# Patient Record
Sex: Male | Born: 1940
Health system: Southern US, Community
[De-identification: ages and names within clinical notes are randomized; demographics above are authoritative.]

## PROBLEM LIST (undated history)

## (undated) DIAGNOSIS — J302 Other seasonal allergic rhinitis: Secondary | ICD-10-CM

## (undated) DIAGNOSIS — K649 Unspecified hemorrhoids: Secondary | ICD-10-CM

## (undated) DIAGNOSIS — J301 Allergic rhinitis due to pollen: Secondary | ICD-10-CM

## (undated) DIAGNOSIS — Z87442 Personal history of urinary calculi: Secondary | ICD-10-CM

## (undated) DIAGNOSIS — M79643 Pain in unspecified hand: Secondary | ICD-10-CM

## (undated) DIAGNOSIS — E559 Vitamin D deficiency, unspecified: Secondary | ICD-10-CM

## (undated) DIAGNOSIS — T63441A Toxic effect of venom of bees, accidental (unintentional), initial encounter: Secondary | ICD-10-CM

## (undated) DIAGNOSIS — K219 Gastro-esophageal reflux disease without esophagitis: Secondary | ICD-10-CM

## (undated) DIAGNOSIS — L723 Sebaceous cyst: Secondary | ICD-10-CM

## (undated) DIAGNOSIS — H26229 Cataract secondary to ocular disorders (degenerative) (inflammatory), unspecified eye: Secondary | ICD-10-CM

## (undated) DIAGNOSIS — N138 Other obstructive and reflux uropathy: Secondary | ICD-10-CM

## (undated) DIAGNOSIS — N529 Male erectile dysfunction, unspecified: Secondary | ICD-10-CM

## (undated) DIAGNOSIS — I700 Atherosclerosis of aorta: Secondary | ICD-10-CM

## (undated) DIAGNOSIS — Z8709 Personal history of other diseases of the respiratory system: Secondary | ICD-10-CM

## (undated) DIAGNOSIS — Z8349 Family history of other endocrine, nutritional and metabolic diseases: Secondary | ICD-10-CM

## (undated) DIAGNOSIS — N400 Enlarged prostate without lower urinary tract symptoms: Secondary | ICD-10-CM

## (undated) DIAGNOSIS — I10 Essential (primary) hypertension: Secondary | ICD-10-CM

## (undated) DIAGNOSIS — Z79899 Other long term (current) drug therapy: Secondary | ICD-10-CM

## (undated) DIAGNOSIS — M5136 Other intervertebral disc degeneration, lumbar region: Secondary | ICD-10-CM

## (undated) DIAGNOSIS — R413 Other amnesia: Secondary | ICD-10-CM

## (undated) DIAGNOSIS — E291 Testicular hypofunction: Secondary | ICD-10-CM

## (undated) DIAGNOSIS — H3530 Unspecified macular degeneration: Secondary | ICD-10-CM

## (undated) DIAGNOSIS — R7301 Impaired fasting glucose: Secondary | ICD-10-CM

## (undated) DIAGNOSIS — E538 Deficiency of other specified B group vitamins: Secondary | ICD-10-CM

## (undated) DIAGNOSIS — Z973 Presence of spectacles and contact lenses: Secondary | ICD-10-CM

## (undated) DIAGNOSIS — E782 Mixed hyperlipidemia: Secondary | ICD-10-CM

## (undated) DIAGNOSIS — K5900 Constipation, unspecified: Secondary | ICD-10-CM

## (undated) DIAGNOSIS — B351 Tinea unguium: Secondary | ICD-10-CM

## (undated) DIAGNOSIS — R5383 Other fatigue: Secondary | ICD-10-CM

## (undated) DIAGNOSIS — M179 Osteoarthritis of knee, unspecified: Secondary | ICD-10-CM

## (undated) DIAGNOSIS — J309 Allergic rhinitis, unspecified: Secondary | ICD-10-CM

## (undated) DIAGNOSIS — Z87898 Personal history of other specified conditions: Secondary | ICD-10-CM

## (undated) DIAGNOSIS — R531 Weakness: Secondary | ICD-10-CM

## (undated) DIAGNOSIS — M1990 Unspecified osteoarthritis, unspecified site: Secondary | ICD-10-CM

## (undated) DIAGNOSIS — N200 Calculus of kidney: Secondary | ICD-10-CM

## (undated) DIAGNOSIS — R258 Other abnormal involuntary movements: Secondary | ICD-10-CM

## (undated) DIAGNOSIS — R109 Unspecified abdominal pain: Secondary | ICD-10-CM

## (undated) DIAGNOSIS — R55 Syncope and collapse: Secondary | ICD-10-CM

## (undated) DIAGNOSIS — M4722 Other spondylosis with radiculopathy, cervical region: Secondary | ICD-10-CM

## (undated) DIAGNOSIS — J329 Chronic sinusitis, unspecified: Secondary | ICD-10-CM

## (undated) DIAGNOSIS — R739 Hyperglycemia, unspecified: Secondary | ICD-10-CM

## (undated) DIAGNOSIS — T63461A Toxic effect of venom of wasps, accidental (unintentional), initial encounter: Secondary | ICD-10-CM

## (undated) DIAGNOSIS — E7800 Pure hypercholesterolemia, unspecified: Secondary | ICD-10-CM

## (undated) DIAGNOSIS — H04129 Dry eye syndrome of unspecified lacrimal gland: Secondary | ICD-10-CM

## (undated) DIAGNOSIS — R29898 Other symptoms and signs involving the musculoskeletal system: Secondary | ICD-10-CM

## (undated) DIAGNOSIS — M25519 Pain in unspecified shoulder: Secondary | ICD-10-CM

## (undated) DIAGNOSIS — H409 Unspecified glaucoma: Secondary | ICD-10-CM

## (undated) DIAGNOSIS — J029 Acute pharyngitis, unspecified: Secondary | ICD-10-CM

## (undated) DIAGNOSIS — G8929 Other chronic pain: Secondary | ICD-10-CM

## (undated) DIAGNOSIS — E785 Hyperlipidemia, unspecified: Secondary | ICD-10-CM

## (undated) DIAGNOSIS — Z0000 Encounter for general adult medical examination without abnormal findings: Secondary | ICD-10-CM

## (undated) HISTORY — DX: Osteoarthritis of knee, unspecified: M17.9

## (undated) HISTORY — DX: Other long term (current) drug therapy: Z79.899

## (undated) HISTORY — DX: Acute pharyngitis, unspecified: J02.9

## (undated) HISTORY — PX: CYSTOSCOPY WITH INSERTION OF UROLIFT: SHX6678

## (undated) HISTORY — DX: Essential (primary) hypertension: I10

## (undated) HISTORY — DX: Chronic sinusitis, unspecified: J32.9

## (undated) HISTORY — DX: Mixed hyperlipidemia: E78.2

## (undated) HISTORY — DX: Other spondylosis with radiculopathy, cervical region: M47.22

## (undated) HISTORY — DX: Other symptoms and signs involving the musculoskeletal system: R29.898

## (undated) HISTORY — DX: Unspecified glaucoma: H40.9

## (undated) HISTORY — DX: Other obstructive and reflux uropathy: N13.8

## (undated) HISTORY — DX: Hyperglycemia, unspecified: R73.9

## (undated) HISTORY — PX: CATARACT EXTRACTION W/ INTRAOCULAR LENS  IMPLANT, BILATERAL: SHX1307

## (undated) HISTORY — DX: Impaired fasting glucose: R73.01

## (undated) HISTORY — DX: Pain in unspecified hand: M79.643

## (undated) HISTORY — DX: Other fatigue: R53.83

## (undated) HISTORY — DX: Family history of other endocrine, nutritional and metabolic diseases: Z83.49

## (undated) HISTORY — PX: APPENDECTOMY: SHX54

## (undated) HISTORY — DX: Constipation, unspecified: K59.00

## (undated) HISTORY — PX: ANKLE SURGERY: SHX546

## (undated) HISTORY — DX: Allergic rhinitis, unspecified: J30.9

## (undated) HISTORY — DX: Testicular hypofunction: E29.1

## (undated) HISTORY — PX: TONSILLECTOMY: SUR1361

## (undated) HISTORY — DX: Toxic effect of venom of bees, accidental (unintentional), initial encounter: T63.441A

## (undated) HISTORY — DX: Toxic effect of venom of wasps, accidental (unintentional), initial encounter: T63.461A

## (undated) HISTORY — DX: Gastro-esophageal reflux disease without esophagitis: K21.9

## (undated) HISTORY — PX: SHOULDER SURGERY: SHX246

## (undated) HISTORY — DX: Atherosclerosis of aorta: I70.0

## (undated) HISTORY — DX: Encounter for general adult medical examination without abnormal findings: Z00.00

## (undated) HISTORY — DX: Pure hypercholesterolemia, unspecified: E78.00

## (undated) HISTORY — DX: Deficiency of other specified B group vitamins: E53.8

## (undated) HISTORY — DX: Male erectile dysfunction, unspecified: N52.9

## (undated) HISTORY — DX: Cataract secondary to ocular disorders (degenerative) (inflammatory), unspecified eye: H26.229

## (undated) HISTORY — DX: Allergic rhinitis due to pollen: J30.1

## (undated) HISTORY — DX: Unspecified abdominal pain: R10.9

## (undated) HISTORY — DX: Syncope and collapse: R55

## (undated) HISTORY — DX: Pain in unspecified shoulder: M25.519

## (undated) HISTORY — DX: Other abnormal involuntary movements: R25.8

## (undated) HISTORY — PX: CATARACT EXTRACTION, BILATERAL: SHX1313

## (undated) HISTORY — PX: PROSTATE SURGERY: SHX751

## (undated) HISTORY — DX: Unspecified osteoarthritis, unspecified site: M19.90

## (undated) HISTORY — DX: Other amnesia: R41.3

## (undated) HISTORY — DX: Vitamin D deficiency, unspecified: E55.9

## (undated) HISTORY — DX: Unspecified hemorrhoids: K64.9

## (undated) HISTORY — DX: Tinea unguium: B35.1

## (undated) HISTORY — DX: Benign prostatic hyperplasia without lower urinary tract symptoms: N40.0

## (undated) HISTORY — DX: Sebaceous cyst: L72.3

## (undated) HISTORY — DX: Calculus of kidney: N20.0

---
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
## 1998-01-27 ENCOUNTER — Ambulatory Visit (HOSPITAL_COMMUNITY): Admission: RE | Admit: 1998-01-27 | Discharge: 1998-01-27 | Payer: Self-pay | Admitting: Internal Medicine

## 2001-07-18 ENCOUNTER — Encounter: Payer: Self-pay | Admitting: Internal Medicine

## 2001-07-18 ENCOUNTER — Encounter: Admission: RE | Admit: 2001-07-18 | Discharge: 2001-07-18 | Payer: Self-pay | Admitting: Internal Medicine

## 2001-10-22 ENCOUNTER — Encounter: Admission: RE | Admit: 2001-10-22 | Discharge: 2001-10-22 | Payer: Self-pay | Admitting: Otolaryngology

## 2001-10-22 ENCOUNTER — Encounter: Payer: Self-pay | Admitting: Otolaryngology

## 2002-06-13 ENCOUNTER — Ambulatory Visit (HOSPITAL_COMMUNITY): Admission: RE | Admit: 2002-06-13 | Discharge: 2002-06-13 | Payer: Self-pay | Admitting: *Deleted

## 2002-06-13 ENCOUNTER — Encounter (INDEPENDENT_AMBULATORY_CARE_PROVIDER_SITE_OTHER): Payer: Self-pay | Admitting: Specialist

## 2003-07-23 ENCOUNTER — Ambulatory Visit (HOSPITAL_COMMUNITY): Admission: RE | Admit: 2003-07-23 | Discharge: 2003-07-23 | Payer: Self-pay | Admitting: Internal Medicine

## 2005-10-03 ENCOUNTER — Ambulatory Visit (HOSPITAL_COMMUNITY): Admission: RE | Admit: 2005-10-03 | Discharge: 2005-10-04 | Payer: Self-pay | Admitting: Orthopedic Surgery

## 2009-11-18 ENCOUNTER — Ambulatory Visit (HOSPITAL_COMMUNITY): Admission: RE | Admit: 2009-11-18 | Discharge: 2009-11-18 | Payer: Self-pay | Admitting: Gastroenterology

## 2010-12-09 NOTE — Op Note (Signed)
   NAME:  Seth Carter, Seth Carter                            ACCOUNT NO.:  [id number]   MEDICAL RECORD NO.:  [id number]                   PATIENT TYPE:  AMB   LOCATION:  ENDO                                 FACILITY:  [facility]   PHYSICIAN:  Georgiana Spinner, M.D.                 DATE OF BIRTH:  1941-01-16   DATE OF PROCEDURE:  06/13/2002  DATE OF DISCHARGE:                                 OPERATIVE REPORT   PROCEDURE:  Upper endoscopy.   INDICATIONS:  GERD.   ANESTHESIA:  Demerol 70 mg, Versed 6 mg.   DESCRIPTION OF PROCEDURE:  With the patient mildly sedated in the left  lateral decubitus position, the Olympus videoscopic endoscope was inserted  in the mouth, passed under direct vision through the esophagus, which  appeared normal, into the stomach.  Fundus, body, antrum, duodenal bulb,  second portion of the duodenum all appeared normal.  From this point the  endoscope was slowly withdrawn, taking circumferential views of the entire  duodenal mucosa until the endoscope had been pulled back into the stomach,  placed in retroflexion to view the stomach from below.  The endoscope was  then straightened and withdrawn, taking circumferential views of the entire  gastric and esophageal mucosa.  The patient's vital signs and pulse oximetry  remained stable.  The patient tolerated the procedure well without apparent  complications.   FINDINGS:  Essentially negative endoscopic examination.  The patient still  complains of reflux symptomatology on Protonix, started by Dr. Kevan Ny.   PLAN:  I will see him as an outpatient for follow-up of this problem and  proceed to colonoscopy as planned.                                               Georgiana Spinner, M.D.    GMO/MEDQ  D:  06/13/2002  T:  06/13/2002  Job:  [id number]   cc:   Candyce Churn, M.D.  [address] [place]  [place] [address]  Fax: [phone]

## 2010-12-09 NOTE — Op Note (Signed)
NAME:  Seth Carter, Seth Carter                  ACCOUNT NO.:  [id number]   MEDICAL RECORD NO.:  [id number]          PATIENT TYPE:  AMB   LOCATION:  DAY                          FACILITY:  [facility]   PHYSICIAN:  Marlowe Kays, M.D.  DATE OF BIRTH:  08-19-1940   DATE OF PROCEDURE:  10/03/2005  DATE OF DISCHARGE:                                 OPERATIVE REPORT   PREOPERATIVE DIAGNOSIS:  Chronic Achilles tendonitis secondary to posterior  os calcis osteophyte, right heel.   POSTOP DIAGNOSIS:  Chronic Achilles tendonitis secondary to posterior os  calcis osteophyte, right heel.   OPERATION:  Exploration of right posterior heel with excision of osteophyte,  debridement of Achilles tendon, and reattachment to the heel using a rotator  cuff anchor, utilizing C-arm.   SURGEON:  Marlowe Kays, M.D.   ASSISTANT:  Nurse.   ANESTHESIA:  General.   PATHOLOGIST:  __________   INDICATIONS FOR PROCEDURE:  Chronic pain and swelling of the posterior heel  with an MRI demonstrating chronic tendinopathy with some small  intrasubstance tears near the attachment site. Also edema was noted in the  surrounding calcaneus. All this was confirmed at surgery.   PROCEDURE:  Satisfactory general anesthesia, pneumatic tourniquet, right  lower extremity was Esmarched out nonsterilely; and prepped with DuraPrep  from midcalf-to-toes, and draped in a sterile field. I made a posterolateral  incision down through the Achilles tendon at that site; and with  localization using the C-arm was able to localize the major osteophyte which  was angled upward and digging into the Achilles tendon. There was a good bit  of irritative fluid present. I excised the osteophyte mainly with some small  rongeur, but slight with osteotome; and utilizing C-arm, kept working until  it all had been removed. The attachment to the Achilles tendon was somewhat  tenuous with probably partial detachment and the Achilles tendon, at this  site had  bone imbedded in it, and was very degenerated. All this diseased  tendon was debrided out. When I was satisfied, using the C-arm, that we had  removed all the abnormal bone; I reattached the Achilles tendon with a  rotator cuff anchor with the two suture arms woven through the tendon and  cinched down tightly.  I took a final lateral x-ray confirming the  reattachment and the excision of the osteophyte.  I supplemented the anchor  suture with some #1 Vicryl. The wound was irrigated well with sterile  saline, infiltrated with 1/2% plain Marcaine; and the skin and subcutaneous  tissue closed with interrupted 4-0 nylon  mattress sutures. Betadine, Adaptic, dry sterile dressing, and a well-padded  short-leg splint cast was applied. The tourniquet was released. He tolerated  the procedure well and was taken to the recovery room in satisfactory  condition with no known complications.           ______________________________  Marlowe Kays, M.D.     JA/MEDQ  D:  10/03/2005  T:  10/04/2005  Job:  [id number]

## 2010-12-09 NOTE — Op Note (Signed)
   NAME:  Seth Carter, Seth Carter                            ACCOUNT NO.:  [id number]   MEDICAL RECORD NO.:  [id number]                   PATIENT TYPE:  AMB   LOCATION:  ENDO                                 FACILITY:  [facility]   PHYSICIAN:  Georgiana Spinner, M.D.                 DATE OF BIRTH:  09/03/1940   DATE OF PROCEDURE:  DATE OF DISCHARGE:                                 OPERATIVE REPORT   PROCEDURE:  Colonoscopy.   INDICATIONS FOR PROCEDURE:  History of rectal bleeding, colon cancer  screening.   ANESTHESIA:  Demerol 30, Versed 4 mg additionally.   DESCRIPTION OF PROCEDURE:  With the patient mildly sedated in the left  lateral decubitus position, a rectal exam was performed which was  unremarkable. Subsequently, the Olympus videoscopic colonoscope was inserted  in the rectum and passed under direct vision to the cecum identified by the  ileocecal valve and appendiceal orifice the former of which was  photographed. From this point, the colonoscope was slowly withdrawn taking  circumferential views of the entire colonic mucosa stopping in the ascending  colon on the way to the rectum where there was a small polypoid area seen,  photographed and removed using hot biopsy forceps technique on a setting of  20:20 blended current. We further withdrew the colonoscope all the way to  the rectum taking circumferential views of the remaining colonic mucosa  stopping only then in the rectum which appeared normal in direct and  retroflexed view and showed hemorrhoids.  The endoscope was straightened and  withdrawn. The patient's vital signs and pulse oximeter remained stable. The  patient tolerated the procedure well without apparent complications.   FINDINGS:  Small polypoid area in the ascending colon removed, internal  hemorrhoids, otherwise, unremarkable examination.   PLAN:  Await biopsy report. The patient will call me for results and  followup with me as described above and the endoscopy  note.                                                Georgiana Spinner, M.D.    GMO/MEDQ  D:  06/13/2002  T:  06/13/2002  Job:  [id number]   cc:   Candyce Churn, M.D.  [address] [place]  [place] [address]  Fax: [phone]

## 2011-10-07 DIAGNOSIS — J019 Acute sinusitis, unspecified: Secondary | ICD-10-CM | POA: Diagnosis not present

## 2011-10-07 DIAGNOSIS — R030 Elevated blood-pressure reading, without diagnosis of hypertension: Secondary | ICD-10-CM | POA: Diagnosis not present

## 2011-10-16 DIAGNOSIS — J029 Acute pharyngitis, unspecified: Secondary | ICD-10-CM | POA: Diagnosis not present

## 2011-10-16 DIAGNOSIS — K219 Gastro-esophageal reflux disease without esophagitis: Secondary | ICD-10-CM | POA: Diagnosis not present

## 2011-11-06 DIAGNOSIS — E559 Vitamin D deficiency, unspecified: Secondary | ICD-10-CM | POA: Diagnosis not present

## 2011-11-06 DIAGNOSIS — I10 Essential (primary) hypertension: Secondary | ICD-10-CM | POA: Diagnosis not present

## 2011-11-06 DIAGNOSIS — N529 Male erectile dysfunction, unspecified: Secondary | ICD-10-CM | POA: Diagnosis not present

## 2011-11-06 DIAGNOSIS — Z79899 Other long term (current) drug therapy: Secondary | ICD-10-CM | POA: Diagnosis not present

## 2011-11-06 DIAGNOSIS — Z0000 Encounter for general adult medical examination without abnormal findings: Secondary | ICD-10-CM | POA: Diagnosis not present

## 2011-11-06 DIAGNOSIS — Z1331 Encounter for screening for depression: Secondary | ICD-10-CM | POA: Diagnosis not present

## 2011-11-06 DIAGNOSIS — IMO0002 Reserved for concepts with insufficient information to code with codable children: Secondary | ICD-10-CM | POA: Diagnosis not present

## 2011-11-06 DIAGNOSIS — N400 Enlarged prostate without lower urinary tract symptoms: Secondary | ICD-10-CM | POA: Diagnosis not present

## 2011-11-06 DIAGNOSIS — M1710 Unilateral primary osteoarthritis, unspecified knee: Secondary | ICD-10-CM | POA: Diagnosis not present

## 2011-11-06 DIAGNOSIS — E291 Testicular hypofunction: Secondary | ICD-10-CM | POA: Diagnosis not present

## 2011-11-06 DIAGNOSIS — R7301 Impaired fasting glucose: Secondary | ICD-10-CM | POA: Diagnosis not present

## 2012-01-01 DIAGNOSIS — T700XXA Otitic barotrauma, initial encounter: Secondary | ICD-10-CM | POA: Diagnosis not present

## 2012-01-03 DIAGNOSIS — T6391XA Toxic effect of contact with unspecified venomous animal, accidental (unintentional), initial encounter: Secondary | ICD-10-CM | POA: Diagnosis not present

## 2012-01-03 DIAGNOSIS — D235 Other benign neoplasm of skin of trunk: Secondary | ICD-10-CM | POA: Diagnosis not present

## 2012-01-03 DIAGNOSIS — H6500 Acute serous otitis media, unspecified ear: Secondary | ICD-10-CM | POA: Diagnosis not present

## 2012-01-03 DIAGNOSIS — H902 Conductive hearing loss, unspecified: Secondary | ICD-10-CM | POA: Diagnosis not present

## 2012-01-03 DIAGNOSIS — L820 Inflamed seborrheic keratosis: Secondary | ICD-10-CM | POA: Diagnosis not present

## 2012-05-30 DIAGNOSIS — H2510 Age-related nuclear cataract, unspecified eye: Secondary | ICD-10-CM | POA: Diagnosis not present

## 2012-05-30 DIAGNOSIS — Z961 Presence of intraocular lens: Secondary | ICD-10-CM | POA: Diagnosis not present

## 2012-05-30 DIAGNOSIS — H31009 Unspecified chorioretinal scars, unspecified eye: Secondary | ICD-10-CM | POA: Diagnosis not present

## 2012-05-30 DIAGNOSIS — D3130 Benign neoplasm of unspecified choroid: Secondary | ICD-10-CM | POA: Diagnosis not present

## 2012-06-18 DIAGNOSIS — H2510 Age-related nuclear cataract, unspecified eye: Secondary | ICD-10-CM | POA: Diagnosis not present

## 2012-06-24 DIAGNOSIS — H2510 Age-related nuclear cataract, unspecified eye: Secondary | ICD-10-CM | POA: Diagnosis not present

## 2012-08-30 DIAGNOSIS — R059 Cough, unspecified: Secondary | ICD-10-CM | POA: Diagnosis not present

## 2012-08-30 DIAGNOSIS — R05 Cough: Secondary | ICD-10-CM | POA: Diagnosis not present

## 2012-08-30 DIAGNOSIS — J209 Acute bronchitis, unspecified: Secondary | ICD-10-CM | POA: Diagnosis not present

## 2012-11-11 DIAGNOSIS — E291 Testicular hypofunction: Secondary | ICD-10-CM | POA: Diagnosis not present

## 2012-11-11 DIAGNOSIS — I10 Essential (primary) hypertension: Secondary | ICD-10-CM | POA: Diagnosis not present

## 2012-11-11 DIAGNOSIS — Z1331 Encounter for screening for depression: Secondary | ICD-10-CM | POA: Diagnosis not present

## 2012-11-11 DIAGNOSIS — IMO0002 Reserved for concepts with insufficient information to code with codable children: Secondary | ICD-10-CM | POA: Diagnosis not present

## 2012-11-11 DIAGNOSIS — M1710 Unilateral primary osteoarthritis, unspecified knee: Secondary | ICD-10-CM | POA: Diagnosis not present

## 2012-11-11 DIAGNOSIS — E559 Vitamin D deficiency, unspecified: Secondary | ICD-10-CM | POA: Diagnosis not present

## 2012-11-11 DIAGNOSIS — R7301 Impaired fasting glucose: Secondary | ICD-10-CM | POA: Diagnosis not present

## 2012-11-11 DIAGNOSIS — Z79899 Other long term (current) drug therapy: Secondary | ICD-10-CM | POA: Diagnosis not present

## 2012-11-11 DIAGNOSIS — N400 Enlarged prostate without lower urinary tract symptoms: Secondary | ICD-10-CM | POA: Diagnosis not present

## 2012-11-11 DIAGNOSIS — Z0000 Encounter for general adult medical examination without abnormal findings: Secondary | ICD-10-CM | POA: Diagnosis not present

## 2012-11-11 DIAGNOSIS — N529 Male erectile dysfunction, unspecified: Secondary | ICD-10-CM | POA: Diagnosis not present

## 2012-11-11 DIAGNOSIS — E7800 Pure hypercholesterolemia, unspecified: Secondary | ICD-10-CM | POA: Diagnosis not present

## 2012-12-11 DIAGNOSIS — N401 Enlarged prostate with lower urinary tract symptoms: Secondary | ICD-10-CM | POA: Diagnosis not present

## 2012-12-11 DIAGNOSIS — N529 Male erectile dysfunction, unspecified: Secondary | ICD-10-CM | POA: Diagnosis not present

## 2012-12-11 DIAGNOSIS — N138 Other obstructive and reflux uropathy: Secondary | ICD-10-CM | POA: Diagnosis not present

## 2013-01-09 DIAGNOSIS — M5030 Other cervical disc degeneration, unspecified cervical region: Secondary | ICD-10-CM | POA: Diagnosis not present

## 2013-02-04 DIAGNOSIS — R7309 Other abnormal glucose: Secondary | ICD-10-CM | POA: Diagnosis not present

## 2013-05-06 DIAGNOSIS — M5030 Other cervical disc degeneration, unspecified cervical region: Secondary | ICD-10-CM | POA: Diagnosis not present

## 2013-05-13 DIAGNOSIS — M5030 Other cervical disc degeneration, unspecified cervical region: Secondary | ICD-10-CM | POA: Diagnosis not present

## 2013-05-14 DIAGNOSIS — N529 Male erectile dysfunction, unspecified: Secondary | ICD-10-CM | POA: Diagnosis not present

## 2013-05-14 DIAGNOSIS — N138 Other obstructive and reflux uropathy: Secondary | ICD-10-CM | POA: Diagnosis not present

## 2013-05-14 DIAGNOSIS — N401 Enlarged prostate with lower urinary tract symptoms: Secondary | ICD-10-CM | POA: Diagnosis not present

## 2013-05-20 DIAGNOSIS — M5030 Other cervical disc degeneration, unspecified cervical region: Secondary | ICD-10-CM | POA: Diagnosis not present

## 2013-05-26 DIAGNOSIS — N401 Enlarged prostate with lower urinary tract symptoms: Secondary | ICD-10-CM | POA: Diagnosis not present

## 2013-05-26 DIAGNOSIS — N139 Obstructive and reflux uropathy, unspecified: Secondary | ICD-10-CM | POA: Diagnosis not present

## 2013-05-26 DIAGNOSIS — N138 Other obstructive and reflux uropathy: Secondary | ICD-10-CM | POA: Diagnosis not present

## 2013-06-03 DIAGNOSIS — I781 Nevus, non-neoplastic: Secondary | ICD-10-CM | POA: Diagnosis not present

## 2013-06-03 DIAGNOSIS — D1801 Hemangioma of skin and subcutaneous tissue: Secondary | ICD-10-CM | POA: Diagnosis not present

## 2013-06-03 DIAGNOSIS — L723 Sebaceous cyst: Secondary | ICD-10-CM | POA: Diagnosis not present

## 2013-06-03 DIAGNOSIS — D485 Neoplasm of uncertain behavior of skin: Secondary | ICD-10-CM | POA: Diagnosis not present

## 2013-06-03 DIAGNOSIS — L820 Inflamed seborrheic keratosis: Secondary | ICD-10-CM | POA: Diagnosis not present

## 2013-06-03 DIAGNOSIS — D235 Other benign neoplasm of skin of trunk: Secondary | ICD-10-CM | POA: Diagnosis not present

## 2013-06-23 DIAGNOSIS — N138 Other obstructive and reflux uropathy: Secondary | ICD-10-CM | POA: Diagnosis not present

## 2013-06-23 DIAGNOSIS — N139 Obstructive and reflux uropathy, unspecified: Secondary | ICD-10-CM | POA: Diagnosis not present

## 2013-06-23 DIAGNOSIS — N401 Enlarged prostate with lower urinary tract symptoms: Secondary | ICD-10-CM | POA: Diagnosis not present

## 2013-07-09 DIAGNOSIS — J019 Acute sinusitis, unspecified: Secondary | ICD-10-CM | POA: Diagnosis not present

## 2013-08-02 DIAGNOSIS — E7800 Pure hypercholesterolemia, unspecified: Secondary | ICD-10-CM | POA: Diagnosis not present

## 2013-08-02 DIAGNOSIS — J8289 Other pulmonary eosinophilia, not elsewhere classified: Secondary | ICD-10-CM | POA: Diagnosis not present

## 2013-08-02 DIAGNOSIS — I10 Essential (primary) hypertension: Secondary | ICD-10-CM | POA: Diagnosis not present

## 2013-08-02 DIAGNOSIS — J159 Unspecified bacterial pneumonia: Secondary | ICD-10-CM | POA: Diagnosis not present

## 2013-09-05 DIAGNOSIS — H02839 Dermatochalasis of unspecified eye, unspecified eyelid: Secondary | ICD-10-CM | POA: Diagnosis not present

## 2013-09-05 DIAGNOSIS — D3130 Benign neoplasm of unspecified choroid: Secondary | ICD-10-CM | POA: Diagnosis not present

## 2013-09-05 DIAGNOSIS — H3589 Other specified retinal disorders: Secondary | ICD-10-CM | POA: Diagnosis not present

## 2013-09-05 DIAGNOSIS — H43819 Vitreous degeneration, unspecified eye: Secondary | ICD-10-CM | POA: Diagnosis not present

## 2013-09-05 DIAGNOSIS — Z961 Presence of intraocular lens: Secondary | ICD-10-CM | POA: Diagnosis not present

## 2013-09-26 DIAGNOSIS — N139 Obstructive and reflux uropathy, unspecified: Secondary | ICD-10-CM | POA: Diagnosis not present

## 2013-09-26 DIAGNOSIS — N138 Other obstructive and reflux uropathy: Secondary | ICD-10-CM | POA: Diagnosis not present

## 2013-09-26 DIAGNOSIS — N529 Male erectile dysfunction, unspecified: Secondary | ICD-10-CM | POA: Diagnosis not present

## 2013-09-26 DIAGNOSIS — N401 Enlarged prostate with lower urinary tract symptoms: Secondary | ICD-10-CM | POA: Diagnosis not present

## 2013-10-14 DIAGNOSIS — D485 Neoplasm of uncertain behavior of skin: Secondary | ICD-10-CM | POA: Diagnosis not present

## 2013-10-14 DIAGNOSIS — L723 Sebaceous cyst: Secondary | ICD-10-CM | POA: Diagnosis not present

## 2013-11-14 DIAGNOSIS — N529 Male erectile dysfunction, unspecified: Secondary | ICD-10-CM | POA: Diagnosis not present

## 2013-11-14 DIAGNOSIS — M1710 Unilateral primary osteoarthritis, unspecified knee: Secondary | ICD-10-CM | POA: Diagnosis not present

## 2013-11-14 DIAGNOSIS — E291 Testicular hypofunction: Secondary | ICD-10-CM | POA: Diagnosis not present

## 2013-11-14 DIAGNOSIS — Z0000 Encounter for general adult medical examination without abnormal findings: Secondary | ICD-10-CM | POA: Diagnosis not present

## 2013-11-14 DIAGNOSIS — IMO0002 Reserved for concepts with insufficient information to code with codable children: Secondary | ICD-10-CM | POA: Diagnosis not present

## 2013-11-14 DIAGNOSIS — N400 Enlarged prostate without lower urinary tract symptoms: Secondary | ICD-10-CM | POA: Diagnosis not present

## 2013-11-14 DIAGNOSIS — M19019 Primary osteoarthritis, unspecified shoulder: Secondary | ICD-10-CM | POA: Diagnosis not present

## 2013-11-14 DIAGNOSIS — I10 Essential (primary) hypertension: Secondary | ICD-10-CM | POA: Diagnosis not present

## 2013-11-14 DIAGNOSIS — M47812 Spondylosis without myelopathy or radiculopathy, cervical region: Secondary | ICD-10-CM | POA: Diagnosis not present

## 2013-11-14 DIAGNOSIS — E7800 Pure hypercholesterolemia, unspecified: Secondary | ICD-10-CM | POA: Diagnosis not present

## 2013-11-14 DIAGNOSIS — E559 Vitamin D deficiency, unspecified: Secondary | ICD-10-CM | POA: Diagnosis not present

## 2013-11-14 DIAGNOSIS — R7301 Impaired fasting glucose: Secondary | ICD-10-CM | POA: Diagnosis not present

## 2013-11-14 DIAGNOSIS — Z23 Encounter for immunization: Secondary | ICD-10-CM | POA: Diagnosis not present

## 2013-11-14 DIAGNOSIS — Z1331 Encounter for screening for depression: Secondary | ICD-10-CM | POA: Diagnosis not present

## 2013-12-24 DIAGNOSIS — S62319A Displaced fracture of base of unspecified metacarpal bone, initial encounter for closed fracture: Secondary | ICD-10-CM | POA: Diagnosis not present

## 2014-01-05 DIAGNOSIS — S5290XD Unspecified fracture of unspecified forearm, subsequent encounter for closed fracture with routine healing: Secondary | ICD-10-CM | POA: Diagnosis not present

## 2014-01-28 DIAGNOSIS — S5290XD Unspecified fracture of unspecified forearm, subsequent encounter for closed fracture with routine healing: Secondary | ICD-10-CM | POA: Diagnosis not present

## 2014-04-29 DIAGNOSIS — N5201 Erectile dysfunction due to arterial insufficiency: Secondary | ICD-10-CM | POA: Diagnosis not present

## 2014-04-29 DIAGNOSIS — N401 Enlarged prostate with lower urinary tract symptoms: Secondary | ICD-10-CM | POA: Diagnosis not present

## 2014-04-29 DIAGNOSIS — R350 Frequency of micturition: Secondary | ICD-10-CM | POA: Diagnosis not present

## 2014-05-26 DIAGNOSIS — Z23 Encounter for immunization: Secondary | ICD-10-CM | POA: Diagnosis not present

## 2014-06-11 DIAGNOSIS — M65842 Other synovitis and tenosynovitis, left hand: Secondary | ICD-10-CM | POA: Diagnosis not present

## 2014-06-11 DIAGNOSIS — S62317D Displaced fracture of base of fifth metacarpal bone. left hand, subsequent encounter for fracture with routine healing: Secondary | ICD-10-CM | POA: Diagnosis not present

## 2014-07-09 DIAGNOSIS — H43393 Other vitreous opacities, bilateral: Secondary | ICD-10-CM | POA: Diagnosis not present

## 2014-07-09 DIAGNOSIS — H02831 Dermatochalasis of right upper eyelid: Secondary | ICD-10-CM | POA: Diagnosis not present

## 2014-07-09 DIAGNOSIS — H43813 Vitreous degeneration, bilateral: Secondary | ICD-10-CM | POA: Diagnosis not present

## 2014-07-09 DIAGNOSIS — H33322 Round hole, left eye: Secondary | ICD-10-CM | POA: Diagnosis not present

## 2014-07-09 DIAGNOSIS — H3562 Retinal hemorrhage, left eye: Secondary | ICD-10-CM | POA: Diagnosis not present

## 2014-07-09 DIAGNOSIS — H02834 Dermatochalasis of left upper eyelid: Secondary | ICD-10-CM | POA: Diagnosis not present

## 2014-07-09 DIAGNOSIS — D3131 Benign neoplasm of right choroid: Secondary | ICD-10-CM | POA: Diagnosis not present

## 2014-07-09 DIAGNOSIS — Z961 Presence of intraocular lens: Secondary | ICD-10-CM | POA: Diagnosis not present

## 2014-07-10 ENCOUNTER — Encounter (INDEPENDENT_AMBULATORY_CARE_PROVIDER_SITE_OTHER): Payer: Medicare Other | Admitting: Ophthalmology

## 2014-07-10 DIAGNOSIS — D3131 Benign neoplasm of right choroid: Secondary | ICD-10-CM

## 2014-07-10 DIAGNOSIS — H33012 Retinal detachment with single break, left eye: Secondary | ICD-10-CM | POA: Diagnosis not present

## 2014-07-10 DIAGNOSIS — I10 Essential (primary) hypertension: Secondary | ICD-10-CM

## 2014-07-10 DIAGNOSIS — H3531 Nonexudative age-related macular degeneration: Secondary | ICD-10-CM

## 2014-07-10 DIAGNOSIS — H43813 Vitreous degeneration, bilateral: Secondary | ICD-10-CM | POA: Diagnosis not present

## 2014-07-10 DIAGNOSIS — H35033 Hypertensive retinopathy, bilateral: Secondary | ICD-10-CM

## 2014-07-15 ENCOUNTER — Ambulatory Visit (INDEPENDENT_AMBULATORY_CARE_PROVIDER_SITE_OTHER): Payer: Medicare Other | Admitting: Ophthalmology

## 2014-07-15 DIAGNOSIS — H33302 Unspecified retinal break, left eye: Secondary | ICD-10-CM

## 2014-11-04 DIAGNOSIS — H43811 Vitreous degeneration, right eye: Secondary | ICD-10-CM | POA: Diagnosis not present

## 2014-11-04 DIAGNOSIS — H31092 Other chorioretinal scars, left eye: Secondary | ICD-10-CM | POA: Diagnosis not present

## 2014-11-04 DIAGNOSIS — D3131 Benign neoplasm of right choroid: Secondary | ICD-10-CM | POA: Diagnosis not present

## 2014-11-04 DIAGNOSIS — H02834 Dermatochalasis of left upper eyelid: Secondary | ICD-10-CM | POA: Diagnosis not present

## 2014-11-04 DIAGNOSIS — H02831 Dermatochalasis of right upper eyelid: Secondary | ICD-10-CM | POA: Diagnosis not present

## 2014-11-04 DIAGNOSIS — Z961 Presence of intraocular lens: Secondary | ICD-10-CM | POA: Diagnosis not present

## 2014-11-16 ENCOUNTER — Ambulatory Visit (INDEPENDENT_AMBULATORY_CARE_PROVIDER_SITE_OTHER): Payer: Medicare Other | Admitting: Ophthalmology

## 2014-11-16 DIAGNOSIS — H35033 Hypertensive retinopathy, bilateral: Secondary | ICD-10-CM | POA: Diagnosis not present

## 2014-11-16 DIAGNOSIS — H3531 Nonexudative age-related macular degeneration: Secondary | ICD-10-CM

## 2014-11-16 DIAGNOSIS — I10 Essential (primary) hypertension: Secondary | ICD-10-CM

## 2014-11-16 DIAGNOSIS — H43813 Vitreous degeneration, bilateral: Secondary | ICD-10-CM

## 2014-11-16 DIAGNOSIS — H33302 Unspecified retinal break, left eye: Secondary | ICD-10-CM | POA: Diagnosis not present

## 2014-12-04 DIAGNOSIS — J309 Allergic rhinitis, unspecified: Secondary | ICD-10-CM | POA: Diagnosis not present

## 2014-12-04 DIAGNOSIS — E782 Mixed hyperlipidemia: Secondary | ICD-10-CM | POA: Diagnosis not present

## 2014-12-04 DIAGNOSIS — Z79899 Other long term (current) drug therapy: Secondary | ICD-10-CM | POA: Diagnosis not present

## 2014-12-04 DIAGNOSIS — N529 Male erectile dysfunction, unspecified: Secondary | ICD-10-CM | POA: Diagnosis not present

## 2014-12-04 DIAGNOSIS — Z0001 Encounter for general adult medical examination with abnormal findings: Secondary | ICD-10-CM | POA: Diagnosis not present

## 2014-12-04 DIAGNOSIS — Z1389 Encounter for screening for other disorder: Secondary | ICD-10-CM | POA: Diagnosis not present

## 2014-12-04 DIAGNOSIS — E559 Vitamin D deficiency, unspecified: Secondary | ICD-10-CM | POA: Diagnosis not present

## 2014-12-04 DIAGNOSIS — I10 Essential (primary) hypertension: Secondary | ICD-10-CM | POA: Diagnosis not present

## 2014-12-04 DIAGNOSIS — E291 Testicular hypofunction: Secondary | ICD-10-CM | POA: Diagnosis not present

## 2014-12-04 DIAGNOSIS — N400 Enlarged prostate without lower urinary tract symptoms: Secondary | ICD-10-CM | POA: Diagnosis not present

## 2014-12-04 DIAGNOSIS — L723 Sebaceous cyst: Secondary | ICD-10-CM | POA: Diagnosis not present

## 2015-02-16 DIAGNOSIS — L723 Sebaceous cyst: Secondary | ICD-10-CM | POA: Diagnosis not present

## 2015-02-16 DIAGNOSIS — R739 Hyperglycemia, unspecified: Secondary | ICD-10-CM | POA: Diagnosis not present

## 2015-02-24 DIAGNOSIS — Z4802 Encounter for removal of sutures: Secondary | ICD-10-CM | POA: Diagnosis not present

## 2015-05-20 DIAGNOSIS — H02831 Dermatochalasis of right upper eyelid: Secondary | ICD-10-CM | POA: Diagnosis not present

## 2015-05-20 DIAGNOSIS — H02834 Dermatochalasis of left upper eyelid: Secondary | ICD-10-CM | POA: Diagnosis not present

## 2015-06-28 DIAGNOSIS — Z1283 Encounter for screening for malignant neoplasm of skin: Secondary | ICD-10-CM | POA: Diagnosis not present

## 2015-08-02 DIAGNOSIS — H02834 Dermatochalasis of left upper eyelid: Secondary | ICD-10-CM | POA: Diagnosis not present

## 2015-08-02 DIAGNOSIS — H53453 Other localized visual field defect, bilateral: Secondary | ICD-10-CM | POA: Diagnosis not present

## 2015-08-02 DIAGNOSIS — H02831 Dermatochalasis of right upper eyelid: Secondary | ICD-10-CM | POA: Diagnosis not present

## 2015-08-27 DIAGNOSIS — H43811 Vitreous degeneration, right eye: Secondary | ICD-10-CM | POA: Diagnosis not present

## 2015-08-27 DIAGNOSIS — Z961 Presence of intraocular lens: Secondary | ICD-10-CM | POA: Diagnosis not present

## 2015-08-27 DIAGNOSIS — D3131 Benign neoplasm of right choroid: Secondary | ICD-10-CM | POA: Diagnosis not present

## 2015-08-27 DIAGNOSIS — H40051 Ocular hypertension, right eye: Secondary | ICD-10-CM | POA: Diagnosis not present

## 2015-08-27 DIAGNOSIS — H31092 Other chorioretinal scars, left eye: Secondary | ICD-10-CM | POA: Diagnosis not present

## 2015-09-07 DIAGNOSIS — M4690 Unspecified inflammatory spondylopathy, site unspecified: Secondary | ICD-10-CM | POA: Diagnosis not present

## 2015-11-16 ENCOUNTER — Ambulatory Visit (INDEPENDENT_AMBULATORY_CARE_PROVIDER_SITE_OTHER): Payer: Medicare Other | Admitting: Ophthalmology

## 2015-11-16 DIAGNOSIS — I10 Essential (primary) hypertension: Secondary | ICD-10-CM | POA: Diagnosis not present

## 2015-11-16 DIAGNOSIS — H43813 Vitreous degeneration, bilateral: Secondary | ICD-10-CM

## 2015-11-16 DIAGNOSIS — H33302 Unspecified retinal break, left eye: Secondary | ICD-10-CM | POA: Diagnosis not present

## 2015-11-16 DIAGNOSIS — H35033 Hypertensive retinopathy, bilateral: Secondary | ICD-10-CM

## 2015-11-16 DIAGNOSIS — D3131 Benign neoplasm of right choroid: Secondary | ICD-10-CM | POA: Diagnosis not present

## 2015-11-16 DIAGNOSIS — H353132 Nonexudative age-related macular degeneration, bilateral, intermediate dry stage: Secondary | ICD-10-CM | POA: Diagnosis not present

## 2015-12-28 DIAGNOSIS — Z0001 Encounter for general adult medical examination with abnormal findings: Secondary | ICD-10-CM | POA: Diagnosis not present

## 2015-12-28 DIAGNOSIS — R5383 Other fatigue: Secondary | ICD-10-CM | POA: Diagnosis not present

## 2015-12-28 DIAGNOSIS — Z79899 Other long term (current) drug therapy: Secondary | ICD-10-CM | POA: Diagnosis not present

## 2015-12-28 DIAGNOSIS — N400 Enlarged prostate without lower urinary tract symptoms: Secondary | ICD-10-CM | POA: Diagnosis not present

## 2015-12-28 DIAGNOSIS — E291 Testicular hypofunction: Secondary | ICD-10-CM | POA: Diagnosis not present

## 2015-12-28 DIAGNOSIS — I10 Essential (primary) hypertension: Secondary | ICD-10-CM | POA: Diagnosis not present

## 2015-12-28 DIAGNOSIS — K649 Unspecified hemorrhoids: Secondary | ICD-10-CM | POA: Diagnosis not present

## 2015-12-28 DIAGNOSIS — E782 Mixed hyperlipidemia: Secondary | ICD-10-CM | POA: Diagnosis not present

## 2015-12-28 DIAGNOSIS — R7301 Impaired fasting glucose: Secondary | ICD-10-CM | POA: Diagnosis not present

## 2015-12-28 DIAGNOSIS — H26229 Cataract secondary to ocular disorders (degenerative) (inflammatory), unspecified eye: Secondary | ICD-10-CM | POA: Diagnosis not present

## 2015-12-28 DIAGNOSIS — Z1389 Encounter for screening for other disorder: Secondary | ICD-10-CM | POA: Diagnosis not present

## 2016-02-18 DIAGNOSIS — B351 Tinea unguium: Secondary | ICD-10-CM | POA: Diagnosis not present

## 2016-02-18 DIAGNOSIS — Z79899 Other long term (current) drug therapy: Secondary | ICD-10-CM | POA: Diagnosis not present

## 2016-03-20 DIAGNOSIS — B351 Tinea unguium: Secondary | ICD-10-CM | POA: Diagnosis not present

## 2016-03-20 DIAGNOSIS — Z79899 Other long term (current) drug therapy: Secondary | ICD-10-CM | POA: Diagnosis not present

## 2016-06-23 DIAGNOSIS — Z23 Encounter for immunization: Secondary | ICD-10-CM | POA: Diagnosis not present

## 2016-06-23 DIAGNOSIS — Z79899 Other long term (current) drug therapy: Secondary | ICD-10-CM | POA: Diagnosis not present

## 2016-06-23 DIAGNOSIS — I10 Essential (primary) hypertension: Secondary | ICD-10-CM | POA: Diagnosis not present

## 2016-06-23 DIAGNOSIS — N400 Enlarged prostate without lower urinary tract symptoms: Secondary | ICD-10-CM | POA: Diagnosis not present

## 2016-06-23 DIAGNOSIS — N529 Male erectile dysfunction, unspecified: Secondary | ICD-10-CM | POA: Diagnosis not present

## 2016-08-02 DIAGNOSIS — J329 Chronic sinusitis, unspecified: Secondary | ICD-10-CM | POA: Diagnosis not present

## 2016-08-02 DIAGNOSIS — B9789 Other viral agents as the cause of diseases classified elsewhere: Secondary | ICD-10-CM | POA: Diagnosis not present

## 2016-08-21 DIAGNOSIS — N5201 Erectile dysfunction due to arterial insufficiency: Secondary | ICD-10-CM | POA: Diagnosis not present

## 2016-08-21 DIAGNOSIS — R350 Frequency of micturition: Secondary | ICD-10-CM | POA: Diagnosis not present

## 2016-08-21 DIAGNOSIS — N401 Enlarged prostate with lower urinary tract symptoms: Secondary | ICD-10-CM | POA: Diagnosis not present

## 2016-08-30 DIAGNOSIS — H43813 Vitreous degeneration, bilateral: Secondary | ICD-10-CM | POA: Diagnosis not present

## 2016-08-30 DIAGNOSIS — H40051 Ocular hypertension, right eye: Secondary | ICD-10-CM | POA: Diagnosis not present

## 2016-08-30 DIAGNOSIS — H353131 Nonexudative age-related macular degeneration, bilateral, early dry stage: Secondary | ICD-10-CM | POA: Diagnosis not present

## 2016-08-30 DIAGNOSIS — H04123 Dry eye syndrome of bilateral lacrimal glands: Secondary | ICD-10-CM | POA: Diagnosis not present

## 2016-08-30 DIAGNOSIS — H47091 Other disorders of optic nerve, not elsewhere classified, right eye: Secondary | ICD-10-CM | POA: Diagnosis not present

## 2016-08-30 DIAGNOSIS — D3131 Benign neoplasm of right choroid: Secondary | ICD-10-CM | POA: Diagnosis not present

## 2016-08-30 DIAGNOSIS — Z961 Presence of intraocular lens: Secondary | ICD-10-CM | POA: Diagnosis not present

## 2016-08-30 DIAGNOSIS — H31092 Other chorioretinal scars, left eye: Secondary | ICD-10-CM | POA: Diagnosis not present

## 2016-11-15 ENCOUNTER — Ambulatory Visit (INDEPENDENT_AMBULATORY_CARE_PROVIDER_SITE_OTHER): Payer: Medicare Other | Admitting: Ophthalmology

## 2016-11-17 DIAGNOSIS — Z8349 Family history of other endocrine, nutritional and metabolic diseases: Secondary | ICD-10-CM | POA: Diagnosis not present

## 2016-11-27 DIAGNOSIS — N5201 Erectile dysfunction due to arterial insufficiency: Secondary | ICD-10-CM | POA: Diagnosis not present

## 2016-12-25 ENCOUNTER — Ambulatory Visit (INDEPENDENT_AMBULATORY_CARE_PROVIDER_SITE_OTHER): Payer: Medicare Other | Admitting: Ophthalmology

## 2016-12-25 DIAGNOSIS — H33302 Unspecified retinal break, left eye: Secondary | ICD-10-CM | POA: Diagnosis not present

## 2016-12-25 DIAGNOSIS — H43813 Vitreous degeneration, bilateral: Secondary | ICD-10-CM | POA: Diagnosis not present

## 2016-12-25 DIAGNOSIS — H59032 Cystoid macular edema following cataract surgery, left eye: Secondary | ICD-10-CM

## 2016-12-25 DIAGNOSIS — H35033 Hypertensive retinopathy, bilateral: Secondary | ICD-10-CM

## 2016-12-25 DIAGNOSIS — H353121 Nonexudative age-related macular degeneration, left eye, early dry stage: Secondary | ICD-10-CM | POA: Diagnosis not present

## 2016-12-25 DIAGNOSIS — D3131 Benign neoplasm of right choroid: Secondary | ICD-10-CM | POA: Diagnosis not present

## 2016-12-25 DIAGNOSIS — I10 Essential (primary) hypertension: Secondary | ICD-10-CM

## 2016-12-25 DIAGNOSIS — H353112 Nonexudative age-related macular degeneration, right eye, intermediate dry stage: Secondary | ICD-10-CM | POA: Diagnosis not present

## 2016-12-28 DIAGNOSIS — N200 Calculus of kidney: Secondary | ICD-10-CM | POA: Diagnosis not present

## 2016-12-28 DIAGNOSIS — Z79899 Other long term (current) drug therapy: Secondary | ICD-10-CM | POA: Diagnosis not present

## 2016-12-28 DIAGNOSIS — Z8349 Family history of other endocrine, nutritional and metabolic diseases: Secondary | ICD-10-CM | POA: Diagnosis not present

## 2016-12-28 DIAGNOSIS — Z0000 Encounter for general adult medical examination without abnormal findings: Secondary | ICD-10-CM | POA: Diagnosis not present

## 2016-12-28 DIAGNOSIS — E559 Vitamin D deficiency, unspecified: Secondary | ICD-10-CM | POA: Diagnosis not present

## 2016-12-28 DIAGNOSIS — N529 Male erectile dysfunction, unspecified: Secondary | ICD-10-CM | POA: Diagnosis not present

## 2016-12-28 DIAGNOSIS — K219 Gastro-esophageal reflux disease without esophagitis: Secondary | ICD-10-CM | POA: Diagnosis not present

## 2016-12-28 DIAGNOSIS — I10 Essential (primary) hypertension: Secondary | ICD-10-CM | POA: Diagnosis not present

## 2016-12-28 DIAGNOSIS — E291 Testicular hypofunction: Secondary | ICD-10-CM | POA: Diagnosis not present

## 2016-12-28 DIAGNOSIS — E782 Mixed hyperlipidemia: Secondary | ICD-10-CM | POA: Diagnosis not present

## 2016-12-28 DIAGNOSIS — Z1389 Encounter for screening for other disorder: Secondary | ICD-10-CM | POA: Diagnosis not present

## 2016-12-28 DIAGNOSIS — N401 Enlarged prostate with lower urinary tract symptoms: Secondary | ICD-10-CM | POA: Diagnosis not present

## 2017-01-10 DIAGNOSIS — D3131 Benign neoplasm of right choroid: Secondary | ICD-10-CM | POA: Diagnosis not present

## 2017-01-10 DIAGNOSIS — H353121 Nonexudative age-related macular degeneration, left eye, early dry stage: Secondary | ICD-10-CM | POA: Diagnosis not present

## 2017-01-10 DIAGNOSIS — H401112 Primary open-angle glaucoma, right eye, moderate stage: Secondary | ICD-10-CM | POA: Diagnosis not present

## 2017-01-10 DIAGNOSIS — H35352 Cystoid macular degeneration, left eye: Secondary | ICD-10-CM | POA: Diagnosis not present

## 2017-01-10 DIAGNOSIS — Z961 Presence of intraocular lens: Secondary | ICD-10-CM | POA: Diagnosis not present

## 2017-01-10 DIAGNOSIS — H353112 Nonexudative age-related macular degeneration, right eye, intermediate dry stage: Secondary | ICD-10-CM | POA: Diagnosis not present

## 2017-01-10 DIAGNOSIS — H04123 Dry eye syndrome of bilateral lacrimal glands: Secondary | ICD-10-CM | POA: Diagnosis not present

## 2017-01-10 DIAGNOSIS — H401121 Primary open-angle glaucoma, left eye, mild stage: Secondary | ICD-10-CM | POA: Diagnosis not present

## 2017-01-25 DIAGNOSIS — Z1211 Encounter for screening for malignant neoplasm of colon: Secondary | ICD-10-CM | POA: Diagnosis not present

## 2017-01-25 DIAGNOSIS — Z1212 Encounter for screening for malignant neoplasm of rectum: Secondary | ICD-10-CM | POA: Diagnosis not present

## 2017-02-05 ENCOUNTER — Encounter (INDEPENDENT_AMBULATORY_CARE_PROVIDER_SITE_OTHER): Payer: Medicare Other | Admitting: Ophthalmology

## 2017-02-05 DIAGNOSIS — H35033 Hypertensive retinopathy, bilateral: Secondary | ICD-10-CM | POA: Diagnosis not present

## 2017-02-05 DIAGNOSIS — D3131 Benign neoplasm of right choroid: Secondary | ICD-10-CM

## 2017-02-05 DIAGNOSIS — H59032 Cystoid macular edema following cataract surgery, left eye: Secondary | ICD-10-CM

## 2017-02-05 DIAGNOSIS — I10 Essential (primary) hypertension: Secondary | ICD-10-CM | POA: Diagnosis not present

## 2017-02-05 DIAGNOSIS — H353112 Nonexudative age-related macular degeneration, right eye, intermediate dry stage: Secondary | ICD-10-CM

## 2017-02-05 DIAGNOSIS — H43813 Vitreous degeneration, bilateral: Secondary | ICD-10-CM | POA: Diagnosis not present

## 2017-02-05 DIAGNOSIS — H33302 Unspecified retinal break, left eye: Secondary | ICD-10-CM | POA: Diagnosis not present

## 2017-04-09 ENCOUNTER — Encounter (INDEPENDENT_AMBULATORY_CARE_PROVIDER_SITE_OTHER): Payer: Medicare Other | Admitting: Ophthalmology

## 2017-04-09 DIAGNOSIS — H353132 Nonexudative age-related macular degeneration, bilateral, intermediate dry stage: Secondary | ICD-10-CM | POA: Diagnosis not present

## 2017-04-09 DIAGNOSIS — H43813 Vitreous degeneration, bilateral: Secondary | ICD-10-CM

## 2017-04-09 DIAGNOSIS — H35033 Hypertensive retinopathy, bilateral: Secondary | ICD-10-CM

## 2017-04-09 DIAGNOSIS — I10 Essential (primary) hypertension: Secondary | ICD-10-CM | POA: Diagnosis not present

## 2017-04-09 DIAGNOSIS — D3131 Benign neoplasm of right choroid: Secondary | ICD-10-CM

## 2017-04-09 DIAGNOSIS — H35352 Cystoid macular degeneration, left eye: Secondary | ICD-10-CM

## 2017-04-18 DIAGNOSIS — H401121 Primary open-angle glaucoma, left eye, mild stage: Secondary | ICD-10-CM | POA: Diagnosis not present

## 2017-04-18 DIAGNOSIS — H401112 Primary open-angle glaucoma, right eye, moderate stage: Secondary | ICD-10-CM | POA: Diagnosis not present

## 2017-05-15 DIAGNOSIS — Z23 Encounter for immunization: Secondary | ICD-10-CM | POA: Diagnosis not present

## 2017-06-18 DIAGNOSIS — L820 Inflamed seborrheic keratosis: Secondary | ICD-10-CM | POA: Diagnosis not present

## 2017-06-18 DIAGNOSIS — Z1283 Encounter for screening for malignant neoplasm of skin: Secondary | ICD-10-CM | POA: Diagnosis not present

## 2017-07-09 ENCOUNTER — Encounter (INDEPENDENT_AMBULATORY_CARE_PROVIDER_SITE_OTHER): Payer: Medicare Other | Admitting: Ophthalmology

## 2017-07-09 DIAGNOSIS — H35033 Hypertensive retinopathy, bilateral: Secondary | ICD-10-CM | POA: Diagnosis not present

## 2017-07-09 DIAGNOSIS — H59032 Cystoid macular edema following cataract surgery, left eye: Secondary | ICD-10-CM

## 2017-07-09 DIAGNOSIS — H33302 Unspecified retinal break, left eye: Secondary | ICD-10-CM | POA: Diagnosis not present

## 2017-07-09 DIAGNOSIS — H353132 Nonexudative age-related macular degeneration, bilateral, intermediate dry stage: Secondary | ICD-10-CM

## 2017-07-09 DIAGNOSIS — I10 Essential (primary) hypertension: Secondary | ICD-10-CM | POA: Diagnosis not present

## 2017-07-09 DIAGNOSIS — H43813 Vitreous degeneration, bilateral: Secondary | ICD-10-CM

## 2017-07-09 DIAGNOSIS — D3131 Benign neoplasm of right choroid: Secondary | ICD-10-CM

## 2017-07-27 DIAGNOSIS — H401112 Primary open-angle glaucoma, right eye, moderate stage: Secondary | ICD-10-CM | POA: Diagnosis not present

## 2017-08-17 DIAGNOSIS — H401121 Primary open-angle glaucoma, left eye, mild stage: Secondary | ICD-10-CM | POA: Diagnosis not present

## 2017-10-17 DIAGNOSIS — H353131 Nonexudative age-related macular degeneration, bilateral, early dry stage: Secondary | ICD-10-CM | POA: Diagnosis not present

## 2017-10-17 DIAGNOSIS — Z961 Presence of intraocular lens: Secondary | ICD-10-CM | POA: Diagnosis not present

## 2017-10-17 DIAGNOSIS — H401131 Primary open-angle glaucoma, bilateral, mild stage: Secondary | ICD-10-CM | POA: Diagnosis not present

## 2017-10-17 DIAGNOSIS — H04123 Dry eye syndrome of bilateral lacrimal glands: Secondary | ICD-10-CM | POA: Diagnosis not present

## 2018-01-08 DIAGNOSIS — E291 Testicular hypofunction: Secondary | ICD-10-CM | POA: Diagnosis not present

## 2018-01-08 DIAGNOSIS — E559 Vitamin D deficiency, unspecified: Secondary | ICD-10-CM | POA: Diagnosis not present

## 2018-01-08 DIAGNOSIS — I10 Essential (primary) hypertension: Secondary | ICD-10-CM | POA: Diagnosis not present

## 2018-01-08 DIAGNOSIS — Z0000 Encounter for general adult medical examination without abnormal findings: Secondary | ICD-10-CM | POA: Diagnosis not present

## 2018-01-08 DIAGNOSIS — Z79899 Other long term (current) drug therapy: Secondary | ICD-10-CM | POA: Diagnosis not present

## 2018-01-08 DIAGNOSIS — B351 Tinea unguium: Secondary | ICD-10-CM | POA: Diagnosis not present

## 2018-01-08 DIAGNOSIS — K5900 Constipation, unspecified: Secondary | ICD-10-CM | POA: Diagnosis not present

## 2018-01-08 DIAGNOSIS — Z8349 Family history of other endocrine, nutritional and metabolic diseases: Secondary | ICD-10-CM | POA: Diagnosis not present

## 2018-01-08 DIAGNOSIS — N401 Enlarged prostate with lower urinary tract symptoms: Secondary | ICD-10-CM | POA: Diagnosis not present

## 2018-01-08 DIAGNOSIS — E782 Mixed hyperlipidemia: Secondary | ICD-10-CM | POA: Diagnosis not present

## 2018-01-08 DIAGNOSIS — Z1389 Encounter for screening for other disorder: Secondary | ICD-10-CM | POA: Diagnosis not present

## 2018-01-08 DIAGNOSIS — N529 Male erectile dysfunction, unspecified: Secondary | ICD-10-CM | POA: Diagnosis not present

## 2018-01-08 DIAGNOSIS — J309 Allergic rhinitis, unspecified: Secondary | ICD-10-CM | POA: Diagnosis not present

## 2018-02-21 DIAGNOSIS — R739 Hyperglycemia, unspecified: Secondary | ICD-10-CM | POA: Diagnosis not present

## 2018-04-26 DIAGNOSIS — Z23 Encounter for immunization: Secondary | ICD-10-CM | POA: Diagnosis not present

## 2018-05-01 DIAGNOSIS — N401 Enlarged prostate with lower urinary tract symptoms: Secondary | ICD-10-CM | POA: Diagnosis not present

## 2018-05-01 DIAGNOSIS — E291 Testicular hypofunction: Secondary | ICD-10-CM | POA: Diagnosis not present

## 2018-05-01 DIAGNOSIS — R350 Frequency of micturition: Secondary | ICD-10-CM | POA: Diagnosis not present

## 2018-05-01 DIAGNOSIS — N5201 Erectile dysfunction due to arterial insufficiency: Secondary | ICD-10-CM | POA: Diagnosis not present

## 2018-05-07 DIAGNOSIS — E291 Testicular hypofunction: Secondary | ICD-10-CM | POA: Diagnosis not present

## 2018-05-07 DIAGNOSIS — R948 Abnormal results of function studies of other organs and systems: Secondary | ICD-10-CM | POA: Diagnosis not present

## 2018-05-22 DIAGNOSIS — H353131 Nonexudative age-related macular degeneration, bilateral, early dry stage: Secondary | ICD-10-CM | POA: Diagnosis not present

## 2018-05-22 DIAGNOSIS — H04123 Dry eye syndrome of bilateral lacrimal glands: Secondary | ICD-10-CM | POA: Diagnosis not present

## 2018-05-22 DIAGNOSIS — Z961 Presence of intraocular lens: Secondary | ICD-10-CM | POA: Diagnosis not present

## 2018-05-22 DIAGNOSIS — H401131 Primary open-angle glaucoma, bilateral, mild stage: Secondary | ICD-10-CM | POA: Diagnosis not present

## 2018-07-01 DIAGNOSIS — N5201 Erectile dysfunction due to arterial insufficiency: Secondary | ICD-10-CM | POA: Diagnosis not present

## 2018-07-01 DIAGNOSIS — R351 Nocturia: Secondary | ICD-10-CM | POA: Diagnosis not present

## 2018-07-01 DIAGNOSIS — N401 Enlarged prostate with lower urinary tract symptoms: Secondary | ICD-10-CM | POA: Diagnosis not present

## 2018-07-01 DIAGNOSIS — E291 Testicular hypofunction: Secondary | ICD-10-CM | POA: Diagnosis not present

## 2018-07-11 ENCOUNTER — Encounter (INDEPENDENT_AMBULATORY_CARE_PROVIDER_SITE_OTHER): Payer: Medicare Other | Admitting: Ophthalmology

## 2018-10-14 DIAGNOSIS — N401 Enlarged prostate with lower urinary tract symptoms: Secondary | ICD-10-CM | POA: Diagnosis not present

## 2018-10-14 DIAGNOSIS — R351 Nocturia: Secondary | ICD-10-CM | POA: Diagnosis not present

## 2018-11-29 DIAGNOSIS — E291 Testicular hypofunction: Secondary | ICD-10-CM | POA: Diagnosis not present

## 2018-11-29 DIAGNOSIS — N401 Enlarged prostate with lower urinary tract symptoms: Secondary | ICD-10-CM | POA: Diagnosis not present

## 2018-11-29 DIAGNOSIS — N5201 Erectile dysfunction due to arterial insufficiency: Secondary | ICD-10-CM | POA: Diagnosis not present

## 2018-11-29 DIAGNOSIS — R351 Nocturia: Secondary | ICD-10-CM | POA: Diagnosis not present

## 2019-01-14 DIAGNOSIS — J309 Allergic rhinitis, unspecified: Secondary | ICD-10-CM | POA: Diagnosis not present

## 2019-01-14 DIAGNOSIS — N401 Enlarged prostate with lower urinary tract symptoms: Secondary | ICD-10-CM | POA: Diagnosis not present

## 2019-01-14 DIAGNOSIS — I10 Essential (primary) hypertension: Secondary | ICD-10-CM | POA: Diagnosis not present

## 2019-01-14 DIAGNOSIS — Z0000 Encounter for general adult medical examination without abnormal findings: Secondary | ICD-10-CM | POA: Diagnosis not present

## 2019-01-14 DIAGNOSIS — E559 Vitamin D deficiency, unspecified: Secondary | ICD-10-CM | POA: Diagnosis not present

## 2019-01-14 DIAGNOSIS — E782 Mixed hyperlipidemia: Secondary | ICD-10-CM | POA: Diagnosis not present

## 2019-01-14 DIAGNOSIS — K219 Gastro-esophageal reflux disease without esophagitis: Secondary | ICD-10-CM | POA: Diagnosis not present

## 2019-01-14 DIAGNOSIS — K5900 Constipation, unspecified: Secondary | ICD-10-CM | POA: Diagnosis not present

## 2019-01-14 DIAGNOSIS — E291 Testicular hypofunction: Secondary | ICD-10-CM | POA: Diagnosis not present

## 2019-01-14 DIAGNOSIS — Z79899 Other long term (current) drug therapy: Secondary | ICD-10-CM | POA: Diagnosis not present

## 2019-01-29 DIAGNOSIS — M25519 Pain in unspecified shoulder: Secondary | ICD-10-CM | POA: Insufficient documentation

## 2019-01-29 DIAGNOSIS — M25512 Pain in left shoulder: Secondary | ICD-10-CM | POA: Diagnosis not present

## 2019-01-29 DIAGNOSIS — M7542 Impingement syndrome of left shoulder: Secondary | ICD-10-CM | POA: Diagnosis not present

## 2019-03-10 DIAGNOSIS — H532 Diplopia: Secondary | ICD-10-CM | POA: Diagnosis not present

## 2019-03-10 DIAGNOSIS — H353111 Nonexudative age-related macular degeneration, right eye, early dry stage: Secondary | ICD-10-CM | POA: Diagnosis not present

## 2019-03-10 DIAGNOSIS — Z961 Presence of intraocular lens: Secondary | ICD-10-CM | POA: Diagnosis not present

## 2019-03-10 DIAGNOSIS — H401121 Primary open-angle glaucoma, left eye, mild stage: Secondary | ICD-10-CM | POA: Diagnosis not present

## 2019-03-10 DIAGNOSIS — H04123 Dry eye syndrome of bilateral lacrimal glands: Secondary | ICD-10-CM | POA: Diagnosis not present

## 2019-03-10 DIAGNOSIS — H353221 Exudative age-related macular degeneration, left eye, with active choroidal neovascularization: Secondary | ICD-10-CM | POA: Diagnosis not present

## 2019-03-10 DIAGNOSIS — H401112 Primary open-angle glaucoma, right eye, moderate stage: Secondary | ICD-10-CM | POA: Diagnosis not present

## 2019-03-12 ENCOUNTER — Encounter (INDEPENDENT_AMBULATORY_CARE_PROVIDER_SITE_OTHER): Payer: Medicare Other | Admitting: Ophthalmology

## 2019-03-12 ENCOUNTER — Other Ambulatory Visit: Payer: Self-pay

## 2019-03-12 DIAGNOSIS — D3131 Benign neoplasm of right choroid: Secondary | ICD-10-CM | POA: Diagnosis not present

## 2019-03-12 DIAGNOSIS — H353132 Nonexudative age-related macular degeneration, bilateral, intermediate dry stage: Secondary | ICD-10-CM

## 2019-03-12 DIAGNOSIS — I10 Essential (primary) hypertension: Secondary | ICD-10-CM

## 2019-03-12 DIAGNOSIS — H43813 Vitreous degeneration, bilateral: Secondary | ICD-10-CM

## 2019-03-12 DIAGNOSIS — H33302 Unspecified retinal break, left eye: Secondary | ICD-10-CM | POA: Diagnosis not present

## 2019-03-12 DIAGNOSIS — H35033 Hypertensive retinopathy, bilateral: Secondary | ICD-10-CM | POA: Diagnosis not present

## 2019-03-12 DIAGNOSIS — H59032 Cystoid macular edema following cataract surgery, left eye: Secondary | ICD-10-CM | POA: Diagnosis not present

## 2019-03-12 DIAGNOSIS — H35373 Puckering of macula, bilateral: Secondary | ICD-10-CM

## 2019-04-16 ENCOUNTER — Other Ambulatory Visit: Payer: Self-pay

## 2019-04-16 ENCOUNTER — Encounter (INDEPENDENT_AMBULATORY_CARE_PROVIDER_SITE_OTHER): Payer: Medicare Other | Admitting: Ophthalmology

## 2019-04-16 DIAGNOSIS — H59032 Cystoid macular edema following cataract surgery, left eye: Secondary | ICD-10-CM | POA: Diagnosis not present

## 2019-04-16 DIAGNOSIS — H35033 Hypertensive retinopathy, bilateral: Secondary | ICD-10-CM

## 2019-04-16 DIAGNOSIS — H33302 Unspecified retinal break, left eye: Secondary | ICD-10-CM

## 2019-04-16 DIAGNOSIS — H353112 Nonexudative age-related macular degeneration, right eye, intermediate dry stage: Secondary | ICD-10-CM

## 2019-04-16 DIAGNOSIS — H43813 Vitreous degeneration, bilateral: Secondary | ICD-10-CM

## 2019-04-16 DIAGNOSIS — D3131 Benign neoplasm of right choroid: Secondary | ICD-10-CM | POA: Diagnosis not present

## 2019-04-16 DIAGNOSIS — I10 Essential (primary) hypertension: Secondary | ICD-10-CM

## 2019-04-30 DIAGNOSIS — Z23 Encounter for immunization: Secondary | ICD-10-CM | POA: Diagnosis not present

## 2019-05-12 DIAGNOSIS — J301 Allergic rhinitis due to pollen: Secondary | ICD-10-CM | POA: Diagnosis not present

## 2019-05-28 ENCOUNTER — Encounter (INDEPENDENT_AMBULATORY_CARE_PROVIDER_SITE_OTHER): Payer: Medicare Other | Admitting: Ophthalmology

## 2019-05-28 DIAGNOSIS — D3131 Benign neoplasm of right choroid: Secondary | ICD-10-CM

## 2019-05-28 DIAGNOSIS — I10 Essential (primary) hypertension: Secondary | ICD-10-CM

## 2019-05-28 DIAGNOSIS — H353112 Nonexudative age-related macular degeneration, right eye, intermediate dry stage: Secondary | ICD-10-CM

## 2019-05-28 DIAGNOSIS — H35033 Hypertensive retinopathy, bilateral: Secondary | ICD-10-CM

## 2019-05-28 DIAGNOSIS — H33302 Unspecified retinal break, left eye: Secondary | ICD-10-CM

## 2019-05-28 DIAGNOSIS — H34832 Tributary (branch) retinal vein occlusion, left eye, with macular edema: Secondary | ICD-10-CM

## 2019-05-28 DIAGNOSIS — H43813 Vitreous degeneration, bilateral: Secondary | ICD-10-CM | POA: Diagnosis not present

## 2019-05-29 ENCOUNTER — Other Ambulatory Visit: Payer: Self-pay

## 2019-05-29 ENCOUNTER — Encounter (INDEPENDENT_AMBULATORY_CARE_PROVIDER_SITE_OTHER): Payer: Medicare Other | Admitting: Ophthalmology

## 2019-05-29 DIAGNOSIS — H59032 Cystoid macular edema following cataract surgery, left eye: Secondary | ICD-10-CM

## 2019-05-30 DIAGNOSIS — N401 Enlarged prostate with lower urinary tract symptoms: Secondary | ICD-10-CM | POA: Diagnosis not present

## 2019-05-30 DIAGNOSIS — R3912 Poor urinary stream: Secondary | ICD-10-CM | POA: Diagnosis not present

## 2019-05-30 DIAGNOSIS — N5201 Erectile dysfunction due to arterial insufficiency: Secondary | ICD-10-CM | POA: Diagnosis not present

## 2019-06-25 DIAGNOSIS — J0191 Acute recurrent sinusitis, unspecified: Secondary | ICD-10-CM | POA: Diagnosis not present

## 2019-06-25 DIAGNOSIS — R634 Abnormal weight loss: Secondary | ICD-10-CM | POA: Diagnosis not present

## 2019-06-25 DIAGNOSIS — R413 Other amnesia: Secondary | ICD-10-CM | POA: Diagnosis not present

## 2019-06-26 ENCOUNTER — Encounter (INDEPENDENT_AMBULATORY_CARE_PROVIDER_SITE_OTHER): Payer: Medicare Other | Admitting: Ophthalmology

## 2019-07-11 ENCOUNTER — Other Ambulatory Visit: Payer: Self-pay

## 2019-07-11 ENCOUNTER — Encounter (INDEPENDENT_AMBULATORY_CARE_PROVIDER_SITE_OTHER): Payer: Medicare Other | Admitting: Ophthalmology

## 2019-07-11 DIAGNOSIS — I10 Essential (primary) hypertension: Secondary | ICD-10-CM

## 2019-07-11 DIAGNOSIS — H35033 Hypertensive retinopathy, bilateral: Secondary | ICD-10-CM | POA: Diagnosis not present

## 2019-07-11 DIAGNOSIS — H59032 Cystoid macular edema following cataract surgery, left eye: Secondary | ICD-10-CM | POA: Diagnosis not present

## 2019-07-11 DIAGNOSIS — D3131 Benign neoplasm of right choroid: Secondary | ICD-10-CM

## 2019-07-11 DIAGNOSIS — H34832 Tributary (branch) retinal vein occlusion, left eye, with macular edema: Secondary | ICD-10-CM

## 2019-07-11 DIAGNOSIS — H43813 Vitreous degeneration, bilateral: Secondary | ICD-10-CM

## 2019-08-07 ENCOUNTER — Ambulatory Visit: Payer: Medicare Other | Attending: Internal Medicine

## 2019-08-07 DIAGNOSIS — Z23 Encounter for immunization: Secondary | ICD-10-CM | POA: Diagnosis not present

## 2019-08-07 NOTE — Progress Notes (Signed)
   Covid-19 Vaccination Clinic  Name:  Seth Carter    MRN: [id number] DOB: [birth year]  08/07/2019  Mr. Seth Carter was observed post Covid-19 immunization for 15 minutes without incidence. He was provided with Vaccine Information Sheet and instruction to access the V-Safe system.   Mr. Seth Carter was instructed to call 911 with any severe reactions post vaccine: Marland Kitchen Difficulty breathing  . Swelling of your face and throat  . A fast heartbeat  . A bad rash all over your body  . Dizziness and weakness    Immunizations Administered    Name Date Dose VIS Date Route   Pfizer COVID-19 Vaccine 08/07/2019 11:52 AM 0.3 mL 07/04/2019 Intramuscular   Manufacturer: [place]   Lot: [id number]   [place]: [id number]

## 2019-08-08 ENCOUNTER — Encounter (INDEPENDENT_AMBULATORY_CARE_PROVIDER_SITE_OTHER): Payer: Medicare Other | Admitting: Ophthalmology

## 2019-08-08 ENCOUNTER — Other Ambulatory Visit: Payer: Self-pay

## 2019-08-08 DIAGNOSIS — H353132 Nonexudative age-related macular degeneration, bilateral, intermediate dry stage: Secondary | ICD-10-CM

## 2019-08-08 DIAGNOSIS — H35033 Hypertensive retinopathy, bilateral: Secondary | ICD-10-CM

## 2019-08-08 DIAGNOSIS — H43813 Vitreous degeneration, bilateral: Secondary | ICD-10-CM | POA: Diagnosis not present

## 2019-08-08 DIAGNOSIS — D3131 Benign neoplasm of right choroid: Secondary | ICD-10-CM

## 2019-08-08 DIAGNOSIS — H33302 Unspecified retinal break, left eye: Secondary | ICD-10-CM | POA: Diagnosis not present

## 2019-08-08 DIAGNOSIS — I10 Essential (primary) hypertension: Secondary | ICD-10-CM

## 2019-08-08 DIAGNOSIS — H34832 Tributary (branch) retinal vein occlusion, left eye, with macular edema: Secondary | ICD-10-CM | POA: Diagnosis not present

## 2019-08-27 ENCOUNTER — Ambulatory Visit: Payer: Medicare Other | Attending: Internal Medicine

## 2019-08-27 DIAGNOSIS — Z23 Encounter for immunization: Secondary | ICD-10-CM | POA: Insufficient documentation

## 2019-08-27 NOTE — Progress Notes (Signed)
   Covid-19 Vaccination Clinic  Name:  Seth Carter    MRN: [id number] DOB: Sep 10, 1940  08/27/2019  [name] was observed post Covid-19 immunization for 15 minutes without incidence. He was provided with Vaccine Information Sheet and instruction to access the V-Safe system.   [name] was instructed to call 911 with any severe reactions post vaccine: Marland Kitchen Difficulty breathing  . Swelling of your face and throat  . A fast heartbeat  . A bad rash all over your body  . Dizziness and weakness    Immunizations Administered    Name Date Dose VIS Date Route   Pfizer COVID-19 Vaccine 08/27/2019 11:40 AM 0.3 mL 07/04/2019 Intramuscular   Manufacturer: [place]   Lot: [id number]   [place]: [id number]

## 2019-09-05 ENCOUNTER — Encounter (INDEPENDENT_AMBULATORY_CARE_PROVIDER_SITE_OTHER): Payer: Medicare Other | Admitting: Ophthalmology

## 2019-09-05 ENCOUNTER — Other Ambulatory Visit: Payer: Self-pay

## 2019-09-05 DIAGNOSIS — H35033 Hypertensive retinopathy, bilateral: Secondary | ICD-10-CM | POA: Diagnosis not present

## 2019-09-05 DIAGNOSIS — H33302 Unspecified retinal break, left eye: Secondary | ICD-10-CM | POA: Diagnosis not present

## 2019-09-05 DIAGNOSIS — I10 Essential (primary) hypertension: Secondary | ICD-10-CM | POA: Diagnosis not present

## 2019-09-05 DIAGNOSIS — H59032 Cystoid macular edema following cataract surgery, left eye: Secondary | ICD-10-CM

## 2019-09-05 DIAGNOSIS — H43813 Vitreous degeneration, bilateral: Secondary | ICD-10-CM

## 2019-09-05 DIAGNOSIS — H353132 Nonexudative age-related macular degeneration, bilateral, intermediate dry stage: Secondary | ICD-10-CM

## 2019-09-05 DIAGNOSIS — H34832 Tributary (branch) retinal vein occlusion, left eye, with macular edema: Secondary | ICD-10-CM

## 2019-09-05 DIAGNOSIS — D3131 Benign neoplasm of right choroid: Secondary | ICD-10-CM

## 2019-09-10 DIAGNOSIS — H532 Diplopia: Secondary | ICD-10-CM | POA: Diagnosis not present

## 2019-09-10 DIAGNOSIS — H04123 Dry eye syndrome of bilateral lacrimal glands: Secondary | ICD-10-CM | POA: Diagnosis not present

## 2019-09-10 DIAGNOSIS — H353221 Exudative age-related macular degeneration, left eye, with active choroidal neovascularization: Secondary | ICD-10-CM | POA: Diagnosis not present

## 2019-09-10 DIAGNOSIS — Z961 Presence of intraocular lens: Secondary | ICD-10-CM | POA: Diagnosis not present

## 2019-09-10 DIAGNOSIS — H401121 Primary open-angle glaucoma, left eye, mild stage: Secondary | ICD-10-CM | POA: Diagnosis not present

## 2019-09-10 DIAGNOSIS — H353111 Nonexudative age-related macular degeneration, right eye, early dry stage: Secondary | ICD-10-CM | POA: Diagnosis not present

## 2019-10-01 ENCOUNTER — Encounter (INDEPENDENT_AMBULATORY_CARE_PROVIDER_SITE_OTHER): Payer: Medicare Other | Admitting: Ophthalmology

## 2019-10-01 DIAGNOSIS — D3131 Benign neoplasm of right choroid: Secondary | ICD-10-CM

## 2019-10-01 DIAGNOSIS — H34832 Tributary (branch) retinal vein occlusion, left eye, with macular edema: Secondary | ICD-10-CM | POA: Diagnosis not present

## 2019-10-01 DIAGNOSIS — H43813 Vitreous degeneration, bilateral: Secondary | ICD-10-CM

## 2019-10-01 DIAGNOSIS — I10 Essential (primary) hypertension: Secondary | ICD-10-CM

## 2019-10-01 DIAGNOSIS — H35033 Hypertensive retinopathy, bilateral: Secondary | ICD-10-CM

## 2019-10-01 DIAGNOSIS — H33302 Unspecified retinal break, left eye: Secondary | ICD-10-CM

## 2019-10-01 DIAGNOSIS — H353132 Nonexudative age-related macular degeneration, bilateral, intermediate dry stage: Secondary | ICD-10-CM

## 2019-10-23 DIAGNOSIS — R351 Nocturia: Secondary | ICD-10-CM | POA: Diagnosis not present

## 2019-10-23 DIAGNOSIS — N401 Enlarged prostate with lower urinary tract symptoms: Secondary | ICD-10-CM | POA: Diagnosis not present

## 2019-10-23 DIAGNOSIS — R3912 Poor urinary stream: Secondary | ICD-10-CM | POA: Diagnosis not present

## 2019-10-29 ENCOUNTER — Encounter (INDEPENDENT_AMBULATORY_CARE_PROVIDER_SITE_OTHER): Payer: Medicare Other | Admitting: Ophthalmology

## 2019-10-29 DIAGNOSIS — R109 Unspecified abdominal pain: Secondary | ICD-10-CM | POA: Diagnosis not present

## 2019-10-29 DIAGNOSIS — I10 Essential (primary) hypertension: Secondary | ICD-10-CM | POA: Diagnosis not present

## 2019-11-03 ENCOUNTER — Encounter (INDEPENDENT_AMBULATORY_CARE_PROVIDER_SITE_OTHER): Payer: Medicare Other | Admitting: Ophthalmology

## 2019-11-03 DIAGNOSIS — H34832 Tributary (branch) retinal vein occlusion, left eye, with macular edema: Secondary | ICD-10-CM | POA: Diagnosis not present

## 2019-11-03 DIAGNOSIS — H35033 Hypertensive retinopathy, bilateral: Secondary | ICD-10-CM

## 2019-11-03 DIAGNOSIS — H43813 Vitreous degeneration, bilateral: Secondary | ICD-10-CM

## 2019-11-03 DIAGNOSIS — H353132 Nonexudative age-related macular degeneration, bilateral, intermediate dry stage: Secondary | ICD-10-CM

## 2019-11-03 DIAGNOSIS — H33302 Unspecified retinal break, left eye: Secondary | ICD-10-CM

## 2019-11-03 DIAGNOSIS — I10 Essential (primary) hypertension: Secondary | ICD-10-CM | POA: Diagnosis not present

## 2019-11-03 DIAGNOSIS — D3131 Benign neoplasm of right choroid: Secondary | ICD-10-CM | POA: Diagnosis not present

## 2019-11-07 DIAGNOSIS — N281 Cyst of kidney, acquired: Secondary | ICD-10-CM | POA: Diagnosis not present

## 2019-11-07 DIAGNOSIS — R109 Unspecified abdominal pain: Secondary | ICD-10-CM | POA: Diagnosis not present

## 2019-11-11 DIAGNOSIS — R29898 Other symptoms and signs involving the musculoskeletal system: Secondary | ICD-10-CM | POA: Diagnosis not present

## 2019-11-11 DIAGNOSIS — R258 Other abnormal involuntary movements: Secondary | ICD-10-CM | POA: Diagnosis not present

## 2019-11-11 DIAGNOSIS — I10 Essential (primary) hypertension: Secondary | ICD-10-CM | POA: Diagnosis not present

## 2019-11-11 DIAGNOSIS — R109 Unspecified abdominal pain: Secondary | ICD-10-CM | POA: Diagnosis not present

## 2019-11-25 ENCOUNTER — Encounter: Payer: Self-pay | Admitting: Neurology

## 2019-11-25 DIAGNOSIS — R29898 Other symptoms and signs involving the musculoskeletal system: Secondary | ICD-10-CM | POA: Diagnosis not present

## 2019-11-25 DIAGNOSIS — R109 Unspecified abdominal pain: Secondary | ICD-10-CM | POA: Diagnosis not present

## 2019-11-25 DIAGNOSIS — R258 Other abnormal involuntary movements: Secondary | ICD-10-CM | POA: Diagnosis not present

## 2019-11-25 DIAGNOSIS — I10 Essential (primary) hypertension: Secondary | ICD-10-CM | POA: Diagnosis not present

## 2019-11-25 DIAGNOSIS — R079 Chest pain, unspecified: Secondary | ICD-10-CM | POA: Diagnosis not present

## 2019-12-02 ENCOUNTER — Encounter (INDEPENDENT_AMBULATORY_CARE_PROVIDER_SITE_OTHER): Payer: Medicare Other | Admitting: Ophthalmology

## 2019-12-02 ENCOUNTER — Other Ambulatory Visit: Payer: Self-pay

## 2019-12-02 DIAGNOSIS — H35033 Hypertensive retinopathy, bilateral: Secondary | ICD-10-CM | POA: Diagnosis not present

## 2019-12-02 DIAGNOSIS — H43813 Vitreous degeneration, bilateral: Secondary | ICD-10-CM

## 2019-12-02 DIAGNOSIS — H34832 Tributary (branch) retinal vein occlusion, left eye, with macular edema: Secondary | ICD-10-CM | POA: Diagnosis not present

## 2019-12-02 DIAGNOSIS — I10 Essential (primary) hypertension: Secondary | ICD-10-CM | POA: Diagnosis not present

## 2019-12-02 DIAGNOSIS — D3131 Benign neoplasm of right choroid: Secondary | ICD-10-CM | POA: Diagnosis not present

## 2019-12-02 DIAGNOSIS — H353132 Nonexudative age-related macular degeneration, bilateral, intermediate dry stage: Secondary | ICD-10-CM | POA: Diagnosis not present

## 2019-12-02 DIAGNOSIS — H33302 Unspecified retinal break, left eye: Secondary | ICD-10-CM

## 2019-12-10 ENCOUNTER — Encounter: Payer: Self-pay | Admitting: Internal Medicine

## 2019-12-16 NOTE — Progress Notes (Signed)
Assessment/Plan:    1.  Bradykinesia  -Patient and I had a long discussion.  While he does have bradykinesia, he does not meet the other clinical criteria for the diagnosis of Parkinson's disease.  Discussed the 2015 MDS criteria for the diagnosis of Parkinson's disease.  While it requires bradykinesia, it then requires either rigidity or tremor.  He does not have either of these.  That being said, I really would like to follow him just to make sure he is not developing Parkinson's disease.  He is agreeable to that.  Therefore, I would like to see him back in the next 9 months to 1 year.  Certainly, if symptoms develop before that time, he is to call me.  -I did encourage the patient to exercise, particularly stationary biking.  2.  Lightheadedness/near syncope  -Patient does have an upcoming cardiology appointment.  Patient states that he was actually unaware, but was pleasantly surprised.  I gave him the appointment date and time for that.  Subjective:   Seth Carter was seen today in the movement disorders clinic for neurologic consultation at the request of [name], MD.  The consultation is for the evaluation of bradykinesia and masked face and to rule out early Parkinson's disease.  Medical records that were made available to me were reviewed.   Specific Symptoms:  Tremor: Yes.   Family hx of similar:  No. Voice: hoarse and attributes to pollen Sleep: sleeps well  Vivid Dreams:  No.  Acting out dreams:  No. - wife tells him occasionally will talk Wet Pillows: Yes.   x 6 months Postural symptoms:  Yes.    Falls?  No. Bradykinesia symptoms: slow movements Loss of smell:  Yes.  , some but attributes to working around chemicals Loss of taste:  No., but has lost appetite and weight - 20 lb over year (started off trying but not now) Urinary Incontinence:  No. but has frequency Difficulty Swallowing:  No. Handwriting, micrographia: Yes.   Trouble with ADL's:  No.  Trouble  buttoning clothing: No. Memory changes:  Yes.  , forgetting names and trouble with math  N/V:  No. Lightheaded:  Yes.  , "this is one of my major problems"  Syncope: Yes.   but that was 5-6 years ago and it was due to "pills for urinary problems and blood pressure pills" Diplopia:  Yes.  , if turns to the far right he will get 2 images   Neuroimaging of the brain has not previously been performed.    ALLERGIES:   Allergies  Allergen Reactions  . Cialis [Tadalafil] Other (See Comments)    Severe back pain  . Milk-Related Compounds Diarrhea    upset stomach  . Pneumovax [Pneumococcal Polysaccharide Vaccine]     Arm swelling and pain  . Simvastatin Other (See Comments)    nightmares  . Ace Inhibitors Cough    CURRENT MEDICATIONS:  Current Outpatient Medications  Medication Instructions  . amLODipine (NORVASC) 5 mg, Oral, Daily  . aspirin EC 81 mg, Oral, Daily  . cholecalciferol (VITAMIN D3) 1,000 Units, Oral, Daily  . Co Q-10 100 mg, Oral, Daily  . hydrochlorothiazide (MICROZIDE) 12.5 mg, Oral, Daily  . Injection Device MISC 1 mL, Other, As needed  . latanoprost (XALATAN) 0.005 % ophthalmic solution 1 drop, Daily at bedtime  . Magnesium 250 MG TABS 1 tablet, Oral, Daily  . montelukast (SINGULAIR) 10 mg, Oral, Daily at bedtime  . Multiple Vitamin (MULTIVITAMIN WITH MINERALS) TABS  tablet 1 tablet, Oral, Daily  . Multiple Vitamins-Minerals (PRESERVISION/LUTEIN) CAPS 1 capsule, Oral, Daily  . rosuvastatin (CRESTOR) 5 mg, Oral, Daily  . sildenafil (VIAGRA) 100 mg, Oral, Daily PRN  . valsartan (DIOVAN) 80 mg, Oral, 3 times daily    Objective:   VITALS:   Vitals:   12/18/19 0939  BP: (!) 147/76  Pulse: (!) 59  SpO2: 99%  Weight: 197 lb (89.4 kg)  Height: 5\' 11"  (1.803 m)    GEN:  The patient appears stated age and is in NAD. HEENT:  Normocephalic, atraumatic.  The mucous membranes are moist. The superficial temporal arteries are without ropiness or tenderness. CV:   Loletha Grayer.  regular Lungs:  CTAB Neck/HEME:  There are no carotid bruits bilaterally.  Neurological examination:  Orientation: The patient is alert and oriented x3.  Cranial nerves: There is good facial symmetry. There is facial hypomimia.  Extraocular muscles are intact. The visual fields are full to confrontational testing. The speech is fluent and clear. He is hypophonic.  Soft palate rises symmetrically and there is no tongue deviation. Hearing is intact to conversational tone. Sensation: Sensation is intact to light and pinprick throughout (facial, trunk, extremities). Vibration is intact at the bilateral big toe. There is no extinction with double simultaneous stimulation. There is no sensory dermatomal level identified. Motor: Strength is 5/5 in the bilateral upper and lower extremities.   Shoulder shrug is equal and symmetric.  There is no pronator drift. Deep tendon reflexes: Deep tendon reflexes are 2/4 at the bilateral biceps, triceps, brachioradialis, 2+ at the bilateral patella and trace at the bilateral achilles. Plantar responses are downgoing bilaterally.  Movement examination: Tone: There is normal tone in the bilateral upper extremities.  The tone in the lower extremities is normal.  Abnormal movements: none Coordination:  There is no decremation with RAM's, with any form of RAMS, including alternating supination and pronation of the forearm, hand opening and closing, finger taps, heel taps and toe taps. Gait and Station: The patient has no difficulty arising out of a deep-seated chair without the use of the hands. The patient's stride length is good with decreased arm swing on the right.  Labs: None sent for review  Total time spent on today's visit was 45 minutes, including both face-to-face time and nonface-to-face time.  Time included that spent on review of records (prior notes available to me/labs/imaging if pertinent), discussing treatment and goals, answering patient's  questions and coordinating care.  Cc:  [name], MD

## 2019-12-18 ENCOUNTER — Ambulatory Visit (INDEPENDENT_AMBULATORY_CARE_PROVIDER_SITE_OTHER): Payer: Medicare Other | Admitting: Neurology

## 2019-12-18 ENCOUNTER — Encounter: Payer: Self-pay | Admitting: Neurology

## 2019-12-18 ENCOUNTER — Other Ambulatory Visit: Payer: Self-pay

## 2019-12-18 VITALS — BP 147/76 | HR 59 | Ht 71.0 in | Wt 197.0 lb

## 2019-12-18 DIAGNOSIS — T63461A Toxic effect of venom of wasps, accidental (unintentional), initial encounter: Secondary | ICD-10-CM | POA: Insufficient documentation

## 2019-12-18 DIAGNOSIS — R739 Hyperglycemia, unspecified: Secondary | ICD-10-CM

## 2019-12-18 DIAGNOSIS — E7800 Pure hypercholesterolemia, unspecified: Secondary | ICD-10-CM | POA: Insufficient documentation

## 2019-12-18 DIAGNOSIS — M179 Osteoarthritis of knee, unspecified: Secondary | ICD-10-CM | POA: Insufficient documentation

## 2019-12-18 DIAGNOSIS — B351 Tinea unguium: Secondary | ICD-10-CM

## 2019-12-18 DIAGNOSIS — R55 Syncope and collapse: Secondary | ICD-10-CM | POA: Diagnosis not present

## 2019-12-18 DIAGNOSIS — E782 Mixed hyperlipidemia: Secondary | ICD-10-CM | POA: Insufficient documentation

## 2019-12-18 DIAGNOSIS — R258 Other abnormal involuntary movements: Secondary | ICD-10-CM

## 2019-12-18 DIAGNOSIS — K219 Gastro-esophageal reflux disease without esophagitis: Secondary | ICD-10-CM | POA: Insufficient documentation

## 2019-12-18 DIAGNOSIS — I10 Essential (primary) hypertension: Secondary | ICD-10-CM | POA: Insufficient documentation

## 2019-12-18 DIAGNOSIS — R109 Unspecified abdominal pain: Secondary | ICD-10-CM | POA: Insufficient documentation

## 2019-12-18 DIAGNOSIS — M47812 Spondylosis without myelopathy or radiculopathy, cervical region: Secondary | ICD-10-CM | POA: Insufficient documentation

## 2019-12-18 DIAGNOSIS — E559 Vitamin D deficiency, unspecified: Secondary | ICD-10-CM | POA: Insufficient documentation

## 2019-12-18 DIAGNOSIS — M1710 Unilateral primary osteoarthritis, unspecified knee: Secondary | ICD-10-CM | POA: Insufficient documentation

## 2019-12-18 DIAGNOSIS — Z79899 Other long term (current) drug therapy: Secondary | ICD-10-CM | POA: Insufficient documentation

## 2019-12-18 DIAGNOSIS — L723 Sebaceous cyst: Secondary | ICD-10-CM

## 2019-12-18 DIAGNOSIS — N200 Calculus of kidney: Secondary | ICD-10-CM

## 2019-12-18 DIAGNOSIS — E291 Testicular hypofunction: Secondary | ICD-10-CM | POA: Insufficient documentation

## 2019-12-18 DIAGNOSIS — Z515 Encounter for palliative care: Secondary | ICD-10-CM | POA: Insufficient documentation

## 2019-12-18 DIAGNOSIS — M79643 Pain in unspecified hand: Secondary | ICD-10-CM

## 2019-12-18 DIAGNOSIS — Z8349 Family history of other endocrine, nutritional and metabolic diseases: Secondary | ICD-10-CM | POA: Insufficient documentation

## 2019-12-18 DIAGNOSIS — N529 Male erectile dysfunction, unspecified: Secondary | ICD-10-CM | POA: Insufficient documentation

## 2019-12-18 DIAGNOSIS — R7301 Impaired fasting glucose: Secondary | ICD-10-CM | POA: Insufficient documentation

## 2019-12-18 DIAGNOSIS — R5383 Other fatigue: Secondary | ICD-10-CM | POA: Insufficient documentation

## 2019-12-18 DIAGNOSIS — K649 Unspecified hemorrhoids: Secondary | ICD-10-CM | POA: Insufficient documentation

## 2019-12-18 HISTORY — DX: Pain in unspecified hand: M79.643

## 2019-12-18 HISTORY — DX: Sebaceous cyst: L72.3

## 2019-12-18 HISTORY — DX: Hyperglycemia, unspecified: R73.9

## 2019-12-18 HISTORY — DX: Osteoarthritis of knee, unspecified: M17.9

## 2019-12-18 HISTORY — DX: Testicular hypofunction: E29.1

## 2019-12-18 HISTORY — DX: Toxic effect of venom of wasps, accidental (unintentional), initial encounter: T63.461A

## 2019-12-18 HISTORY — DX: Calculus of kidney: N20.0

## 2019-12-18 HISTORY — DX: Tinea unguium: B35.1

## 2019-12-18 HISTORY — DX: Gastro-esophageal reflux disease without esophagitis: K21.9

## 2019-12-18 HISTORY — DX: Spondylosis without myelopathy or radiculopathy, cervical region: M47.812

## 2019-12-18 HISTORY — DX: Unilateral primary osteoarthritis, unspecified knee: M17.10

## 2019-12-18 NOTE — Patient Instructions (Signed)
1.  Exercise!  I prefer stationary bike  The physicians and staff at [facility] are committed to providing excellent care. You may receive a survey requesting feedback about your experience at our office. We strive to receive "very good" responses to the survey questions. If you feel that your experience would prevent you from giving the office a "very good " response, please contact our office to try to remedy the situation. We may be reached at [phone]. Thank you for taking the time out of your busy day to complete the survey.

## 2019-12-26 ENCOUNTER — Other Ambulatory Visit: Payer: Self-pay

## 2019-12-26 ENCOUNTER — Ambulatory Visit (INDEPENDENT_AMBULATORY_CARE_PROVIDER_SITE_OTHER): Payer: Medicare Other | Admitting: Cardiology

## 2019-12-26 ENCOUNTER — Telehealth: Payer: Self-pay | Admitting: Radiology

## 2019-12-26 ENCOUNTER — Encounter: Payer: Self-pay | Admitting: *Deleted

## 2019-12-26 ENCOUNTER — Encounter: Payer: Self-pay | Admitting: Cardiology

## 2019-12-26 VITALS — BP 120/66 | HR 56 | Ht 71.0 in | Wt 198.0 lb

## 2019-12-26 DIAGNOSIS — R55 Syncope and collapse: Secondary | ICD-10-CM | POA: Diagnosis not present

## 2019-12-26 DIAGNOSIS — R079 Chest pain, unspecified: Secondary | ICD-10-CM

## 2019-12-26 DIAGNOSIS — R0789 Other chest pain: Secondary | ICD-10-CM | POA: Diagnosis not present

## 2019-12-26 DIAGNOSIS — R42 Dizziness and giddiness: Secondary | ICD-10-CM | POA: Diagnosis not present

## 2019-12-26 NOTE — Patient Instructions (Addendum)
Medication Instructions:  Please discontinue your Amlodipine.  Continue all other medications as listed.  *If you need a refill on your cardiac medications before your next appointment, please call your pharmacy*  Testing/Procedures: Your physician has requested that you have an echocardiogram. Echocardiography is a painless test that uses sound waves to create images of your heart. It provides your doctor with information about the size and shape of your heart and how well your heart's chambers and valves are working. This procedure takes approximately one hour. There are no restrictions for this procedure.  Your physician has requested that you have a lexiscan myoview. For further information please visit [email or web address]. Please follow instruction sheet, as given.  ZIO XT- Long Term Monitor Instructions   Your physician has requested you wear your ZIO patch monitor 14 days.   This is a single patch monitor.  [employer] supplies one patch monitor per enrollment.  Additional stickers are not available.   Please do not apply patch if you will be having a Nuclear Stress Test, Echocardiogram, Cardiac CT, MRI, or Chest Xray during the time frame you would be wearing the monitor. The patch cannot be worn during these tests.  You cannot remove and re-apply the ZIO XT patch monitor.   Your ZIO patch monitor will be sent USPS Priority mail from [facility] directly to your home address. The monitor may also be mailed to a PO BOX if home delivery is not available.   It may take 3-5 days to receive your monitor after you have been enrolled.   Once you have received you monitor, please review enclosed instructions.  Your monitor has already been registered assigning a specific monitor serial # to you.   Applying the monitor   Shave hair from upper left chest.   Hold abrader disc by orange tab.  Rub abrader in 40 strokes over left upper chest as indicated in your monitor instructions.     Clean area with 4 enclosed alcohol pads .  Use all pads to assure are is cleaned thoroughly.  Let dry.   Apply patch as indicated in monitor instructions.  Patch will be place under collarbone on left side of chest with arrow pointing upward.   Rub patch adhesive wings for 2 minutes.Remove white label marked "1".  Remove white label marked "2".  Rub patch adhesive wings for 2 additional minutes.   While looking in a mirror, press and release button in center of patch.  A small green light will flash 3-4 times .  This will be your only indicator the monitor has been turned on.     Do not shower for the first 24 hours.  You may shower after the first 24 hours.   Press button if you feel a symptom. You will hear a small click.  Record Date, Time and Symptom in the Patient Log Book.   When you are ready to remove patch, follow instructions on last 2 pages of Patient Log Book.  Stick patch monitor onto last page of Patient Log Book.   Place Patient Log Book in [place] box.  Use locking tab on box and tape box closed securely.  The [employer] and [employer] has [employer] on it.  Please place in mailbox as soon as possible.  Your physician should have your test results approximately 7 days after the monitor has been mailed back to [facility].   Call [place] at [phone] if you have questions regarding your ZIO XT patch  monitor.  Call them immediately if you see an orange light blinking on your monitor.   If your monitor falls off in less than 4 days contact our Monitor department at [phone].  If your monitor becomes loose or falls off after 4 days call [employer] at [phone] for suggestions on securing your monitor.   Follow-Up: At [facility], you and your health needs are our priority.  As part of our continuing mission to provide you with exceptional heart care, we have created designated Provider Care Teams.  These Care Teams include your primary Cardiologist  (physician) and Advanced Practice Providers (APPs -  Physician Assistants and Nurse Practitioners) who all work together to provide you with the care you need, when you need it.  We recommend signing up for the patient portal called "MyChart".  Sign up information is provided on this After Visit Summary.  MyChart is used to connect with patients for Virtual Visits (Telemedicine).  Patients are able to view lab/test results, encounter notes, upcoming appointments, etc.  Non-urgent messages can be sent to your provider as well.   To learn more about what you can do with MyChart, go to [email or web address].    Follow up will be based on the results of the above testing.   Thank you for choosing [facility]!!

## 2019-12-26 NOTE — Telephone Encounter (Signed)
Enrolled patient for a 14 day Zio monitor to be mailed to patients home.  

## 2019-12-26 NOTE — Progress Notes (Signed)
Cardiology Office Note:    Date:  12/26/2019   ID:  Seth Carter, DOB Mar 10, 1941, MRN [id number]  PCP:  [name], MD  [facility] Cardiologist:  [name], MD  [facility] Electrophysiologist:  None   Referring MD: [name], MD     History of Present Illness:    Seth Carter is a 79 y.o. male here for the evaluation of chest tightness and near syncope at the request of Dr. Inda Merlin.  He was seen previously by Dr. Inda Merlin on 11/25/2019 for follow-up of blood pressure.  It was still elevated the time.  At that point he had not heard from neurology about his referral for possible Parkinson's.  He notes that his balance is not as good as it usually was.  He was also getting some exertional chest tightness when walking.  Off an on. Mild. Across shoulders. 5 min. Stop moving and it resolves.  This is the impetus behind referral.  Would not be able to walk on the treadmill.  Has cardiac risk factors of hypertension hyperlipidemia age and father who died of coronary artery disease.  Non-smoker.  Has had prior ACE inhibitor cough.  If stand up to quick, feels dizzy. Like want to go to sleep. Passed out once, on the floor after dinner in [place], walked up steps at top and passed out. 20 years ago. 3 days in hospital. 3 dx all 3 wrong he states. Prostate meds were the reason.  Past Medical History:  Diagnosis Date  . Acute pharyngitis   . Allergic rhinitis, unspecified   . BPH (benign prostatic hyperplasia)   . BPH with obstruction/lower urinary tract symptoms   . Bradykinesia   . Calculus of kidney   . Cataract secondary to ocular disorders (degenerative) (inflammatory), unspecified eye   . Chronic sinus infection   . Constipation   . ED (erectile dysfunction)   . Encounter for general adult medical examination without abnormal findings   . Enlarged prostate without lower urinary tract symptoms (luts)   . Essential hypertension, malignant   . Family history of hemochromatosis   .  Fatigue   . Flank pain   . Gastro-esophageal reflux disease without esophagitis   . Glaucoma   . HTN (hypertension)   . Hyperglycemia   . Hypogonadism in male   . Impaired fasting glucose   . Leg weakness   . Male erectile dysfunction, unspecified   . Memory loss   . Mixed hyperlipidemia   . Near syncope   . Onychomycosis   . Osteoarthritis of knee, unspecified   . Other long term (current) drug therapy   . Other spondylosis with radiculopathy, cervical region   . Pain in unspecified hand   . Pain in unspecified shoulder   . Pure hypercholesterolemia   . Seasonal allergic rhinitis due to pollen   . Sebaceous cyst   . Sting of hornets, wasps, and bees causing poisoning and toxic reactions   . Testicular hypogonadism   . Toxic effect of venom of wasps, accidental or unintentional, initial encounter   . Unspecified hemorrhoids   . Unspecified osteoarthritis, unspecified site   . Vitamin D deficiency, unspecified     Past Surgical History:  Procedure Laterality Date  . ANKLE SURGERY Right   . APPENDECTOMY    . CATARACT EXTRACTION, BILATERAL    . PROSTATE SURGERY    . SHOULDER SURGERY Right     Current Medications: Current Meds  Medication Sig  . Alprostadil (PROSTAGLANDIN E1)  POWD by Does not apply route.  Marland Kitchen aspirin EC 81 MG tablet Take 81 mg by mouth daily.  . cholecalciferol (VITAMIN D3) 25 MCG (1000 UNIT) tablet Take 1,000 Units by mouth daily.  . Coenzyme Q10 (CO Q-10) 100 MG CAPS Take 100 mg by mouth daily.  . hydrochlorothiazide (MICROZIDE) 12.5 MG capsule Take 12.5 mg by mouth daily.  . Injection Device MISC 1 mL by Other route as needed.  . latanoprost (XALATAN) 0.005 % ophthalmic solution 1 drop at bedtime.  . Magnesium 250 MG TABS Take 1 tablet by mouth daily.  . montelukast (SINGULAIR) 10 MG tablet Take 10 mg by mouth at bedtime.  . Multiple Vitamin (MULTIVITAMIN WITH MINERALS) TABS tablet Take 1 tablet by mouth daily.  . Multiple Vitamins-Minerals  (PRESERVISION/LUTEIN) CAPS Take 1 capsule by mouth daily.  . rosuvastatin (CRESTOR) 5 MG tablet Take 5 mg by mouth daily.  . sildenafil (VIAGRA) 100 MG tablet Take 100 mg by mouth daily as needed.  . valsartan (DIOVAN) 80 MG tablet Take 80 mg by mouth 3 (three) times daily.   . [DISCONTINUED] amLODipine (NORVASC) 5 MG tablet Take 5 mg by mouth daily.     Allergies:   Cialis [tadalafil], Milk-related compounds, Pneumovax [pneumococcal polysaccharide vaccine], Simvastatin, and Ace inhibitors   Social History   Socioeconomic History  . Marital status: Married    Spouse name: Not on file  . Number of children: Not on file  . Years of education: Not on file  . Highest education level: Not on file  Occupational History  . Not on file  Tobacco Use  . Smoking status: Never Smoker  . Smokeless tobacco: Never Used  Substance and Sexual Activity  . Alcohol use: Yes    Comment: 2-3 times per week, bacardi  . Drug use: Not on file  . Sexual activity: Not on file  Other Topics Concern  . Not on file  Social History Narrative   Right handed   Social Determinants of Health   Financial Resource Strain:   . Difficulty of Paying Living Expenses:   Food Insecurity:   . Worried About Charity fundraiser in the Last Year:   . Arboriculturist in the Last Year:   Transportation Needs:   . Film/video editor (Medical):   Marland Kitchen Lack of Transportation (Non-Medical):   Physical Activity:   . Days of Exercise per Week:   . Minutes of Exercise per Session:   Stress:   . Feeling of Stress :   Social Connections:   . Frequency of Communication with Friends and Family:   . Frequency of Social Gatherings with Friends and Family:   . Attends Religious Services:   . Active Member of Clubs or Organizations:   . Attends Archivist Meetings:   Marland Kitchen Marital Status:      Family History: The patient's family history includes Cancer in his mother; Heart attack in his father; Heart disease in his  sister; Other in his sister.  ROS:   Please see the history of present illness.     All other systems reviewed and are negative.  EKGs/Labs/Other Studies Reviewed:    The following studies were reviewed today: Prior office notes reviewed, hemoglobin 14.1 creatinine 1.02  EKG:  EKG is  ordered today.  The ekg ordered today demonstrates sinus bradycardia rate 56 nonspecific ST-T wave changes  Recent Labs: No results found for requested labs within last 8760 hours.  Recent Lipid Panel No results  found for: CHOL, TRIG, HDL, CHOLHDL, VLDL, LDLCALC, LDLDIRECT  Physical Exam:    VS:  BP 120/66   Pulse (!) 56   Ht 5\' 11"  (1.803 m)   Wt 198 lb (89.8 kg)   SpO2 98%   BMI 27.62 kg/m     Wt Readings from Last 3 Encounters:  12/26/19 198 lb (89.8 kg)  12/18/19 197 lb (89.4 kg)     GEN:  Well nourished, well developed in no acute distress HEENT: Normal NECK: No JVD; No carotid bruits LYMPHATICS: No lymphadenopathy CARDIAC: RRR, no murmurs, rubs, gallops RESPIRATORY:  Clear to auscultation without rales, wheezing or rhonchi  ABDOMEN: Soft, non-tender, non-distended MUSCULOSKELETAL:  No edema; No deformity  SKIN: Warm and dry NEUROLOGIC:  Alert and oriented x 3, bradykinesia noted PSYCHIATRIC:  Normal affect   ASSESSMENT:    1. Chest tightness   2. Chest pain, unspecified type   3. Dizziness   4. Near syncope    PLAN:    In order of problems listed above:  Chest tightness -We will go ahead and check a pharmacologic stress test.  He is not able to walk the treadmill.  Dizziness/near syncope -Checking orthostatics--lying 133/74, 55- sitting120/69, 61--standing 107/65, 66 -- standing 3 min 111/67, 62.  Pressure from 133 down to 107 we should stop his amlodipine 5 mg.  Willing to tolerate a slightly higher baseline blood pressure.  I wonder if his orthostasis could also be a byproduct of potential movement disorder.  I reviewed Dr. Doristine Devoid note and while he does have  bradykinesia, he does not meet other clinical criteria for diagnosis of Parkinson's.  He does not have rigidity or tremor.  She will be following him in the next 9 months.  Encouraged stationary bike. -Checking echocardiogram to ensure proper structure and function of his heart -Checking Zio patch monitor for 14 days to make sure he is not having any dangerous or adverse arrhythmias.  He has minor bradycardia which should not be symptomatic. -Prior syncopal episode was related to tamsulosin for prostate.  Bradykinesia -Dr. Donaciano Eva reviewed , see above.     Medication Adjustments/Labs and Tests Ordered: Current medicines are reviewed at length with the patient today.  Concerns regarding medicines are outlined above.  Orders Placed This Encounter  Procedures  . MYOCARDIAL PERFUSION IMAGING  . LONG TERM MONITOR (3-14 DAYS)  . EKG 12-Lead  . ECHOCARDIOGRAM COMPLETE   No orders of the defined types were placed in this encounter.   Patient Instructions  Medication Instructions:  Please discontinue your Amlodipine.  Continue all other medications as listed.  *If you need a refill on your cardiac medications before your next appointment, please call your pharmacy*  Testing/Procedures: Your physician has requested that you have an echocardiogram. Echocardiography is a painless test that uses sound waves to create images of your heart. It provides your doctor with information about the size and shape of your heart and how well your heart's chambers and valves are working. This procedure takes approximately one hour. There are no restrictions for this procedure.  Your physician has requested that you have a lexiscan myoview. For further information please visit [email or web address]. Please follow instruction sheet, as given.  ZIO XT- Long Term Monitor Instructions   Your physician has requested you wear your ZIO patch monitor 14 days.   This is a single patch monitor.  [employer] supplies one  patch monitor per enrollment.  Additional stickers are not available.   Please do not apply patch if  you will be having a Nuclear Stress Test, Echocardiogram, Cardiac CT, MRI, or Chest Xray during the time frame you would be wearing the monitor. The patch cannot be worn during these tests.  You cannot remove and re-apply the ZIO XT patch monitor.   Your ZIO patch monitor will be sent USPS Priority mail from [facility] directly to your home address. The monitor may also be mailed to a PO BOX if home delivery is not available.   It may take 3-5 days to receive your monitor after you have been enrolled.   Once you have received you monitor, please review enclosed instructions.  Your monitor has already been registered assigning a specific monitor serial # to you.   Applying the monitor   Shave hair from upper left chest.   Hold abrader disc by orange tab.  Rub abrader in 40 strokes over left upper chest as indicated in your monitor instructions.   Clean area with 4 enclosed alcohol pads .  Use all pads to assure are is cleaned thoroughly.  Let dry.   Apply patch as indicated in monitor instructions.  Patch will be place under collarbone on left side of chest with arrow pointing upward.   Rub patch adhesive wings for 2 minutes.Remove white label marked "1".  Remove white label marked "2".  Rub patch adhesive wings for 2 additional minutes.   While looking in a mirror, press and release button in center of patch.  A small green light will flash 3-4 times .  This will be your only indicator the monitor has been turned on.     Do not shower for the first 24 hours.  You may shower after the first 24 hours.   Press button if you feel a symptom. You will hear a small click.  Record Date, Time and Symptom in the Patient Log Book.   When you are ready to remove patch, follow instructions on last 2 pages of Patient Log Book.  Stick patch monitor onto last page of Patient Log Book.   Place  Patient Log Book in [place] box.  Use locking tab on box and tape box closed securely.  The [employer] has [employer] on it.  Please place in mailbox as soon as possible.  Your physician should have your test results approximately 7 days after the monitor has been mailed back to [facility].   Call Lucien at [phone] if you have questions regarding your ZIO XT patch monitor.  Call them immediately if you see an orange light blinking on your monitor.   If your monitor falls off in less than 4 days contact our Monitor department at [phone].  If your monitor becomes loose or falls off after 4 days call [employer] at [phone] for suggestions on securing your monitor.   Follow-Up: At [facility], you and your health needs are our priority.  As part of our continuing mission to provide you with exceptional heart care, we have created designated Provider Care Teams.  These Care Teams include your primary Cardiologist (physician) and Advanced Practice Providers (APPs -  Physician Assistants and Nurse Practitioners) who all work together to provide you with the care you need, when you need it.  We recommend signing up for the patient portal called "MyChart".  Sign up information is provided on this After Visit Summary.  MyChart is used to connect with patients for Virtual Visits (Telemedicine).  Patients are able to view lab/test results, encounter  notes, upcoming appointments, etc.  Non-urgent messages can be sent to your provider as well.   To learn more about what you can do with MyChart, go to [email or web address].    Follow up will be based on the results of the above testing.   Thank you for choosing [facility]!!        Signed, [name], MD  12/26/2019 9:40 AM    [place]

## 2020-01-01 ENCOUNTER — Encounter (INDEPENDENT_AMBULATORY_CARE_PROVIDER_SITE_OTHER): Payer: Medicare Other | Admitting: Ophthalmology

## 2020-01-01 ENCOUNTER — Other Ambulatory Visit: Payer: Self-pay

## 2020-01-01 ENCOUNTER — Other Ambulatory Visit (INDEPENDENT_AMBULATORY_CARE_PROVIDER_SITE_OTHER): Payer: Medicare Other

## 2020-01-01 DIAGNOSIS — H33302 Unspecified retinal break, left eye: Secondary | ICD-10-CM

## 2020-01-01 DIAGNOSIS — D3131 Benign neoplasm of right choroid: Secondary | ICD-10-CM

## 2020-01-01 DIAGNOSIS — H34832 Tributary (branch) retinal vein occlusion, left eye, with macular edema: Secondary | ICD-10-CM

## 2020-01-01 DIAGNOSIS — H43813 Vitreous degeneration, bilateral: Secondary | ICD-10-CM

## 2020-01-01 DIAGNOSIS — I10 Essential (primary) hypertension: Secondary | ICD-10-CM | POA: Diagnosis not present

## 2020-01-01 DIAGNOSIS — H35033 Hypertensive retinopathy, bilateral: Secondary | ICD-10-CM | POA: Diagnosis not present

## 2020-01-01 DIAGNOSIS — R42 Dizziness and giddiness: Secondary | ICD-10-CM | POA: Diagnosis not present

## 2020-01-01 DIAGNOSIS — R55 Syncope and collapse: Secondary | ICD-10-CM | POA: Diagnosis not present

## 2020-01-01 DIAGNOSIS — H353132 Nonexudative age-related macular degeneration, bilateral, intermediate dry stage: Secondary | ICD-10-CM

## 2020-01-14 ENCOUNTER — Telehealth (HOSPITAL_COMMUNITY): Payer: Self-pay | Admitting: *Deleted

## 2020-01-14 NOTE — Telephone Encounter (Signed)
Patient given detailed instructions per Myocardial Perfusion Study Information Sheet for the test on 01/16/20 at 1015. Patient notified to arrive 15 minutes early and that it is imperative to arrive on time for appointment to keep from having the test rescheduled.  If you need to cancel or reschedule your appointment, please call the office within 24 hours of your appointment. . Patient verbalized understanding.Seth Carter, Seth Carter

## 2020-01-16 ENCOUNTER — Ambulatory Visit (HOSPITAL_COMMUNITY): Payer: Medicare Other | Attending: Cardiology

## 2020-01-16 ENCOUNTER — Ambulatory Visit (HOSPITAL_BASED_OUTPATIENT_CLINIC_OR_DEPARTMENT_OTHER): Payer: Medicare Other

## 2020-01-16 ENCOUNTER — Other Ambulatory Visit: Payer: Self-pay

## 2020-01-16 DIAGNOSIS — R42 Dizziness and giddiness: Secondary | ICD-10-CM

## 2020-01-16 DIAGNOSIS — R079 Chest pain, unspecified: Secondary | ICD-10-CM

## 2020-01-16 DIAGNOSIS — R55 Syncope and collapse: Secondary | ICD-10-CM | POA: Insufficient documentation

## 2020-01-16 DIAGNOSIS — R0789 Other chest pain: Secondary | ICD-10-CM | POA: Insufficient documentation

## 2020-01-16 LAB — MYOCARDIAL PERFUSION IMAGING
LV dias vol: 105 mL (ref 62–150)
LV sys vol: 37 mL
Peak HR: 78 {beats}/min
Rest HR: 60 {beats}/min
SDS: 0
SRS: 0
SSS: 0
TID: 0.97

## 2020-01-16 MED ORDER — TECHNETIUM TC 99M TETROFOSMIN IV KIT
10.1000 | PACK | Freq: Once | INTRAVENOUS | Status: AC | PRN
  Administered 2020-01-16: 10.1 via INTRAVENOUS
  Filled 2020-01-16: qty 11

## 2020-01-16 MED ORDER — REGADENOSON 0.4 MG/5ML IV SOLN
0.4000 mg | Freq: Once | INTRAVENOUS | Status: AC
  Administered 2020-01-16: 0.4 mg via INTRAVENOUS

## 2020-01-16 MED ORDER — TECHNETIUM TC 99M TETROFOSMIN IV KIT
32.4000 | PACK | Freq: Once | INTRAVENOUS | Status: AC | PRN
  Administered 2020-01-16: 32.4 via INTRAVENOUS
  Filled 2020-01-16: qty 33

## 2020-01-20 ENCOUNTER — Telehealth: Payer: Self-pay | Admitting: Cardiology

## 2020-01-20 LAB — ECHOCARDIOGRAM COMPLETE
Height: 71 in
Weight: 3168 oz

## 2020-01-20 NOTE — Telephone Encounter (Signed)
New Message  Patient is calling in to go over stress test and echocardiogram results. Please give patient a call back.

## 2020-01-20 NOTE — Telephone Encounter (Signed)
·  °·  °·   Preliminary report - results have not been reviewed by Dr Marlou Porch at this test:    Nuclear stress EF: 65%.  There was no ST segment deviation noted during stress.  The study is normal.  This is a low risk study.  The left ventricular ejection fraction is normal (55-65%).   Normal stress nuclear study with no ischemia or infarction.  Gated ejection fraction 65% with normal wall motion.  Echo has not been read at this time.

## 2020-01-20 NOTE — Telephone Encounter (Signed)
Reviewed preliminary results of lexiscan and echo with pt.  Zio monitor results are not available at this time.  Pt advised Dr Marlou Porch has not reviewed these results but once he does I will c/b with any further information.  Reassurance of results given to pt.  He states he is going to the beach for a week.

## 2020-01-20 NOTE — Telephone Encounter (Signed)
   1. Global longitudinal strain is -20%.  2. Left ventricular ejection fraction, by estimation, is 55 to 60%. The  left ventricle has normal function. The left ventricle has no regional  wall motion abnormalities. There is mild left ventricular hypertrophy.  Left ventricular diastolic parameters  were normal.  3. Right ventricular systolic function is normal. The right ventricular  size is normal.  4. The mitral valve is normal in structure. Mild mitral valve  regurgitation.  5. The aortic valve is abnormal. Aortic valve regurgitation is trivial.  Mild to moderate aortic valve sclerosis/calcification is present, without  any evidence of aortic stenosis.  6. Aortic dilatation noted. There is mild dilatation of the ascending  aorta measuring 39 mm.  7. The inferior vena cava is normal in size with greater than 50%  respiratory variability, suggesting right atrial pressure of 3 mmHg.

## 2020-01-29 ENCOUNTER — Encounter (INDEPENDENT_AMBULATORY_CARE_PROVIDER_SITE_OTHER): Payer: Medicare Other | Admitting: Ophthalmology

## 2020-02-02 ENCOUNTER — Other Ambulatory Visit: Payer: Self-pay

## 2020-02-02 ENCOUNTER — Encounter (INDEPENDENT_AMBULATORY_CARE_PROVIDER_SITE_OTHER): Payer: Medicare Other | Admitting: Ophthalmology

## 2020-02-02 DIAGNOSIS — H43813 Vitreous degeneration, bilateral: Secondary | ICD-10-CM

## 2020-02-02 DIAGNOSIS — I10 Essential (primary) hypertension: Secondary | ICD-10-CM | POA: Diagnosis not present

## 2020-02-02 DIAGNOSIS — H33302 Unspecified retinal break, left eye: Secondary | ICD-10-CM

## 2020-02-02 DIAGNOSIS — H353132 Nonexudative age-related macular degeneration, bilateral, intermediate dry stage: Secondary | ICD-10-CM | POA: Diagnosis not present

## 2020-02-02 DIAGNOSIS — H35033 Hypertensive retinopathy, bilateral: Secondary | ICD-10-CM

## 2020-02-02 DIAGNOSIS — H34832 Tributary (branch) retinal vein occlusion, left eye, with macular edema: Secondary | ICD-10-CM

## 2020-02-02 DIAGNOSIS — D3131 Benign neoplasm of right choroid: Secondary | ICD-10-CM

## 2020-02-03 DIAGNOSIS — R42 Dizziness and giddiness: Secondary | ICD-10-CM | POA: Diagnosis not present

## 2020-02-03 DIAGNOSIS — R55 Syncope and collapse: Secondary | ICD-10-CM | POA: Diagnosis not present

## 2020-02-10 ENCOUNTER — Telehealth: Payer: Self-pay | Admitting: Cardiology

## 2020-02-10 NOTE — Telephone Encounter (Signed)
Seth Pain, MD  02/05/2020 10:24 AM EDT      Sinus rhythm with average HR 59 bpm  No pauses  No atrial fibrillation  Occasional PAC's and PVC's  Rare paroxysmal atrial tachycardia (4 beat duration for instance)  Symptom of lightheadedness associated with normal sinus rhythm  [name], MD   Zio results above   [name] 01/16/2020: Reassuring stress test. Normal, low risk with normal pump function.   [name] MD  Echo 01/16/2020:  EF 55 to 60% Mild left ventricular hypertrophy Mild MVRegurg, mild to moderate aortic valve calcification (no stenosis) Mild dilatation of ascending aorta 45mm   Reviewed the above information with patient who states understanding.  The results had been called to the patient and left on his VM (OK per DPR).  Pt is wanting to know if and when he needs to f/u with Dr Marlou Porch.  Advised I will have him to review and c/b once I have an answer for him.  Pt was very grateful for the call back and review/explanation of tests results.

## 2020-02-10 NOTE — Telephone Encounter (Signed)
   Pt would like to know when is his recall date. Also he is wondering why he hasn't hearing anything about his results

## 2020-02-10 NOTE — Telephone Encounter (Signed)
I think a PRN follow up is reasonable. Would be ok seeing him as needed.  [name], MD

## 2020-02-11 DIAGNOSIS — R258 Other abnormal involuntary movements: Secondary | ICD-10-CM | POA: Diagnosis not present

## 2020-02-11 DIAGNOSIS — E782 Mixed hyperlipidemia: Secondary | ICD-10-CM | POA: Diagnosis not present

## 2020-02-11 DIAGNOSIS — J301 Allergic rhinitis due to pollen: Secondary | ICD-10-CM | POA: Diagnosis not present

## 2020-02-11 DIAGNOSIS — N401 Enlarged prostate with lower urinary tract symptoms: Secondary | ICD-10-CM | POA: Diagnosis not present

## 2020-02-11 DIAGNOSIS — Z8349 Family history of other endocrine, nutritional and metabolic diseases: Secondary | ICD-10-CM | POA: Diagnosis not present

## 2020-02-11 DIAGNOSIS — E291 Testicular hypofunction: Secondary | ICD-10-CM | POA: Diagnosis not present

## 2020-02-11 DIAGNOSIS — E559 Vitamin D deficiency, unspecified: Secondary | ICD-10-CM | POA: Diagnosis not present

## 2020-02-11 DIAGNOSIS — T63461A Toxic effect of venom of wasps, accidental (unintentional), initial encounter: Secondary | ICD-10-CM | POA: Diagnosis not present

## 2020-02-11 DIAGNOSIS — R413 Other amnesia: Secondary | ICD-10-CM | POA: Diagnosis not present

## 2020-02-11 DIAGNOSIS — Z0000 Encounter for general adult medical examination without abnormal findings: Secondary | ICD-10-CM | POA: Diagnosis not present

## 2020-02-11 DIAGNOSIS — I10 Essential (primary) hypertension: Secondary | ICD-10-CM | POA: Diagnosis not present

## 2020-02-11 DIAGNOSIS — Z1389 Encounter for screening for other disorder: Secondary | ICD-10-CM | POA: Diagnosis not present

## 2020-02-11 DIAGNOSIS — R7309 Other abnormal glucose: Secondary | ICD-10-CM | POA: Diagnosis not present

## 2020-02-11 NOTE — Telephone Encounter (Signed)
Pt aware OK to f/u as needed.  Pt is presently at Dr Inda Merlin office for this annual physical.  Advised I did fax all testing results to Dr Inda Merlin for his records.

## 2020-02-16 ENCOUNTER — Ambulatory Visit: Payer: Medicare Other | Admitting: Cardiology

## 2020-03-01 ENCOUNTER — Other Ambulatory Visit: Payer: Self-pay

## 2020-03-01 ENCOUNTER — Encounter (INDEPENDENT_AMBULATORY_CARE_PROVIDER_SITE_OTHER): Payer: Medicare Other | Admitting: Ophthalmology

## 2020-03-01 DIAGNOSIS — H43813 Vitreous degeneration, bilateral: Secondary | ICD-10-CM | POA: Diagnosis not present

## 2020-03-01 DIAGNOSIS — I10 Essential (primary) hypertension: Secondary | ICD-10-CM | POA: Diagnosis not present

## 2020-03-01 DIAGNOSIS — D3131 Benign neoplasm of right choroid: Secondary | ICD-10-CM

## 2020-03-01 DIAGNOSIS — H353132 Nonexudative age-related macular degeneration, bilateral, intermediate dry stage: Secondary | ICD-10-CM | POA: Diagnosis not present

## 2020-03-01 DIAGNOSIS — H34832 Tributary (branch) retinal vein occlusion, left eye, with macular edema: Secondary | ICD-10-CM | POA: Diagnosis not present

## 2020-03-01 DIAGNOSIS — H35033 Hypertensive retinopathy, bilateral: Secondary | ICD-10-CM

## 2020-03-01 DIAGNOSIS — H33302 Unspecified retinal break, left eye: Secondary | ICD-10-CM | POA: Diagnosis not present

## 2020-03-18 DIAGNOSIS — Z961 Presence of intraocular lens: Secondary | ICD-10-CM | POA: Diagnosis not present

## 2020-03-18 DIAGNOSIS — H353111 Nonexudative age-related macular degeneration, right eye, early dry stage: Secondary | ICD-10-CM | POA: Diagnosis not present

## 2020-03-18 DIAGNOSIS — H04123 Dry eye syndrome of bilateral lacrimal glands: Secondary | ICD-10-CM | POA: Diagnosis not present

## 2020-03-18 DIAGNOSIS — H353221 Exudative age-related macular degeneration, left eye, with active choroidal neovascularization: Secondary | ICD-10-CM | POA: Diagnosis not present

## 2020-03-18 DIAGNOSIS — H532 Diplopia: Secondary | ICD-10-CM | POA: Diagnosis not present

## 2020-03-18 DIAGNOSIS — D3131 Benign neoplasm of right choroid: Secondary | ICD-10-CM | POA: Diagnosis not present

## 2020-03-18 DIAGNOSIS — H401131 Primary open-angle glaucoma, bilateral, mild stage: Secondary | ICD-10-CM | POA: Diagnosis not present

## 2020-03-30 ENCOUNTER — Encounter (INDEPENDENT_AMBULATORY_CARE_PROVIDER_SITE_OTHER): Payer: Medicare Other | Admitting: Ophthalmology

## 2020-04-05 ENCOUNTER — Other Ambulatory Visit: Payer: Self-pay

## 2020-04-05 ENCOUNTER — Encounter (INDEPENDENT_AMBULATORY_CARE_PROVIDER_SITE_OTHER): Payer: Medicare Other | Admitting: Ophthalmology

## 2020-04-05 DIAGNOSIS — H43813 Vitreous degeneration, bilateral: Secondary | ICD-10-CM

## 2020-04-05 DIAGNOSIS — H35033 Hypertensive retinopathy, bilateral: Secondary | ICD-10-CM

## 2020-04-05 DIAGNOSIS — H34832 Tributary (branch) retinal vein occlusion, left eye, with macular edema: Secondary | ICD-10-CM | POA: Diagnosis not present

## 2020-04-05 DIAGNOSIS — H33302 Unspecified retinal break, left eye: Secondary | ICD-10-CM

## 2020-04-05 DIAGNOSIS — I10 Essential (primary) hypertension: Secondary | ICD-10-CM | POA: Diagnosis not present

## 2020-04-05 DIAGNOSIS — D3131 Benign neoplasm of right choroid: Secondary | ICD-10-CM

## 2020-04-05 DIAGNOSIS — H353132 Nonexudative age-related macular degeneration, bilateral, intermediate dry stage: Secondary | ICD-10-CM | POA: Diagnosis not present

## 2020-04-21 DIAGNOSIS — Z23 Encounter for immunization: Secondary | ICD-10-CM | POA: Diagnosis not present

## 2020-05-03 ENCOUNTER — Encounter (INDEPENDENT_AMBULATORY_CARE_PROVIDER_SITE_OTHER): Payer: Medicare Other | Admitting: Ophthalmology

## 2020-05-03 ENCOUNTER — Other Ambulatory Visit: Payer: Self-pay

## 2020-05-03 DIAGNOSIS — H34832 Tributary (branch) retinal vein occlusion, left eye, with macular edema: Secondary | ICD-10-CM

## 2020-05-03 DIAGNOSIS — H35033 Hypertensive retinopathy, bilateral: Secondary | ICD-10-CM

## 2020-05-03 DIAGNOSIS — H353132 Nonexudative age-related macular degeneration, bilateral, intermediate dry stage: Secondary | ICD-10-CM

## 2020-05-03 DIAGNOSIS — I10 Essential (primary) hypertension: Secondary | ICD-10-CM

## 2020-05-03 DIAGNOSIS — D3131 Benign neoplasm of right choroid: Secondary | ICD-10-CM | POA: Diagnosis not present

## 2020-05-03 DIAGNOSIS — H43813 Vitreous degeneration, bilateral: Secondary | ICD-10-CM | POA: Diagnosis not present

## 2020-05-19 DIAGNOSIS — I10 Essential (primary) hypertension: Secondary | ICD-10-CM | POA: Diagnosis not present

## 2020-05-19 DIAGNOSIS — E291 Testicular hypofunction: Secondary | ICD-10-CM | POA: Diagnosis not present

## 2020-05-19 DIAGNOSIS — R7301 Impaired fasting glucose: Secondary | ICD-10-CM | POA: Diagnosis not present

## 2020-05-19 DIAGNOSIS — R258 Other abnormal involuntary movements: Secondary | ICD-10-CM | POA: Diagnosis not present

## 2020-05-19 DIAGNOSIS — R413 Other amnesia: Secondary | ICD-10-CM | POA: Diagnosis not present

## 2020-05-19 DIAGNOSIS — E559 Vitamin D deficiency, unspecified: Secondary | ICD-10-CM | POA: Diagnosis not present

## 2020-05-19 DIAGNOSIS — E538 Deficiency of other specified B group vitamins: Secondary | ICD-10-CM | POA: Diagnosis not present

## 2020-05-19 DIAGNOSIS — E782 Mixed hyperlipidemia: Secondary | ICD-10-CM | POA: Diagnosis not present

## 2020-05-19 DIAGNOSIS — N401 Enlarged prostate with lower urinary tract symptoms: Secondary | ICD-10-CM | POA: Diagnosis not present

## 2020-05-19 DIAGNOSIS — Z8349 Family history of other endocrine, nutritional and metabolic diseases: Secondary | ICD-10-CM | POA: Diagnosis not present

## 2020-05-28 DIAGNOSIS — N5201 Erectile dysfunction due to arterial insufficiency: Secondary | ICD-10-CM | POA: Diagnosis not present

## 2020-05-28 DIAGNOSIS — R3912 Poor urinary stream: Secondary | ICD-10-CM | POA: Diagnosis not present

## 2020-05-28 DIAGNOSIS — E291 Testicular hypofunction: Secondary | ICD-10-CM | POA: Diagnosis not present

## 2020-05-28 DIAGNOSIS — N401 Enlarged prostate with lower urinary tract symptoms: Secondary | ICD-10-CM | POA: Diagnosis not present

## 2020-05-28 DIAGNOSIS — R8271 Bacteriuria: Secondary | ICD-10-CM | POA: Diagnosis not present

## 2020-05-31 ENCOUNTER — Encounter (INDEPENDENT_AMBULATORY_CARE_PROVIDER_SITE_OTHER): Payer: Medicare Other | Admitting: Ophthalmology

## 2020-05-31 ENCOUNTER — Other Ambulatory Visit: Payer: Self-pay

## 2020-05-31 DIAGNOSIS — H353132 Nonexudative age-related macular degeneration, bilateral, intermediate dry stage: Secondary | ICD-10-CM

## 2020-05-31 DIAGNOSIS — H34832 Tributary (branch) retinal vein occlusion, left eye, with macular edema: Secondary | ICD-10-CM | POA: Diagnosis not present

## 2020-05-31 DIAGNOSIS — H35033 Hypertensive retinopathy, bilateral: Secondary | ICD-10-CM

## 2020-05-31 DIAGNOSIS — I10 Essential (primary) hypertension: Secondary | ICD-10-CM | POA: Diagnosis not present

## 2020-05-31 DIAGNOSIS — H43813 Vitreous degeneration, bilateral: Secondary | ICD-10-CM | POA: Diagnosis not present

## 2020-05-31 DIAGNOSIS — D3131 Benign neoplasm of right choroid: Secondary | ICD-10-CM

## 2020-05-31 DIAGNOSIS — H33302 Unspecified retinal break, left eye: Secondary | ICD-10-CM

## 2020-06-04 DIAGNOSIS — Z23 Encounter for immunization: Secondary | ICD-10-CM | POA: Diagnosis not present

## 2020-06-23 ENCOUNTER — Other Ambulatory Visit: Payer: Self-pay | Admitting: Urology

## 2020-07-01 ENCOUNTER — Encounter (INDEPENDENT_AMBULATORY_CARE_PROVIDER_SITE_OTHER): Payer: Medicare Other | Admitting: Ophthalmology

## 2020-07-01 ENCOUNTER — Other Ambulatory Visit: Payer: Self-pay

## 2020-07-01 DIAGNOSIS — H33302 Unspecified retinal break, left eye: Secondary | ICD-10-CM

## 2020-07-01 DIAGNOSIS — H35033 Hypertensive retinopathy, bilateral: Secondary | ICD-10-CM

## 2020-07-01 DIAGNOSIS — H34832 Tributary (branch) retinal vein occlusion, left eye, with macular edema: Secondary | ICD-10-CM

## 2020-07-01 DIAGNOSIS — D3131 Benign neoplasm of right choroid: Secondary | ICD-10-CM | POA: Diagnosis not present

## 2020-07-01 DIAGNOSIS — H353132 Nonexudative age-related macular degeneration, bilateral, intermediate dry stage: Secondary | ICD-10-CM

## 2020-07-01 DIAGNOSIS — I10 Essential (primary) hypertension: Secondary | ICD-10-CM

## 2020-07-01 DIAGNOSIS — H43813 Vitreous degeneration, bilateral: Secondary | ICD-10-CM | POA: Diagnosis not present

## 2020-07-12 DIAGNOSIS — M545 Low back pain, unspecified: Secondary | ICD-10-CM | POA: Diagnosis not present

## 2020-07-12 DIAGNOSIS — M5459 Other low back pain: Secondary | ICD-10-CM | POA: Diagnosis not present

## 2020-07-14 DIAGNOSIS — M5459 Other low back pain: Secondary | ICD-10-CM | POA: Diagnosis not present

## 2020-07-19 ENCOUNTER — Ambulatory Visit (HOSPITAL_BASED_OUTPATIENT_CLINIC_OR_DEPARTMENT_OTHER): Admission: RE | Admit: 2020-07-19 | Payer: Medicare Other | Source: Home / Self Care | Admitting: Urology

## 2020-07-19 ENCOUNTER — Encounter (HOSPITAL_BASED_OUTPATIENT_CLINIC_OR_DEPARTMENT_OTHER): Admission: RE | Payer: Self-pay | Source: Home / Self Care

## 2020-07-19 SURGERY — TURP (TRANSURETHRAL RESECTION OF PROSTATE)
Anesthesia: General

## 2020-07-22 DIAGNOSIS — R7309 Other abnormal glucose: Secondary | ICD-10-CM | POA: Diagnosis not present

## 2020-07-22 DIAGNOSIS — Z8349 Family history of other endocrine, nutritional and metabolic diseases: Secondary | ICD-10-CM | POA: Diagnosis not present

## 2020-07-22 DIAGNOSIS — R0982 Postnasal drip: Secondary | ICD-10-CM | POA: Diagnosis not present

## 2020-07-22 DIAGNOSIS — E559 Vitamin D deficiency, unspecified: Secondary | ICD-10-CM | POA: Diagnosis not present

## 2020-07-22 DIAGNOSIS — R413 Other amnesia: Secondary | ICD-10-CM | POA: Diagnosis not present

## 2020-07-22 DIAGNOSIS — I10 Essential (primary) hypertension: Secondary | ICD-10-CM | POA: Diagnosis not present

## 2020-07-22 DIAGNOSIS — R258 Other abnormal involuntary movements: Secondary | ICD-10-CM | POA: Diagnosis not present

## 2020-07-22 DIAGNOSIS — E538 Deficiency of other specified B group vitamins: Secondary | ICD-10-CM | POA: Diagnosis not present

## 2020-07-29 ENCOUNTER — Encounter (INDEPENDENT_AMBULATORY_CARE_PROVIDER_SITE_OTHER): Payer: Medicare Other | Admitting: Ophthalmology

## 2020-07-29 ENCOUNTER — Other Ambulatory Visit: Payer: Self-pay

## 2020-07-29 DIAGNOSIS — H33302 Unspecified retinal break, left eye: Secondary | ICD-10-CM | POA: Diagnosis not present

## 2020-07-29 DIAGNOSIS — H35033 Hypertensive retinopathy, bilateral: Secondary | ICD-10-CM

## 2020-07-29 DIAGNOSIS — I10 Essential (primary) hypertension: Secondary | ICD-10-CM

## 2020-07-29 DIAGNOSIS — H43813 Vitreous degeneration, bilateral: Secondary | ICD-10-CM | POA: Diagnosis not present

## 2020-07-29 DIAGNOSIS — H353132 Nonexudative age-related macular degeneration, bilateral, intermediate dry stage: Secondary | ICD-10-CM

## 2020-07-29 DIAGNOSIS — D3131 Benign neoplasm of right choroid: Secondary | ICD-10-CM

## 2020-07-29 DIAGNOSIS — H34832 Tributary (branch) retinal vein occlusion, left eye, with macular edema: Secondary | ICD-10-CM | POA: Diagnosis not present

## 2020-08-13 DIAGNOSIS — M545 Low back pain, unspecified: Secondary | ICD-10-CM | POA: Diagnosis not present

## 2020-08-20 DIAGNOSIS — M5136 Other intervertebral disc degeneration, lumbar region: Secondary | ICD-10-CM | POA: Diagnosis not present

## 2020-08-20 DIAGNOSIS — M48061 Spinal stenosis, lumbar region without neurogenic claudication: Secondary | ICD-10-CM | POA: Diagnosis not present

## 2020-08-20 DIAGNOSIS — M51369 Other intervertebral disc degeneration, lumbar region without mention of lumbar back pain or lower extremity pain: Secondary | ICD-10-CM

## 2020-08-20 HISTORY — DX: Other intervertebral disc degeneration, lumbar region: M51.36

## 2020-08-20 HISTORY — DX: Spinal stenosis, lumbar region without neurogenic claudication: M48.061

## 2020-08-20 HISTORY — DX: Other intervertebral disc degeneration, lumbar region without mention of lumbar back pain or lower extremity pain: M51.369

## 2020-08-24 DIAGNOSIS — G20A1 Parkinson's disease without dyskinesia, without mention of fluctuations: Secondary | ICD-10-CM

## 2020-08-24 DIAGNOSIS — J019 Acute sinusitis, unspecified: Secondary | ICD-10-CM

## 2020-08-24 DIAGNOSIS — G20 Parkinson's disease: Secondary | ICD-10-CM

## 2020-08-24 HISTORY — DX: Parkinson's disease without dyskinesia, without mention of fluctuations: G20.A1

## 2020-08-24 HISTORY — DX: Parkinson's disease: G20

## 2020-08-24 HISTORY — DX: Acute sinusitis, unspecified: J01.90

## 2020-08-25 ENCOUNTER — Other Ambulatory Visit: Payer: Self-pay | Admitting: Urology

## 2020-08-26 ENCOUNTER — Encounter (INDEPENDENT_AMBULATORY_CARE_PROVIDER_SITE_OTHER): Payer: Medicare Other | Admitting: Ophthalmology

## 2020-08-30 ENCOUNTER — Encounter (INDEPENDENT_AMBULATORY_CARE_PROVIDER_SITE_OTHER): Payer: Medicare Other | Admitting: Ophthalmology

## 2020-08-30 ENCOUNTER — Other Ambulatory Visit: Payer: Self-pay

## 2020-08-30 DIAGNOSIS — H43813 Vitreous degeneration, bilateral: Secondary | ICD-10-CM | POA: Diagnosis not present

## 2020-08-30 DIAGNOSIS — I10 Essential (primary) hypertension: Secondary | ICD-10-CM | POA: Diagnosis not present

## 2020-08-30 DIAGNOSIS — H353132 Nonexudative age-related macular degeneration, bilateral, intermediate dry stage: Secondary | ICD-10-CM

## 2020-08-30 DIAGNOSIS — H33302 Unspecified retinal break, left eye: Secondary | ICD-10-CM

## 2020-08-30 DIAGNOSIS — H35033 Hypertensive retinopathy, bilateral: Secondary | ICD-10-CM

## 2020-08-30 DIAGNOSIS — D3131 Benign neoplasm of right choroid: Secondary | ICD-10-CM | POA: Diagnosis not present

## 2020-08-30 DIAGNOSIS — H34832 Tributary (branch) retinal vein occlusion, left eye, with macular edema: Secondary | ICD-10-CM | POA: Diagnosis not present

## 2020-09-03 DIAGNOSIS — R350 Frequency of micturition: Secondary | ICD-10-CM | POA: Diagnosis not present

## 2020-09-03 DIAGNOSIS — N401 Enlarged prostate with lower urinary tract symptoms: Secondary | ICD-10-CM | POA: Diagnosis not present

## 2020-09-14 DIAGNOSIS — H04123 Dry eye syndrome of bilateral lacrimal glands: Secondary | ICD-10-CM | POA: Diagnosis not present

## 2020-09-14 DIAGNOSIS — Q141 Congenital malformation of retina: Secondary | ICD-10-CM | POA: Diagnosis not present

## 2020-09-14 DIAGNOSIS — H353111 Nonexudative age-related macular degeneration, right eye, early dry stage: Secondary | ICD-10-CM | POA: Diagnosis not present

## 2020-09-14 DIAGNOSIS — Z961 Presence of intraocular lens: Secondary | ICD-10-CM | POA: Diagnosis not present

## 2020-09-14 DIAGNOSIS — D3131 Benign neoplasm of right choroid: Secondary | ICD-10-CM | POA: Diagnosis not present

## 2020-09-14 DIAGNOSIS — H401131 Primary open-angle glaucoma, bilateral, mild stage: Secondary | ICD-10-CM | POA: Diagnosis not present

## 2020-09-14 DIAGNOSIS — H353221 Exudative age-related macular degeneration, left eye, with active choroidal neovascularization: Secondary | ICD-10-CM | POA: Diagnosis not present

## 2020-09-16 NOTE — Progress Notes (Signed)
Assessment/Plan:   1.  Parkinsons Disease, new dx as patient now meeting criteria  -We discussed that it used to be thought that levodopa would increase risk of melanoma but now it is believed that Parkinsons itself likely increases risk of melanoma. he is to get regular skin checks.  -We decided to start carbidopa/levodopa 25/100.  1/2 tab tid x 1 wk, then 1/2 in am & noon & 1 at night for a week, then 1/2 in am &1 at noon &night for a week, then 1 po tid.  Risks, benefits, side effects and alternative therapies were discussed.  The opportunity to ask questions was given and they were answered to the best of my ability.  The patient expressed understanding and willingness to follow the outlined treatment protocols.  -I will refer the patient [facility], for PT.    -We discussed community resources in the area including patient support groups and community exercise programs for PD and pt education was provided to the patient.  2.  F/u 4-5 months   Subjective:   Seth Carter was seen today in follow up for bradykinesia.  When I saw the patient last, he did not meet other criteria for Parkinson's disease, so we decided to go ahead and just follow him.  Outside records that were made available to me were reviewed.  Pt denies falls.   No hallucinations.  Mood has been good.  He saw cardiology in June for lightheadedness and amlodipine was discontinued and a zio patch was placed.  While he had some lightheadedness during it, it was not associated with any arrhythmias.  Pt states that lightheadedness has resolved.  He states that exercise has been limited b/c of LBP and he is following with ortho for that.  occ double vision if looking to the far left.  If he closes an eye it will go away.   ALLERGIES:   Allergies  Allergen Reactions  . Cialis [Tadalafil] Other (See Comments)    Severe back pain  . Milk-Related Compounds Diarrhea    upset  stomach  . Pneumovax [Pneumococcal Polysaccharide Vaccine] Other (See Comments)    Arm swelling and pain  . Simvastatin Other (See Comments)    nightmares  . Ace Inhibitors Cough    CURRENT MEDICATIONS:  Outpatient Encounter Medications as of 09/20/2020  Medication Sig  . Alprostadil (PROSTAGLANDIN E1) POWD by Does not apply route.  Marland Kitchen aspirin EC 81 MG tablet Take 81 mg by mouth daily.  . cholecalciferol (VITAMIN D3) 25 MCG (1000 UNIT) tablet Take 1,000 Units by mouth daily.  . Coenzyme Q10 (CO Q-10) 100 MG CAPS Take 100 mg by mouth daily.  . hydrochlorothiazide (MICROZIDE) 12.5 MG capsule Take 12.5 mg by mouth daily.  . Injection Device MISC 1 mL by Other route as needed.  . latanoprost (XALATAN) 0.005 % ophthalmic solution 1 drop at bedtime.  . Multiple Vitamin (MULTIVITAMIN WITH MINERALS) TABS tablet Take 1 tablet by mouth daily.  . Multiple Vitamins-Minerals (PRESERVISION/LUTEIN) CAPS Take 1 capsule by mouth daily.  . sildenafil (VIAGRA) 100 MG tablet Take 100 mg by mouth daily as needed.  . Magnesium 250 MG TABS Take 1 tablet by mouth daily. (Patient not taking: Reported on 09/20/2020)  . montelukast (SINGULAIR) 10 MG tablet Take 10 mg by mouth at bedtime. (Patient not taking: Reported on 09/20/2020)  . rosuvastatin (CRESTOR) 5 MG tablet Take 5 mg by mouth daily. (Patient not taking: Reported on 09/20/2020)  .  valsartan (DIOVAN) 80 MG tablet Take 80 mg by mouth 3 (three) times daily.  (Patient not taking: Reported on 09/20/2020)   No facility-administered encounter medications on file as of 09/20/2020.    Objective:   PHYSICAL EXAMINATION:    VITALS:   Vitals:   09/20/20 1121  BP: 134/78  Pulse: (!) 59  SpO2: 98%  Weight: 192 lb (87.1 kg)  Height: 5\' 11"  (1.803 m)   Wt Readings from Last 3 Encounters:  09/20/20 192 lb (87.1 kg)  01/16/20 198 lb (89.8 kg)  12/26/19 198 lb (89.8 kg)     GEN:  The patient appears stated age and is in NAD. HEENT:  Normocephalic,  atraumatic.  The mucous membranes are moist.  Neurological examination:  Orientation: The patient is alert and oriented x3. Cranial nerves: There is good facial symmetry with facial hypomimia. The speech is fluent and clear. Soft palate rises symmetrically and there is no tongue deviation. Hearing is intact to conversational tone. Sensation: Sensation is intact to light touch throughout Motor: Strength is at least antigravity x4.  Movement examination: Tone: There is mild to mod increased tone in the LUE Abnormal movements: none Coordination:  There is mild decremation with RAM's, with any form of RAMS, including alternating supination and pronation of the forearm, hand opening and closing, finger taps, heel taps and toe taps on the L Gait and Station: The patient has no difficulty arising out of a deep-seated chair without the use of the hands. The patient's stride length is decreased with decreased arm swing bilaterally.      Total time spent on today's visit was 40 minutes, including both face-to-face time and nonface-to-face time.  Time included that spent on review of records (prior notes available to me/labs/imaging if pertinent), discussing treatment and goals, answering patient's questions and coordinating care.  Cc:  [name], MD

## 2020-09-17 DIAGNOSIS — J0100 Acute maxillary sinusitis, unspecified: Secondary | ICD-10-CM | POA: Diagnosis not present

## 2020-09-20 ENCOUNTER — Ambulatory Visit (INDEPENDENT_AMBULATORY_CARE_PROVIDER_SITE_OTHER): Payer: Medicare Other | Admitting: Neurology

## 2020-09-20 ENCOUNTER — Encounter: Payer: Self-pay | Admitting: Neurology

## 2020-09-20 ENCOUNTER — Other Ambulatory Visit: Payer: Self-pay

## 2020-09-20 ENCOUNTER — Other Ambulatory Visit (HOSPITAL_COMMUNITY)
Admission: RE | Admit: 2020-09-20 | Discharge: 2020-09-20 | Disposition: A | Discharge status: Home / Self Care | Payer: Medicare Other | Source: Ambulatory Visit | Attending: Urology | Admitting: Urology

## 2020-09-20 VITALS — BP 134/78 | HR 59 | Ht 71.0 in | Wt 192.0 lb

## 2020-09-20 DIAGNOSIS — Z01812 Encounter for preprocedural laboratory examination: Secondary | ICD-10-CM | POA: Diagnosis not present

## 2020-09-20 DIAGNOSIS — G20 Parkinson's disease: Secondary | ICD-10-CM

## 2020-09-20 DIAGNOSIS — Z20822 Contact with and (suspected) exposure to covid-19: Secondary | ICD-10-CM | POA: Insufficient documentation

## 2020-09-20 MED ORDER — CARBIDOPA-LEVODOPA 25-100 MG PO TABS: 1.0000 | ORAL_TABLET | Freq: Three times a day (TID) | ORAL | 1 refills | Status: DC

## 2020-09-20 NOTE — Patient Instructions (Addendum)
1.  Start Carbidopa Levodopa as follows:  Take 1/2 tablet three times daily, at least 30 minutes before meals (approximately 7am/11am/4pm), for one week  Then take 1/2 tablet in the morning, 1/2 tablet in the afternoon, 1 tablet in the evening, at least 30 minutes before meals, for one week  Then take 1/2 tablet in the morning, 1 tablet in the afternoon, 1 tablet in the evening, at least 30 minutes before meals, for one week  Then take 1 tablet three times daily at 7am/11am/4pm, at least 30 minutes before meals   As a reminder, carbidopa/levodopa can be taken at the same time as a carbohydrate, but we like to have you take your pill either 30 minutes before a protein source or 1 hour after as protein can interfere with carbidopa/levodopa absorption.  2.  As we discussed, it used to be thought that levodopa would increase risk of melanoma but now it is believed that Parkinsons itself likely increases risk of melanoma. I recommend yearly skin checks with a board certified dermatologist.  You can call [facility] or [facility] for an appointment.  [facility] Address: [address] [place], [place], [place] [address] Phone: [phone]  [facility] [address], [place], [place] [address] Phone: [phone]  3.  You have been referred to Neuro Rehab for therapy. They will call you directly to schedule an appointment.  Please call [phone] if you do not hear from them.    The physicians and staff at [facility] are committed to providing excellent care. You may receive a survey requesting feedback about your experience at our office. We strive to receive "very good" responses to the survey questions. If you feel that your experience would prevent you from giving the office a "very good " response, please contact our office to try to remedy the situation. We may be reached at [phone]. Thank you for  taking the time out of your busy day to complete the survey.

## 2020-09-21 LAB — SARS CORONAVIRUS 2 (TAT 6-24 HRS): SARS Coronavirus 2: NEGATIVE

## 2020-09-22 ENCOUNTER — Encounter (HOSPITAL_BASED_OUTPATIENT_CLINIC_OR_DEPARTMENT_OTHER): Payer: Self-pay | Admitting: Urology

## 2020-09-22 ENCOUNTER — Other Ambulatory Visit: Payer: Self-pay

## 2020-09-22 NOTE — H&P (Signed)
H&P  Chief Complaint: Difficulty urinating  History of Present Illness: 80 yo male w/ persistent LUTS despite h/o 2 minimally invasive prostate procedures (CTT, Urolift) presenting for TURP for mgmt.  Past Medical History:  Diagnosis Date  . Allergic rhinitis, seasonal   . B12 deficiency   . BPH with obstruction/lower urinary tract symptoms   . Bradykinesia    neruologist--- dr tat  . Chronic back pain   . Chronically dry eyes   . DDD (degenerative disc disease), lumbar   . ED (erectile dysfunction)   . Essential hypertension    nuclear test in epic 01-16-2020 normal perfusion no ischemia, normal lvf and wall motion, nuclear ef 65%  . Family history of hemochromatosis   . Fatigue   . Generalized weakness   . GERD (gastroesophageal reflux disease)   . Glaucoma, both eyes   . Hemorrhoids   . History of chronic sinusitis   . History of kidney stones   . History of syncope    cardiology evaluation by dr Marlou Porch, note in epic 12-26-2019, work-up includes event monitor 02-04-2020( showed SR with occ PAC & PVC rare PAT)/ echo  12-26-2019 (mild LVH, ef 50-55%, moderate MR, mild to moderate AV sclerosis no stenosis, ascending aorta 37mm) and nuclear test 01-16-2020 (normal perfusion w/ normal LV and wall motion, nuclear ef 65%)  . Hypogonadism in male   . Macular degeneration, right eye   . Mixed hyperlipidemia   . OA (osteoarthritis)   . Other spondylosis with radiculopathy, cervical region   . Parkinson's disease [facility]) 08/2020   neurologist--- dr tat--  newly dx 02/ 2022  . Sinusitis, acute 08/2020   per pt started antibiotic on 09-20-2020, has cough nasal congestion, cough, no fever  . Sting of hornets, wasps, and bees causing poisoning and toxic reactions   . Vitamin D deficiency   . Wears glasses     Past Surgical History:  Procedure Laterality Date  . ANKLE SURGERY Right 10-04-2015  @[facility]    debridement achilles tendon & reattachment and heel excision osteophyte  . CATARACT  EXTRACTION W/ INTRAOCULAR LENS  IMPLANT, BILATERAL  yrs ago  . CYSTOSCOPY WITH INSERTION OF UROLIFT  2019 approx.  Marland Kitchen SHOULDER SURGERY Right 1990's  . TONSILLECTOMY  age 40    Home Medications:  Allergies as of 09/22/2020      Reactions   Cialis [tadalafil] Other (See Comments)   Severe back pain   Milk-related Compounds Diarrhea   upset stomach   Pneumovax [pneumococcal Polysaccharide Vaccine] Other (See Comments)   Arm swelling and pain   Simvastatin Other (See Comments)   nightmares   Ace Inhibitors Cough      Medication List    Notice   Cannot display discharge medications because the patient has not yet been admitted.     Allergies:  Allergies  Allergen Reactions  . Cialis [Tadalafil] Other (See Comments)    Severe back pain  . Milk-Related Compounds Diarrhea    upset stomach  . Pneumovax [Pneumococcal Polysaccharide Vaccine] Other (See Comments)    Arm swelling and pain  . Simvastatin Other (See Comments)    nightmares  . Ace Inhibitors Cough    Family History  Problem Relation Age of Onset  . Cancer Mother   . Heart attack Father   . Heart disease Sister   . Other Sister        Polio    Social History:  reports that he has never smoked. He has never used smokeless tobacco.  He reports current alcohol use of about 5.0 - 6.0 standard drinks of alcohol per week. He reports that he does not use drugs.  ROS: A complete review of systems was performed.  All systems are negative except for pertinent findings as noted.  Physical Exam:  Vital signs in last 24 hours: Ht 5\' 11"  (1.803 m)   Wt 83.9 kg   BMI 25.80 kg/m  Constitutional:  Alert and oriented, No acute distress Cardiovascular: Regular rate  Respiratory: Normal respiratory effort GI: Abdomen is soft, nontender, nondistended, no abdominal masses. No CVAT.  Genitourinary: Normal male phallus, testes are descended bilaterally and non-tender and without masses, scrotum is normal in appearance without  lesions or masses, perineum is normal on inspection. Lymphatic: No lymphadenopathy Neurologic: Grossly intact, no focal deficits Psychiatric: Normal mood and affect  Laboratory Data:  No results for input(s): WBC, HGB, HCT, PLT in the last 72 hours.  No results for input(s): NA, K, CL, GLUCOSE, BUN, CALCIUM, CREATININE in the last 72 hours.  Invalid input(s): CO3   No results found for this or any previous visit (from the past 24 hour(s)). Recent Results (from the past 240 hour(s))  SARS CORONAVIRUS 2 (TAT 6-24 HRS) Nasopharyngeal Nasopharyngeal Swab     Status: None   Collection Time: 09/20/20 12:36 PM   Specimen: Nasopharyngeal Swab  Result Value Ref Range Status   SARS Coronavirus 2 NEGATIVE NEGATIVE Final    Comment: (NOTE) SARS-CoV-2 target nucleic acids are NOT DETECTED.  The SARS-CoV-2 RNA is generally detectable in upper and lower respiratory specimens during the acute phase of infection. Negative results do not preclude SARS-CoV-2 infection, do not rule out co-infections with other pathogens, and should not be used as the sole basis for treatment or other patient management decisions. Negative results must be combined with clinical observations, patient history, and epidemiological information. The expected result is Negative.  Fact Sheet for Patients: SugarRoll.be  Fact Sheet for Healthcare Providers: https://www.woods-mathews.com/  This test is not yet approved or cleared by the [place] FDA and  has been authorized for detection and/or diagnosis of SARS-CoV-2 by FDA under an Emergency Use Authorization (EUA). This EUA will remain  in effect (meaning this test can be used) for the duration of the COVID-19 declaration under Se ction 564(b)(1) of the Act, 21 U.S.C. section 360bbb-3(b)(1), unless the authorization is terminated or revoked sooner.  Performed at [facility], [place] [address] [place],  [place] [address]     Renal Function: No results for input(s): CREATININE in the last 168 hours. CrCl cannot be calculated (No successful lab value found.).  Radiologic Imaging: No results found.  Impression/Assessment:  BPH w/ LUTS  Plan:  TURP

## 2020-09-22 NOTE — Progress Notes (Addendum)
Spoke w/ via phone for pre-op interview--- PT Lab needs dos----  Istat             Lab results------ current ekg in epic/ chart COVID test ------ done 09-20-2020 negative result in epic Arrive at ------- 0730 on 09-23-2020 NPO after MN NO Solid Food.  Clear liquids from MN until--- 0630 Medications to take morning of surgery ----- NONE Diabetic medication ----- n/a Patient Special Instructions -----reviewed RCC and visitor guidelines  Pre-Op special Istructions ----- n/a Patient verbalized understanding of instructions that were given at this phone interview. Patient denies shortness of breath, chest pain, fever, at this phone interview.   Anesthesia :  HTN;  Newly dx Parkinson's disease 09-20-2020 (pt stated will note start sinemet until after surgery);  Pt had cardiology work-up for near syncope, chest tightness, dizziness (pt released on prn basis since work-up was okay);  Pt denied any cardiac s&s, sob,  no peripheral swelling.  Recently dx with acute sinusitis, started antibiotic 09-20-2020.  Pt stated has nasal congestion, cough, and no fever (during phone call pt never coughed and did not sound congested).  PCP:  Dr Inda Merlin Cardiologist : Dr Marlou Porch (lov 12-26-2019 epic) Neurologist:  [name] Cassell Clement 09-20-2020 epic) Chest x-ray :  no EKG : 12-26-2019 epic Event monitor:  02-04-2020 epic Echo : 01-16-2020 epic Stress test:  Nuclear 01-16-2020 epic Cardiac Cath :  no Activity level:  Denies sob w/ any activity Sleep Study/ CPAP :  NO Fasting Blood Sugar :      / Checks Blood Sugar -- times a day:   N/A Blood Thinner/ Instructions /Last Dose: NO ASA / Instructions/ Last Dose :  ASA 81mg /  Per pt was told to stop asa 5 days prior to surgery, stated last dose 09-19-2020

## 2020-09-23 ENCOUNTER — Encounter (HOSPITAL_BASED_OUTPATIENT_CLINIC_OR_DEPARTMENT_OTHER)
Admission: RE | Disposition: A | Discharge status: Home / Self Care | Payer: Self-pay | Source: Home / Self Care | Attending: Urology

## 2020-09-23 ENCOUNTER — Encounter (HOSPITAL_BASED_OUTPATIENT_CLINIC_OR_DEPARTMENT_OTHER): Payer: Self-pay | Admitting: Urology

## 2020-09-23 ENCOUNTER — Ambulatory Visit (HOSPITAL_BASED_OUTPATIENT_CLINIC_OR_DEPARTMENT_OTHER): Payer: Medicare Other | Admitting: Anesthesiology

## 2020-09-23 ENCOUNTER — Observation Stay (HOSPITAL_BASED_OUTPATIENT_CLINIC_OR_DEPARTMENT_OTHER)
Admission: RE | Admit: 2020-09-23 | Discharge: 2020-09-24 | Disposition: A | Discharge status: Home / Self Care | Payer: Medicare Other | Attending: Urology | Admitting: Urology

## 2020-09-23 DIAGNOSIS — Z79899 Other long term (current) drug therapy: Secondary | ICD-10-CM | POA: Insufficient documentation

## 2020-09-23 DIAGNOSIS — N400 Enlarged prostate without lower urinary tract symptoms: Secondary | ICD-10-CM | POA: Diagnosis not present

## 2020-09-23 DIAGNOSIS — N320 Bladder-neck obstruction: Secondary | ICD-10-CM | POA: Insufficient documentation

## 2020-09-23 DIAGNOSIS — N138 Other obstructive and reflux uropathy: Secondary | ICD-10-CM | POA: Insufficient documentation

## 2020-09-23 DIAGNOSIS — E782 Mixed hyperlipidemia: Secondary | ICD-10-CM | POA: Diagnosis not present

## 2020-09-23 DIAGNOSIS — E559 Vitamin D deficiency, unspecified: Secondary | ICD-10-CM | POA: Diagnosis not present

## 2020-09-23 DIAGNOSIS — N401 Enlarged prostate with lower urinary tract symptoms: Secondary | ICD-10-CM

## 2020-09-23 DIAGNOSIS — G20 Parkinson's disease: Secondary | ICD-10-CM | POA: Insufficient documentation

## 2020-09-23 DIAGNOSIS — I10 Essential (primary) hypertension: Secondary | ICD-10-CM | POA: Insufficient documentation

## 2020-09-23 HISTORY — DX: Other chronic pain: G89.29

## 2020-09-23 HISTORY — DX: Other intervertebral disc degeneration, lumbar region: M51.36

## 2020-09-23 HISTORY — DX: Essential (primary) hypertension: I10

## 2020-09-23 HISTORY — DX: Unspecified hemorrhoids: K64.9

## 2020-09-23 HISTORY — DX: Presence of spectacles and contact lenses: Z97.3

## 2020-09-23 HISTORY — DX: Gastro-esophageal reflux disease without esophagitis: K21.9

## 2020-09-23 HISTORY — DX: Other seasonal allergic rhinitis: J30.2

## 2020-09-23 HISTORY — PX: TRANSURETHRAL RESECTION OF PROSTATE: SHX73

## 2020-09-23 HISTORY — DX: Benign prostatic hyperplasia with lower urinary tract symptoms: N40.1

## 2020-09-23 HISTORY — DX: Personal history of urinary calculi: Z87.442

## 2020-09-23 HISTORY — DX: Weakness: R53.1

## 2020-09-23 HISTORY — DX: Unspecified glaucoma: H40.9

## 2020-09-23 HISTORY — DX: Unspecified macular degeneration: H35.30

## 2020-09-23 HISTORY — DX: Dry eye syndrome of unspecified lacrimal gland: H04.129

## 2020-09-23 HISTORY — DX: Personal history of other diseases of the respiratory system: Z87.09

## 2020-09-23 HISTORY — DX: Deficiency of other specified B group vitamins: E53.8

## 2020-09-23 HISTORY — DX: Vitamin D deficiency, unspecified: E55.9

## 2020-09-23 HISTORY — DX: Personal history of other specified conditions: Z87.898

## 2020-09-23 HISTORY — DX: Unspecified osteoarthritis, unspecified site: M19.90

## 2020-09-23 LAB — POCT I-STAT, CHEM 8
BUN: 25 mg/dL — ABNORMAL HIGH (ref 8–23)
Calcium, Ion: 1.34 mmol/L (ref 1.15–1.40)
Chloride: 105 mmol/L (ref 98–111)
Creatinine, Ser: 1 mg/dL (ref 0.61–1.24)
Glucose, Bld: 126 mg/dL — ABNORMAL HIGH (ref 70–99)
HCT: 42 % (ref 39.0–52.0)
Hemoglobin: 14.3 g/dL (ref 13.0–17.0)
Potassium: 3.7 mmol/L (ref 3.5–5.1)
Sodium: 144 mmol/L (ref 135–145)
TCO2: 26 mmol/L (ref 22–32)

## 2020-09-23 SURGERY — TURP (TRANSURETHRAL RESECTION OF PROSTATE)
Anesthesia: General | Site: Prostate

## 2020-09-23 MED ORDER — DEXAMETHASONE SODIUM PHOSPHATE 10 MG/ML IJ SOLN
INTRAMUSCULAR | Status: DC | PRN
  Administered 2020-09-23: 5 mg via INTRAVENOUS

## 2020-09-23 MED ORDER — LIDOCAINE HCL (PF) 2 % IJ SOLN
INTRAMUSCULAR | Status: AC
  Filled 2020-09-23: qty 5

## 2020-09-23 MED ORDER — FENTANYL CITRATE (PF) 100 MCG/2ML IJ SOLN
INTRAMUSCULAR | Status: DC | PRN
  Administered 2020-09-23: 75 ug via INTRAVENOUS

## 2020-09-23 MED ORDER — HYDROCHLOROTHIAZIDE 12.5 MG PO CAPS: 12.5000 mg | ORAL_CAPSULE | Freq: Every day | ORAL | Status: DC

## 2020-09-23 MED ORDER — OXYCODONE HCL 5 MG PO TABS: 5.0000 mg | ORAL_TABLET | Freq: Once | ORAL | Status: DC | PRN

## 2020-09-23 MED ORDER — HYDRALAZINE HCL 20 MG/ML IJ SOLN
5.0000 mg | INTRAMUSCULAR | Status: DC
  Administered 2020-09-23 (×2): 5 mg via INTRAVENOUS

## 2020-09-23 MED ORDER — CEFAZOLIN SODIUM-DEXTROSE 2-4 GM/100ML-% IV SOLN
INTRAVENOUS | Status: AC
  Filled 2020-09-23: qty 100

## 2020-09-23 MED ORDER — CEFAZOLIN SODIUM-DEXTROSE 2-4 GM/100ML-% IV SOLN
2.0000 g | INTRAVENOUS | Status: AC
  Administered 2020-09-23: 2 g via INTRAVENOUS

## 2020-09-23 MED ORDER — CARBIDOPA-LEVODOPA 25-100 MG PO TABS
1.0000 | ORAL_TABLET | Freq: Three times a day (TID) | ORAL | Status: DC
  Filled 2020-09-23: qty 1

## 2020-09-23 MED ORDER — SENNA 8.6 MG PO TABS
1.0000 | ORAL_TABLET | Freq: Two times a day (BID) | ORAL | Status: DC
  Administered 2020-09-23 (×2): 8.6 mg via ORAL

## 2020-09-23 MED ORDER — ONDANSETRON HCL 4 MG/2ML IJ SOLN: 4.0000 mg | INTRAMUSCULAR | Status: DC | PRN

## 2020-09-23 MED ORDER — SODIUM CHLORIDE 0.9 % IR SOLN
Status: DC | PRN
  Administered 2020-09-23: 3000 mL via INTRAVESICAL
  Administered 2020-09-23 (×3): 6000 mL via INTRAVESICAL
  Administered 2020-09-23: 3000 mL via INTRAVESICAL

## 2020-09-23 MED ORDER — HYDRALAZINE HCL 20 MG/ML IJ SOLN
INTRAMUSCULAR | Status: AC
  Filled 2020-09-23: qty 1

## 2020-09-23 MED ORDER — LATANOPROST 0.005 % OP SOLN
1.0000 [drp] | Freq: Every day | OPHTHALMIC | Status: DC
  Filled 2020-09-23: qty 2.5

## 2020-09-23 MED ORDER — LACTATED RINGERS IV SOLN: INTRAVENOUS | Status: DC

## 2020-09-23 MED ORDER — EPHEDRINE SULFATE-NACL 50-0.9 MG/10ML-% IV SOSY
PREFILLED_SYRINGE | INTRAVENOUS | Status: DC | PRN
  Administered 2020-09-23: 10 mg via INTRAVENOUS

## 2020-09-23 MED ORDER — SULFAMETHOXAZOLE-TRIMETHOPRIM 800-160 MG PO TABS
1.0000 | ORAL_TABLET | Freq: Two times a day (BID) | ORAL | Status: DC
  Administered 2020-09-23 (×2): 1 via ORAL
  Filled 2020-09-23 (×3): qty 1

## 2020-09-23 MED ORDER — OXYCODONE HCL 5 MG/5ML PO SOLN: 5.0000 mg | Freq: Once | ORAL | Status: DC | PRN

## 2020-09-23 MED ORDER — SODIUM CHLORIDE 0.9 % IR SOLN
3000.0000 mL | Status: DC
  Administered 2020-09-23 – 2020-09-24 (×4): 3000 mL

## 2020-09-23 MED ORDER — ONDANSETRON HCL 4 MG/2ML IJ SOLN
INTRAMUSCULAR | Status: DC | PRN
  Administered 2020-09-23: 4 mg via INTRAVENOUS

## 2020-09-23 MED ORDER — SODIUM CHLORIDE 0.45 % IV SOLN: INTRAVENOUS | Status: DC

## 2020-09-23 MED ORDER — EPHEDRINE 5 MG/ML INJ
INTRAVENOUS | Status: AC
  Filled 2020-09-23: qty 10

## 2020-09-23 MED ORDER — SENNA 8.6 MG PO TABS
ORAL_TABLET | ORAL | Status: AC
  Filled 2020-09-23: qty 1

## 2020-09-23 MED ORDER — BELLADONNA ALKALOIDS-OPIUM 16.2-60 MG RE SUPP: 1.0000 | Freq: Four times a day (QID) | RECTAL | Status: DC | PRN

## 2020-09-23 MED ORDER — PROPOFOL 10 MG/ML IV BOLUS
INTRAVENOUS | Status: DC | PRN
  Administered 2020-09-23: 110 mg via INTRAVENOUS
  Administered 2020-09-23: 50 mg via INTRAVENOUS

## 2020-09-23 MED ORDER — DORZOLAMIDE HCL 2 % OP SOLN
1.0000 [drp] | Freq: Two times a day (BID) | OPHTHALMIC | Status: DC
  Filled 2020-09-23: qty 10

## 2020-09-23 MED ORDER — ONDANSETRON HCL 4 MG/2ML IJ SOLN
INTRAMUSCULAR | Status: AC
  Filled 2020-09-23: qty 2

## 2020-09-23 MED ORDER — ROSUVASTATIN CALCIUM 5 MG PO TABS: 5.0000 mg | ORAL_TABLET | Freq: Every day | ORAL | Status: DC

## 2020-09-23 MED ORDER — LIDOCAINE 2% (20 MG/ML) 5 ML SYRINGE
INTRAMUSCULAR | Status: DC | PRN
  Administered 2020-09-23: 80 mg via INTRAVENOUS

## 2020-09-23 MED ORDER — DEXAMETHASONE SODIUM PHOSPHATE 10 MG/ML IJ SOLN
INTRAMUSCULAR | Status: AC
  Filled 2020-09-23: qty 1

## 2020-09-23 MED ORDER — HYDROCODONE-ACETAMINOPHEN 5-325 MG PO TABS: 1.0000 | ORAL_TABLET | ORAL | Status: DC | PRN

## 2020-09-23 MED ORDER — FENTANYL CITRATE (PF) 100 MCG/2ML IJ SOLN
INTRAMUSCULAR | Status: AC
  Filled 2020-09-23: qty 2

## 2020-09-23 MED ORDER — PROPOFOL 10 MG/ML IV BOLUS
INTRAVENOUS | Status: AC
  Filled 2020-09-23: qty 20

## 2020-09-23 MED ORDER — ONDANSETRON HCL 4 MG/2ML IJ SOLN: 4.0000 mg | Freq: Once | INTRAMUSCULAR | Status: DC | PRN

## 2020-09-23 MED ORDER — ACETAMINOPHEN 325 MG PO TABS
650.0000 mg | ORAL_TABLET | ORAL | Status: DC | PRN
  Administered 2020-09-24: 650 mg via ORAL

## 2020-09-23 MED ORDER — FENTANYL CITRATE (PF) 100 MCG/2ML IJ SOLN: 25.0000 ug | INTRAMUSCULAR | Status: DC | PRN

## 2020-09-23 MED ORDER — IRBESARTAN 75 MG PO TABS
37.5000 mg | ORAL_TABLET | Freq: Every day | ORAL | Status: DC
  Administered 2020-09-23: 37.5 mg via ORAL
  Filled 2020-09-23: qty 0.5

## 2020-09-23 SURGICAL SUPPLY — 32 items
BAG DRAIN URO-CYSTO SKYTR STRL (DRAIN) ×2 IMPLANT
BAG DRN RND TRDRP ANRFLXCHMBR (UROLOGICAL SUPPLIES) ×1
BAG DRN UROCATH (DRAIN) ×1
BAG URINE DRAIN 2000ML AR STRL (UROLOGICAL SUPPLIES) ×2 IMPLANT
BAG URINE LEG 500ML (DRAIN) IMPLANT
CATH FOLEY 2WAY SLVR  5CC 20FR (CATHETERS)
CATH FOLEY 2WAY SLVR  5CC 22FR (CATHETERS)
CATH FOLEY 2WAY SLVR 30CC 22FR (CATHETERS) IMPLANT
CATH FOLEY 2WAY SLVR 5CC 20FR (CATHETERS) IMPLANT
CATH FOLEY 2WAY SLVR 5CC 22FR (CATHETERS) IMPLANT
CATH FOLEY 3WAY 30CC 22FR (CATHETERS) IMPLANT
CATH HEMA 3WAY 30CC 22FR COUDE (CATHETERS) ×1 IMPLANT
CATH HEMA 3WAY 30CC 24FR COUDE (CATHETERS) IMPLANT
CATH HEMA 3WAY 30CC 24FR RND (CATHETERS) IMPLANT
CLOTH BEACON ORANGE TIMEOUT ST (SAFETY) ×2 IMPLANT
ELECT REM PT RETURN 9FT ADLT (ELECTROSURGICAL)
ELECTRODE REM PT RTRN 9FT ADLT (ELECTROSURGICAL) ×1 IMPLANT
EVACUATOR MICROVAS BLADDER (UROLOGICAL SUPPLIES) ×1 IMPLANT
GLOVE SURG ENC MOIS LTX SZ8 (GLOVE) ×2 IMPLANT
GOWN STRL REUS W/TWL XL LVL3 (GOWN DISPOSABLE) ×2 IMPLANT
HOLDER FOLEY CATH W/STRAP (MISCELLANEOUS) ×1 IMPLANT
IV NS IRRIG 3000ML ARTHROMATIC (IV SOLUTION) ×10 IMPLANT
KIT TURNOVER CYSTO (KITS) ×2 IMPLANT
LOOP CUT BIPOLAR 24F LRG (ELECTROSURGICAL) ×3 IMPLANT
MANIFOLD NEPTUNE II (INSTRUMENTS) ×2 IMPLANT
PACK CYSTO (CUSTOM PROCEDURE TRAY) ×2 IMPLANT
PLUG CATH AND CAP STER (CATHETERS) IMPLANT
SYR 30ML LL (SYRINGE) ×1 IMPLANT
SYR TOOMEY IRRIG 70ML (MISCELLANEOUS)
SYRINGE TOOMEY IRRIG 70ML (MISCELLANEOUS) IMPLANT
TUBE CONNECTING 12X1/4 (SUCTIONS) ×2 IMPLANT
TUBING UROLOGY SET (TUBING) ×1 IMPLANT

## 2020-09-23 NOTE — Op Note (Signed)
Preoperative diagnosis: 1. Bladder outlet obstruction secondary to BPH  Postoperative diagnosis:  1. Bladder outlet obstruction secondary to BPH  Procedure:  1. Cystoscopy 2. Transurethral resection of the prostate  Surgeon: Lillette Boxer. Zamiah Tollett, M.D.  Anesthesia: General  Complications: None  Drain: Foley catheter  EBL: Minimal  Specimens: 1. Prostate chips  Disposition of specimens: Pathology  Indication: Seth Carter is a patient with bladder outlet obstruction secondary to benign prostatic hyperplasia. After reviewing the management options for treatment, he elected to proceed with the above surgical procedure(s). We have discussed the potential benefits and risks of the procedure, side effects of the proposed treatment, the likelihood of the patient achieving the goals of the procedure, and any potential problems that might occur during the procedure or recuperation. Informed consent has been obtained.  Description of procedure:  The patient was identified in the holding area. He received preoperative antibiotics. He was then taken to the operating room. General anesthetic was administered.  The patient was then placed in the dorsal lithotomy position, prepped and draped in the usual sterile fashion. Timeout was then performed.  A resectoscope sheath was placed using the visual obturator.   The bladder was then systematically examined in its entirety. There was no evidence of  tumors, stones, or other mucosal pathology.The resectoscope, loop and telescope were then placed.  The ureteral orifices were identified so as to be avoided during the procedure.  The prostate adenoma was then resected utilizing loop cautery resection with the bipolar cutting loop.  The prostate adenoma from the bladder neck back to the verumontanum was resected beginning at the six o'clock position and then extended to include the right and left lobes of the prostate and anterior prostate,  respectively. Care was taken not to resect distal to the verumontanum.  Urolift implants encountered during resection--visible implants resected/removed.  Hemostasis was then achieved with the cautery and the bladder was emptied and reinspected with no significant bleeding noted at the end of the procedure.  Resected chips were irrigated from the bladder with the evacuator and sent to pathology.  A 3 way catheter was then placed into the bladder and placed on continuous bladder irrigation.  The patient appeared to tolerate the procedure well and without complications. The patient was able to be awakened and transferred to the recovery unit in satisfactory condition. He tolerated the procedure well.

## 2020-09-23 NOTE — Anesthesia Preprocedure Evaluation (Addendum)
Anesthesia Evaluation  Patient identified by MRN, date of birth, ID band Patient awake    Reviewed: Allergy & Precautions, NPO status , Patient's Chart, lab work & pertinent test results, reviewed documented beta blocker date and time   Airway Mallampati: II  TM Distance: >3 FB Neck ROM: Full    Dental no notable dental hx. (+) Dental Advisory Given, Caps, Teeth Intact   Pulmonary neg pulmonary ROS,    Pulmonary exam normal breath sounds clear to auscultation       Cardiovascular hypertension, Pt. on medications  Rhythm:Regular Rate:Bradycardia  EKG 12/2019 SB otherwise normal  Echo 01/16/20 1. Global longitudinal strain is -20%.  2. Left ventricular ejection fraction, by estimation, is 55 to 60%. The left ventricle has normal function. The left ventricle has no regional wall motion abnormalities. There is mild left ventricular hypertrophy. Left ventricular diastolic parameters were normal.  3. Right ventricular systolic function is normal. The right ventricular size is normal.  4. The mitral valve is normal in structure. Mild mitral valve  regurgitation.  5. The aortic valve is abnormal. Aortic valve regurgitation is trivial. Mild to moderate aortic valve sclerosis/calcification is present, without any evidence of aortic stenosis.  6. Aortic dilatation noted. There is mild dilatation of the ascending aorta measuring 39 mm.  7. The inferior vena cava is normal in size with greater than 50% respiratory variability, suggesting right atrial pressure of 3 mmHg.   Myocardial Perfusion Scan 01/16/20  Nuclear stress EF: 65%.  There was no ST segment deviation noted during stress.  The study is normal.  This is a low risk study.  The left ventricular ejection fraction is normal (55-65%).   Normal stress nuclear study with no ischemia or infarction.  Gated ejection fraction 65% with normal wall motion.     Neuro/Psych Parkinson's disease Glaucoma ou Macular degeneration od  Neuromuscular disease negative psych ROS   GI/Hepatic Neg liver ROS, GERD  Medicated and Controlled,  Endo/Other  Hyperlipidemia  Renal/GU Renal diseaseHx/o renal calculi   BPH with obstruction    Musculoskeletal  (+) Arthritis , Osteoarthritis,  DDD lumbar spine   Abdominal   Peds  Hematology negative hematology ROS (+)   Anesthesia Other Findings   Reproductive/Obstetrics                            Anesthesia Physical Anesthesia Plan  ASA: II  Anesthesia Plan: General   Post-op Pain Management:    Induction: Intravenous  PONV Risk Score and Plan: 4 or greater and Treatment may vary due to age or medical condition and Ondansetron  Airway Management Planned: LMA  Additional Equipment: None  Intra-op Plan:   Post-operative Plan: Extubation in OR  Informed Consent: I have reviewed the patients History and Physical, chart, labs and discussed the procedure including the risks, benefits and alternatives for the proposed anesthesia with the patient or authorized representative who has indicated his/her understanding and acceptance.     Dental advisory given  Plan Discussed with: CRNA and Anesthesiologist  Anesthesia Plan Comments:         Anesthesia Quick Evaluation

## 2020-09-23 NOTE — Anesthesia Procedure Notes (Signed)
Procedure Name: LMA Insertion Date/Time: 09/23/2020 9:29 AM Performed by: Suan Halter, CRNA Pre-anesthesia Checklist: Patient identified, Emergency Drugs available, Suction available and Patient being monitored Patient Re-evaluated:Patient Re-evaluated prior to induction Oxygen Delivery Method: Circle system utilized Preoxygenation: Pre-oxygenation with 100% oxygen Induction Type: IV induction Ventilation: Mask ventilation without difficulty LMA: LMA inserted LMA Size: 5.0 Number of attempts: 1 Airway Equipment and Method: Bite block Placement Confirmation: positive ETCO2 Tube secured with: Tape Dental Injury: Teeth and Oropharynx as per pre-operative assessment

## 2020-09-23 NOTE — Transfer of Care (Signed)
Immediate Anesthesia Transfer of Care Note  Patient: Seth Carter  Procedure(s) Performed: Procedure(s) (LRB): TRANSURETHRAL RESECTION OF THE PROSTATE (TURP) (N/A)  Patient Location: PACU  Anesthesia Type: General  Level of Consciousness: awake, oriented, sedated and patient cooperative  Airway & Oxygen Therapy: Patient Spontanous Breathing and Patient connected to face mask oxygen  Post-op Assessment: Report given to PACU RN and Post -op Vital signs reviewed and stable  Post vital signs: Reviewed and stable  Complications: No apparent anesthesia complications Last Vitals:  Vitals Value Taken Time  BP 199/88 09/23/20 1045  Temp 36.4 C 09/23/20 1045  Pulse 51 09/23/20 1046  Resp 13 09/23/20 1046  SpO2 96 % 09/23/20 1046  Vitals shown include unvalidated device data.  Last Pain:  Vitals:   09/23/20 0816  TempSrc: Oral  PainSc: 0-No pain      Patients Stated Pain Goal: 3 (47/34/03 7096)  Complications: No complications documented.

## 2020-09-23 NOTE — Anesthesia Postprocedure Evaluation (Signed)
Anesthesia Post Note  Patient: [name]  Procedure(s) Performed: TRANSURETHRAL RESECTION OF THE PROSTATE (TURP) (N/A Prostate)     Patient location during evaluation: PACU Anesthesia Type: General Level of consciousness: awake and alert and oriented Pain management: pain level controlled Vital Signs Assessment: post-procedure vital signs reviewed and stable Respiratory status: spontaneous breathing, nonlabored ventilation and respiratory function stable Cardiovascular status: blood pressure returned to baseline and stable Postop Assessment: no apparent nausea or vomiting Anesthetic complications: no   No complications documented.  Last Vitals:  Vitals:   09/23/20 1115 09/23/20 1130  BP: (!) 197/85 (!) 186/90  Pulse: (!) 54 (!) 57  Resp: 15 10  Temp:    SpO2: 100% 100%    Last Pain:  Vitals:   09/23/20 1115  TempSrc:   PainSc: 0-No pain                 Jaymien Landin A.

## 2020-09-23 NOTE — Interval H&P Note (Signed)
History and Physical Interval Note:  09/23/2020 8:09 AM  Seth Carter  has presented today for surgery, with the diagnosis of BENIGN PROSTATIC HYPERPLASIA.  The various methods of treatment have been discussed with the patient and family. After consideration of risks, benefits and other options for treatment, the patient has consented to  Procedure(s) with comments: [place] (TURP) (N/A) - 75 MINS as a surgical intervention.  The patient's history has been reviewed, patient examined, no change in status, stable for surgery.  I have reviewed the patient's chart and labs.  Questions were answered to the patient's satisfaction.     [name]

## 2020-09-24 DIAGNOSIS — I10 Essential (primary) hypertension: Secondary | ICD-10-CM | POA: Diagnosis not present

## 2020-09-24 DIAGNOSIS — N320 Bladder-neck obstruction: Secondary | ICD-10-CM | POA: Diagnosis not present

## 2020-09-24 DIAGNOSIS — N138 Other obstructive and reflux uropathy: Secondary | ICD-10-CM | POA: Diagnosis not present

## 2020-09-24 DIAGNOSIS — Z79899 Other long term (current) drug therapy: Secondary | ICD-10-CM | POA: Diagnosis not present

## 2020-09-24 DIAGNOSIS — G20 Parkinson's disease: Secondary | ICD-10-CM | POA: Diagnosis not present

## 2020-09-24 DIAGNOSIS — N401 Enlarged prostate with lower urinary tract symptoms: Secondary | ICD-10-CM | POA: Diagnosis not present

## 2020-09-24 LAB — SURGICAL PATHOLOGY

## 2020-09-24 MED ORDER — ACETAMINOPHEN 325 MG PO TABS
ORAL_TABLET | ORAL | Status: AC
  Filled 2020-09-24: qty 2

## 2020-09-24 NOTE — Discharge Instructions (Signed)
Transurethral Resection of the Prostate  Care After  Refer to this sheet in the next few weeks. These discharge instructions provide you with general information on caring for yourself after you leave the hospital. Your caregiver may also give you specific instructions. Your treatment has been planned according to the most current medical practices available, but unavoidable complications sometimes occur. If you have any problems or questions after discharge, please call your caregiver.  HOME CARE INSTRUCTIONS   Medications  You may receive medicine for pain management. As your level of discomfort decreases, adjustments in your pain medicines may be made.   Take all medicines as directed.   You may be given a medicine (antibiotic) to kill germs following surgery. Finish all medicines. Let your caregiver know if you have any side effects or problems from the medicine.   it would be best not to restart the aspirin until the blood in the urine clears Hygiene  You can take a shower after surgery.   You should not take a bath while you still have the urethral catheter. Activity  You will be encouraged to get out of bed as much as possible and increase your activity level as tolerated.   Spend the first week in and around your home. For 3 weeks, avoid the following:   Straining.   Running.   Strenuous work.   Walks longer than a few blocks.   Riding for extended periods.   Sexual relations.   Do not lift heavy objects (more than 20 pounds) for at least 1 month. When lifting, use your arms instead of your abdominal muscles.   You will be encouraged to walk as tolerated. Do not exert yourself. Increase your activity level slowly. Remember that it is important to keep moving after an operation of any type. This cuts down on the possibility of developing blood clots.   Your caregiver will tell you when you can resume driving and light housework. Discuss this at your first office  visit after discharge. Diet  No special diet is ordered after a TURP. However, if you are on a special diet for another medical problem, it should be continued.   Normal fluid intake is usually recommended.   Avoid alcohol and caffeinated drinks for 2 weeks. They irritate the bladder. Decaffeinated drinks are okay.   Avoid spicy foods.  Bladder Function  For the first 10 days, empty the bladder whenever you feel a definite desire. Do not try to hold the urine for long periods of time.   Urinating once or twice a night even after you are healed is not uncommon.   You may see some recurrence of blood in the urine after discharge from the hospital. This usually happens within 2 weeks after the procedure.If this occurs, force fluids again as you did in the hospital and reduce your activity.  Bowel Function  You may experience some constipation after surgery. This can be minimized by increasing fluids and fiber in your diet. Drink enough water and fluids to keep your urine clear or pale yellow.   A stool softener may be prescribed for use at home. Do not strain to move your bowels.   If you are requiring increased pain medicine, it is important that you take stool softeners to prevent constipation. This will help to promote proper healing by reducing the need to strain to move your bowels.  Sexual Activity  Semen movement in the opposite direction and into the bladder (retrograde ejaculation) may occur. Since  the semen passes into the bladder, cloudy urine can occur the first time you urinate after intercourse. Or, you may not have an ejaculation during erection. Ask your caregiver when you can resume sexual activity. Retrograde ejaculation and reduced semen discharge should not reduce one's pleasure of intercourse.  Postoperative Visit  Arrange the date and time of your after surgery visit with your caregiver.  Return to Work  After your recovery is complete, you will be able to return to  work and resume all activities. Your caregiver will inform you when you can return to work.  Foley Catheter Care A soft, flexible tube (Foley catheter) may have been placed in your bladder to drain urine and fluid. Follow these instructions: Taking Care of the Catheter  Keep the area where the catheter leaves your body clean.   Attach the catheter to the leg so there is no tension on the catheter.   Keep the drainage bag below the level of the bladder, but keep it OFF the floor.   Do not take long soaking baths. Your caregiver will give instructions about showering.   Wash your hands before touching ANYTHING related to the catheter or bag.   Using mild soap and warm water on a washcloth:   Clean the area closest to the catheter insertion site using a circular motion around the catheter.   Clean the catheter itself by wiping AWAY from the insertion site for several inches down the tube.   NEVER wipe upward as this could sweep bacteria up into the urethra (tube in your body that normally drains the bladder) and cause infection.   Place a small amount of sterile lubricant at the tip of the penis where the catheter is entering.  Taking Care of the Drainage Bags  Two drainage bags may be taken home: a large overnight drainage bag, and a smaller leg bag which fits underneath clothing.   It is okay to wear the overnight bag at any time, but NEVER wear the smaller leg bag at night.   Keep the drainage bag well below the level of your bladder. This prevents backflow of urine into the bladder and allows the urine to drain freely.   Anchor the tubing to your leg to prevent pulling or tension on the catheter. Use tape or a leg strap provided by the hospital.   Empty the drainage bag when it is 1/2 to 3/4 full. Wash your hands before and after touching the bag.   Periodically check the tubing for kinks to make sure there is no pressure on the tubing which could restrict the flow of urine.   Changing the Drainage Bags  Cleanse both ends of the clean bag with alcohol before changing.   Pinch off the rubber catheter to avoid urine spillage during the disconnection.   Disconnect the dirty bag and connect the clean one.   Empty the dirty bag carefully to avoid a urine spill.   Attach the new bag to the leg with tape or a leg strap.  Cleaning the Drainage Bags  Whenever a drainage bag is disconnected, it must be cleaned quickly so it is ready for the next use.   Wash the bag in warm, soapy water.   Rinse the bag thoroughly with warm water.   Soak the bag for 30 minutes in a solution of white vinegar and water (1 cup vinegar to 1 quart warm water).   Rinse with warm water.  SEEK MEDICAL CARE IF:   You  have chills or night sweats.   You are leaking around your catheter or have problems with your catheter. It is not uncommon to have sporadic leakage around your catheter as a result of bladder spasms. If the leakage stops, there is not much need for concern. If you are uncertain, call your caregiver.   You develop side effects that you think are coming from your medicines.  SEEK IMMEDIATE MEDICAL CARE IF:   You are suddenly unable to urinate. Check to see if there are any kinks in the drainage tubing that may cause this. If you cannot find any kinks, call your caregiver immediately. This is an emergency.   You develop shortness of breath or chest pains.   Bleeding persists or clots develop in your urine.   You have a fever.   You develop pain in your back or over your lower belly (abdomen).   You develop pain or swelling in your legs.   Any problems you are having get worse rather than better.  MAKE SURE YOU:   Understand these instructions.   Will watch your condition.   Will get help right away if you are not doing well or get worse.  Document Released: 07/10/2005 Document Revised: 03/22/2011 Document Reviewed: 03/03/2009 [facility] Patient Information 2012  [place].Transurethral Resection of the Prostate Care After Refer to this sheet in the next few weeks. These discharge instructions provide you with general information on caring for yourself after you leave the hospital. Your caregiver may also give you specific instructions. Your treatment has been planned according to the most current medical practices available, but unavoidable complications sometimes occur. If you have any problems or questions after discharge, please call your caregiver.

## 2020-09-27 ENCOUNTER — Encounter (HOSPITAL_BASED_OUTPATIENT_CLINIC_OR_DEPARTMENT_OTHER): Payer: Self-pay | Admitting: Urology

## 2020-09-27 ENCOUNTER — Encounter (INDEPENDENT_AMBULATORY_CARE_PROVIDER_SITE_OTHER): Payer: Medicare Other | Admitting: Ophthalmology

## 2020-09-28 ENCOUNTER — Encounter (HOSPITAL_COMMUNITY): Payer: Self-pay

## 2020-09-28 ENCOUNTER — Emergency Department (HOSPITAL_COMMUNITY): Payer: Medicare Other

## 2020-09-28 ENCOUNTER — Other Ambulatory Visit: Payer: Self-pay

## 2020-09-28 ENCOUNTER — Emergency Department (HOSPITAL_COMMUNITY)
Admission: EM | Admit: 2020-09-28 | Discharge: 2020-09-28 | Disposition: A | Discharge status: Home / Self Care | Payer: Medicare Other | Attending: Emergency Medicine | Admitting: Emergency Medicine

## 2020-09-28 DIAGNOSIS — D72829 Elevated white blood cell count, unspecified: Secondary | ICD-10-CM | POA: Insufficient documentation

## 2020-09-28 DIAGNOSIS — Z79899 Other long term (current) drug therapy: Secondary | ICD-10-CM | POA: Diagnosis not present

## 2020-09-28 DIAGNOSIS — I10 Essential (primary) hypertension: Secondary | ICD-10-CM | POA: Insufficient documentation

## 2020-09-28 DIAGNOSIS — R110 Nausea: Secondary | ICD-10-CM | POA: Diagnosis not present

## 2020-09-28 DIAGNOSIS — N201 Calculus of ureter: Secondary | ICD-10-CM | POA: Diagnosis not present

## 2020-09-28 DIAGNOSIS — G20 Parkinson's disease: Secondary | ICD-10-CM | POA: Insufficient documentation

## 2020-09-28 DIAGNOSIS — Z7982 Long term (current) use of aspirin: Secondary | ICD-10-CM | POA: Insufficient documentation

## 2020-09-28 DIAGNOSIS — N138 Other obstructive and reflux uropathy: Secondary | ICD-10-CM | POA: Diagnosis not present

## 2020-09-28 DIAGNOSIS — N400 Enlarged prostate without lower urinary tract symptoms: Secondary | ICD-10-CM | POA: Diagnosis not present

## 2020-09-28 DIAGNOSIS — R1032 Left lower quadrant pain: Secondary | ICD-10-CM | POA: Diagnosis not present

## 2020-09-28 LAB — CBC
HCT: 39.8 % (ref 39.0–52.0)
Hemoglobin: 13.8 g/dL (ref 13.0–17.0)
MCH: 32.5 pg (ref 26.0–34.0)
MCHC: 34.7 g/dL (ref 30.0–36.0)
MCV: 93.9 fL (ref 80.0–100.0)
Platelets: 175 10*3/uL (ref 150–400)
RBC: 4.24 MIL/uL (ref 4.22–5.81)
RDW: 12 % (ref 11.5–15.5)
WBC: 14 10*3/uL — ABNORMAL HIGH (ref 4.0–10.5)
nRBC: 0 % (ref 0.0–0.2)

## 2020-09-28 LAB — URINALYSIS, ROUTINE W REFLEX MICROSCOPIC
Bilirubin Urine: NEGATIVE
Glucose, UA: NEGATIVE mg/dL
Ketones, ur: 5 mg/dL — AB
Nitrite: NEGATIVE
Protein, ur: 30 mg/dL — AB
RBC / HPF: >50 RBC/hpf — ABNORMAL HIGH (ref 0–5)
Specific Gravity, Urine: 1.01 (ref 1.005–1.030)
pH: 7 (ref 5.0–8.0)

## 2020-09-28 LAB — COMPREHENSIVE METABOLIC PANEL
ALT: 20 U/L (ref 0–44)
AST: 32 U/L (ref 15–41)
Albumin: 4 g/dL (ref 3.5–5.0)
Alkaline Phosphatase: 93 U/L (ref 38–126)
Anion gap: 8 (ref 5–15)
BUN: 24 mg/dL — ABNORMAL HIGH (ref 8–23)
CO2: 25 mmol/L (ref 22–32)
Calcium: 9.3 mg/dL (ref 8.9–10.3)
Chloride: 99 mmol/L (ref 98–111)
Creatinine, Ser: 1.54 mg/dL — ABNORMAL HIGH (ref 0.61–1.24)
GFR, Estimated: 45 mL/min — ABNORMAL LOW (ref >60)
Glucose, Bld: 124 mg/dL — ABNORMAL HIGH (ref 70–99)
Potassium: 4.4 mmol/L (ref 3.5–5.1)
Sodium: 132 mmol/L — ABNORMAL LOW (ref 135–145)
Total Bilirubin: 1.4 mg/dL — ABNORMAL HIGH (ref 0.3–1.2)
Total Protein: 7.4 g/dL (ref 6.5–8.1)

## 2020-09-28 LAB — LIPASE, BLOOD: Lipase: 49 U/L (ref 11–51)

## 2020-09-28 MED ORDER — IOHEXOL 300 MG/ML  SOLN
75.0000 mL | Freq: Once | INTRAMUSCULAR | Status: AC | PRN
  Administered 2020-09-28: 75 mL via INTRAVENOUS

## 2020-09-28 MED ORDER — DOCUSATE SODIUM 100 MG PO CAPS: 100.0000 mg | ORAL_CAPSULE | Freq: Two times a day (BID) | ORAL | 0 refills | Status: DC

## 2020-09-28 MED ORDER — SODIUM CHLORIDE 0.9 % IV BOLUS
1000.0000 mL | Freq: Once | INTRAVENOUS | Status: AC
  Administered 2020-09-28: 1000 mL via INTRAVENOUS

## 2020-09-28 MED ORDER — HYDROCODONE-ACETAMINOPHEN 5-325 MG PO TABS: 1.0000 | ORAL_TABLET | Freq: Four times a day (QID) | ORAL | 0 refills | Status: DC | PRN

## 2020-09-28 NOTE — ED Triage Notes (Addendum)
Patient reports that he had a TURP on 09/23/20. patient states that he began having pain of the LLQ and nausea x 3 days and worsening today. Patient denies any issues with urinating.

## 2020-09-28 NOTE — ED Notes (Signed)
Pt family member asking when CT scan will be performed. Family member notified that it has been ordered and we are awaiting CT to get him for the scan and we do not know a specific time frame for this.

## 2020-09-28 NOTE — ED Notes (Signed)
Pt family member spoke with secretary regarding CT scan and when it will be performed. Pt and family member notified again about the policy.

## 2020-09-28 NOTE — ED Notes (Signed)
Pt to CT via stretcher

## 2020-09-28 NOTE — ED Notes (Signed)
Pt family member in hallway asking about CT scan again. Family member notified that CT is responsible for performing scans on the entire ER and the patient will be taken when it is their turn and the scan has been ordered.

## 2020-09-28 NOTE — ED Notes (Signed)
Pt daughter, Ennis Forts on phone and requesting to speak with doctor about discharge. MD aware and will call pt daughter

## 2020-09-28 NOTE — Discharge Summary (Signed)
Patient ID: BURNHAM TROST MRN: [id number] DOB/AGE: [id number] 80 y.o.  Admit date: 09/23/2020 Discharge date: 09/28/2020  Primary Care Physician:  [name], MD  Discharge Diagnoses: BPH with obstruction   Discharge Medications: Allergies as of 09/24/2020      Reactions   Cialis [tadalafil] Other (See Comments)   Severe back pain   Milk-related Compounds Diarrhea   upset stomach   Pneumovax [pneumococcal Polysaccharide Vaccine] Other (See Comments)   Arm swelling and pain   Simvastatin Other (See Comments)   nightmares   Ace Inhibitors Cough      Medication List    STOP taking these medications   aspirin EC 81 MG tablet     TAKE these medications   ALPROSTADIL IJ Inject as directed as needed (ed).   benzonatate 200 MG capsule Commonly known as: TESSALON Take 200 mg by mouth 3 (three) times daily as needed for cough.   carbidopa-levodopa 25-100 MG tablet Commonly known as: SINEMET IR Take 1 tablet by mouth 3 (three) times daily. 7am/11am/4pm   cholecalciferol 25 MCG (1000 UNIT) tablet Commonly known as: VITAMIN D3 Take 1,000 Units by mouth daily.   Co Q-10 100 MG Caps Take 100 mg by mouth daily.   dorzolamide 2 % ophthalmic solution Commonly known as: TRUSOPT Place 1 drop into both eyes 2 (two) times daily.   hydrochlorothiazide 12.5 MG capsule Commonly known as: MICROZIDE Take 12.5 mg by mouth daily.   Injection Device Misc 1 mL by Other route as needed.   latanoprost 0.005 % ophthalmic solution Commonly known as: XALATAN Place 1 drop into both eyes at bedtime.   Magnesium 250 MG Tabs Take 1 tablet by mouth as needed (leg cramps).   multivitamin with minerals Tabs tablet Take 1 tablet by mouth daily.   PreserVision/Lutein Caps Take 1 capsule by mouth daily.   rosuvastatin 5 MG tablet Commonly known as: CRESTOR Take 5 mg by mouth daily.   sildenafil 100 MG tablet Commonly known as: VIAGRA Take 100 mg by mouth daily as needed.   valsartan 80  MG tablet Commonly known as: DIOVAN Take 80 mg by mouth daily.   vitamin B-12 1000 MCG tablet Commonly known as: CYANOCOBALAMIN Take 1,000 mcg by mouth daily.     ASK your doctor about these medications   amoxicillin-clavulanate 875-125 MG tablet Commonly known as: AUGMENTIN Take 1 tablet by mouth 2 (two) times daily. Ask about: Should I take this medication?        Significant Diagnostic Studies:  No results found.  Brief H and P: For complete details please refer to admission H and P, but in brief the patient is admitted for definitive surgical management of BPH with obstruction  Hospital Course: The patient was admitted directly to the operating room where he underwent TURP.  He tolerated the procedure well.  Postoperative course was unremarkable, he voided postoperative day #1 and was discharged in stable condition. Active Problems:   BPH with obstruction/lower urinary tract symptoms   Day of Discharge BP 137/67 (BP Location: Left Arm)   Pulse 61   Temp (!) 97.5 F (36.4 C)   Resp 18   Ht 5\' 11"  (1.803 m)   Wt 86.6 kg   SpO2 98%   BMI 26.64 kg/m   No results found for this or any previous visit (from the past 24 hour(s)).  Physical Exam: General: Alert and awake oriented x3 not in any acute distress. HEENT: anicteric sclera, pupils reactive to light and accommodation CVS:  S1-S2 clear no murmur rubs or gallops Chest: clear to auscultation bilaterally, no wheezing rales or rhonchi Abdomen: soft nontender, nondistended, normal bowel sounds, no organomegaly Extremities: no cyanosis, clubbing or edema noted bilaterally Neuro: Cranial nerves II-XII intact, no focal neurological deficits  Disposition: Home  Diet: No restrictions  Activity: Gradually increase, discussed with patient   TESTS THAT NEED FOLLOW-UP  Pathology review  DISCHARGE FOLLOW-UP   Follow-up Information    Karen Kays, NP.   Specialty: Nurse Practitioner Why: 3.24.2022 @ 1  pm Contact information: [address] [place] [address] [phone]               Time spent on Discharge:  10 minutes  Signed: Lillette Boxer Maylen Waltermire 09/28/2020, 12:44 PM

## 2020-09-28 NOTE — ED Provider Notes (Signed)
[place] DEPT Provider Note   CSN: [id number] Arrival date & time: 09/28/20  1806     History Chief Complaint  Patient presents with  . Abdominal Pain  . Nausea    Seth Carter is a 80 y.o. male.  HPI 80 year old male presents with 2 days of left lower abdominal pain.  It is coming and going.  At its worst is about a 6 out of 10.  Nothing specific makes it come.  He had a TURP on 3/3 and was doing okay initially besides some mild dysuria.  However he also has not had a bowel movement for a couple days so he took magnesium citrate which made his stomach hurt.  He was able to have a couple bowel movements today which temporarily relieved some of his discomfort but now the pain has come back.  No back pain.  No vomiting but has felt nauseated. No fevers.   Past Medical History:  Diagnosis Date  . Allergic rhinitis, seasonal   . B12 deficiency   . BPH with obstruction/lower urinary tract symptoms   . Bradykinesia    neruologist--- dr tat  . Chronic back pain   . Chronically dry eyes   . DDD (degenerative disc disease), lumbar   . ED (erectile dysfunction)   . Essential hypertension    nuclear test in epic 01-16-2020 normal perfusion no ischemia, normal lvf and wall motion, nuclear ef 65%  . Family history of hemochromatosis   . Fatigue   . Generalized weakness   . GERD (gastroesophageal reflux disease)   . Glaucoma, both eyes   . Hemorrhoids   . History of chronic sinusitis   . History of kidney stones   . History of syncope    cardiology evaluation by dr Marlou Porch, note in epic 12-26-2019, work-up includes event monitor 02-04-2020( showed SR with occ PAC & PVC rare PAT)/ echo  12-26-2019 (mild LVH, ef 50-55%, moderate MR, mild to moderate AV sclerosis no stenosis, ascending aorta 17mm) and nuclear test 01-16-2020 (normal perfusion w/ normal LV and wall motion, nuclear ef 65%)  . Hypogonadism in male   . Macular degeneration, right eye   . Mixed  hyperlipidemia   . OA (osteoarthritis)   . Other spondylosis with radiculopathy, cervical region   . Parkinson's disease [facility]) 08/2020   neurologist--- dr tat--  newly dx 02/ 2022  . Sinusitis, acute 08/2020   per pt started antibiotic on 09-20-2020, has cough nasal congestion, cough, no fever  . Sting of hornets, wasps, and bees causing poisoning and toxic reactions   . Vitamin D deficiency   . Wears glasses     Patient Active Problem List   Diagnosis Date Noted  . BPH with obstruction/lower urinary tract symptoms 09/23/2020  . Pain in unspecified shoulder 12/18/2019  . 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/2019  . Unspecified hemorrhoids 12/18/2019  . Calculus of kidney 12/18/2019  . Vitamin D deficiency, unspecified 12/18/2019  . Male erectile dysfunction 12/18/2019  . Osteoarthritis of knee, unspecified 12/18/2019  . Impaired fasting glucose 12/18/2019  . Gastroesophageal reflux disease without esophagitis 12/18/2019  . Essential hypertension 12/18/2019  . Sebaceous cyst 12/18/2019  . Mixed hyperlipidemia 12/18/2019  . Fatigue 12/18/2019  . Pain in hand 12/18/2019  . Onychomycosis 12/18/2019  . Other spondylosis with radiculopathy, cervical region 12/18/2019  . BPH with urinary obstruction 12/18/2019  . Toxic effect of venom of wasps, accidental (unintentional), initial encounter 12/18/2019  . Medication management 12/18/2019  . Right flank pain  12/18/2019  . Memory loss 12/18/2019  . Family history of hemochromatosis 12/18/2019  . Pure hypercholesterolemia 12/18/2019  . Hyperglycemia 12/18/2019    Past Surgical History:  Procedure Laterality Date  . ANKLE SURGERY Right 10-04-2015  @[facility]    debridement achilles tendon & reattachment and heel excision osteophyte  . CATARACT EXTRACTION W/ INTRAOCULAR LENS  IMPLANT, BILATERAL  yrs ago  . CYSTOSCOPY WITH INSERTION OF UROLIFT  2019 approx.  Mar[facility] Kitchen SHOULDER SURGERY Right 1990's  . TONSILLECTOMY  age 71  . TRANSURETHRAL RESECTION OF  PROSTATE N/A 09/23/2020   Procedure: TRANSURETHRAL RESECTION OF THE PROSTATE (TURP);  Surgeon: [name], MD;  [facility]  Service: Urology;  Laterality: N/A;  43 MINS       Family History  Problem Relation Age of Onset  . Cancer Mother   . Heart attack Father   . Heart disease Sister   . Other Sister        Polio    Social History   Tobacco Use  . Smoking status: Never Smoker  . Smokeless tobacco: Never Used  Vaping Use  . Vaping Use: Never used  Substance Use Topics  . Alcohol use: Yes    Alcohol/week: 5.0 - 6.0 standard drinks    Types: 5 - 6 Standard drinks or equivalent per week  . Drug use: Never    Home Medications Prior to Admission medications   Medication Sig Start Date End Date Taking? Authorizing Provider  amLODipine (NORVASC) 5 MG tablet Take 5 mg by mouth 2 (two) times daily. 05/21/20  Yes [provider]  Apoaequorin (PREVAGEN) 10 MG CAPS Take 10 tablets by mouth daily.   Yes [provider]  Aspirin 81 MG CAPS Take 81 mg by mouth daily.   Yes [provider]  benzonatate (TESSALON) 200 MG capsule Take 200 mg by mouth 3 (three) times daily as needed for cough.   Yes [provider]  Besifloxacin HCl (BESIVANCE) 0.6 % SUSP Place 1 drop into both eyes 3 (three) times daily. Takes for 3 days after the shot   Yes [provider]  carbidopa-levodopa (SINEMET IR) 25-100 MG tablet Take 1 tablet by mouth 3 (three) times daily. 7am/11am/4pm 09/20/20  Yes Tat, Eustace Quail, DO  cholecalciferol (VITAMIN D3) 25 MCG (1000 UNIT) tablet Take 1,000 Units by mouth daily.   Yes [provider]  Coenzyme Q10 (CO Q-10) 100 MG CAPS Take 100 mg by mouth daily.   Yes [provider]  Cromolyn Sodium (NASAL ALLERGY NA) Place 1 spray into the nose daily as needed.   Yes [provider]  docusate sodium (COLACE) 100 MG capsule Take 1 capsule (100 mg total) by mouth every 12 (twelve)  hours. 09/28/20  Yes Sherwood Gambler, MD  dorzolamide-timolol (COSOPT) 22.3-6.8 MG/ML ophthalmic solution Place 1 drop into both eyes 2 (two) times daily.   Yes [provider]  HYDROcodone-acetaminophen (NORCO) 5-325 MG tablet Take 1 tablet by mouth every 6 (six) hours as needed for severe pain. 09/28/20  Yes Sherwood Gambler, MD  latanoprost (XALATAN) 0.005 % ophthalmic solution Place 1 drop into both eyes at bedtime.   Yes [provider]  Multiple Vitamin (MULTIVITAMIN WITH MINERALS) TABS tablet Take 1 tablet by mouth daily.   Yes [provider]  Multiple Vitamins-Minerals (PRESERVISION/LUTEIN) CAPS Take 1 capsule by mouth daily.   Yes [provider]  prednisoLONE acetate (PRED FORTE) 1 % ophthalmic suspension Place 1 drop into both eyes 4 (four)  times daily.   Yes [provider]  valsartan (DIOVAN) 80 MG tablet Take 80 mg by mouth daily.   Yes [provider]  vitamin B-12 (CYANOCOBALAMIN) 1000 MCG tablet Take 1,000 mcg by mouth daily.   Yes [provider]  Injection Device MISC 1 mL by Other route as needed. Patient not taking: No sig reported    [provider]    Allergies    Cialis [tadalafil], Milk-related compounds, Pneumovax [pneumococcal polysaccharide vaccine], Lac bovis, Simvastatin, and Ace inhibitors  Review of Systems   Review of Systems  Constitutional: Negative for fever.  Gastrointestinal: Positive for abdominal pain, constipation and nausea. Negative for vomiting.  Genitourinary: Positive for dysuria.  Musculoskeletal: Negative for back pain.  All other systems reviewed and are negative.   Physical Exam Updated Vital Signs BP (!) 174/86   Pulse (!) 25   Temp 99.2 F (37.3 C) (Oral)   Resp 17   Ht 5\' 11"  (1.803 m)   Wt 86.6 kg   SpO2 94%   BMI 26.64 kg/m   Physical Exam Vitals and nursing note reviewed.  Constitutional:      Appearance: He is well-developed and well-nourished.  HENT:      Head: Normocephalic and atraumatic.     Right Ear: External ear normal.     Left Ear: External ear normal.     Nose: Nose normal.  Eyes:     General:        Right eye: No discharge.        Left eye: No discharge.  Cardiovascular:     Rate and Rhythm: Normal rate and regular rhythm.     Heart sounds: Normal heart sounds.  Pulmonary:     Effort: Pulmonary effort is normal.     Breath sounds: Normal breath sounds.  Abdominal:     Palpations: Abdomen is soft.     Tenderness: There is no abdominal tenderness. There is left CVA tenderness (mild, hard to localize).  Musculoskeletal:        General: No edema.     Cervical back: Neck supple.  Skin:    General: Skin is warm and dry.  Neurological:     Mental Status: He is alert.  Psychiatric:        Mood and Affect: Mood is not anxious.     ED Results / Procedures / Treatments   Labs (all labs ordered are listed, but only abnormal results are displayed) Labs Reviewed  COMPREHENSIVE METABOLIC PANEL - Abnormal; Notable for the following components:      Result Value   Sodium 132 (*)    Glucose, Bld 124 (*)    BUN 24 (*)    Creatinine, Ser 1.54 (*)    Total Bilirubin 1.4 (*)    GFR, Estimated 45 (*)    All other components within normal limits  CBC - Abnormal; Notable for the following components:   WBC 14.0 (*)    All other components within normal limits  URINALYSIS, ROUTINE W REFLEX MICROSCOPIC - Abnormal; Notable for the following components:   Hgb urine dipstick LARGE (*)    Ketones, ur 5 (*)    Protein, ur 30 (*)    Leukocytes,Ua SMALL (*)    RBC / HPF >50 (*)    Bacteria, UA RARE (*)    All other components within normal limits  LIPASE, BLOOD    EKG None  Radiology CT ABDOMEN PELVIS W CONTRAST  Result Date: 09/28/2020 CLINICAL DATA:  Recent TURP procedure 09/23/2020, left lower quadrant pain, nausea for 3 days EXAM: CT ABDOMEN AND PELVIS WITH CONTRAST TECHNIQUE: Multidetector CT imaging of the abdomen and  pelvis was performed using the standard protocol following bolus administration of intravenous contrast. CONTRAST:  23mL OMNIPAQUE IOHEXOL 300 MG/ML  SOLN COMPARISON:  None. FINDINGS: Lower chest: No acute pleural or parenchymal lung disease. Hepatobiliary: No focal liver abnormality is seen. No gallstones, gallbladder wall thickening, or biliary dilatation. Pancreas: Unremarkable. No pancreatic ductal dilatation or surrounding inflammatory changes. Spleen: Normal in size without focal abnormality. Adrenals/Urinary Tract: There is moderate left-sided obstructive uropathy related to a 6 mm obstructing left UVJ calculus, reference image 75/2. The right kidney enhances normally, with no urinary tract calculi or obstruction. The adrenals and bladder are unremarkable. Stomach/Bowel: No bowel obstruction or ileus. Normal appendix right lower quadrant. No bowel wall thickening or inflammatory change. Vascular/Lymphatic: Aortic atherosclerosis. No enlarged abdominal or pelvic lymph nodes. Reproductive: Prostate is enlarged measuring 5.4 x 5.1 cm. Surgical clips are seen within the central aspect of the prostate, compatible with recent TURP procedure. Other: No free fluid or free gas.  No abdominal wall hernia. Musculoskeletal: No acute or destructive bony lesions. Reconstructed images demonstrate no additional findings. IMPRESSION: 1. Moderate left-sided obstructive uropathy related to a 6 mm obstructing left UVJ calculus. 2. Enlarged prostate, with postsurgical changes from recent trans urethral resection of the prostate. 3.  Aortic Atherosclerosis (ICD10-I70.0). Electronically Signed   By: Randa Ngo M.D.   On: 09/28/2020 21:17    Procedures Procedures   Medications Ordered in ED Medications  sodium chloride 0.9 % bolus 1,000 mL (0 mLs Intravenous Stopped 09/28/20 2049)  iohexol (OMNIPAQUE) 300 MG/ML solution 75 mL (75 mLs Intravenous Contrast Given 09/28/20 2044)    ED Course  I have reviewed the triage  vital signs and the nursing notes.  Pertinent labs & imaging results that were available during my care of the patient were reviewed by me and considered in my medical decision making (see chart for details).    MDM Rules/Calculators/A&P                          Patient's labs and urine have been reviewed. He has a leukocytosis, though no fever. Urine more c/w hematuria than infection. Mild bump in Cr and he was given fluids. CT shows distal ureteral stone. He has declined pain meds in the ED. He is no vomiting. At this point he appears stable for d/c home with pain control and urology follow up. Given recent surgery, I discussed with Dr. Sheppard Coil of urology, and she agrees with discharge/outpatient follow up. I discussed findings/return precautions with patient's daughter over the phone as well.  Final Clinical Impression(s) / ED Diagnoses Final diagnoses:  Left ureteral stone    Rx / DC Orders ED Discharge Orders         Ordered    HYDROcodone-acetaminophen (NORCO) 5-325 MG tablet  Every 6 hours PRN        09/28/20 2217    docusate sodium (COLACE) 100 MG capsule  Every 12 hours        09/28/20 2228           Sherwood Gambler, MD 09/28/20 2300

## 2020-09-28 NOTE — ED Notes (Signed)
Pt daughter called regarding CT scan and states her worry for pt not going for the CT scan yet. Daughter reassured that pt will be going for the scan, but has to wait for them to come get him. Daughter voiced concerns regarding pt BP and not taking his BP medication. MD notified and this RN is awaiting orders to be placed.

## 2020-09-28 NOTE — Discharge Instructions (Addendum)
Be sure to drink plenty of fluids. Call your urologist in the morning.   If you develop worsening, continued, or recurrent abdominal pain, uncontrolled vomiting, fever, chest or back pain, or any other new/concerning symptoms then return to the ER for evaluation.

## 2020-09-29 ENCOUNTER — Emergency Department (HOSPITAL_COMMUNITY): Payer: Medicare Other

## 2020-09-29 ENCOUNTER — Observation Stay (HOSPITAL_COMMUNITY)
Admission: EM | Admit: 2020-09-29 | Discharge: 2020-09-30 | Disposition: A | Discharge status: Home / Self Care | Payer: Medicare Other | Attending: Internal Medicine | Admitting: Internal Medicine

## 2020-09-29 ENCOUNTER — Observation Stay (HOSPITAL_COMMUNITY): Payer: Medicare Other

## 2020-09-29 ENCOUNTER — Other Ambulatory Visit: Payer: Self-pay

## 2020-09-29 ENCOUNTER — Encounter (INDEPENDENT_AMBULATORY_CARE_PROVIDER_SITE_OTHER): Payer: Medicare Other | Admitting: Ophthalmology

## 2020-09-29 DIAGNOSIS — D72829 Elevated white blood cell count, unspecified: Secondary | ICD-10-CM | POA: Diagnosis present

## 2020-09-29 DIAGNOSIS — R569 Unspecified convulsions: Secondary | ICD-10-CM

## 2020-09-29 DIAGNOSIS — I10 Essential (primary) hypertension: Secondary | ICD-10-CM | POA: Diagnosis not present

## 2020-09-29 DIAGNOSIS — I700 Atherosclerosis of aorta: Secondary | ICD-10-CM | POA: Diagnosis not present

## 2020-09-29 DIAGNOSIS — Z79899 Other long term (current) drug therapy: Secondary | ICD-10-CM | POA: Diagnosis not present

## 2020-09-29 DIAGNOSIS — R4182 Altered mental status, unspecified: Secondary | ICD-10-CM | POA: Diagnosis not present

## 2020-09-29 DIAGNOSIS — G20A1 Parkinson's disease without dyskinesia, without mention of fluctuations: Secondary | ICD-10-CM | POA: Diagnosis present

## 2020-09-29 DIAGNOSIS — T887XXA Unspecified adverse effect of drug or medicament, initial encounter: Secondary | ICD-10-CM | POA: Diagnosis not present

## 2020-09-29 DIAGNOSIS — Z7982 Long term (current) use of aspirin: Secondary | ICD-10-CM | POA: Insufficient documentation

## 2020-09-29 DIAGNOSIS — Z20822 Contact with and (suspected) exposure to covid-19: Secondary | ICD-10-CM | POA: Insufficient documentation

## 2020-09-29 DIAGNOSIS — G319 Degenerative disease of nervous system, unspecified: Secondary | ICD-10-CM | POA: Diagnosis not present

## 2020-09-29 DIAGNOSIS — N200 Calculus of kidney: Secondary | ICD-10-CM | POA: Diagnosis not present

## 2020-09-29 DIAGNOSIS — R404 Transient alteration of awareness: Secondary | ICD-10-CM | POA: Diagnosis not present

## 2020-09-29 DIAGNOSIS — H748X2 Other specified disorders of left middle ear and mastoid: Secondary | ICD-10-CM | POA: Diagnosis not present

## 2020-09-29 DIAGNOSIS — G20 Parkinson's disease: Secondary | ICD-10-CM | POA: Diagnosis present

## 2020-09-29 DIAGNOSIS — N179 Acute kidney failure, unspecified: Secondary | ICD-10-CM | POA: Diagnosis not present

## 2020-09-29 DIAGNOSIS — T50905A Adverse effect of unspecified drugs, medicaments and biological substances, initial encounter: Secondary | ICD-10-CM

## 2020-09-29 DIAGNOSIS — G40901 Epilepsy, unspecified, not intractable, with status epilepticus: Secondary | ICD-10-CM | POA: Diagnosis not present

## 2020-09-29 DIAGNOSIS — R21 Rash and other nonspecific skin eruption: Secondary | ICD-10-CM | POA: Diagnosis not present

## 2020-09-29 DIAGNOSIS — T508X5A Adverse effect of diagnostic agents, initial encounter: Secondary | ICD-10-CM | POA: Insufficient documentation

## 2020-09-29 DIAGNOSIS — R0902 Hypoxemia: Secondary | ICD-10-CM | POA: Diagnosis not present

## 2020-09-29 DIAGNOSIS — R251 Tremor, unspecified: Secondary | ICD-10-CM | POA: Diagnosis not present

## 2020-09-29 DIAGNOSIS — R410 Disorientation, unspecified: Secondary | ICD-10-CM | POA: Diagnosis not present

## 2020-09-29 DIAGNOSIS — T7840XA Allergy, unspecified, initial encounter: Secondary | ICD-10-CM | POA: Diagnosis not present

## 2020-09-29 HISTORY — DX: Elevated white blood cell count, unspecified: D72.829

## 2020-09-29 HISTORY — DX: Acute kidney failure, unspecified: N17.9

## 2020-09-29 HISTORY — DX: Unspecified convulsions: R56.9

## 2020-09-29 LAB — URINALYSIS, ROUTINE W REFLEX MICROSCOPIC
Bilirubin Urine: NEGATIVE
Glucose, UA: NEGATIVE mg/dL
Ketones, ur: 5 mg/dL — AB
Nitrite: NEGATIVE
Protein, ur: 100 mg/dL — AB
RBC / HPF: >50 RBC/hpf — ABNORMAL HIGH (ref 0–5)
Specific Gravity, Urine: 1.015 (ref 1.005–1.030)
pH: 5 (ref 5.0–8.0)

## 2020-09-29 LAB — CBC WITH DIFFERENTIAL/PLATELET
Basophils Absolute: 0.1 10*3/uL (ref 0.0–0.1)
Basophils Relative: 1 %
Eosinophils Absolute: 0.1 10*3/uL (ref 0.0–0.5)
Eosinophils Relative: 1 %
HCT: 42.1 % (ref 39.0–52.0)
Hemoglobin: 14.2 g/dL (ref 13.0–17.0)
Lymphocytes Relative: 9 %
Lymphs Abs: 1 10*3/uL (ref 0.7–4.0)
MCH: 32.1 pg (ref 26.0–34.0)
MCHC: 33.7 g/dL (ref 30.0–36.0)
MCV: 95 fL (ref 80.0–100.0)
Monocytes Absolute: 0.9 10*3/uL (ref 0.1–1.0)
Monocytes Relative: 7 %
Neutro Abs: 9.9 10*3/uL — ABNORMAL HIGH (ref 1.7–7.7)
Neutrophils Relative %: 82 %
Platelets: 189 10*3/uL (ref 150–400)
RBC: 4.43 MIL/uL (ref 4.22–5.81)
RDW: 12.2 % (ref 11.5–15.5)
WBC: 12 10*3/uL — ABNORMAL HIGH (ref 4.0–10.5)
nRBC: 0 % (ref 0.0–0.2)

## 2020-09-29 LAB — COMPREHENSIVE METABOLIC PANEL
ALT: 20 U/L (ref 0–44)
AST: 23 U/L (ref 15–41)
Albumin: 3.4 g/dL — ABNORMAL LOW (ref 3.5–5.0)
Alkaline Phosphatase: 85 U/L (ref 38–126)
Anion gap: 11 (ref 5–15)
BUN: 21 mg/dL (ref 8–23)
CO2: 23 mmol/L (ref 22–32)
Calcium: 9.5 mg/dL (ref 8.9–10.3)
Chloride: 103 mmol/L (ref 98–111)
Creatinine, Ser: 1.59 mg/dL — ABNORMAL HIGH (ref 0.61–1.24)
GFR, Estimated: 43 mL/min — ABNORMAL LOW (ref >60)
Glucose, Bld: 131 mg/dL — ABNORMAL HIGH (ref 70–99)
Potassium: 4.5 mmol/L (ref 3.5–5.1)
Sodium: 137 mmol/L (ref 135–145)
Total Bilirubin: 1.5 mg/dL — ABNORMAL HIGH (ref 0.3–1.2)
Total Protein: 6.7 g/dL (ref 6.5–8.1)

## 2020-09-29 LAB — LACTIC ACID, PLASMA: Lactic Acid, Venous: 1.4 mmol/L (ref 0.5–1.9)

## 2020-09-29 LAB — TSH: TSH: 1.096 u[IU]/mL (ref 0.350–4.500)

## 2020-09-29 LAB — CBG MONITORING, ED: Glucose-Capillary: 134 mg/dL — ABNORMAL HIGH (ref 70–99)

## 2020-09-29 LAB — SARS CORONAVIRUS 2 (TAT 6-24 HRS): SARS Coronavirus 2: NEGATIVE

## 2020-09-29 MED ORDER — PREDNISONE 20 MG PO TABS
60.0000 mg | ORAL_TABLET | Freq: Once | ORAL | Status: AC
  Administered 2020-09-29: 60 mg via ORAL
  Filled 2020-09-29: qty 3

## 2020-09-29 MED ORDER — LATANOPROST 0.005 % OP SOLN
1.0000 [drp] | Freq: Every day | OPHTHALMIC | Status: DC
  Administered 2020-09-30: 1 [drp] via OPHTHALMIC
  Filled 2020-09-29: qty 2.5

## 2020-09-29 MED ORDER — ACETAMINOPHEN 650 MG RE SUPP: 650.0000 mg | Freq: Four times a day (QID) | RECTAL | Status: DC | PRN

## 2020-09-29 MED ORDER — ALBUTEROL SULFATE (2.5 MG/3ML) 0.083% IN NEBU: 2.5000 mg | INHALATION_SOLUTION | Freq: Four times a day (QID) | RESPIRATORY_TRACT | Status: DC | PRN

## 2020-09-29 MED ORDER — ONDANSETRON HCL 4 MG PO TABS: 4.0000 mg | ORAL_TABLET | Freq: Four times a day (QID) | ORAL | Status: DC | PRN

## 2020-09-29 MED ORDER — SODIUM CHLORIDE 0.9 % IV SOLN
1.0000 g | INTRAVENOUS | Status: DC
  Administered 2020-09-29: 1 g via INTRAVENOUS
  Filled 2020-09-29: qty 10

## 2020-09-29 MED ORDER — SODIUM CHLORIDE 0.9 % IV SOLN: INTRAVENOUS | Status: DC

## 2020-09-29 MED ORDER — FAMOTIDINE IN NACL 20-0.9 MG/50ML-% IV SOLN
20.0000 mg | Freq: Once | INTRAVENOUS | Status: AC
  Administered 2020-09-29: 20 mg via INTRAVENOUS
  Filled 2020-09-29: qty 50

## 2020-09-29 MED ORDER — HYDROCODONE-ACETAMINOPHEN 5-325 MG PO TABS
1.0000 | ORAL_TABLET | Freq: Four times a day (QID) | ORAL | Status: DC | PRN
Start: 2020-09-29 — End: 2020-09-30

## 2020-09-29 MED ORDER — ASPIRIN EC 81 MG PO TBEC
162.0000 mg | DELAYED_RELEASE_TABLET | Freq: Every day | ORAL | Status: DC
  Administered 2020-09-29: 162 mg via ORAL
  Filled 2020-09-29: qty 2

## 2020-09-29 MED ORDER — ENOXAPARIN SODIUM 40 MG/0.4ML ~~LOC~~ SOLN
40.0000 mg | SUBCUTANEOUS | Status: DC
  Administered 2020-09-29: 40 mg via SUBCUTANEOUS
  Filled 2020-09-29: qty 0.4

## 2020-09-29 MED ORDER — DORZOLAMIDE HCL-TIMOLOL MAL 2-0.5 % OP SOLN
1.0000 [drp] | Freq: Two times a day (BID) | OPHTHALMIC | Status: DC
  Administered 2020-09-30 (×2): 1 [drp] via OPHTHALMIC
  Filled 2020-09-29: qty 10

## 2020-09-29 MED ORDER — FAMOTIDINE 20 MG PO TABS
20.0000 mg | ORAL_TABLET | Freq: Every day | ORAL | Status: DC
  Administered 2020-09-29 – 2020-09-30 (×2): 20 mg via ORAL
  Filled 2020-09-29 (×2): qty 1

## 2020-09-29 MED ORDER — DOCUSATE SODIUM 100 MG PO CAPS: 100.0000 mg | ORAL_CAPSULE | Freq: Two times a day (BID) | ORAL | Status: DC | PRN

## 2020-09-29 MED ORDER — PREDNISONE 20 MG PO TABS
40.0000 mg | ORAL_TABLET | Freq: Every day | ORAL | Status: DC
  Administered 2020-09-30: 40 mg via ORAL
  Filled 2020-09-29: qty 2

## 2020-09-29 MED ORDER — PREDNISOLONE ACETATE 1 % OP SUSP
1.0000 [drp] | Freq: Four times a day (QID) | OPHTHALMIC | Status: DC
  Administered 2020-09-30 (×3): 1 [drp] via OPHTHALMIC
  Filled 2020-09-29: qty 5

## 2020-09-29 MED ORDER — ONDANSETRON HCL 4 MG/2ML IJ SOLN: 4.0000 mg | Freq: Four times a day (QID) | INTRAMUSCULAR | Status: DC | PRN

## 2020-09-29 MED ORDER — TRIAMCINOLONE ACETONIDE 55 MCG/ACT NA AERO
2.0000 | INHALATION_SPRAY | Freq: Every day | NASAL | Status: DC | PRN
  Filled 2020-09-29: qty 21.6

## 2020-09-29 MED ORDER — SODIUM CHLORIDE 0.9 % IV SOLN: Freq: Once | INTRAVENOUS | Status: AC

## 2020-09-29 MED ORDER — DIPHENHYDRAMINE HCL 25 MG PO CAPS
25.0000 mg | ORAL_CAPSULE | Freq: Once | ORAL | Status: AC
  Administered 2020-09-29: 25 mg via ORAL
  Filled 2020-09-29: qty 1

## 2020-09-29 MED ORDER — DIPHENHYDRAMINE HCL 50 MG/ML IJ SOLN
25.0000 mg | Freq: Four times a day (QID) | INTRAMUSCULAR | Status: DC | PRN
  Administered 2020-09-29: 25 mg via INTRAVENOUS
  Filled 2020-09-29: qty 1

## 2020-09-29 MED ORDER — ACETAMINOPHEN 325 MG PO TABS: 650.0000 mg | ORAL_TABLET | Freq: Four times a day (QID) | ORAL | Status: DC | PRN

## 2020-09-29 MED ORDER — GADOBUTROL 1 MMOL/ML IV SOLN
8.5000 mL | Freq: Once | INTRAVENOUS | Status: AC | PRN
  Administered 2020-09-29: 8.5 mL via INTRAVENOUS

## 2020-09-29 NOTE — Progress Notes (Signed)
Called RN Ubaldo Glassing) to see if patient is ready for EEG, asked to call back in 15 mins.

## 2020-09-29 NOTE — ED Triage Notes (Signed)
BIB [facility] from home. Pt was seen at [facility] yesterday and was given IV contrast at there. Today pt has noticed that there was a rash all over his body. When EMS arrived the pt stood up and his whole body stiffened up and eyes rolled back. Pt was like this for 45sec-27min. Received Iv benadryl and IM epi  Prostate surgery on Thursday.

## 2020-09-29 NOTE — Care Plan (Signed)
Curbsided by  Tedd Sias, PA  80 year old man with Parkinson's disease recently started on Sinemet presenting with rash as well as an episode of shaking at home.  From the history that the shaking is available given patient and wife both have some memory impairments per PA.  He was reportedly seated, had some shaking during which time he states she did and was confused afterwards.  He did have urine in his diaper but has had a urinary incontinence since his TURP was completed on 3/3.  Rash improved with epinephrine and is now returning.  Discussed that rash could be secondary to Sinemet exposure given patient has been on this medication only for a brief time would recommend discontinuing for now and reaching out to his outpatient neurologist for close follow-up.  ED is planning to admit for observation given the rash appears to be returning at this time.  Regarding the shaking episode, given this was a single spell with no focality, recommend MRI brain with without contrast and EEG routine.  Please place formal consult to neurology if these studies demonstrate concerning findings.  Lesleigh Noe MD-PhD [facility] [phone]

## 2020-09-29 NOTE — Progress Notes (Signed)
EEG complete - results pending 

## 2020-09-29 NOTE — H&P (Signed)
History and Physical    Seth Carter WGN:[id number] DOB: 09/07/1940 DOA: 09/29/2020  Referring MD/NP/PA: [name]. PA-C PCP: [name], MD  Patient coming from: Home via EMS  Chief Complaint: Seizure  I have personally briefly reviewed patient's old medical records in [place]   HPI: Seth Carter is a 80 y.o. male with medical history significant of hypertension, hyperlipidemia, Parkinson's disease, syncope, and nephrolithiasis presents after having what was reported to be a seizure.  History is obtained from the patient, his wife, and daughter is present at bedside.  He was seen in the ER at [facility] yesterday evening with a 2-day history of left lower abdominal pain with nausea.  He just recently had a TURP on 3/3.  CT scan of the abdomen and pelvis with contrast was obtained and showed moderate left sided obstructive uropathy related to a 6 mm obstructing left UVJ calculus.  Case has been discussed with urology who recommended pain medications and discharging the patient patient was given pain medications and was discharged home.  He got home from the ER around 12 AM this morning.  When his wife also has some dementia, and him up around 10:30 AM this morning noted that he had a diffuse red rash with his whole body.  Patient reports that the rash with itching.  His wife had prepared him a normal breakfast, but the patient only ate half of it before his hands started shaking and he was staring off in a daze.  She immediately called 911.  When EMS arrived they try to get the patient up became very rigid and stiff and his eyes rolled back in his head which lasted 45 seconds to a minute.  EMS had given the patient IM epinephrine and Benadryl.  Family reports that the patient has been more slowed and delayed in his speech than he normally had been in the past and wonder what is causing the symptoms.  Patient had also recently been started on Sinemet by his neurologist Dr. [name]  for treatment of his Parkinson's.  Patient had taken only a few doses of the medication so far.    ED Course: Upon admission to the emergency department patient was seen to be afebrile with pulse 50-81, blood pressure 113/60 2-1 60/79, and O2 saturations maintained on room air.  Labs significant for WBC 12, BUN 21, creatinine 1.59, total bilirubin 1.5, and lactic acid 1.4.  CT scan of the head without contrast showed no acute abnormality.  Chest x-ray was clear.  Urinalysis was positive for large hemoglobin, moderate leukocytes, rare bacteria,> 50 RBCs, and 11-20 WBCs.  Case was discussed with neurology who recommended discontinuing Sinemet, checking MRI of the brain without contrast, EEG, and to formally consult if studies were abnormal.  The rash reappeared while he was in the emergency department.  Patient was given prednisone 60 mg, Pepcid 20 mg IV,  Benadryl 25 mg p.o., and 1 L of normal saline IV fluids.  Rash was noted to improve thereafter.  Review of Systems  Constitutional: Negative for fever.  Eyes: Negative for photophobia.  Cardiovascular: Negative for chest pain and leg swelling.  Gastrointestinal: Positive for abdominal pain.  Genitourinary: Positive for dysuria and hematuria.  Skin: Positive for itching and rash.  Neurological: Positive for loss of consciousness.    Past Medical History:  Diagnosis Date  . Allergic rhinitis, seasonal   . B12 deficiency   . BPH with obstruction/lower urinary tract symptoms   .  Bradykinesia    neruologist--- dr tat  . Chronic back pain   . Chronically dry eyes   . DDD (degenerative disc disease), lumbar   . ED (erectile dysfunction)   . Essential hypertension    nuclear test in epic 01-16-2020 normal perfusion no ischemia, normal lvf and wall motion, nuclear ef 65%  . Family history of hemochromatosis   . Fatigue   . Generalized weakness   . GERD (gastroesophageal reflux disease)   . Glaucoma, both eyes   . Hemorrhoids   . History of  chronic sinusitis   . History of kidney stones   . History of syncope    cardiology evaluation by dr Marlou Porch, note in epic 12-26-2019, work-up includes event monitor 02-04-2020( showed SR with occ PAC & PVC rare PAT)/ echo  12-26-2019 (mild LVH, ef 50-55%, moderate MR, mild to moderate AV sclerosis no stenosis, ascending aorta 34mm) and nuclear test 01-16-2020 (normal perfusion w/ normal LV and wall motion, nuclear ef 65%)  . Hypogonadism in male   . Macular degeneration, right eye   . Mixed hyperlipidemia   . OA (osteoarthritis)   . Other spondylosis with radiculopathy, cervical region   . Parkinson's disease [facility]) 08/2020   neurologist--- dr tat--  newly dx 02/ 2022  . Sinusitis, acute 08/2020   per pt started antibiotic on 09-20-2020, has cough nasal congestion, cough, no fever  . Sting of hornets, wasps, and bees causing poisoning and toxic reactions   . Vitamin D deficiency   . Wears glasses     Past Surgical History:  Procedure Laterality Date  . ANKLE SURGERY Right 10-04-2015  @[facility]    debridement achilles tendon & reattachment and heel excision osteophyte  . CATARACT EXTRACTION W/ INTRAOCULAR LENS  IMPLANT, BILATERAL  yrs ago  . CYSTOSCOPY WITH INSERTION OF UROLIFT  2019 approx.  Marland Kitchen SHOULDER SURGERY Right 1990's  . TONSILLECTOMY  age 39  . TRANSURETHRAL RESECTION OF PROSTATE N/A 09/23/2020   Procedure: TRANSURETHRAL RESECTION OF THE PROSTATE (TURP);  Surgeon: [name], MD;  Location: [facility];  Service: Urology;  Laterality: N/A;  75 MINS     reports that he has never smoked. He has never used smokeless tobacco. He reports current alcohol use of about 5.0 - 6.0 standard drinks of alcohol per week. He reports that he does not use drugs.  Allergies  Allergen Reactions  . Cialis [Tadalafil] Other (See Comments)    Severe back pain  . Milk-Related Compounds Diarrhea    upset stomach  . Pneumovax [Pneumococcal Polysaccharide Vaccine] Other (See  Comments)    Arm swelling and pain  . Lac Bovis   . Simvastatin Other (See Comments)    nightmares  . Ace Inhibitors Cough    Family History  Problem Relation Age of Onset  . Cancer Mother   . Heart attack Father   . Heart disease Sister   . Other Sister        Polio    Prior to Admission medications   Medication Sig Start Date End Date Taking? Authorizing Provider  amLODipine (NORVASC) 5 MG tablet Take 5 mg by mouth 2 (two) times daily. 05/21/20   [provider]  Apoaequorin (PREVAGEN) 10 MG CAPS Take 10 tablets by mouth daily.    [provider]  Aspirin 81 MG CAPS Take 81 mg by mouth daily.    [provider]  benzonatate (TESSALON) 200 MG capsule Take 200 mg by mouth 3 (three) times daily as needed  for cough.    [provider]  Besifloxacin HCl (BESIVANCE) 0.6 % SUSP Place 1 drop into both eyes 3 (three) times daily. Takes for 3 days after the shot    [provider]  carbidopa-levodopa (SINEMET IR) 25-100 MG tablet Take 1 tablet by mouth 3 (three) times daily. 7am/11am/4pm 09/20/20   Tat, Eustace Quail, DO  cholecalciferol (VITAMIN D3) 25 MCG (1000 UNIT) tablet Take 1,000 Units by mouth daily.    [provider]  Coenzyme Q10 (CO Q-10) 100 MG CAPS Take 100 mg by mouth daily.    [provider]  Cromolyn Sodium (NASAL ALLERGY NA) Place 1 spray into the nose daily as needed.    [provider]  docusate sodium (COLACE) 100 MG capsule Take 1 capsule (100 mg total) by mouth every 12 (twelve) hours. 09/28/20   Sherwood Gambler, MD  dorzolamide-timolol (COSOPT) 22.3-6.8 MG/ML ophthalmic solution Place 1 drop into both eyes 2 (two) times daily.    [provider]  HYDROcodone-acetaminophen (NORCO) 5-325 MG tablet Take 1 tablet by mouth every 6 (six) hours as needed for severe pain. 09/28/20   Sherwood Gambler, MD  Injection Device MISC 1 mL by Other route as needed. Patient not taking: No sig reported    [provider]  latanoprost (XALATAN) 0.005 % ophthalmic solution Place 1 drop into both eyes at bedtime.    [provider]  Multiple Vitamin (MULTIVITAMIN WITH MINERALS) TABS tablet Take 1 tablet by mouth daily.    [provider]  Multiple Vitamins-Minerals (PRESERVISION/LUTEIN) CAPS Take 1 capsule by mouth daily.    [provider]  prednisoLONE acetate (PRED FORTE) 1 % ophthalmic suspension Place 1 drop into both eyes 4 (four) times daily.    [provider]  valsartan (DIOVAN) 80 MG tablet Take 80 mg by mouth daily.    [provider]  vitamin B-12 (CYANOCOBALAMIN) 1000 MCG tablet Take 1,000 mcg by mouth daily.    [provider]    Physical Exam:  Constitutional: Elderly male currently in no acute distress Vitals:   09/29/20 1329 09/29/20 1415 09/29/20 1500 09/29/20 1515  BP: (!) 151/75 (!) 160/79 120/67 113/62  Pulse: (!) 59 64 75 81  Resp: 16 16 18 15   Temp:      TempSrc:      SpO2: 99% 99% 96% 97%  Weight:      Height:       Eyes: PERRL, lids and conjunctivae normal ENMT: Mucous membranes are moist. Posterior pharynx clear of any exudate or lesions.  Neck: normal, supple, no masses, no thyromegaly Respiratory: clear to auscultation bilaterally, no wheezing, no crackles. Normal respiratory effort. No accessory muscle use.  Cardiovascular: Regular rate and rhythm, no murmurs / rubs / gallops.  Trace lower extremity extremity edema. 2+ pedal pulses. No carotid bruits.  Abdomen: no tenderness, no masses palpated. No hepatosplenomegaly. Bowel sounds positive.  Musculoskeletal: no clubbing / cyanosis. No joint deformity upper and lower extremities. Good ROM, no contractures. Normal muscle tone.  Skin: Red rash chest and arm Neurologic: CN 2-12 grossly intact.  Patient slow to answer questions Psychiatric: Normal judgment and insight. Alert and oriented x 3.  Flat affect    Labs on Admission: I have personally reviewed  following labs and imaging studies  CBC: Recent Labs  Lab 09/23/20 0828 09/28/20 1839 09/29/20 1237  WBC  --  14.0* 12.0*  NEUTROABS  --   --  9.9*  HGB 14.3 13.8 14.2  HCT 42.0 39.8 42.1  MCV  --  93.9 95.0  PLT  --  175 735   Basic Metabolic Panel: Recent Labs  Lab 09/23/20 0828 09/28/20 1839 09/29/20 1237  NA 144 132* 137  K 3.7 4.4 4.5  CL 105 99 103  CO2  --  25 23  GLUCOSE 126* 124* 131*  BUN 25* 24* 21  CREATININE 1.00 1.54* 1.59*  CALCIUM  --  9.3 9.5   GFR: Estimated Creatinine Clearance: 38.1 mL/min (A) (by C-G formula based on SCr of 1.59 mg/dL (H)). Liver Function Tests: Recent Labs  Lab 09/28/20 1839 09/29/20 1237  AST 32 23  ALT 20 20  ALKPHOS 93 85  BILITOT 1.4* 1.5*  PROT 7.4 6.7  ALBUMIN 4.0 3.4*   Recent Labs  Lab 09/28/20 1839  LIPASE 49   No results for input(s): AMMONIA in the last 168 hours. Coagulation Profile: No results for input(s): INR, PROTIME in the last 168 hours. Cardiac Enzymes: No results for input(s): CKTOTAL, CKMB, CKMBINDEX, TROPONINI in the last 168 hours. BNP (last 3 results) No results for input(s): PROBNP in the last 8760 hours. HbA1C: No results for input(s): HGBA1C in the last 72 hours. CBG: Recent Labs  Lab 09/29/20 1158  GLUCAP 134*   Lipid Profile: No results for input(s): CHOL, HDL, LDLCALC, TRIG, CHOLHDL, LDLDIRECT in the last 72 hours. Thyroid Function Tests: Recent Labs    09/29/20 1237  TSH 1.096   Anemia Panel: No results for input(s): VITAMINB12, FOLATE, FERRITIN, TIBC, IRON, RETICCTPCT in the last 72 hours. Urine analysis:    Component Value Date/Time   COLORURINE YELLOW 09/29/2020 1236   APPEARANCEUR HAZY (A) 09/29/2020 1236   LABSPEC 1.015 09/29/2020 1236   PHURINE 5.0 09/29/2020 1236   GLUCOSEU NEGATIVE 09/29/2020 1236   HGBUR LARGE (A) 09/29/2020 1236   BILIRUBINUR NEGATIVE 09/29/2020 1236   KETONESUR 5 (A) 09/29/2020 1236   PROTEINUR 100 (A) 09/29/2020 1236   NITRITE  NEGATIVE 09/29/2020 1236   LEUKOCYTESUR MODERATE (A) 09/29/2020 1236   Sepsis Labs: Recent Results (from the past 240 hour(s))  SARS CORONAVIRUS 2 (TAT 6-24 HRS) Nasopharyngeal Nasopharyngeal Swab     Status: None   Collection Time: 09/20/20 12:36 PM   Specimen: Nasopharyngeal Swab  Result Value Ref Range Status   SARS Coronavirus 2 NEGATIVE NEGATIVE Final    Comment: (NOTE) SARS-CoV-2 target nucleic acids are NOT DETECTED.  The SARS-CoV-2 RNA is generally detectable in upper and lower respiratory specimens during the acute phase of infection. Negative results do not preclude SARS-CoV-2 infection, do not rule out co-infections with other pathogens, and should not be used as the sole basis for treatment or other patient management decisions. Negative results must be combined with clinical observations, patient history, and epidemiological information. The expected result is Negative.  Fact Sheet for Patients: SugarRoll.be  Fact Sheet for Healthcare Providers: https://www.woods-mathews.com/  This test is not yet approved or cleared by the [place] FDA and  has been authorized for detection and/or diagnosis of SARS-CoV-2 by FDA under an Emergency Use Authorization (EUA). This EUA will remain  in effect (meaning this test can be used) for the duration of the COVID-19 declaration under Se ction 564(b)(1) of the Act, 21 U.S.C. section 360bbb-3(b)(1), unless the authorization is terminated or revoked sooner.  Performed at [facility], [place] [address], [place], [place] [address]      Radiological Exams on Admission: CT HEAD WO CONTRAST  Result Date: 09/29/2020 CLINICAL DATA:  Altered mental status. EXAM: CT HEAD WITHOUT CONTRAST TECHNIQUE: Contiguous axial images were obtained from the base of the skull through the vertex without intravenous contrast. COMPARISON:  None. FINDINGS: Brain: No evidence of acute infarction, hemorrhage,  hydrocephalus, extra-axial collection or mass lesion/mass effect. Mild burden of chronic ischemic white matter disease. Vascular: No hyperdense vessel. Atherosclerotic calcifications of the internal carotid and vertebral arteries. Skull: Normal. Negative for fracture or focal lesion. Sinuses/Orbits: Polypoid mucosal thickening of the left maxillary sinus with scattered mucosal thickening of the ethmoid air cells. Other: None IMPRESSION: 1. No acute intracranial pathology. 2. Mild burden of chronic ischemic white matter disease. Electronically Signed   By: Dahlia Bailiff MD   On: 09/29/2020 13:27   CT ABDOMEN PELVIS W CONTRAST  Result Date: 09/28/2020 CLINICAL DATA:  Recent TURP procedure 09/23/2020, left lower quadrant pain, nausea for 3 days EXAM: CT ABDOMEN AND PELVIS WITH CONTRAST TECHNIQUE: Multidetector CT imaging of the abdomen and pelvis was performed using the standard protocol following bolus administration of intravenous contrast. CONTRAST:  58mL OMNIPAQUE IOHEXOL 300 MG/ML  SOLN COMPARISON:  None. FINDINGS: Lower chest: No acute pleural or parenchymal lung disease. Hepatobiliary: No focal liver abnormality is seen. No gallstones, gallbladder wall thickening, or biliary dilatation. Pancreas: Unremarkable. No pancreatic ductal dilatation or surrounding inflammatory changes. Spleen: Normal in size without focal abnormality. Adrenals/Urinary Tract: There is moderate left-sided obstructive uropathy related to a 6 mm obstructing left UVJ calculus, reference image 75/2. The right kidney enhances normally, with no urinary tract calculi or obstruction. The adrenals and bladder are unremarkable. Stomach/Bowel: No bowel obstruction or ileus. Normal appendix right lower quadrant. No bowel wall thickening or inflammatory change. Vascular/Lymphatic: Aortic atherosclerosis. No enlarged abdominal or pelvic lymph nodes. Reproductive: Prostate is enlarged measuring 5.4 x 5.1 cm. Surgical clips are seen within the  central aspect of the prostate, compatible with recent TURP procedure. Other: No free fluid or free gas.  No abdominal wall hernia. Musculoskeletal: No acute or destructive bony lesions. Reconstructed images demonstrate no additional findings. IMPRESSION: 1. Moderate left-sided obstructive uropathy related to a 6 mm obstructing left UVJ calculus. 2. Enlarged prostate, with postsurgical changes from recent trans urethral resection of the prostate. 3.  Aortic Atherosclerosis (ICD10-I70.0). Electronically Signed   By: Randa Ngo M.D.   On: 09/28/2020 21:17   DG Chest Port 1 View  Result Date: 09/29/2020 CLINICAL DATA:  Altered mental status, seizure versus syncope EXAM: PORTABLE CHEST 1 VIEW COMPARISON:  None. FINDINGS: The heart size and mediastinal contours are within normal limits. Aortic atherosclerosis. Both lungs are clear. The visualized skeletal structures are unremarkable. IMPRESSION: No acute cardiopulmonary disease. Electronically Signed   By: Dahlia Bailiff MD   On: 09/29/2020 13:24    EKG: Independently reviewed.  Sinus rhythm at 63 bpm with minimal ST depressions diffusely noted  Assessment/Plan Seizure like activity: Acute.  Wife worsened patient had an episode of his hand shaking while sitting in a chair and not responding to her like normal for which she called EMS.  Subsequently patient was noted to have been stood up by EMS and went rigid with eyes rolling back in his head.  Question if this was seizure activity versus convulsive syncope. -Admit to a medical telemetry bed  -Seizure precautions -Check orthostatic vital signs in a.m. -Follow-up MRI and EEG reading -Neurology formally consulted at family request  Rash: Acute.  Patient reportedly noted to have a red rash of the body upon waking up this morning at 10:30 AM.  Only recent changes included that he was started on Sinemet and had a contrasted CT scan of his abdomen pelvis the night before.  Patient had been given  epinephrine and Benadryl in route with EMS with improvement in symptoms, but they returned while in the ER.  Patient was given IV fluids, prednisone, Benadryl, and Pepcid with again improvement in symptoms. -Continue Pepcid, prednisone, and Benadryl as needed  Acute kidney injury secondary to left ureteral stone: Patient presents with creatinine 1.59 with BUN 21.  On the date of his TURP 3/3  his creatinine was 1.  CT imaging imaging of the abdomen pelvis from 3/8 revealed a 6 mm obstructing left UVJ calculus.  Case had been discussed with urology at that time who recommended pain control and follow-up in the outpatient setting with his urologist Dr. Diona Fanti. -Continue normal saline IV fluids at 75 mL/h -Strain all urine -Recheck kidney function in a.m. -Hydrocodone as needed for pain -Recommend outpatient follow-up with Dr. Diona Fanti  Abnormal urinalysis: Acute. UA positive for hematuria with moderate leukocytes, rare bacteria, greater than 50 RBCs, 11-20 WBCs.  Question possibility of underlying infection given recent TURP and down up -Check urine culture -Rocephin IV  Leukocytosis: WBC elevated at 12 but appears to be trending down.  Lactic acid was reassuring at 1.4. -Follow-up cultures  Parkinson's disease: Patient had just recently been started on Sinemet by Dr. Carles Collet. -Hold Sinemet given new rash  Essential hypertension: Home blood pressure medications include amlodipine 5 mg twice daily and valsartan 80 mg daily. -Held medications due to soft blood pressures and AKI  S/p TURP   DVT prophylaxis: Lovenox Code Status: Full Family Communication: Family updated at bedside Disposition Plan: Hopefully discharge home in 1 to 2 days Consults called: Neurology  Admission status: observation  Norval Morton MD [facility]   If 7PM-7AM, please contact night-coverage   09/29/2020, 3:36 PM

## 2020-09-29 NOTE — Consult Note (Addendum)
NEURO HOSPITALIST CONSULT NOTE   Requesting physician: Dr. Tamala Julian  Reason for Consult: Episode of LOC while standing with associated rigidity and eyes rolling back  History obtained from:  Patient, Family and Chart    HPI:                                                                                                                                          Seth Carter is an 80 y.o. male with a history of recently diagnosed Parkinson's disease and newly prescribed Sinemet (started taking his medication approximately 3 days ago), B12 deficiency, BPH, HTN, glaucoma, right macular degeneration, prior syncopal spells (evaluated by cardiology and event monitor with occasional PAC/PVC; had reassuring echocardiogram and nuclear stress test all within the past year) and hyperlipidemia recently discharged from the hospital on 3/4 after a TURP, who began having pain of the LLQ and nausea x 3 days and represented for evaluation of such on 3/8. A CT of the abdomen and pelvis with contrast was performed revealing a moderate left-sided obstructive uropathy related to a 6 mm obstructing left UVJ calculus. He was discharged home with plan to allow the calculus to pass on its own, in conjunction with pain medications and stool softener.   Today, a new itchy rash was noted to the patient's chest and he was somewhat tremulous. He came to the breakfast table and was eating and seemed to be mentating normally; however, he only ate half of his breakfast before his hands started shaking and he was staring off in a daze. Wife called 911. When EMS arrived they tried to stand the patient up, at which point his whole body became rigid and his eyes rolled back in his head; he was like this for 45 sec-13min. He was unresponsive during the spell and confused afterwards. There was urine in his diaper, but he has been incontinent due to the recent TURP. It was thought by EMS that his rash was possibly due to the IV  contrast that had been administered with the CT yesterday; the patient was administered IV Benadryl and IM epi for the rash.   In the ED the patient was seen to be afebrile with pulse 50-81, blood pressure 113/60 -160/79, and O2 saturations maintained on room air.  Labs significant for WBC 12, BUN 21, creatinine 1.59, total bilirubin 1.5, and lactic acid 1.4.  CT scan of the head without contrast showed no acute abnormality.   After arriving to the ED, Neurology was called to discuss the patient's shaking spell. It was felt that, given this was a single spell with no focality, an MRI brain with without contrast and EEG should be obtained, with formal consult to Neurology if these studies demonstrated concerning findings. It was also recommended to stop  the Sinemet due to his new rash being a potential side effect.   Family also reports that the patient has been more slowed and delayed in his speech than he normally had been in the past and wonder what is causing the symptoms.  MRI brain revealed atrophy and mild white matter changes consistent with chronic microvascular ischemia. No acute intracranial abnormality was seen.  EEG has been completed with results pending.   Past Medical History:  Diagnosis Date  . Allergic rhinitis, seasonal   . B12 deficiency   . BPH with obstruction/lower urinary tract symptoms   . Bradykinesia    neruologist--- dr tat  . Chronic back pain   . Chronically dry eyes   . DDD (degenerative disc disease), lumbar   . ED (erectile dysfunction)   . Essential hypertension    nuclear test in epic 01-16-2020 normal perfusion no ischemia, normal lvf and wall motion, nuclear ef 65%  . Family history of hemochromatosis   . Fatigue   . Generalized weakness   . GERD (gastroesophageal reflux disease)   . Glaucoma, both eyes   . Hemorrhoids   . History of chronic sinusitis   . History of kidney stones   . History of syncope    cardiology evaluation by dr Marlou Porch, note  in epic 12-26-2019, work-up includes event monitor 02-04-2020( showed SR with occ PAC & PVC rare PAT)/ echo  12-26-2019 (mild LVH, ef 50-55%, moderate MR, mild to moderate AV sclerosis no stenosis, ascending aorta 76mm) and nuclear test 01-16-2020 (normal perfusion w/ normal LV and wall motion, nuclear ef 65%)  . Hypogonadism in male   . Macular degeneration, right eye   . Mixed hyperlipidemia   . OA (osteoarthritis)   . Other spondylosis with radiculopathy, cervical region   . Parkinson's disease [facility]) 08/2020   neurologist--- dr tat--  newly dx 02/ 2022  . Sinusitis, acute 08/2020   per pt started antibiotic on 09-20-2020, has cough nasal congestion, cough, no fever  . Sting of hornets, wasps, and bees causing poisoning and toxic reactions   . Vitamin D deficiency   . Wears glasses     Past Surgical History:  Procedure Laterality Date  . ANKLE SURGERY Right 10-04-2015  @[facility]    debridement achilles tendon & reattachment and heel excision osteophyte  . CATARACT EXTRACTION W/ INTRAOCULAR LENS  IMPLANT, BILATERAL  yrs ago  . CYSTOSCOPY WITH INSERTION OF UROLIFT  2019 approx.  Marland Kitchen SHOULDER SURGERY Right 1990's  . TONSILLECTOMY  age 19  . TRANSURETHRAL RESECTION OF PROSTATE N/A 09/23/2020   Procedure: TRANSURETHRAL RESECTION OF THE PROSTATE (TURP);  Surgeon: [name], MD;  Location: [facility];  Service: Urology;  Laterality: N/A;  35 MINS    Family History  Problem Relation Age of Onset  . Cancer Mother   . Heart attack Father   . Heart disease Sister   . Other Sister        Polio              Social History:  reports that he has never smoked. He has never used smokeless tobacco. He reports current alcohol use of about 5.0 - 6.0 standard drinks of alcohol per week. He reports that he does not use drugs.  Allergies  Allergen Reactions  . Cialis [Tadalafil] Other (See Comments)    Severe back pain  . Milk-Related Compounds Diarrhea and Other (See Comments)     Upsets the stomach  . Pneumovax [Pneumococcal Polysaccharide Vaccine] Swelling  and Other (See Comments)    Arm swelling and pain  . Lac Bovis Diarrhea and Other (See Comments)    Upsets the stomach  . Simvastatin Other (See Comments)    Nightmares   . Ace Inhibitors Cough  . Contrast Media [Iodinated Diagnostic Agents] Itching, Rash and Other (See Comments)    POSSIBLE delayed reaction = all-over body rash and seizures that began after this was used  . Sinemet [Carbidopa W-Levodopa] Itching, Rash and Other (See Comments)    POSSIBLE delayed reaction = all-over body rash and seizures that began after starting this    MEDICATIONS:                                                                                                                     No current facility-administered medications on file prior to encounter.   Current Outpatient Medications on File Prior to Encounter  Medication Sig Dispense Refill  . amLODipine (NORVASC) 5 MG tablet Take 5 mg by mouth 2 (two) times daily.    Marland Kitchen Apoaequorin (PREVAGEN) 10 MG CAPS Take 10 mg by mouth daily.    Marland Kitchen aspirin EC 81 MG tablet Take 162 mg by mouth at bedtime. Swallow whole.    Marland Kitchen Besifloxacin HCl (BESIVANCE) 0.6 % SUSP Place 1 drop into the right eye See admin instructions. Instill 1 drop into the right eye three times a a day for for 3 days after the monthly shot    . carbidopa-levodopa (SINEMET IR) 25-100 MG tablet Take 1 tablet by mouth 3 (three) times daily. 7am/11am/4pm (Patient taking differently: Take 0.5-1 tablets by mouth See admin instructions. Take 0.5 tablet by mouth three times a day with meals, and increase to 1 tablet three times a day with meals on 10/02/2020) 270 tablet 1  . cholecalciferol (VITAMIN D3) 25 MCG (1000 UNIT) tablet Take 1,000 Units by mouth daily.    . Coenzyme Q10 (CO Q-10) 100 MG CAPS Take 100 mg by mouth daily.    Marland Kitchen docusate sodium (COLACE) 100 MG capsule Take 1 capsule (100 mg total) by mouth every 12  (twelve) hours. (Patient taking differently: Take 100 mg by mouth every 12 (twelve) hours as needed for mild constipation or moderate constipation.) 60 capsule 0  . dorzolamide-timolol (COSOPT) 22.3-6.8 MG/ML ophthalmic solution Place 1 drop into both eyes 2 (two) times daily.    Marland Kitchen latanoprost (XALATAN) 0.005 % ophthalmic solution Place 1 drop into both eyes at bedtime.    . Multiple Vitamin (MULTIVITAMIN WITH MINERALS) TABS tablet Take 1 tablet by mouth daily.    . prednisoLONE acetate (PRED FORTE) 1 % ophthalmic suspension Place 1 drop into both eyes 4 (four) times daily.    Marland Kitchen triamcinolone (NASACORT) 55 MCG/ACT AERO nasal inhaler Place 2 sprays into the nose daily as needed (for allergies).    . valsartan (DIOVAN) 80 MG tablet Take 80 mg by mouth daily.    . vitamin B-12 (CYANOCOBALAMIN) 1000 MCG tablet Take 1,000 mcg by mouth daily.    Marland Kitchen  Cromolyn Sodium (NASAL ALLERGY NA) Place 1 spray into the nose daily as needed.    Marland Kitchen HYDROcodone-acetaminophen (NORCO) 5-325 MG tablet Take 1 tablet by mouth every 6 (six) hours as needed for severe pain. 6 tablet 0  . Multiple Vitamins-Minerals (PRESERVISION/LUTEIN) CAPS Take 1 capsule by mouth daily.       Scheduled: . aspirin EC  162 mg Oral QHS  . dorzolamide-timolol  1 drop Both Eyes BID  . enoxaparin (LOVENOX) injection  40 mg Subcutaneous Q24H  . famotidine  20 mg Oral Daily  . latanoprost  1 drop Both Eyes QHS  . prednisoLONE acetate  1 drop Both Eyes QID  . [START ON 09/30/2020] predniSONE  40 mg Oral Q breakfast   Continuous: . sodium chloride 75 mL/hr at 09/29/20 2143  . cefTRIAXone (ROCEPHIN)  IV Stopped (09/29/20 2248)     ROS:                                                                                                                                       As per HPI. Comprehensive ROS otherwise negative.    Blood pressure (!) 152/84, pulse 62, temperature 97.8 F (36.6 C), temperature source Oral, resp. rate 19, height 5\' 11"   (1.803 m), weight 86.2 kg, SpO2 99 %.   General Examination:                                                                                                       Physical Exam  HEENT-  [place]/AT   Lungs- Respirations unlabored  Extremities- No edema  Neurological Examination Mental Status: Alert, oriented, thought content appropriate.  Speech fluent but slow and hypophonic, without evidence of aphasia. Decreased prosodic component to his speech is noted. Able to follow all commands without difficulty. Cranial Nerves: II:  Visual Fiorella Hanahan intact with no extinction to DSS. PERRL.   III,IV, VI: No ptosis. EOM are full with saccadic pursuits noted. No nystagmus.  V: Mildly decreased temp sensation to right side of face.   VII: Smile symmetric. Hypomimia is noted.  VIII: Hearing intact to voice IX,X: Hypophonic speech XI: Symmetric shoulder shrug XII: Midline tongue extension with tremor on extension noted.  Motor: Right : Upper extremity   5/5    Left:     Upper extremity   5/5  Lower extremity   5/5     Lower extremity   5/5 Bradykinetic x 4.  Cogwheel rigidity of BUE, left worse than right.  Intermittent  rest tremor to hands noted, worse on the right.  Tremor on extension of arms while held antigravity, worse on the right. Poor coordination with alternating finger tapping to thumb, each hand. Decreased amplitude of finger movements also noted  Sensory: Temp and light touch intact throughout, bilaterally Deep Tendon Reflexes: 1+ and symmetric throughout Plantars: Cortical toes bilaterally.  Cerebellar: No ataxia with FNF bilaterally, but bradykinetic.  Gait: Deferred   Lab Results: Basic Metabolic Panel: Recent Labs  Lab 09/23/20 0828 09/28/20 1839 09/29/20 1237  NA 144 132* 137  K 3.7 4.4 4.5  CL 105 99 103  CO2  --  25 23  GLUCOSE 126* 124* 131*  BUN 25* 24* 21  CREATININE 1.00 1.54* 1.59*  CALCIUM  --  9.3 9.5    CBC: Recent Labs  Lab 09/23/20 0828 09/28/20 1839  09/29/20 1237  WBC  --  14.0* 12.0*  NEUTROABS  --   --  9.9*  HGB 14.3 13.8 14.2  HCT 42.0 39.8 42.1  MCV  --  93.9 95.0  PLT  --  175 189    Cardiac Enzymes: No results for input(s): CKTOTAL, CKMB, CKMBINDEX, TROPONINI in the last 168 hours.  Lipid Panel: No results for input(s): CHOL, TRIG, HDL, CHOLHDL, VLDL, LDLCALC in the last 168 hours.  Imaging: CT HEAD WO CONTRAST  Result Date: 09/29/2020 CLINICAL DATA:  Altered mental status. EXAM: CT HEAD WITHOUT CONTRAST TECHNIQUE: Contiguous axial images were obtained from the base of the skull through the vertex without intravenous contrast. COMPARISON:  None. FINDINGS: Brain: No evidence of acute infarction, hemorrhage, hydrocephalus, extra-axial collection or mass lesion/mass effect. Mild burden of chronic ischemic white matter disease. Vascular: No hyperdense vessel. Atherosclerotic calcifications of the internal carotid and vertebral arteries. Skull: Normal. Negative for fracture or focal lesion. Sinuses/Orbits: Polypoid mucosal thickening of the left maxillary sinus with scattered mucosal thickening of the ethmoid air cells. Other: None IMPRESSION: 1. No acute intracranial pathology. 2. Mild burden of chronic ischemic white matter disease. Electronically Signed   By: Dahlia Bailiff MD   On: 09/29/2020 13:27   MR Brain W and Wo Contrast  Result Date: 09/29/2020 CLINICAL DATA:  Mental status change.  Tremors. EXAM: MRI HEAD WITHOUT AND WITH CONTRAST TECHNIQUE: Multiplanar, multiecho pulse sequences of the brain and surrounding structures were obtained without and with intravenous contrast. CONTRAST:  8.52mL GADAVIST GADOBUTROL 1 MMOL/ML IV SOLN COMPARISON:  CT head 09/30/2019 FINDINGS: Brain: Generalized atrophy, mild-to-moderate in degree. Negative for hydrocephalus. Mild white matter changes with scattered small white matter hyperintensities bilaterally. Brainstem and cerebellum normal. Negative for acute infarct, hemorrhage, mass.  Normal  enhancement. Vascular: Normal arterial flow voids. Skull and upper cervical spine: No focal skeletal abnormality. Sinuses/Orbits: Mild mucosal edema paranasal sinuses. Left mastoid effusion. Bilateral cataract extraction Other: None IMPRESSION: Atrophy and mild white matter changes consistent with chronic microvascular ischemia. No acute intracranial abnormality. Electronically Signed   By: [name] M.D.   On: 09/29/2020 17:40   CT ABDOMEN PELVIS W CONTRAST  Result Date: 09/28/2020 CLINICAL DATA:  Recent TURP procedure 09/23/2020, left lower quadrant pain, nausea for 3 days EXAM: CT ABDOMEN AND PELVIS WITH CONTRAST TECHNIQUE: Multidetector CT imaging of the abdomen and pelvis was performed using the standard protocol following bolus administration of intravenous contrast. CONTRAST:  52mL OMNIPAQUE IOHEXOL 300 MG/ML  SOLN COMPARISON:  None. FINDINGS: Lower chest: No acute pleural or parenchymal lung disease. Hepatobiliary: No focal liver abnormality is seen. No gallstones, gallbladder wall thickening, or biliary  dilatation. Pancreas: Unremarkable. No pancreatic ductal dilatation or surrounding inflammatory changes. Spleen: Normal in size without focal abnormality. Adrenals/Urinary Tract: There is moderate left-sided obstructive uropathy related to a 6 mm obstructing left UVJ calculus, reference image 75/2. The right kidney enhances normally, with no urinary tract calculi or obstruction. The adrenals and bladder are unremarkable. Stomach/Bowel: No bowel obstruction or ileus. Normal appendix right lower quadrant. No bowel wall thickening or inflammatory change. Vascular/Lymphatic: Aortic atherosclerosis. No enlarged abdominal or pelvic lymph nodes. Reproductive: Prostate is enlarged measuring 5.4 x 5.1 cm. Surgical clips are seen within the central aspect of the prostate, compatible with recent TURP procedure. Other: No free fluid or free gas.  No abdominal wall hernia. Musculoskeletal: No acute or destructive  bony lesions. Reconstructed images demonstrate no additional findings. IMPRESSION: 1. Moderate left-sided obstructive uropathy related to a 6 mm obstructing left UVJ calculus. 2. Enlarged prostate, with postsurgical changes from recent trans urethral resection of the prostate. 3.  Aortic Atherosclerosis (ICD10-I70.0). Electronically Signed   By: Randa Ngo M.D.   On: 09/28/2020 21:17   DG Chest Port 1 View  Result Date: 09/29/2020 CLINICAL DATA:  Altered mental status, seizure versus syncope EXAM: PORTABLE CHEST 1 VIEW COMPARISON:  None. FINDINGS: The heart size and mediastinal contours are within normal limits. Aortic atherosclerosis. Both lungs are clear. The visualized skeletal structures are unremarkable. IMPRESSION: No acute cardiopulmonary disease. Electronically Signed   By: Dahlia Bailiff MD   On: 09/29/2020 13:24    Assessment: 80 year old male with a history of recently diagnosed Parkinson's disease and newly prescribed Sinemet (started taking his medication approximately 3 days ago), prior syncopal spells and hyperlipidemia recently discharged from the hospital on 3/4 after a TURP who presents with a spell of AMS and tremor at home with recovery followed by a brief episode of tonic motor activity of limbs, trunk and eyes with unresponsiveness after standing up with EMS.  1. History and exam findings are most consistent with underlying Parkinson's disease, likely with some cognitive impairment.  2. Initial spell of awake unresponsiveness with worsened tremor at home may have been due to a spontaneous cognitive fluctuation in the setting of a synucleinopathy (Parkinson's disease) versus idiopathic postprandial hypoglycemia. His subsequent episode of sudden stiffening with unresponsiveness after standing most likely was secondary to hypotension with associated convulsive activity given his history of syncope and his Parkinsonism, which would predispose to autonomic dysfunction. Spontaneous  first-time seizure is felt to be significantly less likely.  3. EEG completed with report pending.  4. MRI brain reveals atrophy and mild white matter changes consistent with chronic microvascular ischemia. No acute intracranial abnormality is noted.  5. Rash unlikely to be secondary to Sinemet. Most likely etiology would be allergic reaction to IV iodinated contrast administered yesterday.   Recommendations: 1. Restart Sinemet 2. Awaiting EEG report.  3. PT/OT 4. Most likely will not need to be started on an anticonvulsant, unless EEG reveals a focal electrographic lesion.  5. BP management per collaboration between patient's PCP and outpatient Neurologist. May benefit from compression stockings. Discussed with patient and daughter the benefit of taking orthostatic BPs at home once per week and recording for review by his Neurologist and PCP.  6. Orthostatics taken BID while admitted.  7. IVF    Electronically signed: Dr. Kerney Elbe 09/29/2020, 7:20 PM

## 2020-09-29 NOTE — ED Notes (Signed)
Pt transported to MRI 

## 2020-09-29 NOTE — Progress Notes (Signed)
Patient is not in room yet from MRI, will wait 30-40 minutes to call RN to see if patient is ready

## 2020-09-29 NOTE — ED Provider Notes (Signed)
[facility] Provider Note   CSN: [id number] Arrival date & time: 09/29/20  1140     History Chief Complaint  Patient presents with  . Seizures    Seth Carter is a 80 y.o. male.  HPI Patient is an 80 year old male with a past medical history significant for recently diagnosed Parkinson's disease now on Sinemet since Friday, BPH s/p TURP last week, chronic back pain, HTN, history of syncope--evaluated by cardiology or event monitor with occasional PAC/PVC and had reassuring echocardiogram and nuclear stress test all within the past year.  Patient was also evaluated yesterday and diagnosed with ureteral stone.  He had an IV contrasted study at that time--patient has no known history of IV contrast allergy however is uncertain whether he has ever had a contrast study done before.  He states however that he has had an angiogram done of his heart.  Level 5 caveat memory issues  Patient able to state that he has no abdominal pain, chest pain or any other body pain.  He denies any symptoms presently and states that he feels well.   Per family This morning when patient woke up he had a rash to his chest.  He was also somewhat tremulous.  He came to the breakfast table and was eating and seemed to be mentating normally and breathing normally however he did experience approximately less than 1 minute episode of full body shaking that his wife-who suffers from dementia as well-described as convulsive.  She states that he was not responding to her when he was shaking.  She states that after the episode he seemed confused.  EMS was called to the scene and they administered Benadryl and epinephrine given the patient's rash and confusion.    Past Medical History:  Diagnosis Date  . Allergic rhinitis, seasonal   . B12 deficiency   . BPH with obstruction/lower urinary tract symptoms   . Bradykinesia    neruologist--- dr tat  . Chronic back pain   .  Chronically dry eyes   . DDD (degenerative disc disease), lumbar   . ED (erectile dysfunction)   . Essential hypertension    nuclear test in epic 01-16-2020 normal perfusion no ischemia, normal lvf and wall motion, nuclear ef 65%  . Family history of hemochromatosis   . Fatigue   . Generalized weakness   . GERD (gastroesophageal reflux disease)   . Glaucoma, both eyes   . Hemorrhoids   . History of chronic sinusitis   . History of kidney stones   . History of syncope    cardiology evaluation by dr Marlou Porch, note in epic 12-26-2019, work-up includes event monitor 02-04-2020( showed SR with occ PAC & PVC rare PAT)/ echo  12-26-2019 (mild LVH, ef 50-55%, moderate MR, mild to moderate AV sclerosis no stenosis, ascending aorta 93mm) and nuclear test 01-16-2020 (normal perfusion w/ normal LV and wall motion, nuclear ef 65%)  . Hypogonadism in male   . Macular degeneration, right eye   . Mixed hyperlipidemia   . OA (osteoarthritis)   . Other spondylosis with radiculopathy, cervical region   . Parkinson's disease [facility]) 08/2020   neurologist--- dr tat--  newly dx 02/ 2022  . Sinusitis, acute 08/2020   per pt started antibiotic on 09-20-2020, has cough nasal congestion, cough, no fever  . Sting of hornets, wasps, and bees causing poisoning and toxic reactions   . Vitamin D deficiency   . Wears glasses     Patient Active  Problem List   Diagnosis Date Noted  . Seizure-like activity ([place]) 09/29/2020  . BPH with obstruction/lower urinary tract symptoms 09/23/2020  . Pain in unspecified shoulder 12/18/2019  . 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/2019  . Unspecified hemorrhoids 12/18/2019  . Calculus of kidney 12/18/2019  . Vitamin D deficiency, unspecified 12/18/2019  . Male erectile dysfunction 12/18/2019  . Osteoarthritis of knee, unspecified 12/18/2019  . Impaired fasting glucose 12/18/2019  . Gastroesophageal reflux disease without esophagitis 12/18/2019  . Essential hypertension 12/18/2019  .  Sebaceous cyst 12/18/2019  . Mixed hyperlipidemia 12/18/2019  . Fatigue 12/18/2019  . Pain in hand 12/18/2019  . Onychomycosis 12/18/2019  . Other spondylosis with radiculopathy, cervical region 12/18/2019  . BPH with urinary obstruction 12/18/2019  . Toxic effect of venom of wasps, accidental (unintentional), initial encounter 12/18/2019  . Medication management 12/18/2019  . Right flank pain 12/18/2019  . Memory loss 12/18/2019  . Family history of hemochromatosis 12/18/2019  . Pure hypercholesterolemia 12/18/2019  . Hyperglycemia 12/18/2019    Past Surgical History:  Procedure Laterality Date  . ANKLE SURGERY Right 10-04-2015  @[facility]    debridement achilles tendon & reattachment and heel excision osteophyte  . CATARACT EXTRACTION W/ INTRAOCULAR LENS  IMPLANT, BILATERAL  yrs ago  . CYSTOSCOPY WITH INSERTION OF UROLIFT  2019 approx.  Marland Kitchen SHOULDER SURGERY Right 1990's  . TONSILLECTOMY  age 45  . TRANSURETHRAL RESECTION OF PROSTATE N/A 09/23/2020   Procedure: TRANSURETHRAL RESECTION OF THE PROSTATE (TURP);  Surgeon: [name], MD;  Location: [facility];  Service: Urology;  Laterality: N/A;  65 MINS       Family History  Problem Relation Age of Onset  . Cancer Mother   . Heart attack Father   . Heart disease Sister   . Other Sister        Polio    Social History   Tobacco Use  . Smoking status: Never Smoker  . Smokeless tobacco: Never Used  Vaping Use  . Vaping Use: Never used  Substance Use Topics  . Alcohol use: Yes    Alcohol/week: 5.0 - 6.0 standard drinks    Types: 5 - 6 Standard drinks or equivalent per week  . Drug use: Never    Home Medications Prior to Admission medications   Medication Sig Start Date End Date Taking? Authorizing Provider  amLODipine (NORVASC) 5 MG tablet Take 5 mg by mouth 2 (two) times daily. 05/21/20   [provider]  Apoaequorin (PREVAGEN) 10 MG CAPS Take 10 tablets by mouth daily.    [provider]  Aspirin 81 MG CAPS Take 81 mg by mouth daily.    [provider]  benzonatate (TESSALON) 200 MG capsule Take 200 mg by mouth 3 (three) times daily as needed for cough.    [provider]  Besifloxacin HCl (BESIVANCE) 0.6 % SUSP Place 1 drop into both eyes 3 (three) times daily. Takes for 3 days after the shot    [provider]  carbidopa-levodopa (SINEMET IR) 25-100 MG tablet Take 1 tablet by mouth 3 (three) times daily. 7am/11am/4pm 09/20/20   Tat, Eustace Quail, DO  cholecalciferol (VITAMIN D3) 25 MCG (1000 UNIT) tablet Take 1,000 Units by mouth daily.    [provider]  Coenzyme Q10 (CO Q-10) 100 MG CAPS Take 100 mg by mouth daily.    [provider]  Cromolyn Sodium (NASAL ALLERGY NA) Place 1 spray into the nose daily as needed.    [provider]  docusate  sodium (COLACE) 100 MG capsule Take 1 capsule (100 mg total) by mouth every 12 (twelve) hours. 09/28/20   Sherwood Gambler, MD  dorzolamide-timolol (COSOPT) 22.3-6.8 MG/ML ophthalmic solution Place 1 drop into both eyes 2 (two) times daily.    [provider]  HYDROcodone-acetaminophen (NORCO) 5-325 MG tablet Take 1 tablet by mouth every 6 (six) hours as needed for severe pain. 09/28/20   Sherwood Gambler, MD  Injection Device MISC 1 mL by Other route as needed. Patient not taking: No sig reported    [provider]  latanoprost (XALATAN) 0.005 % ophthalmic solution Place 1 drop into both eyes at bedtime.    [provider]  Multiple Vitamin (MULTIVITAMIN WITH MINERALS) TABS tablet Take 1 tablet by mouth daily.    [provider]  Multiple Vitamins-Minerals (PRESERVISION/LUTEIN) CAPS Take 1 capsule by mouth daily.    [provider]  prednisoLONE acetate (PRED FORTE) 1 % ophthalmic suspension Place 1 drop into both eyes 4 (four) times daily.    [provider]  valsartan (DIOVAN) 80 MG tablet Take 80 mg by mouth daily.     [provider]  vitamin B-12 (CYANOCOBALAMIN) 1000 MCG tablet Take 1,000 mcg by mouth daily.    [provider]    Allergies    Cialis [tadalafil], Milk-related compounds, Pneumovax [pneumococcal polysaccharide vaccine], Lac bovis, Simvastatin, and Ace inhibitors  Review of Systems   Review of Systems  Constitutional: Negative for chills and fever.  HENT: Negative for congestion.   Eyes: Negative for pain.  Respiratory: Negative for cough and shortness of breath.   Cardiovascular: Negative for chest pain and leg swelling.  Gastrointestinal: Negative for abdominal pain and vomiting.  Genitourinary: Negative for dysuria.  Musculoskeletal: Negative for myalgias.  Skin: Negative for rash.  Neurological: Positive for tremors. Negative for dizziness and headaches.       Family describes "seizure like activity"    Physical Exam Updated Vital Signs BP 113/62   Pulse 81   Temp 97.8 F (36.6 C) (Oral)   Resp 15   Ht 5\' 11"  (1.803 m)   Wt 86.2 kg   SpO2 97%   BMI 26.50 kg/m   Physical Exam Vitals and nursing note reviewed.  Constitutional:      General: He is not in acute distress.    Comments: Pleasant well-appearing 80 year old.  In no acute distress.  Sitting comfortably in bed.  Able answer questions appropriately follow commands but very slow to do so. No increased work of breathing. Speaking in full sentences.  HENT:     Head: Normocephalic and atraumatic.     Nose: Nose normal.     Mouth/Throat:     Mouth: Mucous membranes are dry.  Eyes:     General: No scleral icterus. Neck:     Comments: No C, T, L-spine tenderness to palpation.  Full range of motion Cardiovascular:     Rate and Rhythm: Normal rate and regular rhythm.     Pulses: Normal pulses.     Heart sounds: Normal heart sounds.  Pulmonary:     Effort: Pulmonary effort is normal. No respiratory distress.     Breath sounds: Normal breath sounds. No wheezing.     Comments: Lungs clear to  auscultation all fields no tachypnea or increased work of breathing Abdominal:     Palpations: Abdomen is soft.     Tenderness: There is no abdominal tenderness. There is no right CVA tenderness, left CVA tenderness, guarding or rebound.  Musculoskeletal:     Cervical back: Normal range of motion.     Right lower leg: Edema present.     Left lower leg: Edema present.  Skin:    General: Skin is warm and dry.     Capillary Refill: Capillary refill takes less than 2 seconds.     Comments: Faint erythematous rash to the left side of the chest and left arm  Neurological:     Mental Status: He is alert. Mental status is at baseline.     Comments: Alert and oriented to self, place, time and event.  Slow to answer questions.  Speech is fluent, clear without dysarthria or dysphasia.   Strength 5/5 in upper/lower extremities  Sensation intact in upper/lower extremities   Normal finger-to-nose and feet tapping.  CN I not tested  CN II grossly intact visual fields bilaterally. Did not visualize posterior eye.   CN III, IV, VI PERRLA and EOMs intact bilaterally  CN V Intact sensation to sharp and light touch to the face  CN VII facial movements symmetric  CN VIII not tested  CN IX, X no uvula deviation, symmetric rise of soft palate  CN XI 5/5 SCM and trapezius strength bilaterally  CN XII Midline tongue protrusion, symmetric L/R movements   Psychiatric:        Mood and Affect: Mood normal.        Behavior: Behavior normal.     ED Results / Procedures / Treatments   Labs (all labs ordered are listed, but only abnormal results are displayed) Labs Reviewed  URINALYSIS, ROUTINE W REFLEX MICROSCOPIC - Abnormal; Notable for the following components:      Result Value   APPearance HAZY (*)    Hgb urine dipstick LARGE (*)    Ketones, ur 5 (*)    Protein, ur 100 (*)    Leukocytes,Ua MODERATE (*)    RBC / HPF >50 (*)    Bacteria, UA RARE (*)    All other components within normal limits   CBC WITH DIFFERENTIAL/PLATELET - Abnormal; Notable for the following components:   WBC 12.0 (*)    Neutro Abs 9.9 (*)    All other components within normal limits  COMPREHENSIVE METABOLIC PANEL - Abnormal; Notable for the following components:   Glucose, Bld 131 (*)    Creatinine, Ser 1.59 (*)    Albumin 3.4 (*)    Total Bilirubin 1.5 (*)    GFR, Estimated 43 (*)    All other components within normal limits  CBG MONITORING, ED - Abnormal; Notable for the following components:   Glucose-Capillary 134 (*)    All other components within normal limits  SARS CORONAVIRUS 2 (TAT 6-24 HRS)  URINE CULTURE  LACTIC ACID, PLASMA  TSH    EKG None  Radiology CT HEAD WO CONTRAST  Result Date: 09/29/2020 CLINICAL DATA:  Altered mental status. EXAM: CT HEAD WITHOUT CONTRAST TECHNIQUE: Contiguous axial images were obtained from the base of the skull through the vertex without intravenous contrast. COMPARISON:  None. FINDINGS: Brain: No evidence of acute infarction, hemorrhage, hydrocephalus, extra-axial collection or mass lesion/mass effect. Mild burden of chronic ischemic white matter disease. Vascular: No hyperdense vessel. Atherosclerotic calcifications of the internal carotid and vertebral arteries. Skull: Normal. Negative for fracture or focal lesion. Sinuses/Orbits: Polypoid mucosal thickening of the left maxillary sinus with scattered mucosal thickening of the ethmoid air cells. Other: None IMPRESSION: 1. No acute intracranial pathology. 2. Mild burden of chronic ischemic white matter disease. Electronically  Signed   By: Dahlia Bailiff MD   On: 09/29/2020 13:27   CT ABDOMEN PELVIS W CONTRAST  Result Date: 09/28/2020 CLINICAL DATA:  Recent TURP procedure 09/23/2020, left lower quadrant pain, nausea for 3 days EXAM: CT ABDOMEN AND PELVIS WITH CONTRAST TECHNIQUE: Multidetector CT imaging of the abdomen and pelvis was performed using the standard protocol following bolus administration of intravenous  contrast. CONTRAST:  103mL OMNIPAQUE IOHEXOL 300 MG/ML  SOLN COMPARISON:  None. FINDINGS: Lower chest: No acute pleural or parenchymal lung disease. Hepatobiliary: No focal liver abnormality is seen. No gallstones, gallbladder wall thickening, or biliary dilatation. Pancreas: Unremarkable. No pancreatic ductal dilatation or surrounding inflammatory changes. Spleen: Normal in size without focal abnormality. Adrenals/Urinary Tract: There is moderate left-sided obstructive uropathy related to a 6 mm obstructing left UVJ calculus, reference image 75/2. The right kidney enhances normally, with no urinary tract calculi or obstruction. The adrenals and bladder are unremarkable. Stomach/Bowel: No bowel obstruction or ileus. Normal appendix right lower quadrant. No bowel wall thickening or inflammatory change. Vascular/Lymphatic: Aortic atherosclerosis. No enlarged abdominal or pelvic lymph nodes. Reproductive: Prostate is enlarged measuring 5.4 x 5.1 cm. Surgical clips are seen within the central aspect of the prostate, compatible with recent TURP procedure. Other: No free fluid or free gas.  No abdominal wall hernia. Musculoskeletal: No acute or destructive bony lesions. Reconstructed images demonstrate no additional findings. IMPRESSION: 1. Moderate left-sided obstructive uropathy related to a 6 mm obstructing left UVJ calculus. 2. Enlarged prostate, with postsurgical changes from recent trans urethral resection of the prostate. 3.  Aortic Atherosclerosis (ICD10-I70.0). Electronically Signed   By: Randa Ngo M.D.   On: 09/28/2020 21:17   DG Chest Port 1 View  Result Date: 09/29/2020 CLINICAL DATA:  Altered mental status, seizure versus syncope EXAM: PORTABLE CHEST 1 VIEW COMPARISON:  None. FINDINGS: The heart size and mediastinal contours are within normal limits. Aortic atherosclerosis. Both lungs are clear. The visualized skeletal structures are unremarkable. IMPRESSION: No acute cardiopulmonary disease.  Electronically Signed   By: Dahlia Bailiff MD   On: 09/29/2020 13:24    Procedures Ultrasound ED Peripheral IV (Provider)  Date/Time: 09/29/2020 1:48 PM Performed by: Tedd Sias, PA Authorized by: Tedd Sias, PA   Procedure details:    Indications: hydration and poor IV access     Skin Prep: chlorhexidine gluconate     Location:  Right AC   Angiocath:  20 G   Bedside Ultrasound Guided: Yes     Images: not archived     Patient tolerated procedure without complications: Yes     Dressing applied: Yes   Comments:     20-gauge IV placed in the right AC.  Patient tolerated procedure well. Procedure was done because EMS placed IV would not draw blood.  .Critical Care Performed by: Tedd Sias, PA Authorized by: Tedd Sias, PA   Critical care provider statement:    Critical care time (minutes):  35   Critical care time was exclusive of:  Separately billable procedures and treating other patients and teaching time   Critical care was necessary to treat or prevent imminent or life-threatening deterioration of the following conditions: anaphylaxis with rebound after epinephrine    Critical care was time spent personally by me on the following activities:  Discussions with consultants, evaluation of patient's response to treatment, examination of patient, review of old charts, re-evaluation of patient's condition, pulse oximetry, ordering and review of radiographic studies, ordering and review of laboratory studies  and ordering and performing treatments and interventions   I assumed direction of critical care for this patient from another provider in my specialty: no       Medications Ordered in ED Medications  famotidine (PEPCID) IVPB 20 mg premix (20 mg Intravenous New Bag/Given 09/29/20 1600)  0.9 %  sodium chloride infusion ( Intravenous Stopped 09/29/20 1437)  diphenhydrAMINE (BENADRYL) capsule 25 mg (25 mg Oral Given 09/29/20 1517)  predniSONE (DELTASONE) tablet 60  mg (60 mg Oral Given 09/29/20 1556)    ED Course  I have reviewed the triage vital signs and the nursing notes.  Pertinent labs & imaging results that were available during my care of the patient were reviewed by me and considered in my medical decision making (see chart for details).  Patient is an 80 year old male with past medical history detailed in HPI presented today after convulsive episode.  He also had a rash this morning which improved with epinephrine administered by EMS.  On my initial examination patient did have a faint rash to his left chest notably this seemed to worsen later during his ER visit presumably after epinephrine had worn off.  Provide patient with prednisone, Benadryl and famotidine.  He will require admission given the recurrence of what appears to be a allergic reaction.  CMP with no significant change in creatinine from visit yesterday.  CBC with mild leukocytosis which is neutrophil predominant.  Urinalysis does have rare bacteria and a large amount of hematuria consistent with recent TURP.  I discussed with patient he is not having any urinary symptoms and his urine seems to be improving in color from his procedure.  Will defer antibiotics at this time to hospitalist.  No negation to begin antibiotics at this time.  Covid test pending at this time.  Urine culture pending.  TSH within normal limits.  Clinical Course as of 09/29/20 1604  Wed Sep 29, 2020  1346 CT head   IMPRESSION: 1. No acute intracranial pathology. 2. Mild burden of chronic ischemic white matter disease. [WF]  1346 CXR without abnormality  [WF]  1540 Discussed with Dr. Tamala Julian of hospitalist service who will see pt and admit to hospital.  [WF]    Clinical Course User Index [WF] Tedd Sias, PA   MDM Rules/Calculators/A&P                          Patient admitted to the hospitalist for questionable seizure-like activity also with questionable anaphylaxis with recurrence after  epinephrine wore off.   Final Clinical Impression(s) / ED Diagnoses Final diagnoses:  Seizure-like activity ([place])  Allergic reaction, initial encounter  Adverse effect of drug, initial encounter    Rx / DC Orders ED Discharge Orders    None       Tedd Sias, [place] 09/29/20 1912    Carmin Muskrat, MD 10/01/20 [phone]

## 2020-09-29 NOTE — Progress Notes (Signed)
Pt going for STAT MRI in ~15 mins. Will check back as schedule permits.

## 2020-09-30 ENCOUNTER — Telehealth: Payer: Self-pay | Admitting: Neurology

## 2020-09-30 DIAGNOSIS — R569 Unspecified convulsions: Secondary | ICD-10-CM | POA: Diagnosis not present

## 2020-09-30 DIAGNOSIS — N179 Acute kidney failure, unspecified: Secondary | ICD-10-CM

## 2020-09-30 DIAGNOSIS — T50905A Adverse effect of unspecified drugs, medicaments and biological substances, initial encounter: Secondary | ICD-10-CM | POA: Diagnosis not present

## 2020-09-30 LAB — CBC
HCT: 40.3 % (ref 39.0–52.0)
Hemoglobin: 13.9 g/dL (ref 13.0–17.0)
MCH: 32.2 pg (ref 26.0–34.0)
MCHC: 34.5 g/dL (ref 30.0–36.0)
MCV: 93.3 fL (ref 80.0–100.0)
Platelets: 196 10*3/uL (ref 150–400)
RBC: 4.32 MIL/uL (ref 4.22–5.81)
RDW: 11.9 % (ref 11.5–15.5)
WBC: 8.3 10*3/uL (ref 4.0–10.5)
nRBC: 0 % (ref 0.0–0.2)

## 2020-09-30 LAB — BASIC METABOLIC PANEL
Anion gap: 9 (ref 5–15)
BUN: 23 mg/dL (ref 8–23)
CO2: 22 mmol/L (ref 22–32)
Calcium: 9.1 mg/dL (ref 8.9–10.3)
Chloride: 104 mmol/L (ref 98–111)
Creatinine, Ser: 1.16 mg/dL (ref 0.61–1.24)
GFR, Estimated: >60 mL/min (ref >60)
Glucose, Bld: 192 mg/dL — ABNORMAL HIGH (ref 70–99)
Potassium: 4.5 mmol/L (ref 3.5–5.1)
Sodium: 135 mmol/L (ref 135–145)

## 2020-09-30 MED ORDER — FAMOTIDINE 20 MG PO TABS: 20.0000 mg | ORAL_TABLET | Freq: Every day | ORAL | 0 refills | Status: DC

## 2020-09-30 MED ORDER — PREDNISONE 10 MG PO TABS: ORAL_TABLET | ORAL | 0 refills | Status: DC

## 2020-09-30 MED ORDER — CARBIDOPA-LEVODOPA 25-100 MG PO TABS
0.5000 | ORAL_TABLET | Freq: Three times a day (TID) | ORAL | Status: DC
  Administered 2020-09-30: 0.5 via ORAL
  Filled 2020-09-30 (×3): qty 0.5

## 2020-09-30 NOTE — Telephone Encounter (Signed)
Seth Carter, put him in Monday at 10:15.    Alyse Low, call him in the AM and do a TCM phone call and document please.  Thank you

## 2020-09-30 NOTE — Telephone Encounter (Signed)
I got patient sch for 10-04-20 at 10:15

## 2020-09-30 NOTE — Telephone Encounter (Signed)
Patient daughter called and states that patient is going to be discharged from [facility] later today and they need to see [name] about patient Parkinson.   They have several questions such as   Where he is with the parkinson?  How should they be taking care of the patient?  What to expect with the parkinson?   Also she states that his blood pressure is swinging from lying down to sitting or standing. They were to told at [facility] that a lot of times the neurologist would handle the Blood pressure medication and not the PCP. So they need to know if [name] would be handling the blood pressure medication or should the PCP be?   Please call   Patient was seen last by Tat on 09-20-20 and has appt on 03-08-21.

## 2020-09-30 NOTE — Progress Notes (Signed)
Patient was discharged per MD order. Escorted to daughter's car by RN via wheelchair. IV's d/c'd. Skin intact. Rash on chest has resolved. Patient has no complains of pain. AVS reviewed with patient and daughter at bedside. Patient does not have any prescriptions or equipment to send home.

## 2020-09-30 NOTE — Evaluation (Signed)
Physical Therapy Evaluation Patient Details Name: Seth Carter MRN: [id number] DOB: 06-30-1941 Today's Date: 09/30/2020   History of Present Illness  80 year old male presents with a spell of AMS and tremor at home with recovery followed by a brief episode of tonic motor activity of limbs, trunk and eyes with unresponsiveness after standing up with EMS. CT with no acute abormallity, MRI revealed atrophy and chronic microvascular ischemia. EEG suggestive of mild diffuse encephalopathy. Admitted 3/9 for observation and collection of orthostatic BP. PMH: diagnosed Parkinson's disease and newly prescribed Sinemet (started taking his medication approximately 3 days ago), prior syncopal spells and hyperlipidemia recently discharged from the hospital on 3/4 after a TURP Returned to ED 3/8 for LLQ pain A CT of the abdomen and pelvis revealed L UVJ calculus causing moderate L sided obstructive uropathy. discharged home with plan for calculus to pass.  Clinical Impression  PTA pt living with wife in single story home with 15 steps to enter. Pt reports complete independence, driving and working in his woodshop. Pt is limited in safe mobility by fluctuating BP with positional change and mobility with mild symptoms. Encouraged pt to note how he feels prior to ambulating away from safe seating. Pt daughter presence and reports that they are to be collecting BP changes for neurologist follow up next week. Pt's daughter is staying with him for the near future. Pt is supervision for bed mobility, transfers and ambulation, and min guard for stairs. Pt does not have any immediate PT needs although encouraged OP Neuro PT in future. Pt will continue to follow acutely, however plans are for d/c today.     Follow Up Recommendations Other (comment);Supervision for mobility/OOB (recommended outpatient neuro PT in near future once Parkinson's medications are straightened out)    Equipment Recommendations  None recommended by PT  (pt has neccessary equipmetn)       Precautions / Restrictions Precautions Precautions: Fall Restrictions Weight Bearing Restrictions: No      Mobility  Bed Mobility Overal bed mobility: Modified Independent             General bed mobility comments: HoB elevated, use of rail    Transfers Overall transfer level: Needs assistance Equipment used: None Transfers: Sit to/from Stand Sit to Stand: Supervision         General transfer comment: initially too far back in bed to raise hips fully and sat back down, scooted forward and attempted again, able to power up, uses side of bed to steady his LE before stepping out  Ambulation/Gait Ambulation/Gait assistance: Supervision Gait Distance (Feet): 400 Feet Assistive device: None Gait Pattern/deviations: Decreased step length - right;Decreased step length - left;Narrow base of support;Step-through pattern Gait velocity: slowed Gait velocity interpretation: 1.31 - 2.62 ft/sec, indicative of limited community ambulator General Gait Details: supervision for safety, decreased step length and narrow base of support, cues for increased foot clearance  Stairs Stairs: Yes Stairs assistance: Min guard Stair Management: Forwards;One rail Left Number of Stairs: 12 General stair comments: min guard for safety, decreased foot clearance initially however with cuing able to get foot on tread without hitting edge of step        Balance Overall balance assessment: Mild deficits observed, not formally tested                                           Pertinent Vitals/Pain  Pain Assessment: No/denies pain    Home Living Family/patient expects to be discharged to:: Private residence Living Arrangements: Spouse/significant other Available Help at Discharge: Family;Available 24 hours/day Type of Home: House Home Access: Stairs to enter Entrance Stairs-Rails: Can reach both Entrance Stairs-Number of Steps: 15 Home  Layout: One level Home Equipment: Shower seat;Bedside commode;Walker - 2 wheels;Grab bars - tub/shower;Hand held shower head      Prior Function Level of Independence: Independent                  Extremity/Trunk Assessment   Upper Extremity Assessment Upper Extremity Assessment: Overall WFL for tasks assessed    Lower Extremity Assessment Lower Extremity Assessment: Overall WFL for tasks assessed       Communication   Communication: No difficulties  Cognition Arousal/Alertness: Awake/alert Behavior During Therapy: WFL for tasks assessed/performed Overall Cognitive Status: Impaired/Different from baseline Area of Impairment: Memory;Following commands;Problem solving                     Memory: Decreased short-term memory Following Commands: Follows one step commands with increased time;Follows multi-step commands with increased time     Problem Solving: Difficulty sequencing;Slow processing;Requires verbal cues;Requires tactile cues General Comments: when asked his birthdate provided his wife's, difficulty with recall of instructions for activity at home,      General Comments General comments (skin integrity, edema, etc.): Daughter present in room, helpful allowing pt to answer questions, ellaborating as needed        Assessment/Plan    PT Assessment Patient needs continued PT services  PT Problem List         PT Treatment Interventions Functional mobility training;Cognitive remediation;Neuromuscular re-education;Patient/family education;Therapeutic activities;Therapeutic exercise    PT Goals (Current goals can be found in the Care Plan section)  Acute Rehab PT Goals Patient Stated Goal: go home PT Goal Formulation: With patient/family Time For Goal Achievement: 10/14/20 Potential to Achieve Goals: Good    Frequency Min 3X/week    AM-PAC PT "6 Clicks" Mobility  Outcome Measure Help needed turning from your back to your side while in a flat  bed without using bedrails?: None Help needed moving from lying on your back to sitting on the side of a flat bed without using bedrails?: None Help needed moving to and from a bed to a chair (including a wheelchair)?: None Help needed standing up from a chair using your arms (e.g., wheelchair or bedside chair)?: None Help needed to walk in hospital room?: None Help needed climbing 3-5 steps with a railing? : None 6 Click Score: 24    End of Session Equipment Utilized During Treatment: Gait belt Activity Tolerance: Patient tolerated treatment well Patient left: in chair;with call bell/phone within reach;with chair alarm set;with family/visitor present Nurse Communication: Mobility status PT Visit Diagnosis: Other abnormalities of gait and mobility (R26.89)    Time: 9379-0240 PT Time Calculation (min) (ACUTE ONLY): 46 min   Charges:   PT Evaluation $PT Eval Moderate Complexity: 1 Mod PT Treatments $Gait Training: 8-22 mins $Self Care/Home Management: 8-22        Elizabeth B. Migdalia Dk PT, DPT Acute Rehabilitation Services Pager [phone] Office [phone]   [place] 09/30/2020, 2:20 PM

## 2020-09-30 NOTE — Progress Notes (Signed)
OT Cancellation Note  Patient Details Name: Seth Carter MRN: [id number] DOB: July 30, 1940   Cancelled Treatment:    Reason Eval/Treat Not Completed: Other (comment) Per PT, pt with new Parkinson's diagnosis with daughter planning to stay with pt and assist with all tasks needed. Pt able to complete LB ADLs without physical assist with PT. Pt has a neurology appointment follow-up planned - would recommend PT/OT outpatient neuro follow-up after this appointment. No formal OT eval needed at acute level prior to DC at this time.   Layla Maw 09/30/2020, 2:00 PM

## 2020-09-30 NOTE — Progress Notes (Signed)
TRANSITION CARE MANAGEMENT NOTE  Assessment/Plan:   1.  Parkinsons Disease, recent diagnosis  -Patient had syncopal episode few days after starting levodopa.  Multiple things were going on at the same time.  He had a TURP a few days prior, had just developed a kidney stone after the procedure and had gotten iodinated contrast and developed an allergy to the contrast.  Suspect that he got somewhat hypotensive from this in addition to the new start of levodopa, causing the symptoms.  However, daughter does report that she tried to restart the carbidopa/levodopa and he got a rash again.  This would be very unusual (I've never seen that happen with this med) unless potentially he was allergic to the filler or dye in the medication.  Discussed options including trying rytary and daughter would like pt to see allergy/immunology first to see what pt allergic to.  This is reasonable and will send referral  -Have noted that patient's family have asked for second opinion at [facility].  I have no objection to that.  He has an appointment on July 11.  -PT going to be started at neurorehab center soon  -they ask about driving.  Hold until we find out what pt allergic to/how feels over next 4  weeks  -they ask about DaT scan and daughters would like to schedule.  Devaughn't want to do that until we find out if patient is allergic to filler with carbidopa/levodopa or iodine, because the DaT scan has cross reactivity with iodine allergy.  2.  Orthostatic Hypotension  -f/u PCP.  He is having BP fluctuations (BP sitting in the 160-170's SBP and can be 90's standing)  but is on 2 different BP meds.  Worried more about the lows than the highs and told them to f/u pcp to see about 2 different BP meds he is on  3.  Memory change  -he doesn't appear to have dementia to me  -Kolbey't recommend prevagen regardless  -daughters would like to have neurocog testing scheduled and we will schedule.  4.  Daughters had many questions (1  page typed) and answered those to the best of my ability today.  He will follow up with me in the next 5 months.  Has an appointment with the Ethan in July.  Subjective:   Seth Carter was seen today in hospital follow-up.  Multiple notes are reviewed.  Daughter present and supplements hx.  Other daughter supplements the hx.  Diagnosis was made last visit of Parkinson's disease.  He was started on levodopa.  However, he waited to start that until just recently, approximately 3 days prior to admission.  6 days prior to admission he underwent a TURP.  He came to the emergency room a few days after the TURP complaining about lower abdominal pain.  He was diagnosed with a left ureteral stone, given Norco and discharged from the emergency room.  2 days later he came back to the emergency room after a possible syncopal episode.  He woke up and had a rash on his chest (later felt due to the IV contrast given to look for the kidney stone) and came to eat breakfast. Pt states that he remembers feeling nauseated before the event and wife went to get a pan to have him throw up in.  Pt states that he remembers trying to stand up right before the event.   Wife noticed pt started shaking and he was staring off in a daze (wife has memory issues, however).  patient did have urine in his diaper (urinary incontinence since his TURP).  When EMS arrived, they tried to stand the patient up and he apparently became rigid and eyes rolled back.  He was confused after.  Neurology evaluated the patient and felt he likely had convulsive syncope (has had syncope in the past).  MRI brain was unremarkable.  EEG demonstrated slightly slow background of 7 Hz.  Daughter states that after d/c to the hospital she had pt take the carbidopa/levodopa 25/100 again and he got the rash again.  It wasn't full body this time but daughter gave him benadryl.  They d/c the levodopa.  Daughter states that he wasn't "himself" when he came home on Friday but  was Saturday and Sunday.  He was always oriented to person, place, time but was slow to react.  Daughters would like DaT scan.  They ask about cog testing.     ALLERGIES:   Allergies  Allergen Reactions  . Cialis [Tadalafil] Other (See Comments)    Severe back pain  . Milk-Related Compounds Diarrhea and Other (See Comments)    Upsets the stomach  . Pneumovax [Pneumococcal Polysaccharide Vaccine] Swelling and Other (See Comments)    Arm swelling and pain  . Lac Bovis Diarrhea and Other (See Comments)    Upsets the stomach  . Simvastatin Other (See Comments)    Nightmares   . Ace Inhibitors Cough  . Contrast Media [Iodinated Diagnostic Agents] Itching, Rash and Other (See Comments)    POSSIBLE delayed reaction = all-over body rash and seizures that began after this was used  . Sinemet [Carbidopa W-Levodopa] Itching, Rash and Other (See Comments)    POSSIBLE delayed reaction = all-over body rash and seizures that began after starting this    CURRENT MEDICATIONS:  Outpatient Encounter Medications as of 10/04/2020  Medication Sig  . amLODipine (NORVASC) 5 MG tablet Take 5 mg by mouth 2 (two) times daily.  Marland Kitchen Apoaequorin (PREVAGEN) 10 MG CAPS Take 10 mg by mouth daily.  Marland Kitchen aspirin EC 81 MG tablet Take 81 mg by mouth at bedtime. Swallow whole.  Marland Kitchen Besifloxacin HCl (BESIVANCE) 0.6 % SUSP Place 1 drop into the right eye See admin instructions. Instill 1 drop into the right eye three times a a day for for 3 days after the monthly shot  . cholecalciferol (VITAMIN D3) 25 MCG (1000 UNIT) tablet Take 1,000 Units by mouth daily.  . Coenzyme Q10 (CO Q-10) 100 MG CAPS Take 100 mg by mouth daily.  . Cromolyn Sodium (NASAL ALLERGY NA) Place 1 spray into the nose daily as needed.  . dorzolamide-timolol (COSOPT) 22.3-6.8 MG/ML ophthalmic solution Place 1 drop into both eyes 2 (two) times daily.  . famotidine (PEPCID) 20 MG tablet Take 1 tablet (20 mg total) by mouth daily.  Marland Kitchen latanoprost (XALATAN) 0.005 %  ophthalmic solution Place 1 drop into both eyes at bedtime.  . Multiple Vitamin (MULTIVITAMIN WITH MINERALS) TABS tablet Take 1 tablet by mouth daily.  . Multiple Vitamins-Minerals (PRESERVISION/LUTEIN) CAPS Take 1 capsule by mouth daily.  . prednisoLONE acetate (PRED FORTE) 1 % ophthalmic suspension Place 1 drop into both eyes 4 (four) times daily.  Marland Kitchen triamcinolone (NASACORT) 55 MCG/ACT AERO nasal inhaler Place 2 sprays into the nose daily as needed (for allergies).  . valsartan (DIOVAN) 80 MG tablet Take 240 mg by mouth daily.  . vitamin B-12 (CYANOCOBALAMIN) 1000 MCG tablet Take 1,000 mcg by mouth daily.  . carbidopa-levodopa (SINEMET IR) 25-100 MG tablet Take  1 tablet by mouth 3 (three) times daily. 7am/11am/4pm (Patient not taking: Reported on 10/04/2020)  . docusate sodium (COLACE) 100 MG capsule Take 1 capsule (100 mg total) by mouth every 12 (twelve) hours. (Patient not taking: Reported on 10/04/2020)  . [DISCONTINUED] HYDROcodone-acetaminophen (NORCO) 5-325 MG tablet Take 1 tablet by mouth every 6 (six) hours as needed for severe pain. (Patient not taking: Reported on 10/04/2020)  . [DISCONTINUED] predniSONE (DELTASONE) 10 MG tablet Take  30 mg daily for 1 day, 20 mg daily for 1 days,10 mg daily for 1 day, then stop (Patient not taking: Reported on 10/04/2020)  . [DISCONTINUED] 0.9 %  sodium chloride infusion   . [DISCONTINUED] acetaminophen (TYLENOL) suppository 650 mg   . [DISCONTINUED] acetaminophen (TYLENOL) tablet 650 mg   . [DISCONTINUED] albuterol (PROVENTIL) (2.5 MG/3ML) 0.083% nebulizer solution 2.5 mg   . [DISCONTINUED] aspirin EC tablet 162 mg   . [DISCONTINUED] carbidopa-levodopa (SINEMET IR) 25-100 MG per tablet immediate release 0.5 tablet   . [DISCONTINUED] cefTRIAXone (ROCEPHIN) 1 g in sodium chloride 0.9 % 100 mL IVPB   . [DISCONTINUED] diphenhydrAMINE (BENADRYL) injection 25 mg   . [DISCONTINUED] docusate sodium (COLACE) capsule 100 mg   . [DISCONTINUED] dorzolamide-timolol  (COSOPT) 22.3-6.8 MG/ML ophthalmic solution 1 drop   . [DISCONTINUED] enoxaparin (LOVENOX) injection 40 mg   . [DISCONTINUED] famotidine (PEPCID) tablet 20 mg   . [DISCONTINUED] HYDROcodone-acetaminophen (NORCO/VICODIN) 5-325 MG per tablet 1 tablet   . [DISCONTINUED] latanoprost (XALATAN) 0.005 % ophthalmic solution 1 drop   . [DISCONTINUED] ondansetron (ZOFRAN) injection 4 mg   . [DISCONTINUED] ondansetron (ZOFRAN) tablet 4 mg   . [DISCONTINUED] prednisoLONE acetate (PRED FORTE) 1 % ophthalmic suspension 1 drop   . [DISCONTINUED] predniSONE (DELTASONE) tablet 40 mg   . [DISCONTINUED] triamcinolone (NASACORT) nasal inhaler 2 spray    No facility-administered encounter medications on file as of 10/04/2020.    Objective:   PHYSICAL EXAMINATION:    VITALS:   Vitals:   10/04/20 1019  BP: 112/70  Pulse: 66  SpO2: 99%  Weight: 190 lb (86.2 kg)  Height: 5\' 11"  (1.803 m)   Wt Readings from Last 3 Encounters:  10/04/20 190 lb (86.2 kg)  09/29/20 190 lb (86.2 kg)  09/28/20 191 lb (86.6 kg)     GEN:  The patient appears stated age and is in NAD. HEENT:  Normocephalic, atraumatic.  The mucous membranes are moist.  Neurological examination:  Orientation: The patient is alert and oriented x3. Cranial nerves: There is good facial symmetry with facial hypomimia. The speech is fluent and clear. Soft palate rises symmetrically and there is no tongue deviation. Hearing is intact to conversational tone. Sensation: Sensation is intact to light touch throughout Motor: Strength is at least antigravity x4.  Movement examination: Tone: There is mild to mod increased tone in the LUE Abnormal movements: none Coordination:  There is mild decremation with RAM's, with any form of RAMS, including alternating supination and pronation of the forearm, hand opening and closing, finger taps, heel taps and toe taps on the L Gait and Station: The patient has no difficulty arising out of a deep-seated chair  without the use of the hands. The patient's stride length is decreased with decreased arm swing bilaterally.      Total time spent on today's visit was 65 minutes, including both face-to-face time and nonface-to-face time.  Time included that spent on review of records (prior notes available to me/labs/imaging if pertinent), discussing treatment and goals, answering patient's questions and  coordinating care.  Cc:  [name], MD

## 2020-09-30 NOTE — Discharge Summary (Signed)
PATIENT DETAILS Name: Seth Carter Age: 80 y.o. Sex: male Date of Birth: [birth year] MRN: [id number]. Admitting Physician: Norval Morton, MD WVP:[id number], Herbie [place], MD  Admit Date: 09/29/2020 Discharge date: 09/30/2020  Recommendations for Outpatient Follow-up:  1. Follow up with PCP in 1-2 weeks 2. Please obtain CMP/CBC in one week 3. Please ensure outpatient follow-up with urology, neurology 4. Blood cultures pending at the time of discharge-please follow  Admitted From:  Home   Disposition: [place]: No  Equipment/Devices: None  Discharge Condition: Stable  CODE STATUS: FULL CODE  Diet recommendation:  Diet Order            Diet - low sodium heart healthy           Diet Heart Room service appropriate? Yes; Fluid consistency: Thin  Diet effective now                  Brief Summary: See H&P, Labs, Consult and Test reports for all details in brief, patient is a 80 year old male who was recently diagnosed with Parkinson's-and started on Sinemet 3 days prior to this hospital stay, he recently had TURP on 3/4-and then subsequently presented to the ED on 3/8 with abdominal pain-CT abdomen with IV contrast showed a left UVJ calculus-after discussion with urology-patient was discharged home-he was subsequently noted to have a rash on his chest on 3/9-and had a possible seizure-like episode.  He was brought to the hospital-and subsequently admitted to the hospitalist service.   Brief Hospital Course: ?  Seizure-like activity: Thought to be due to autonomic dysfunction-orthostatic hypotension in the setting of Parkinson's disease.  MRI brain negative for acute abnormalities-EEG negative for seizures.  Telemetry was negative for seizures.  Echo on June 2021 at preserved EF.  Although he does have orthostatic vital signs-he is asymptomatic.  We will apply TED hose prior to discharge.Evaluated by neurology-no role for antiepileptics.  Discussed with Dr. Curly Shores on  3/10-okay for discharge-follow-up with primary neurologist on discharge.  Rash: Has completely resolved on 3/10-I do not see any evidence of the rash-Per family-it was mostly on his trunk-and apparently was macular-high suspicion that it was drug-induced-perhaps from IV contrast.  Some concern that it may have been from Sinemet-Sinemet was resumed in the hospital-he has tolerated well-no recurrence of the rash.  He was treated with 1 dose of epinephrine/Benadryl/steroids-we will continue with tapering prednisone for a few more days.  If rash reoccurs-we will need to consider other medications like Sinemet.  AKI: Mild-likely hemodynamically mediated or could be from mild contrast related kidney injury-has resolved.  Asymptomatic bacteriuria/hematuria: Likely reflective of recent TURP.  Per patient's daughter at bedside-patient has been placed on antibiotic by urology post TURP.  Recommendations are to resume his antibiotic regimen per urology.  Parkinson's disease: Resumed Sinemet-follow-up with primary neurology-daughter already making appointment.  HTN: Resume usual antihypertensive medications on discharge  History of BPH-s/p TURP: Follow-up with urology as scheduled  Small left ureteral stone: Follow-up with urology-this was seen on CT abdomen on 3/8-ED MD had discussed with urology-recommendations were to follow-up in the outpatient setting.  Renal function remains stable.   Discharge Diagnoses:  Principal Problem:   Seizure-like activity ([place]) Active Problems:   Renal stone   Rash   Parkinson's disease ([place])   AKI (acute kidney injury) ([place])   Leukocytosis   Discharge Instructions:  Activity:  As tolerated with Full fall precautions use walker/cane & assistance as needed  Discharge Instructions  Call MD for:  extreme fatigue   Complete by: As directed    Call MD for:  persistant dizziness or light-headedness   Complete by: As directed    Call MD for:  persistant nausea  and vomiting   Complete by: As directed    Call MD for:  severe uncontrolled pain   Complete by: As directed    Diet - low sodium heart healthy   Complete by: As directed    Discharge instructions   Complete by: As directed    Follow with Primary MD  [name], MD in 1-2 weeks  Please follow-up with your primary neurologist and primary urologist in the next week or so.  Refrain from driving/operating heavy machinery/activities at heights-until seen by a primary neurologist.  Please ask your primary care practitioner to follow blood cultures that were drawn on admission-currently it is pending.   Please get a complete blood count and chemistry panel checked by your Primary MD at your next visit, and again as instructed by your Primary MD.  Get Medicines reviewed and adjusted: Please take all your medications with you for your next visit with your Primary MD  Laboratory/radiological data: Please request your Primary MD to go over all hospital tests and procedure/radiological results at the follow up, please ask your Primary MD to get all Hospital records sent to his/her office.  In some cases, they will be blood work, cultures and biopsy results pending at the time of your discharge. Please request that your primary care M.D. follows up on these results.  Also Note the following: If you experience worsening of your admission symptoms, develop shortness of breath, life threatening emergency, suicidal or homicidal thoughts you must seek medical attention immediately by calling 911 or calling your MD immediately  if symptoms less severe.  You must read complete instructions/literature along with all the possible adverse reactions/side effects for all the Medicines you take and that have been prescribed to you. Take any new Medicines after you have completely understood and accpet all the possible adverse reactions/side effects.   Do not drive when taking Pain medications or sleeping  medications (Benzodaizepines)  Do not take more than prescribed Pain, Sleep and Anxiety Medications. It is not advisable to combine anxiety,sleep and pain medications without talking with your primary care practitioner  Special Instructions: If you have smoked or chewed Tobacco  in the last 2 yrs please stop smoking, stop any regular Alcohol  and or any Recreational drug use.  Wear Seat belts while driving.  Please note: You were cared for by a hospitalist during your hospital stay. Once you are discharged, your primary care physician will handle any further medical issues. Please note that NO REFILLS for any discharge medications will be authorized once you are discharged, as it is imperative that you return to your primary care physician (or establish a relationship with a primary care physician if you do not have one) for your post hospital discharge needs so that they can reassess your need for medications and monitor your lab values.   Increase activity slowly   Complete by: As directed      Allergies as of 09/30/2020      Reactions   Cialis [tadalafil] Other (See Comments)   Severe back pain   Milk-related Compounds Diarrhea, Other (See Comments)   Upsets the stomach   Pneumovax [pneumococcal Polysaccharide Vaccine] Swelling, Other (See Comments)   Arm swelling and pain   Lac Bovis Diarrhea, Other (See Comments)  Upsets the stomach   Simvastatin Other (See Comments)   Nightmares   Ace Inhibitors Cough   Contrast Media [iodinated Diagnostic Agents] Itching, Rash, Other (See Comments)   POSSIBLE delayed reaction = all-over body rash and seizures that began after this was used   Sinemet [carbidopa W-levodopa] Itching, Rash, Other (See Comments)   POSSIBLE delayed reaction = all-over body rash and seizures that began after starting this      Medication List    TAKE these medications   amLODipine 5 MG tablet Commonly known as: NORVASC Take 5 mg by mouth 2 (two) times daily.    aspirin EC 81 MG tablet Take 162 mg by mouth at bedtime. Swallow whole.   Besivance 0.6 % Susp Generic drug: Besifloxacin HCl Place 1 drop into the right eye See admin instructions. Instill 1 drop into the right eye three times a a day for for 3 days after the monthly shot   carbidopa-levodopa 25-100 MG tablet Commonly known as: SINEMET IR Take 1 tablet by mouth 3 (three) times daily. 7am/11am/4pm What changed:   how much to take  when to take this  additional instructions   cholecalciferol 25 MCG (1000 UNIT) tablet Commonly known as: VITAMIN D3 Take 1,000 Units by mouth daily.   Co Q-10 100 MG Caps Take 100 mg by mouth daily.   docusate sodium 100 MG capsule Commonly known as: COLACE Take 1 capsule (100 mg total) by mouth every 12 (twelve) hours. What changed:   when to take this  reasons to take this   dorzolamide-timolol 22.3-6.8 MG/ML ophthalmic solution Commonly known as: COSOPT Place 1 drop into both eyes 2 (two) times daily.   famotidine 20 MG tablet Commonly known as: PEPCID Take 1 tablet (20 mg total) by mouth daily. Start taking on: October 01, 2020   HYDROcodone-acetaminophen 5-325 MG tablet Commonly known as: Norco Take 1 tablet by mouth every 6 (six) hours as needed for severe pain.   latanoprost 0.005 % ophthalmic solution Commonly known as: XALATAN Place 1 drop into both eyes at bedtime.   multivitamin with minerals Tabs tablet Take 1 tablet by mouth daily.   NASAL ALLERGY NA Place 1 spray into the nose daily as needed.   prednisoLONE acetate 1 % ophthalmic suspension Commonly known as: PRED FORTE Place 1 drop into both eyes 4 (four) times daily.   predniSONE 10 MG tablet Commonly known as: DELTASONE Take  30 mg daily for 1 day, 20 mg daily for 1 days,10 mg daily for 1 day, then stop   PreserVision/Lutein Caps Take 1 capsule by mouth daily.   Prevagen 10 MG Caps Generic drug: Apoaequorin Take 10 mg by mouth daily.   triamcinolone  55 MCG/ACT Aero nasal inhaler Commonly known as: NASACORT Place 2 sprays into the nose daily as needed (for allergies).   valsartan 80 MG tablet Commonly known as: DIOVAN Take 80 mg by mouth daily.   vitamin B-12 1000 MCG tablet Commonly known as: CYANOCOBALAMIN Take 1,000 mcg by mouth daily.       Allergies  Allergen Reactions  . Cialis [Tadalafil] Other (See Comments)    Severe back pain  . Milk-Related Compounds Diarrhea and Other (See Comments)    Upsets the stomach  . Pneumovax [Pneumococcal Polysaccharide Vaccine] Swelling and Other (See Comments)    Arm swelling and pain  . Lac Bovis Diarrhea and Other (See Comments)    Upsets the stomach  . Simvastatin Other (See Comments)    Nightmares   .  Ace Inhibitors Cough  . Contrast Media [Iodinated Diagnostic Agents] Itching, Rash and Other (See Comments)    POSSIBLE delayed reaction = all-over body rash and seizures that began after this was used  . Sinemet [Carbidopa W-Levodopa] Itching, Rash and Other (See Comments)    POSSIBLE delayed reaction = all-over body rash and seizures that began after starting this    Consultations:   neurology  Other Procedures/Studies: CT HEAD WO CONTRAST  Result Date: 09/29/2020 CLINICAL DATA:  Altered mental status. EXAM: CT HEAD WITHOUT CONTRAST TECHNIQUE: Contiguous axial images were obtained from the base of the skull through the vertex without intravenous contrast. COMPARISON:  None. FINDINGS: Brain: No evidence of acute infarction, hemorrhage, hydrocephalus, extra-axial collection or mass lesion/mass effect. Mild burden of chronic ischemic white matter disease. Vascular: No hyperdense vessel. Atherosclerotic calcifications of the internal carotid and vertebral arteries. Skull: Normal. Negative for fracture or focal lesion. Sinuses/Orbits: Polypoid mucosal thickening of the left maxillary sinus with scattered mucosal thickening of the ethmoid air cells. Other: None IMPRESSION: 1. No acute  intracranial pathology. 2. Mild burden of chronic ischemic white matter disease. Electronically Signed   By: Dahlia Bailiff MD   On: 09/29/2020 13:27   MR Brain W and Wo Contrast  Result Date: 09/29/2020 CLINICAL DATA:  Mental status change.  Tremors. EXAM: MRI HEAD WITHOUT AND WITH CONTRAST TECHNIQUE: Multiplanar, multiecho pulse sequences of the brain and surrounding structures were obtained without and with intravenous contrast. CONTRAST:  8.90mL GADAVIST GADOBUTROL 1 MMOL/ML IV SOLN COMPARISON:  CT head 09/30/2019 FINDINGS: Brain: Generalized atrophy, mild-to-moderate in degree. Negative for hydrocephalus. Mild white matter changes with scattered small white matter hyperintensities bilaterally. Brainstem and cerebellum normal. Negative for acute infarct, hemorrhage, mass.  Normal enhancement. Vascular: Normal arterial flow voids. Skull and upper cervical spine: No focal skeletal abnormality. Sinuses/Orbits: Mild mucosal edema paranasal sinuses. Left mastoid effusion. Bilateral cataract extraction Other: None IMPRESSION: Atrophy and mild white matter changes consistent with chronic microvascular ischemia. No acute intracranial abnormality. Electronically Signed   By: [name] M.D.   On: 09/29/2020 17:40   CT ABDOMEN PELVIS W CONTRAST  Result Date: 09/28/2020 CLINICAL DATA:  Recent TURP procedure 09/23/2020, left lower quadrant pain, nausea for 3 days EXAM: CT ABDOMEN AND PELVIS WITH CONTRAST TECHNIQUE: Multidetector CT imaging of the abdomen and pelvis was performed using the standard protocol following bolus administration of intravenous contrast. CONTRAST:  51mL OMNIPAQUE IOHEXOL 300 MG/ML  SOLN COMPARISON:  None. FINDINGS: Lower chest: No acute pleural or parenchymal lung disease. Hepatobiliary: No focal liver abnormality is seen. No gallstones, gallbladder wall thickening, or biliary dilatation. Pancreas: Unremarkable. No pancreatic ductal dilatation or surrounding inflammatory changes. Spleen:  Normal in size without focal abnormality. Adrenals/Urinary Tract: There is moderate left-sided obstructive uropathy related to a 6 mm obstructing left UVJ calculus, reference image 75/2. The right kidney enhances normally, with no urinary tract calculi or obstruction. The adrenals and bladder are unremarkable. Stomach/Bowel: No bowel obstruction or ileus. Normal appendix right lower quadrant. No bowel wall thickening or inflammatory change. Vascular/Lymphatic: Aortic atherosclerosis. No enlarged abdominal or pelvic lymph nodes. Reproductive: Prostate is enlarged measuring 5.4 x 5.1 cm. Surgical clips are seen within the central aspect of the prostate, compatible with recent TURP procedure. Other: No free fluid or free gas.  No abdominal wall hernia. Musculoskeletal: No acute or destructive bony lesions. Reconstructed images demonstrate no additional findings. IMPRESSION: 1. Moderate left-sided obstructive uropathy related to a 6 mm obstructing left UVJ calculus. 2. Enlarged prostate,  with postsurgical changes from recent trans urethral resection of the prostate. 3.  Aortic Atherosclerosis (ICD10-I70.0). Electronically Signed   By: Randa Ngo M.D.   On: 09/28/2020 21:17   DG Chest Port 1 View  Result Date: 09/29/2020 CLINICAL DATA:  Altered mental status, seizure versus syncope EXAM: PORTABLE CHEST 1 VIEW COMPARISON:  None. FINDINGS: The heart size and mediastinal contours are within normal limits. Aortic atherosclerosis. Both lungs are clear. The visualized skeletal structures are unremarkable. IMPRESSION: No acute cardiopulmonary disease. Electronically Signed   By: Dahlia Bailiff MD   On: 09/29/2020 13:24   EEG adult  Result Date: 09/30/2020 Lora Havens, MD     09/30/2020  8:41 AM Patient Name: Seth Carter MRN: [id number] Epilepsy Attending: Lora Havens Referring Physician/Provider: Pati Gallo, PA Date: 09/29/2020 Duration: 20.50 mins Patient history: 80 year old male with a history of  recently diagnosed Parkinson's disease and newly prescribed Sinemet (started taking his medication approximately 3 days ago), prior syncopal spells and hyperlipidemia recently discharged from the hospital on 3/4 after a TURP who presents with a spell of AMS and tremor at home with recovery followed by a brief episode of tonic motor activity of limbs, trunk and eyes with unresponsiveness after standing up with EMS. EEG to evaluate for seizure Level of alertness: Awake, drowsy, sleep, comatose, lethargic AEDs during EEG study: None Technical aspects: This EEG study was done with scalp electrodes positioned according to the 10-20 International system of electrode placement. Electrical activity was acquired at a sampling rate of 500Hz  and reviewed with a high frequency filter of 70Hz  and a low frequency filter of 1Hz . EEG data were recorded continuously and digitally stored. Description: The posterior dominant rhythm consists of 7 Hz activity of moderate voltage (25-35 uV) seen predominantly in posterior head regions, symmetric and reactive to eye opening and eye closing. Hyperventilation did not show any EEG change.  Physiologic photic driving was not seen during photic stimulation.  ABNORMALITY - background slow IMPRESSION: This study is suggestive of mild diffuse encephalopathy, nonspecific etiology. No seizures or epileptiform discharges were seen throughout the recording. Priyanka Barbra Sarks     TODAY-DAY OF DISCHARGE:  Subjective:   Kell Ferris today has no headache,no chest abdominal pain,no new weakness tingling or numbness, feels much better wants to go home today.   Objective:   Blood pressure (!) 191/90, pulse 61, temperature (!) 97.5 F (36.4 C), temperature source Axillary, resp. rate 20, height 5\' 11"  (1.803 m), weight 86.2 kg, SpO2 99 %.  Intake/Output Summary (Last 24 hours) at 09/30/2020 1452 Last data filed at 09/30/2020 0700 Gross per 24 hour  Intake 444.95 ml  Output 500 ml  Net -55.05  ml   Filed Weights   09/29/20 1155  Weight: 86.2 kg    Exam: Awake Alert, Oriented *3, No new F.N deficits, Normal affect .AT,PERRAL Supple Neck,No JVD, No cervical lymphadenopathy appriciated.  Symmetrical Chest wall movement, Good air movement bilaterally, CTAB RRR,No Gallops,Rubs or new Murmurs, No Parasternal Heave +ve B.Sounds, Abd Soft, Non tender, No organomegaly appriciated, No rebound -guarding or rigidity. No Cyanosis, Clubbing or edema, No new Rash or bruise   PERTINENT RADIOLOGIC STUDIES: CT HEAD WO CONTRAST  Result Date: 09/29/2020 CLINICAL DATA:  Altered mental status. EXAM: CT HEAD WITHOUT CONTRAST TECHNIQUE: Contiguous axial images were obtained from the base of the skull through the vertex without intravenous contrast. COMPARISON:  None. FINDINGS: Brain: No evidence of acute infarction, hemorrhage, hydrocephalus, extra-axial collection or mass lesion/mass  effect. Mild burden of chronic ischemic white matter disease. Vascular: No hyperdense vessel. Atherosclerotic calcifications of the internal carotid and vertebral arteries. Skull: Normal. Negative for fracture or focal lesion. Sinuses/Orbits: Polypoid mucosal thickening of the left maxillary sinus with scattered mucosal thickening of the ethmoid air cells. Other: None IMPRESSION: 1. No acute intracranial pathology. 2. Mild burden of chronic ischemic white matter disease. Electronically Signed   By: Dahlia Bailiff MD   On: 09/29/2020 13:27   MR Brain W and Wo Contrast  Result Date: 09/29/2020 CLINICAL DATA:  Mental status change.  Tremors. EXAM: MRI HEAD WITHOUT AND WITH CONTRAST TECHNIQUE: Multiplanar, multiecho pulse sequences of the brain and surrounding structures were obtained without and with intravenous contrast. CONTRAST:  8.81mL GADAVIST GADOBUTROL 1 MMOL/ML IV SOLN COMPARISON:  CT head 09/30/2019 FINDINGS: Brain: Generalized atrophy, mild-to-moderate in degree. Negative for hydrocephalus. Mild white matter changes  with scattered small white matter hyperintensities bilaterally. Brainstem and cerebellum normal. Negative for acute infarct, hemorrhage, mass.  Normal enhancement. Vascular: Normal arterial flow voids. Skull and upper cervical spine: No focal skeletal abnormality. Sinuses/Orbits: Mild mucosal edema paranasal sinuses. Left mastoid effusion. Bilateral cataract extraction Other: None IMPRESSION: Atrophy and mild white matter changes consistent with chronic microvascular ischemia. No acute intracranial abnormality. Electronically Signed   By: [name] M.D.   On: 09/29/2020 17:40   CT ABDOMEN PELVIS W CONTRAST  Result Date: 09/28/2020 CLINICAL DATA:  Recent TURP procedure 09/23/2020, left lower quadrant pain, nausea for 3 days EXAM: CT ABDOMEN AND PELVIS WITH CONTRAST TECHNIQUE: Multidetector CT imaging of the abdomen and pelvis was performed using the standard protocol following bolus administration of intravenous contrast. CONTRAST:  67mL OMNIPAQUE IOHEXOL 300 MG/ML  SOLN COMPARISON:  None. FINDINGS: Lower chest: No acute pleural or parenchymal lung disease. Hepatobiliary: No focal liver abnormality is seen. No gallstones, gallbladder wall thickening, or biliary dilatation. Pancreas: Unremarkable. No pancreatic ductal dilatation or surrounding inflammatory changes. Spleen: Normal in size without focal abnormality. Adrenals/Urinary Tract: There is moderate left-sided obstructive uropathy related to a 6 mm obstructing left UVJ calculus, reference image 75/2. The right kidney enhances normally, with no urinary tract calculi or obstruction. The adrenals and bladder are unremarkable. Stomach/Bowel: No bowel obstruction or ileus. Normal appendix right lower quadrant. No bowel wall thickening or inflammatory change. Vascular/Lymphatic: Aortic atherosclerosis. No enlarged abdominal or pelvic lymph nodes. Reproductive: Prostate is enlarged measuring 5.4 x 5.1 cm. Surgical clips are seen within the central aspect of the  prostate, compatible with recent TURP procedure. Other: No free fluid or free gas.  No abdominal wall hernia. Musculoskeletal: No acute or destructive bony lesions. Reconstructed images demonstrate no additional findings. IMPRESSION: 1. Moderate left-sided obstructive uropathy related to a 6 mm obstructing left UVJ calculus. 2. Enlarged prostate, with postsurgical changes from recent trans urethral resection of the prostate. 3.  Aortic Atherosclerosis (ICD10-I70.0). Electronically Signed   By: Randa Ngo M.D.   On: 09/28/2020 21:17   DG Chest Port 1 View  Result Date: 09/29/2020 CLINICAL DATA:  Altered mental status, seizure versus syncope EXAM: PORTABLE CHEST 1 VIEW COMPARISON:  None. FINDINGS: The heart size and mediastinal contours are within normal limits. Aortic atherosclerosis. Both lungs are clear. The visualized skeletal structures are unremarkable. IMPRESSION: No acute cardiopulmonary disease. Electronically Signed   By: Dahlia Bailiff MD   On: 09/29/2020 13:24   EEG adult  Result Date: 09/30/2020 Lora Havens, MD     09/30/2020  8:41 AM Patient Name: Riki Altes MRN:  [id number] Epilepsy Attending: Lora Havens Referring Physician/Provider: Pati Gallo, PA Date: 09/29/2020 Duration: 20.50 mins Patient history: 80 year old male with a history of recently diagnosed Parkinson's disease and newly prescribed Sinemet (started taking his medication approximately 3 days ago), prior syncopal spells and hyperlipidemia recently discharged from the hospital on 3/4 after a TURP who presents with a spell of AMS and tremor at home with recovery followed by a brief episode of tonic motor activity of limbs, trunk and eyes with unresponsiveness after standing up with EMS. EEG to evaluate for seizure Level of alertness: Awake, drowsy, sleep, comatose, lethargic AEDs during EEG study: None Technical aspects: This EEG study was done with scalp electrodes positioned according to the 10-20 International system  of electrode placement. Electrical activity was acquired at a sampling rate of 500Hz  and reviewed with a high frequency filter of 70Hz  and a low frequency filter of 1Hz . EEG data were recorded continuously and digitally stored. Description: The posterior dominant rhythm consists of 7 Hz activity of moderate voltage (25-35 uV) seen predominantly in posterior head regions, symmetric and reactive to eye opening and eye closing. Hyperventilation did not show any EEG change.  Physiologic photic driving was not seen during photic stimulation.  ABNORMALITY - background slow IMPRESSION: This study is suggestive of mild diffuse encephalopathy, nonspecific etiology. No seizures or epileptiform discharges were seen throughout the recording. Priyanka Barbra Sarks     PERTINENT LAB RESULTS: CBC: Recent Labs    09/29/20 1237 09/30/20 0435  WBC 12.0* 8.3  HGB 14.2 13.9  HCT 42.1 40.3  PLT 189 196   CMET CMP     Component Value Date/Time   NA 135 09/30/2020 0435   K 4.5 09/30/2020 0435   CL 104 09/30/2020 0435   CO2 22 09/30/2020 0435   GLUCOSE 192 (H) 09/30/2020 0435   BUN 23 09/30/2020 0435   CREATININE 1.16 09/30/2020 0435   CALCIUM 9.1 09/30/2020 0435   PROT 6.7 09/29/2020 1237   ALBUMIN 3.4 (L) 09/29/2020 1237   AST 23 09/29/2020 1237   ALT 20 09/29/2020 1237   ALKPHOS 85 09/29/2020 1237   BILITOT 1.5 (H) 09/29/2020 1237   GFRNONAA >60 09/30/2020 0435    GFR Estimated Creatinine Clearance: 52.3 mL/min (by C-G formula based on SCr of 1.16 mg/dL). Recent Labs    09/28/20 1839  LIPASE 49   No results for input(s): CKTOTAL, CKMB, CKMBINDEX, TROPONINI in the last 72 hours. Invalid input(s): POCBNP No results for input(s): DDIMER in the last 72 hours. No results for input(s): HGBA1C in the last 72 hours. No results for input(s): CHOL, HDL, LDLCALC, TRIG, CHOLHDL, LDLDIRECT in the last 72 hours. Recent Labs    09/29/20 1237  TSH 1.096   No results for input(s): VITAMINB12, FOLATE,  FERRITIN, TIBC, IRON, RETICCTPCT in the last 72 hours. Coags: No results for input(s): INR in the last 72 hours.  Invalid input(s): PT Microbiology: Recent Results (from the past 240 hour(s))  SARS CORONAVIRUS 2 (TAT 6-24 HRS) Nasopharyngeal Nasopharyngeal Swab     Status: None   Collection Time: 09/29/20  3:25 PM   Specimen: Nasopharyngeal Swab  Result Value Ref Range Status   SARS Coronavirus 2 NEGATIVE NEGATIVE Final    Comment: (NOTE) SARS-CoV-2 target nucleic acids are NOT DETECTED.  The SARS-CoV-2 RNA is generally detectable in upper and lower respiratory specimens during the acute phase of infection. Negative results do not preclude SARS-CoV-2 infection, do not rule out co-infections with other pathogens, and should  not be used as the sole basis for treatment or other patient management decisions. Negative results must be combined with clinical observations, patient history, and epidemiological information. The expected result is Negative.  Fact Sheet for Patients: SugarRoll.be  Fact Sheet for Healthcare Providers: https://www.woods-mathews.com/  This test is not yet approved or cleared by the [place] FDA and  has been authorized for detection and/or diagnosis of SARS-CoV-2 by FDA under an Emergency Use Authorization (EUA). This EUA will remain  in effect (meaning this test can be used) for the duration of the COVID-19 declaration under Se ction 564(b)(1) of the Act, 21 U.S.C. section 360bbb-3(b)(1), unless the authorization is terminated or revoked sooner.  Performed at [facility], [place] [address], [place], [place] [address]   Culture, blood (routine x 2)     Status: None (Preliminary result)   Collection Time: 09/29/20  9:19 PM   Specimen: BLOOD  Result Value Ref Range Status   Specimen Description BLOOD BLOOD LEFT FOREARM  Final   Special Requests   Final    BOTTLES DRAWN AEROBIC AND ANAEROBIC Blood Culture  results may not be optimal due to an inadequate volume of blood received in culture bottles   Culture   Final    NO GROWTH < 24 HOURS Performed at [facility], [place] [address] [place], [place] [address]    Report Status PENDING  Incomplete  Culture, blood (routine x 2)     Status: None (Preliminary result)   Collection Time: 09/29/20  9:42 PM   Specimen: BLOOD  Result Value Ref Range Status   Specimen Description BLOOD RIGHT ANTECUBITAL  Final   Special Requests   Final    BOTTLES DRAWN AEROBIC AND ANAEROBIC Blood Culture adequate volume   Culture   Final    NO GROWTH < 24 HOURS Performed at [facility], [place] [address] [place],  [address]    Report Status PENDING  Incomplete    FURTHER DISCHARGE INSTRUCTIONS:  Get Medicines reviewed and adjusted: Please take all your medications with you for your next visit with your Primary MD  Laboratory/radiological data: Please request your Primary MD to go over all hospital tests and procedure/radiological results at the follow up, please ask your Primary MD to get all Hospital records sent to his/her office.  In some cases, they will be blood work, cultures and biopsy results pending at the time of your discharge. Please request that your primary care M.D. goes through all the records of your hospital data and follows up on these results.  Also Note the following: If you experience worsening of your admission symptoms, develop shortness of breath, life threatening emergency, suicidal or homicidal thoughts you must seek medical attention immediately by calling 911 or calling your MD immediately  if symptoms less severe.  You must read complete instructions/literature along with all the possible adverse reactions/side effects for all the Medicines you take and that have been prescribed to you. Take any new Medicines after you have completely understood and accpet all the possible adverse reactions/side effects.   Do not  drive when taking Pain medications or sleeping medications (Benzodaizepines)  Do not take more than prescribed Pain, Sleep and Anxiety Medications. It is not advisable to combine anxiety,sleep and pain medications without talking with your primary care practitioner  Special Instructions: If you have smoked or chewed Tobacco  in the last 2 yrs please stop smoking, stop any regular Alcohol  and or any Recreational  drug use.  Wear Seat belts while driving.  Please note: You were cared for by a hospitalist during your hospital stay. Once you are discharged, your primary care physician will handle any further medical issues. Please note that NO REFILLS for any discharge medications will be authorized once you are discharged, as it is imperative that you return to your primary care physician (or establish a relationship with a primary care physician if you do not have one) for your post hospital discharge needs so that they can reassess your need for medications and monitor your lab values.  Total Time spent coordinating discharge including counseling, education and face to face time equals 35 minutes.  SignedOren Binet 09/30/2020 2:52 PM

## 2020-09-30 NOTE — Progress Notes (Signed)
Orthostatic Vital Signs:  Lying: 167/83 (pulse 60) Sitting: 152/82 (pulse 63) Standing 0 min: 125/78 (pulse 67) Standing 3 min: 136/74 (pulse 65)  Patient denied any feelings of lightheadedness or changed vision throughout.

## 2020-09-30 NOTE — Procedures (Signed)
Patient Name: Seth Carter  MRN: [id number]  Epilepsy Attending: Lora Havens  Referring Physician/Provider: Pati Gallo, PA Date: 09/29/2020 Duration: 20.50 mins  Patient history: 80 year old male with a history of recently diagnosed Parkinson's disease and newly prescribed Sinemet (started taking his medication approximately 3 days ago), prior syncopal spells and hyperlipidemia recently discharged from the hospital on 3/4 after a TURP who presents with a spell of AMS and tremor at home with recovery followed by a brief episode of tonic motor activity of limbs, trunk and eyes with unresponsiveness after standing up with EMS. EEG to evaluate for seizure  Level of alertness: Awake, drowsy, sleep, comatose, lethargic  AEDs during EEG study: None  Technical aspects: This EEG study was done with scalp electrodes positioned according to the 10-20 International system of electrode placement. Electrical activity was acquired at a sampling rate of 500Hz  and reviewed with a high frequency filter of 70Hz  and a low frequency filter of 1Hz . EEG data were recorded continuously and digitally stored.   Description: The posterior dominant rhythm consists of 7 Hz activity of moderate voltage (25-35 uV) seen predominantly in posterior head regions, symmetric and reactive to eye opening and eye closing. Hyperventilation did not show any EEG change.  Physiologic photic driving was not seen during photic stimulation.    ABNORMALITY - background slow  IMPRESSION: This study is suggestive of mild diffuse encephalopathy, nonspecific etiology. No seizures or epileptiform discharges were seen throughout the recording.  [name] Barbra Sarks

## 2020-10-01 ENCOUNTER — Telehealth: Payer: Self-pay

## 2020-10-01 NOTE — Telephone Encounter (Signed)
TCM note documentated. Daughter has no concerns today, patient is doing well. Will follow up on Monday the 14th with Dr.Rebecca Tat. The daughers are staying with her dad all weekend, they thanked me for calling.

## 2020-10-01 NOTE — Telephone Encounter (Signed)
Transition Care Management Follow-up Telephone Call    Date discharged? 10/01/2019  How have you been since you were released from the hospital? Doing great!  Any patient concerns? none at this time  Items Reviewed:  Medications reviewed: yes  Allergies reviewed: yes  Dietary changes reviewed: yes  Referrals reviewed: yes, Appt with Dr. Carles Collet on Monday March 14,2022   Functional Questionnaire:  Independent  Dependent - Patient is dependent   Activities of Daily Living (ADLs):    Personal hygiene -yes Dressing - yesEating -yes Maintaining continence - yes Transferring -yes  Independent Activities of Daily Living (iADLs): Basic communication skills -yesTransportation - yes Meal preparation  - yesShopping - yes Housework - yes Managing medications - yes Managing personal finances - yes   Confirmed importance and date/time of follow-up visits scheduled yes  Provider Appointment booked with Dr.Rebecca Tat  Confirmed with patient if condition begins to worsen call PCP or go to the ER.  Patient was given the office number and encouraged to call back with question or concerns: Yes

## 2020-10-04 ENCOUNTER — Ambulatory Visit (INDEPENDENT_AMBULATORY_CARE_PROVIDER_SITE_OTHER): Payer: Medicare Other | Admitting: Neurology

## 2020-10-04 ENCOUNTER — Encounter: Payer: Self-pay | Admitting: Neurology

## 2020-10-04 ENCOUNTER — Other Ambulatory Visit: Payer: Self-pay

## 2020-10-04 ENCOUNTER — Encounter (INDEPENDENT_AMBULATORY_CARE_PROVIDER_SITE_OTHER): Payer: Medicare Other | Admitting: Ophthalmology

## 2020-10-04 VITALS — BP 112/70 | HR 66 | Ht 71.0 in | Wt 190.0 lb

## 2020-10-04 DIAGNOSIS — H34832 Tributary (branch) retinal vein occlusion, left eye, with macular edema: Secondary | ICD-10-CM | POA: Diagnosis not present

## 2020-10-04 DIAGNOSIS — G20 Parkinson's disease: Secondary | ICD-10-CM

## 2020-10-04 DIAGNOSIS — R55 Syncope and collapse: Secondary | ICD-10-CM

## 2020-10-04 DIAGNOSIS — T50905A Adverse effect of unspecified drugs, medicaments and biological substances, initial encounter: Secondary | ICD-10-CM | POA: Diagnosis not present

## 2020-10-04 DIAGNOSIS — H35033 Hypertensive retinopathy, bilateral: Secondary | ICD-10-CM

## 2020-10-04 DIAGNOSIS — R413 Other amnesia: Secondary | ICD-10-CM | POA: Diagnosis not present

## 2020-10-04 DIAGNOSIS — H33302 Unspecified retinal break, left eye: Secondary | ICD-10-CM | POA: Diagnosis not present

## 2020-10-04 DIAGNOSIS — H43813 Vitreous degeneration, bilateral: Secondary | ICD-10-CM | POA: Diagnosis not present

## 2020-10-04 DIAGNOSIS — I10 Essential (primary) hypertension: Secondary | ICD-10-CM | POA: Diagnosis not present

## 2020-10-04 DIAGNOSIS — D3131 Benign neoplasm of right choroid: Secondary | ICD-10-CM

## 2020-10-04 DIAGNOSIS — R251 Tremor, unspecified: Secondary | ICD-10-CM | POA: Diagnosis not present

## 2020-10-04 DIAGNOSIS — H353132 Nonexudative age-related macular degeneration, bilateral, intermediate dry stage: Secondary | ICD-10-CM | POA: Diagnosis not present

## 2020-10-04 LAB — CULTURE, BLOOD (ROUTINE X 2)
Culture: NO GROWTH
Special Requests: ADEQUATE

## 2020-10-04 NOTE — Patient Instructions (Addendum)
1.  Online Resources for Power over Parkinson's Group March 2022  . Local [place] Online Groups  o Power over [employer] Group :   - Power Over Parkinson's Patient Education Group will be Wednesday, March 9th at 2pm via Zoom.   - Upcoming Power over Parkinson's Meetings:  2nd Wednesdays of the month at 2 pm:       April 13th, May 11th - Contact Amy Marriott at [email or web address] if interested in participating in this online group o Parkinson's Care Partners Group:    3rd Mondays, Contact Corwin Levins o Atypical Parkinsonian Patient Group:   4th Wednesdays, Contact Corwin Levins o If you are interested in participating in these online groups with Judson Roch, please contact her directly for how to join those meetings.  Her contact information is [email or web address].  She will send you a link to join the [employer].  (Please note that Corwin Levins , MSW, LCSW, has resigned her position at [employer] [facility], but will continue to lead the online groups temporarily)  . [place]:  [email or web address] o PD Health at Home continues:  Mindfulness Mondays, Expert Briefing Tuesdays, Wellness Wednesdays, Take Time Thursdays, Fitness Fridays -Listings for March 2022 are on the website o Upcoming Webinar:  Conversations about Complementary Therapies and PD.  Wednesday, March 2nd @ 1 pm o Upcoming Webinar:  Can We Put the Brakes on PD Progression?  Wednesday, April 6th @ 1 pm o Geneticist, molecular) at [email or web address] o  Please check out their website to sign up for emails and see their full online offerings  . [place]:  [email or web address]  o Upcoming Webinar:   Trouble Sleeping?  What to Know about Acting out Dreams and other Sleep Issues.  Thursday, March 17th @ 12 noon o Check out additional information on their website to see their full online offerings  . [place]:   [email or web address] o Upcoming Webinar:  Women and Parkinson's.  Tuesday, March 8th at 2 pm o Care Partner Monthly Meetup.  With 06-17-1995 [place].  First Tuesday of each month, 2 pm o Check out additional information to Live Well Today on their website  . Parkinson and Movement Disorders (PMD) Alliance:  www.pmdalliance.org o [employer] Online:  Online Education Events o Sign up for emails, which are sent weekly to give you updates on programming and online offerings   . Parkinson's Association of the [place]:  www.parkinsonassociation.org o Information on online support groups, education events, and online exercises including Yoga, Parkinson's exercises and more-LOTS of information on links to PD resources and online events o Virtual Support Group through Parkinson's Association of the [place]; next one is scheduled for Wednesday, October 27, 2020 at 2 pm. (These are typically scheduled for the 1st Wednesday of the month at 2 pm).  Visit website for details.  . Additional links for movement activities: o PWR! Moves Classes at [employer] RESUMED!  Wednesdays 10 and 11 am.  Contact Amy Marriott, PT [email or web address] or [phone] if interested o Here is a link to the PWR!Moves classes on Zoom from [phone] - Daily Mon-Sat at 10:00. Via Zoom, FREE and open to all.  There is also a link below via Facebook if you use that platform. - [place] - [email or web address]://[email or web address] o Parkinson's Wellness Recovery (PWR! Moves)  www.[email or web address] - Info on the PWR! Virtual Experience:  You will have access to our expertise through self-assessment, guided plans that start with the PD-specific fundamentals, educational content, tips, Q&A with  an expert, and a  Armed forces operational officer of PD-specific pre-recorded and live exercise classes of varying types and intensity - both physical and cognitive! If that is not enough, we offer 1:1 wellness consultations (in-person or virtual) to personalize your PWR! Research scientist (medical).  - Check out the PWR! Move of the month on the [place] [facility] website:  [email or web address] o [employer] Fridays:  - As part of the PD Health @ Home program, this free video series focuses each week on one aspect of fitness designed to support people living with Parkinson's.  These weekly videos highlight the [place] recent fitness guidelines for people with Parkinson's disease. -  HollywoodSale.dk o Dance for PD website is offering free, live-stream classes throughout the week, as well as links to [employer] of classes:  [email or web address].[email or web address]/ o Dance for Parkinson's Class:  [place].  Free offering for people with Parkinson's and care partners; virtual class.  o For more information, contact [phone] or email Ruffin Frederick at [email or web address] o Virtual dance and Pilates for Parkinson's classes: Click on the Community Tab> Parkinson's Movement Initiative Tab.  To register for classes and for more information, visit www.[email or web address] and click the "community" tab.     o [employer] Parkinson's Cycling Classes  - [facility] [employer]: 1pm on Fridays-Live classes at [facility] [employer] at [email or web address] or [phone]) [facility] [employer]: Virtual Classes Mondays and Thursdays (contact [place] at [email or web address] or [phone])   o [address] - Three levels of classes are offered Tuesdays and Thursdays:  10:30 am,  12 noon & 1:45 pm at [facility].  - Active Stretching with  Paula Compton Class starting in March, on Fridays - To observe a class or for  more information, call [phone] or email [email or web address] . [employer]: o Chief Technology Officer Opportunities:  www.well-springsolutions.org/caregiver-education/caregiver-support-group.  You may also contact Vickki Muff at [email or web address] or [phone].   o Virtual Caregiver Retreat, Monday, February 21st, 3-5 pm o Powerful Tools for Caregivers, 6-wk series for caregivers, in-person, Thursdays March 10, 17, 24, 31 and April 7, 14, from 10:30-12:30 at 06-09-1974, [place].  Contact [address] (see above) to register o Well-Spring Navigator:  Just1Navigator program, a free service to help individuals and families through the journey of determining care for older adults.  The "Navigator" is a Vickki Muff, Education officer, museum, who will speak with a prospective client and/or loved ones to provide an assessment of the situation and a set of recommendations for a personalized care plan -- all free of charge, and whether [facility] [employer] offers the needed service or not. If the need is not a service we provide, we are well-connected with reputable programs in town that we can refer you to.  www.well-springsolutions.org or to speak with the Navigator, call [phone]. o  2.  You have been referred for a neurocognitive evaluation (i.e., evaluation of memory and thinking abilities). Please bring someone with you to this appointment if possible, as it is helpful for the neuropsychologist to hear from both you and another adult who knows you well. Please bring eyeglasses and hearing aids if you wear them.    The evaluation will take approximately 3 hours and has two parts:   . The first part is a clinical interview with the neuropsychologist, Dr. [phone] or Dr. Melvyn Novas. During the interview, the neuropsychologist will speak with you and the individual you brought  to the appointment.    . The second part of  the evaluation is testing with the doctor's technician, aka psychometrician, Hinton Dyer or [employer]. During the testing, the technician will ask you to remember different types of material, solve problems, and answer some questionnaires. Your family member will not be present for this portion of the evaluation.   Please note: We have to reserve several hours of the neuropsychologist's time and the psychometrician's time for your evaluation appointment. As such, there is a No-Show fee of $100. If you are unable to attend any of your appointments, please contact our office as soon as possible to reschedule.  3.  No driving yet  4.  We will make an appt for you with allergy/immunology  5.  Hold carbidopa/levodopa for now.  6.  Let us know if you end up transferring care to [facility]

## 2020-10-04 NOTE — Addendum Note (Signed)
Addended by: Ulice Brilliant T on: 10/04/2020 01:18 PM   Modules accepted: Orders

## 2020-10-04 NOTE — Addendum Note (Signed)
Addended by: Ulice Brilliant T on: 10/04/2020 01:34 PM   Modules accepted: Orders

## 2020-10-05 LAB — CULTURE, BLOOD (ROUTINE X 2): Culture: NO GROWTH

## 2020-10-07 DIAGNOSIS — R3912 Poor urinary stream: Secondary | ICD-10-CM | POA: Diagnosis not present

## 2020-10-07 DIAGNOSIS — N401 Enlarged prostate with lower urinary tract symptoms: Secondary | ICD-10-CM | POA: Diagnosis not present

## 2020-10-07 DIAGNOSIS — R8279 Other abnormal findings on microbiological examination of urine: Secondary | ICD-10-CM | POA: Diagnosis not present

## 2020-10-11 DIAGNOSIS — R404 Transient alteration of awareness: Secondary | ICD-10-CM | POA: Diagnosis not present

## 2020-10-11 DIAGNOSIS — G20 Parkinson's disease: Secondary | ICD-10-CM | POA: Diagnosis not present

## 2020-10-11 DIAGNOSIS — I951 Orthostatic hypotension: Secondary | ICD-10-CM | POA: Diagnosis not present

## 2020-10-19 ENCOUNTER — Other Ambulatory Visit: Payer: Self-pay

## 2020-10-19 ENCOUNTER — Ambulatory Visit: Payer: Medicare Other | Attending: Neurology | Admitting: Physical Therapy

## 2020-10-19 ENCOUNTER — Encounter: Payer: Self-pay | Admitting: Physical Therapy

## 2020-10-19 VITALS — BP 118/64 | HR 57

## 2020-10-19 DIAGNOSIS — M6281 Muscle weakness (generalized): Secondary | ICD-10-CM | POA: Diagnosis not present

## 2020-10-19 DIAGNOSIS — R2681 Unsteadiness on feet: Secondary | ICD-10-CM | POA: Diagnosis not present

## 2020-10-19 DIAGNOSIS — R29818 Other symptoms and signs involving the nervous system: Secondary | ICD-10-CM | POA: Diagnosis not present

## 2020-10-19 DIAGNOSIS — R293 Abnormal posture: Secondary | ICD-10-CM | POA: Insufficient documentation

## 2020-10-19 DIAGNOSIS — R2689 Other abnormalities of gait and mobility: Secondary | ICD-10-CM | POA: Diagnosis not present

## 2020-10-20 NOTE — Therapy (Signed)
[facility] [address] [place], [place], [address] Phone: [phone]   Fax:  [phone]  Physical Therapy Evaluation  Patient Details  Name: Seth Carter MRN: [id number] Date of Birth: 10-18-1940 Referring Provider (PT): Alonza Bogus, DO   Encounter Date: 10/19/2020   PT End of Session - 10/20/20 1610    Visit Number 1    Number of Visits 17    Date for PT Re-Evaluation 01/18/21    Authorization Type Medicare and UHC-Follow Medicare guidelines    PT Start Time 4709    PT Stop Time 1324    PT Time Calculation (min) 49 min    Equipment Utilized During Treatment Gait belt    Activity Tolerance Patient tolerated treatment well    Behavior During Therapy [facility] for tasks assessed/performed           Past Medical History:  Diagnosis Date  . Allergic rhinitis, seasonal   . B12 deficiency   . BPH with obstruction/lower urinary tract symptoms   . Bradykinesia    neruologist--- dr tat  . Chronic back pain   . Chronically dry eyes   . DDD (degenerative disc disease), lumbar   . ED (erectile dysfunction)   . Essential hypertension    nuclear test in epic 01-16-2020 normal perfusion no ischemia, normal lvf and wall motion, nuclear ef 65%  . Family history of hemochromatosis   . Fatigue   . Generalized weakness   . GERD (gastroesophageal reflux disease)   . Glaucoma, both eyes   . Hemorrhoids   . History of chronic sinusitis   . History of kidney stones   . History of syncope    cardiology evaluation by dr Marlou Porch, note in epic 12-26-2019, work-up includes event monitor 02-04-2020( showed SR with occ PAC & PVC rare PAT)/ echo  12-26-2019 (mild LVH, ef 50-55%, moderate MR, mild to moderate AV sclerosis no stenosis, ascending aorta 39mm) and nuclear test 01-16-2020 (normal perfusion w/ normal LV and wall motion, nuclear ef 65%)  . Hypogonadism in male   . Macular degeneration, right eye   . Mixed hyperlipidemia   . OA  (osteoarthritis)   . Other spondylosis with radiculopathy, cervical region   . Parkinson's disease [facility]) 08/2020   neurologist--- dr tat--  newly dx 02/ 2022  . Sinusitis, acute 08/2020   per pt started antibiotic on 09-20-2020, has cough nasal congestion, cough, no fever  . Sting of hornets, wasps, and bees causing poisoning and toxic reactions   . Vitamin D deficiency   . Wears glasses     Past Surgical History:  Procedure Laterality Date  . ANKLE SURGERY Right 10-04-2015  @[facility]    debridement achilles tendon & reattachment and heel excision osteophyte  . CATARACT EXTRACTION W/ INTRAOCULAR LENS  IMPLANT, BILATERAL  yrs ago  . CYSTOSCOPY WITH INSERTION OF UROLIFT  2019 approx.  Marland Kitchen SHOULDER SURGERY Right 1990's  . TONSILLECTOMY  age 33  . TRANSURETHRAL RESECTION OF PROSTATE N/A 09/23/2020   Procedure: TRANSURETHRAL RESECTION OF THE PROSTATE (TURP);  Surgeon: [name], MD;  Location: [facility];  Service: Urology;  Laterality: N/A;  75 MINS    Vitals:   10/19/20 1302 10/19/20 1303  BP: (!) 143/73 118/64  Pulse: (!) 55 (!) 57     Sitting      Standing   Subjective Assessment - 10/19/20 1240    Subjective Holding the Sinemet until seeing the allergist, as pt has had rash each time  he's taken Sinemet.  Had an episode of blood pressure fluctuations, but better with the med changes.  Overall moving slower, less body strength.  No falls in the past 6 months; daughter reports "bump-ups".  Daughter reports started shuffling in past several months.  Gets tired easily.    Pertinent History macular degeneration with difficulty with depth perception    Patient Stated Goals Pt's goal for therapy is to do everything he wants to do, keep strength up.    Currently in Pain? No/denies              [facility] PT Assessment - 10/19/20 1235      Assessment   Medical Diagnosis Parkinson's disease    Referring Provider (PT) [name], DO    Onset Date/Surgical Date 09/20/20    PD dx   Hand Dominance Right      Precautions   Precautions Fall;Other (comment)    Precaution Comments Low BP upon standing; being monitored, but it improving with medication changes      Balance Screen   Has the patient fallen in the past 6 months No    Has the patient had a decrease in activity level because of a fear of falling?  No    Is the patient reluctant to leave their home because of a fear of falling?  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  Walks outdoors on his driveway; when he lived at [employer], he walked and rode his bike.      Observation/Other Assessments   Focus on Therapeutic Outcomes (FOTO)  NA      AROM   Overall AROM  Within functional limits for tasks performed      Strength   Overall Strength Deficits    Overall Strength Comments Grossly tested 4/5 BLEs-hip flexors, quads, hamstrings, ankle dorsiflexion      Transfers   Transfers Sit to Stand;Stand to Sit    Sit to Stand 5: Supervision;Without upper extremity assist;From chair/3-in-1    Stand to Sit 5: Supervision;Without upper extremity assist;To chair/3-in-1      Ambulation/Gait   Ambulation/Gait Yes    Ambulation/Gait Assistance 5: Supervision    Ambulation Distance (Feet) 120 Feet    Assistive device None    Gait Pattern Step-through pattern;Decreased arm swing - right;Decreased arm swing -  left;Trunk flexed;Poor foot clearance - left;Poor foot clearance - right    Ambulation Surface Level;Indoor    Gait velocity 12.44 sec = 2.64 ft/sec      Standardized Balance Assessment   Standardized Balance Assessment Timed Up and Go Test      Timed Up and Go Test   Normal TUG (seconds) 16.56    Manual TUG (seconds) 17.94    Cognitive TUG (seconds) 23.28    TUG Comments  Scores >13.5-15 sec indicate increased fall risk; >10% difference indicates difficulty with dual tasking with gait.      High Level Balance   High Level Balance Comments Push and release test: Forward:  takes 1 step in foward direction to recover.  Posterior direction:  1 step to recover.                      Objective measurements completed on examination: See above findings.               PT Education - 10/20/20 1609    Education Details Eval results, POC    Person(s) Educated Patient;Child(ren)   daughter   Methods Explanation    Comprehension Verbalized understanding            PT Short Term Goals - 10/20/20 1818      PT SHORT TERM GOAL #1   Title Pt will perform HEP with family supervision for improved strength, balance, transfers, and gait.  TARGET 11/12/2020    Time 4    Period Weeks    Status New      PT SHORT TERM GOAL #2   Title MiniBESTest to be completed, with goal to be written as appropriate.    Time 4    Period Weeks    Status New      PT SHORT TERM GOAL #3   Title 6MWT to be assessed, with goal to be written as appropriate.    Time 4    Period Weeks    Status New      PT SHORT TERM GOAL #4   Title Pt will improve TUG score to less than or equal to 13.5 sec for decreased fall risk.    Time 4    Period Weeks    Status New      PT SHORT TERM GOAL #5   Title Pt will verbalize understanding of fall prevention in home environment.    Time 4    Period Weeks    Status New             PT Long Term Goals - 10/20/20 1821      PT LONG TERM GOAL #1    Title Pt will perform HEP with family supervision for improved strength, balance, transfers, and gait.  TARGET 12/10/20    Time 8    Period Weeks    Status New      PT LONG TERM GOAL #2   Title Pt will improve TUG/TUG cognitive score to less than or equal to 10% difference, for improved dual tasking with gait.    Time 8    Period Weeks    Status New      PT LONG TERM GOAL #3   Title Pt will improve gait velocity to at least 2.8 ft/sec for improved efficiency and safety with community gait.    Time 8    Period Weeks    Status New      PT LONG TERM GOAL #4   Title Pt will ambulate at least 1000 ft, indoor and outdoor surfaces, mod independnetly for improved community and outdoor gait safety.    Time 8    Period Weeks    Status New                  Plan - 10/20/20 1611    Clinical Impression Statement Pt is an 80 year old male who presents to OPPT with recent dx of  Parkinson's disease.  He presents with reports of decreased strength and endurance, shuffling gait and has history of orthostatice BP changes upon standing.  He presents with bradykinesia, decreased timing and coordination of gait, decreased balance, difficulty with dual tasking, abnormal posture.  He is at fall risk per TUG scores and demo difficulty with dual tasking with TUG and TUG cognitive scores.  Prior to Parkinson's diagnosis, he was active traveling and walking around property/workshop.  He will benefit from skilled PT to address the above stated deficits to improve overall functional mobility and decreased fall risk.    Personal Factors and Comorbidities Comorbidity 3+    Comorbidities PMH:  chronic back pain, DDD, fatigue, GERD, glaucoma, hx of syncope, OA,    Examination-Activity Limitations Locomotion Level;Transfers;Stairs    Examination-Participation Restrictions Community Activity    Stability/Clinical Decision Making Evolving/Moderate complexity    Clinical Decision Making Moderate    Rehab Potential  Good    PT Frequency 2x / week    PT Duration 8 weeks   including eval week   PT Treatment/Interventions ADLs/Self Care Home Management;Gait training;Stair training;Functional mobility training;Therapeutic activities;Therapeutic exercise;Balance training;Neuromuscular re-education;DME Instruction;Patient/family education    PT Next Visit Plan Complete MiniBESTest and 6MWT and write goals as appropriate.  Initiate HEP:  seated/standing PWR! Moves to address posture, arm swing, step length    Consulted and Agree with Plan of Care Patient;Family member/caregiver    Family Member Consulted daughter           Patient will benefit from skilled therapeutic intervention in order to improve the following deficits and impairments:  Abnormal gait,Difficulty walking,Decreased balance,Decreased mobility,Decreased strength,Postural dysfunction  Visit Diagnosis: Other abnormalities of gait and mobility  Unsteadiness on feet  Abnormal posture  Other symptoms and signs involving the nervous system  Muscle weakness (generalized)     Problem List Patient Active Problem List   Diagnosis Date Noted  . Seizure-like activity ([place]) 09/29/2020  . Rash 09/29/2020  . Parkinson's disease ([place]) 09/29/2020  . AKI (acute kidney injury) ([place]) 09/29/2020  . Leukocytosis 09/29/2020  . BPH with obstruction/lower urinary tract symptoms 09/23/2020  . Pain in unspecified shoulder 12/18/2019  . 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/2019  . Unspecified hemorrhoids 12/18/2019  . Renal stone 12/18/2019  . Vitamin D deficiency, unspecified 12/18/2019  . Male erectile dysfunction 12/18/2019  . Osteoarthritis of knee, unspecified 12/18/2019  . Impaired fasting glucose 12/18/2019  . Gastroesophageal reflux disease without esophagitis 12/18/2019  . Essential hypertension 12/18/2019  . Sebaceous cyst 12/18/2019  . Mixed hyperlipidemia 12/18/2019  . Fatigue 12/18/2019  . Pain in hand 12/18/2019  . Onychomycosis 12/18/2019   . Other spondylosis with radiculopathy, cervical region 12/18/2019  . BPH with urinary obstruction 12/18/2019  . Toxic effect of venom of wasps, accidental (unintentional), initial encounter 12/18/2019  . Medication management 12/18/2019  . Right flank pain 12/18/2019  . Memory loss 12/18/2019  . Family history of hemochromatosis 12/18/2019  . Pure hypercholesterolemia 12/18/2019  . Hyperglycemia 12/18/2019    Seth Carter W. 10/20/2020, 6:24 PM  Frazier Butt., PT   [facility] [address] [place], [place], [address] Phone: [phone]   Fax:  [phone]  Name: Seth Carter MRN: [id number] Date of Birth: 08/19/1940

## 2020-10-22 ENCOUNTER — Other Ambulatory Visit: Payer: Self-pay

## 2020-10-22 ENCOUNTER — Ambulatory Visit: Payer: Medicare Other | Attending: Neurology

## 2020-10-22 VITALS — BP 122/72 | HR 56

## 2020-10-22 DIAGNOSIS — R293 Abnormal posture: Secondary | ICD-10-CM | POA: Diagnosis not present

## 2020-10-22 DIAGNOSIS — R29818 Other symptoms and signs involving the nervous system: Secondary | ICD-10-CM | POA: Diagnosis not present

## 2020-10-22 DIAGNOSIS — M6281 Muscle weakness (generalized): Secondary | ICD-10-CM

## 2020-10-22 DIAGNOSIS — R2689 Other abnormalities of gait and mobility: Secondary | ICD-10-CM | POA: Diagnosis not present

## 2020-10-22 DIAGNOSIS — R2681 Unsteadiness on feet: Secondary | ICD-10-CM

## 2020-10-22 NOTE — Therapy (Signed)
[facility] [address] [place], [place], [address] Phone: [phone]   Fax:  [phone]  Physical Therapy Treatment  Patient Details  Name: Seth Carter MRN: [id number] Date of Birth: 06/16/1941 Referring Provider (PT): Alonza Bogus, DO   Encounter Date: 10/22/2020   PT End of Session - 10/22/20 1022    Visit Number 2    Number of Visits 17    Date for PT Re-Evaluation 01/18/21    Authorization Type Medicare and UHC-Follow Medicare guidelines    PT Start Time 1020    PT Stop Time 1058    PT Time Calculation (min) 38 min    Equipment Utilized During Treatment Gait belt    Activity Tolerance Patient tolerated treatment well    Behavior During Therapy WFL for tasks assessed/performed           Past Medical History:  Diagnosis Date  . Allergic rhinitis, seasonal   . B12 deficiency   . BPH with obstruction/lower urinary tract symptoms   . Bradykinesia    neruologist--- dr tat  . Chronic back pain   . Chronically dry eyes   . DDD (degenerative disc disease), lumbar   . ED (erectile dysfunction)   . Essential hypertension    nuclear test in epic 01-16-2020 normal perfusion no ischemia, normal lvf and wall motion, nuclear ef 65%  . Family history of hemochromatosis   . Fatigue   . Generalized weakness   . GERD (gastroesophageal reflux disease)   . Glaucoma, both eyes   . Hemorrhoids   . History of chronic sinusitis   . History of kidney stones   . History of syncope    cardiology evaluation by dr Marlou Porch, note in epic 12-26-2019, work-up includes event monitor 02-04-2020( showed SR with occ PAC & PVC rare PAT)/ echo  12-26-2019 (mild LVH, ef 50-55%, moderate MR, mild to moderate AV sclerosis no stenosis, ascending aorta 34mm) and nuclear test 01-16-2020 (normal perfusion w/ normal LV and wall motion, nuclear ef 65%)  . Hypogonadism in male   . Macular degeneration, right eye   . Mixed hyperlipidemia   . OA  (osteoarthritis)   . Other spondylosis with radiculopathy, cervical region   . Parkinson's disease [facility]) 08/2020   neurologist--- dr tat--  newly dx 02/ 2022  . Sinusitis, acute 08/2020   per pt started antibiotic on 09-20-2020, has cough nasal congestion, cough, no fever  . Sting of hornets, wasps, and bees causing poisoning and toxic reactions   . Vitamin D deficiency   . Wears glasses     Past Surgical History:  Procedure Laterality Date  . ANKLE SURGERY Right 10-04-2015  @[facility]    debridement achilles tendon & reattachment and heel excision osteophyte  . CATARACT EXTRACTION W/ INTRAOCULAR LENS  IMPLANT, BILATERAL  yrs ago  . CYSTOSCOPY WITH INSERTION OF UROLIFT  2019 approx.  Marland Kitchen SHOULDER SURGERY Right 1990's  . TONSILLECTOMY  age 69  . TRANSURETHRAL RESECTION OF PROSTATE N/A 09/23/2020   Procedure: TRANSURETHRAL RESECTION OF THE PROSTATE (TURP);  Surgeon: [name], MD;  Location: [facility];  Service: Urology;  Laterality: N/A;  75 MINS    Vitals:   10/22/20 1023 10/22/20 1029  BP: (!) 148/82 122/72  Pulse: (!) 56   SpO2: 99%      Subjective Assessment - 10/22/20 1022    Subjective Pt still holding on Sinmet until he sees his allergist. Denies any falls.    Pertinent History macular degeneration  with difficulty with depth perception    Patient Stated Goals Pt's goal for therapy is to do everything he wants to do, keep strength up.    Currently in Pain? No/denies              [facility] PT Assessment - 10/22/20 1028      6 Minute Walk- Baseline   6 Minute Walk- Baseline yes    BP (mmHg) 148/82    HR (bpm) 56    02 Sat (%RA) 99 %    Modified Borg Scale for Dyspnea 0- Nothing at all      6 Minute walk- Post Test   6 Minute Walk Post Test yes    BP (mmHg) 158/82    HR (bpm) 62    02 Sat (%RA) 100 %    Modified Borg Scale for Dyspnea 0- Nothing at all      6 minute walk test results    Aerobic Endurance Distance Walked 1035                          [facility] Adult PT Treatment/Exercise - 10/22/20 1028      Transfers   Transfers Sit to Stand;Stand to Sit    Sit to Stand 5: Supervision    Stand to Sit 5: Supervision      Ambulation/Gait   Ambulation/Gait Yes    Ambulation/Gait Assistance 5: Supervision    Ambulation/Gait Assistance Details ambulated during 6 min walk. Pt has decreased step length with decreased heel strike. Verbal cues to try to increase steps and relax arms for more arm swing.    Ambulation Distance (Feet) 1035 Feet    Assistive device None    Gait Pattern Step-through pattern;Decreased arm swing - right;Decreased arm swing - left;Decreased step length - right;Decreased step length - left    Ambulation Surface Level;Indoor    Gait velocity 11.62 sec=2.46ft/sec      Standardized Balance Assessment   Standardized Balance Assessment Mini-BESTest      Mini-BESTest   Sit To Stand Normal: Comes to stand without use of hands and stabilizes independently.    Rise to Toes Normal: Stable for 3 s with maximum height.    Stand on one leg (left) Moderate: < 20 s    Stand on one leg (right) Moderate: < 20 s    Stand on one leg - lowest score 1    Compensatory Stepping Correction - Forward Normal: Recovers independently with a single, large step (second realignement is allowed).    Compensatory Stepping Correction - Backward Normal: Recovers independently with a single, large step    Compensatory Stepping Correction - Left Lateral Moderate: Several steps to recover equilibrium    Compensatory Stepping Correction - Right Lateral Normal: Recovers independently with 1 step (crossover or lateral OK)    Stepping Corredtion Lateral - lowest score 1    Stance - Feet together, eyes open, firm surface  Normal: 30s    Stance - Feet together, eyes closed, foam surface  Normal: 30s    Incline - Eyes Closed Normal: Stands independently 30s and aligns with gravity    Change in Gait Speed Normal: Significantly  changes walkling speed without imbalance    Walk with head turns - Horizontal Moderate: performs head turns with reduction in gait speed.    Walk with pivot turns Normal: Turns with feet close FAST (< 3 steps) with good balance.    Step over obstacles Normal: Able to  step over box with minimal change of gait speed and with good balance.    Timed UP & GO with Dual Task Moderate: Dual Task affects either counting OR walking (>10%) when compared to the TUG without Dual Task.    Mini-BEST total score 24                  PT Education - 10/22/20 1532    Education Details Educated on results of testing    Person(s) Educated Patient    Methods Explanation    Comprehension Verbalized understanding            PT Short Term Goals - 10/22/20 1534      PT SHORT TERM GOAL #1   Title Pt will perform HEP with family supervision for improved strength, balance, transfers, and gait.  TARGET 11/12/2020    Time 4    Period Weeks    Status New      PT SHORT TERM GOAL #2   Title MiniBESTest to be completed, with goal to be written as appropriate.    Baseline Mini-Best 24/28 on 10/22/20. SLS goal written in LTG    Time 4    Period Weeks    Status Achieved      PT SHORT TERM GOAL #3   Title 6MWT to be assessed, with goal to be written as appropriate.    Baseline 6MWT performed completing 1035' on 10/22/20. LTG written    Time 4    Period Weeks    Status Achieved      PT SHORT TERM GOAL #4   Title Pt will improve TUG score to less than or equal to 13.5 sec for decreased fall risk.    Time 4    Period Weeks    Status New      PT SHORT TERM GOAL #5   Title Pt will verbalize understanding of fall prevention in home environment.    Time 4    Period Weeks    Status New             PT Long Term Goals - 10/22/20 1537      PT LONG TERM GOAL #1   Title Pt will perform HEP with family supervision for improved strength, balance, transfers, and gait.  TARGET 12/10/20    Time 8    Period  Weeks    Status New      PT LONG TERM GOAL #2   Title Pt will improve TUG/TUG cognitive score to less than or equal to 10% difference, for improved dual tasking with gait.    Time 8    Period Weeks    Status New      PT LONG TERM GOAL #3   Title Pt will improve gait velocity to at least 2.8 ft/sec for improved efficiency and safety with community gait.    Time 8    Period Weeks    Status New      PT LONG TERM GOAL #4   Title Pt will ambulate at least 1000 ft, indoor and outdoor surfaces, mod independnetly for improved community and outdoor gait safety.    Time 8    Period Weeks    Status New      PT LONG TERM GOAL #5   Title Pt will increase SLS time to >20 sec each leg for improved balance.    Baseline <20 sec when performed in mini-Best    Time 8    Period Weeks  Status New      Additional Long Term Goals   Additional Long Term Goals Yes      PT LONG TERM GOAL #6   Title Pt will increase 6 min walk distance to >1235' for improved community mobility.    Baseline 10/22/20 1035'    Time 8    Period Weeks    Status New                 Plan - 10/22/20 1541    Clinical Impression Statement PT performed mini-Best and 6 min walk testing today. Pt score well on mini-Best and was most challenged on SLS and with the dual task on TUG. Added SLS goal to work towards. During 6 min walk pt was noted to have decreased step length and arm swing. LTG added.    Personal Factors and Comorbidities Comorbidity 3+    Comorbidities PMH:  chronic back pain, DDD, fatigue, GERD, glaucoma, hx of syncope, OA,    Examination-Activity Limitations Locomotion Level;Transfers;Stairs    Examination-Participation Restrictions Community Activity    Stability/Clinical Decision Making Evolving/Moderate complexity    Rehab Potential Good    PT Frequency 2x / week    PT Duration 8 weeks   including eval week   PT Treatment/Interventions ADLs/Self Care Home Management;Gait training;Stair  training;Functional mobility training;Therapeutic activities;Therapeutic exercise;Balance training;Neuromuscular re-education;DME Instruction;Patient/family education    PT Next Visit Plan Initiate HEP:  seated/standing PWR! Moves to address posture, arm swing, step length    Consulted and Agree with Plan of Care Patient;Family member/caregiver    Family Member Consulted daughter           Patient will benefit from skilled therapeutic intervention in order to improve the following deficits and impairments:  Abnormal gait,Difficulty walking,Decreased balance,Decreased mobility,Decreased strength,Postural dysfunction  Visit Diagnosis: Other abnormalities of gait and mobility  Muscle weakness (generalized)  Unsteadiness on feet  Abnormal posture     Problem List Patient Active Problem List   Diagnosis Date Noted  . Seizure-like activity ([place]) 09/29/2020  . Rash 09/29/2020  . Parkinson's disease ([place]) 09/29/2020  . AKI (acute kidney injury) ([place]) 09/29/2020  . Leukocytosis 09/29/2020  . BPH with obstruction/lower urinary tract symptoms 09/23/2020  . Pain in unspecified shoulder 12/18/2019  . 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/2019  . Unspecified hemorrhoids 12/18/2019  . Renal stone 12/18/2019  . Vitamin D deficiency, unspecified 12/18/2019  . Male erectile dysfunction 12/18/2019  . Osteoarthritis of knee, unspecified 12/18/2019  . Impaired fasting glucose 12/18/2019  . Gastroesophageal reflux disease without esophagitis 12/18/2019  . Essential hypertension 12/18/2019  . Sebaceous cyst 12/18/2019  . Mixed hyperlipidemia 12/18/2019  . Fatigue 12/18/2019  . Pain in hand 12/18/2019  . Onychomycosis 12/18/2019  . Other spondylosis with radiculopathy, cervical region 12/18/2019  . BPH with urinary obstruction 12/18/2019  . Toxic effect of venom of wasps, accidental (unintentional), initial encounter 12/18/2019  . Medication management 12/18/2019  . Right flank pain 12/18/2019  .  Memory loss 12/18/2019  . Family history of hemochromatosis 12/18/2019  . Pure hypercholesterolemia 12/18/2019  . Hyperglycemia 12/18/2019    Electa Sniff, PT, DPT, NCS 10/22/2020, 3:44 PM  [facility] [address] [place], [place], [address] Phone: [phone]   Fax:  [phone]  Name: Seth Carter MRN: [id number] Date of Birth: August 02, 1940

## 2020-10-25 ENCOUNTER — Ambulatory Visit: Payer: Medicare Other | Admitting: Physical Therapy

## 2020-10-27 ENCOUNTER — Ambulatory Visit: Payer: Medicare Other

## 2020-10-27 ENCOUNTER — Other Ambulatory Visit: Payer: Self-pay

## 2020-10-27 DIAGNOSIS — M6281 Muscle weakness (generalized): Secondary | ICD-10-CM

## 2020-10-27 DIAGNOSIS — R2681 Unsteadiness on feet: Secondary | ICD-10-CM

## 2020-10-27 DIAGNOSIS — R293 Abnormal posture: Secondary | ICD-10-CM

## 2020-10-27 DIAGNOSIS — R2689 Other abnormalities of gait and mobility: Secondary | ICD-10-CM

## 2020-10-27 DIAGNOSIS — R29818 Other symptoms and signs involving the nervous system: Secondary | ICD-10-CM

## 2020-10-27 NOTE — Therapy (Signed)
[facility] [address] [place], [place], [address] Phone: [phone]   Fax:  [phone]  Physical Therapy Treatment  Patient Details  Name: Seth Carter MRN: [id number] Date of Birth: Jun 09, 1941 Referring Provider (PT): Alonza Bogus, DO   Encounter Date: 10/27/2020   PT End of Session - 10/27/20 1106    Visit Number 3    Number of Visits 17    Date for PT Re-Evaluation 01/18/21    Authorization Type Medicare and UHC-Follow Medicare guidelines    PT Start Time 1100    PT Stop Time 1145    PT Time Calculation (min) 45 min    Equipment Utilized During Treatment Gait belt    Activity Tolerance Patient tolerated treatment well    Behavior During Therapy [facility] for tasks assessed/performed           Past Medical History:  Diagnosis Date  . Allergic rhinitis, seasonal   . B12 deficiency   . BPH with obstruction/lower urinary tract symptoms   . Bradykinesia    neruologist--- dr tat  . Chronic back pain   . Chronically dry eyes   . DDD (degenerative disc disease), lumbar   . ED (erectile dysfunction)   . Essential hypertension    nuclear test in epic 01-16-2020 normal perfusion no ischemia, normal lvf and wall motion, nuclear ef 65%  . Family history of hemochromatosis   . Fatigue   . Generalized weakness   . GERD (gastroesophageal reflux disease)   . Glaucoma, both eyes   . Hemorrhoids   . History of chronic sinusitis   . History of kidney stones   . History of syncope    cardiology evaluation by dr Marlou Porch, note in epic 12-26-2019, work-up includes event monitor 02-04-2020( showed SR with occ PAC & PVC rare PAT)/ echo  12-26-2019 (mild LVH, ef 50-55%, moderate MR, mild to moderate AV sclerosis no stenosis, ascending aorta 17mm) and nuclear test 01-16-2020 (normal perfusion w/ normal LV and wall motion, nuclear ef 65%)  . Hypogonadism in male   . Macular degeneration, right eye   . Mixed hyperlipidemia   . OA  (osteoarthritis)   . Other spondylosis with radiculopathy, cervical region   . Parkinson's disease [facility]) 08/2020   neurologist--- dr tat--  newly dx 02/ 2022  . Sinusitis, acute 08/2020   per pt started antibiotic on 09-20-2020, has cough nasal congestion, cough, no fever  . Sting of hornets, wasps, and bees causing poisoning and toxic reactions   . Vitamin D deficiency   . Wears glasses     Past Surgical History:  Procedure Laterality Date  . ANKLE SURGERY Right 10-04-2015  @[facility]    debridement achilles tendon & reattachment and heel excision osteophyte  . CATARACT EXTRACTION W/ INTRAOCULAR LENS  IMPLANT, BILATERAL  yrs ago  . CYSTOSCOPY WITH INSERTION OF UROLIFT  2019 approx.  Marland Kitchen SHOULDER SURGERY Right 1990's  . TONSILLECTOMY  age 56  . TRANSURETHRAL RESECTION OF PROSTATE N/A 09/23/2020   Procedure: TRANSURETHRAL RESECTION OF THE PROSTATE (TURP);  Surgeon: [name], MD;  Location: [facility];  Service: Urology;  Laterality: N/A;  75 MINS    There were no vitals filed for this visit.   Subjective Assessment - 10/27/20 1110    Subjective Pt reports nothing new to report. Denies falls or near falls.    Pertinent History macular degeneration with difficulty with depth perception    Patient Stated Goals Pt's goal for therapy is to  do everything he wants to do, keep strength up.    Currently in Pain? No/denies                 Treatment: Sci fit bike: manual resistance 8 for 6' Sit to stand: 15lbs 2 x 10 Deadlift: 25lbs from 6" box: 2 x 5 Negative heel raises: from bottom step: 2 x 10 Side steps: green band: 2 x 10 R and L Monster walks: green band: 2 x 10 Fwd and bwd  Resisted walking Fwd and bwd with black sport cord: 2 x 5 each way                   PT Education - 10/27/20 1148    Education Details Access Code: [email or web address]  URL: [email or web address]  Date: 10/27/2020  Prepared by: Markus Jarvis    Exercises  Sit to  Stand Without Arm Support - 1 x daily - 7 x weekly - 2 sets - 10 reps  Standing Bilateral Heel Raise on Step - 1 x daily - 7 x weekly - 2 sets - 10 reps  Side Stepping with Resistance at Ankles - 1 x daily - 7 x weekly - 2 sets - 10 reps  Forward Monster Walks - 1 x daily - 7 x weekly - 2 sets - 10 reps  Backward Monster Walks - 1 x daily - 7 x weekly - 2 sets - 10 reps            PT Short Term Goals - 10/22/20 1534      PT SHORT TERM GOAL #1   Title Pt will perform HEP with family supervision for improved strength, balance, transfers, and gait.  TARGET 11/12/2020    Time 4    Period Weeks    Status New      PT SHORT TERM GOAL #2   Title MiniBESTest to be completed, with goal to be written as appropriate.    Baseline Mini-Best 24/28 on 10/22/20. SLS goal written in LTG    Time 4    Period Weeks    Status Achieved      PT SHORT TERM GOAL #3   Title 6MWT to be assessed, with goal to be written as appropriate.    Baseline 6MWT performed completing 1035' on 10/22/20. LTG written    Time 4    Period Weeks    Status Achieved      PT SHORT TERM GOAL #4   Title Pt will improve TUG score to less than or equal to 13.5 sec for decreased fall risk.    Time 4    Period Weeks    Status New      PT SHORT TERM GOAL #5   Title Pt will verbalize understanding of fall prevention in home environment.    Time 4    Period Weeks    Status New             PT Long Term Goals - 10/22/20 1537      PT LONG TERM GOAL #1   Title Pt will perform HEP with family supervision for improved strength, balance, transfers, and gait.  TARGET 12/10/20    Time 8    Period Weeks    Status New      PT LONG TERM GOAL #2   Title Pt will improve TUG/TUG cognitive score to less than or equal to 10% difference, for improved dual tasking with gait.    Time 8  Period Weeks    Status New      PT LONG TERM GOAL #3   Title Pt will improve gait velocity to at least 2.8 ft/sec for improved efficiency and safety with  community gait.    Time 8    Period Weeks    Status New      PT LONG TERM GOAL #4   Title Pt will ambulate at least 1000 ft, indoor and outdoor surfaces, mod independnetly for improved community and outdoor gait safety.    Time 8    Period Weeks    Status New      PT LONG TERM GOAL #5   Title Pt will increase SLS time to >20 sec each leg for improved balance.    Baseline <20 sec when performed in mini-Best    Time 8    Period Weeks    Status New      Additional Long Term Goals   Additional Long Term Goals Yes      PT LONG TERM GOAL #6   Title Pt will increase 6 min walk distance to >1235' for improved community mobility.    Baseline 10/22/20 1035'    Time 8    Period Weeks    Status New                 Plan - 10/27/20 1110    Clinical Impression Statement Pt tolerated session well. We focused on issue general strengthening HEP today. Pt had difficulty walking backwards. It was verbally emphasized and writtent in HEP that pt holds onto countertop when performing resisted walking with band around ankles.    Personal Factors and Comorbidities Comorbidity 3+    Comorbidities PMH:  chronic back pain, DDD, fatigue, GERD, glaucoma, hx of syncope, OA,    Examination-Activity Limitations Locomotion Level;Transfers;Stairs    Examination-Participation Restrictions Community Activity    Stability/Clinical Decision Making Evolving/Moderate complexity    Rehab Potential Good    PT Frequency 2x / week    PT Duration 8 weeks   including eval week   PT Treatment/Interventions ADLs/Self Care Home Management;Gait training;Stair training;Functional mobility training;Therapeutic activities;Therapeutic exercise;Balance training;Neuromuscular re-education;DME Instruction;Patient/family education    PT Next Visit Plan Initiate HEP:  seated/standing PWR! Moves to address posture, arm swing, step length    PT Home Exercise Plan Access Code: [email or web address]  URL: [email or web address]   Date: 10/27/2020  Prepared by: Markus Jarvis    Exercises  Sit to Stand Without Arm Support - 1 x daily - 7 x weekly - 2 sets - 10 reps  Standing Bilateral Heel Raise on Step - 1 x daily - 7 x weekly - 2 sets - 10 reps  Side Stepping with Resistance at Ankles - 1 x daily - 7 x weekly - 2 sets - 10 reps  Forward Monster Walks - 1 x daily - 7 x weekly - 2 sets - 10 reps  Backward Monster Walks - 1 x daily - 7 x weekly - 2 sets - 10 reps    Consulted and Agree with Plan of Care Patient;Family member/caregiver    Family Member Consulted daughter           Patient will benefit from skilled therapeutic intervention in order to improve the following deficits and impairments:  Abnormal gait,Difficulty walking,Decreased balance,Decreased mobility,Decreased strength,Postural dysfunction  Visit Diagnosis: Other abnormalities of gait and mobility  Muscle weakness (generalized)  Unsteadiness on feet  Abnormal posture  Other symptoms and signs involving the nervous system  Problem List Patient Active Problem List   Diagnosis Date Noted  . Seizure-like activity ([place]) 09/29/2020  . Rash 09/29/2020  . Parkinson's disease ([place]) 09/29/2020  . AKI (acute kidney injury) ([place]) 09/29/2020  . Leukocytosis 09/29/2020  . BPH with obstruction/lower urinary tract symptoms 09/23/2020  . Pain in unspecified shoulder 12/18/2019  . 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/2019  . Unspecified hemorrhoids 12/18/2019  . Renal stone 12/18/2019  . Vitamin D deficiency, unspecified 12/18/2019  . Male erectile dysfunction 12/18/2019  . Osteoarthritis of knee, unspecified 12/18/2019  . Impaired fasting glucose 12/18/2019  . Gastroesophageal reflux disease without esophagitis 12/18/2019  . Essential hypertension 12/18/2019  . Sebaceous cyst 12/18/2019  . Mixed hyperlipidemia 12/18/2019  . Fatigue 12/18/2019  . Pain in hand 12/18/2019  . Onychomycosis 12/18/2019  . Other spondylosis with radiculopathy, cervical region  12/18/2019  . BPH with urinary obstruction 12/18/2019  . Toxic effect of venom of wasps, accidental (unintentional), initial encounter 12/18/2019  . Medication management 12/18/2019  . Right flank pain 12/18/2019  . Memory loss 12/18/2019  . Family history of hemochromatosis 12/18/2019  . Pure hypercholesterolemia 12/18/2019  . Hyperglycemia 12/18/2019    Kerrie Pleasure, PT 10/27/2020, 11:58 AM  [facility] [address] [place], [place], [address] Phone: [phone]   Fax:  [phone]  Name: Seth Carter MRN: [id number] Date of Birth: 12/30/1940

## 2020-10-29 ENCOUNTER — Other Ambulatory Visit: Payer: Self-pay

## 2020-10-29 ENCOUNTER — Ambulatory Visit: Payer: Medicare Other | Admitting: Physical Therapy

## 2020-10-29 ENCOUNTER — Ambulatory Visit (INDEPENDENT_AMBULATORY_CARE_PROVIDER_SITE_OTHER): Payer: Medicare Other | Admitting: Allergy and Immunology

## 2020-10-29 ENCOUNTER — Telehealth: Payer: Self-pay | Admitting: Neurology

## 2020-10-29 ENCOUNTER — Encounter: Payer: Self-pay | Admitting: Allergy and Immunology

## 2020-10-29 ENCOUNTER — Ambulatory Visit: Payer: Medicare Other

## 2020-10-29 VITALS — BP 150/78 | HR 60 | Resp 16 | Ht 70.4 in | Wt 195.2 lb

## 2020-10-29 DIAGNOSIS — R293 Abnormal posture: Secondary | ICD-10-CM

## 2020-10-29 DIAGNOSIS — M6281 Muscle weakness (generalized): Secondary | ICD-10-CM

## 2020-10-29 DIAGNOSIS — R29818 Other symptoms and signs involving the nervous system: Secondary | ICD-10-CM | POA: Diagnosis not present

## 2020-10-29 DIAGNOSIS — T50905D Adverse effect of unspecified drugs, medicaments and biological substances, subsequent encounter: Secondary | ICD-10-CM | POA: Diagnosis not present

## 2020-10-29 DIAGNOSIS — R2681 Unsteadiness on feet: Secondary | ICD-10-CM

## 2020-10-29 DIAGNOSIS — R2689 Other abnormalities of gait and mobility: Secondary | ICD-10-CM | POA: Diagnosis not present

## 2020-10-29 MED ORDER — FAMOTIDINE 40 MG PO TABS: ORAL_TABLET | ORAL | 1 refills | Status: DC

## 2020-10-29 NOTE — Patient Instructions (Addendum)
  1. Continue Zyrtec 10 mg - 1 tablet 1 time per day  2. Start Famotidine 40 mg - 1 tablet 1 time per day  3. Prednisone 10 mg tablets located at home (#10)  4. On Monday - restart slow escalation of Sinemet with Dr. Carles Collet schedule  5. Call clinic if problem  6. Contrast reaction???

## 2020-10-29 NOTE — Telephone Encounter (Signed)
Got note from pts allergist and they put him on some allergy medication and they would like him to restart carbidopa/levodopa 25/100 to see if he develops rash.  Have him retitrate:  Start Carbidopa Levodopa as follows:  Take 1/2 tablet three times daily, at least 30 minutes before meals (approximately 7am/11am/4pm), for one week  Then take 1/2 tablet in the morning, 1/2 tablet in the afternoon, 1 tablet in the evening, at least 30 minutes before meals, for one week  Then take 1/2 tablet in the morning, 1 tablet in the afternoon, 1 tablet in the evening, at least 30 minutes before meals, for one week  Then take 1 tablet three times daily at 7am/11am/4pm, at least 30 minutes before meals   As a reminder, carbidopa/levodopa can be taken at the same time as a carbohydrate, but we like to have you take your pill either 30 minutes before a protein source or 1 hour after as protein can interfere with carbidopa/levodopa absorption.

## 2020-10-29 NOTE — Progress Notes (Signed)
[place] - [place] - [place] - [place] - [place]   Dear Tat,  Thank you for referring Seth Carter to the [facility] on 10/29/2020.   Below is a summation of this patient's evaluation and recommendations.  Thank you for your referral. I will keep you informed about this patient's response to treatment.   If you have any questions please do not hesitate to contact me.   Sincerely,  Jiles Prows, MD Allergy / Immunology [facility]   ______________________________________________________________________    NEW PATIENT NOTE  Referring Provider: Ludwig Clarks, DO Primary Provider: [name], MD Date of office visit: 10/29/2020    Subjective:   Chief Complaint:  Seth Carter (DOB: [birth year]) is a 80 y.o. male who presents to the clinic on 10/29/2020 with a chief complaint of Medication Reaction   HPI: Seth Carter presents to this clinic in evaluation of possible allergic reaction directed against Sinemet.  His history dates back to the very beginning of March.  Apparently he had a TURP performed on 23 September 2020.  He was given an antibiotic for 5 days after that surgery.  On 25 September 2020 he started Sinemet for Parkinson's.  On 27 September 2020 he apparently developed abdominal and back pain and he had a CT scan with contrast administered.  On 28 September 2020 he ended up in the emergency room with a diffuse erythematous dermatitis with whelps for which he was treated with epinephrine and Benadryl and systemic steroids.  After about 24 hours of therapy, which included addition of famotidine to his daily cetirizine, and administration of systemic steroids, he did quite well and was discharged.  On 04 October 2020 he restarted his Sinemet and he may developed some redness of his skin and he did not continue on this medication.  In addition, he apparently had a "frozen" event involving motor  function and cognitive function on 28 September 2020 in conjunction with the redness of his skin that required his emergency room evaluation.  Past Medical History:  Diagnosis Date  . Allergic rhinitis, seasonal   . B12 deficiency   . BPH with obstruction/lower urinary tract symptoms   . Bradykinesia    neruologist--- dr tat  . Chronic back pain   . Chronically dry eyes   . DDD (degenerative disc disease), lumbar   . ED (erectile dysfunction)   . Essential hypertension    nuclear test in epic 01-16-2020 normal perfusion no ischemia, normal lvf and wall motion, nuclear ef 65%  . Family history of hemochromatosis   . Fatigue   . Generalized weakness   . GERD (gastroesophageal reflux disease)   . Glaucoma, both eyes   . Hemorrhoids   . History of chronic sinusitis   . History of kidney stones   . History of syncope    cardiology evaluation by dr Marlou Porch, note in epic 12-26-2019, work-up includes event monitor 02-04-2020( showed SR with occ PAC & PVC rare PAT)/ echo  12-26-2019 (mild LVH, ef 50-55%, moderate MR, mild to moderate AV sclerosis no stenosis, ascending aorta 22mm) and nuclear test 01-16-2020 (normal perfusion w/ normal LV and wall motion, nuclear ef 65%)  . Hypogonadism in male   . Macular degeneration, right eye   . Mixed hyperlipidemia   . OA (osteoarthritis)   . Other spondylosis with radiculopathy, cervical region   . Parkinson's disease ([place]) 08/2020   neurologist--- dr tat--  newly dx 02/ 2022  . Sinusitis, acute 08/2020   per pt started antibiotic on 09-20-2020, has cough nasal congestion, cough, no fever  . Sting of hornets, wasps, and bees causing poisoning and toxic reactions   . Vitamin D deficiency   . Wears glasses     Past Surgical History:  Procedure Laterality Date  . ANKLE SURGERY Right 10-04-2015  @[facility]    debridement achilles tendon & reattachment and heel excision osteophyte  . CATARACT EXTRACTION W/ INTRAOCULAR LENS  IMPLANT, BILATERAL  yrs ago  .  CYSTOSCOPY WITH INSERTION OF UROLIFT  2019 approx.  Marland Kitchen SHOULDER SURGERY Right 1990's  . TONSILLECTOMY  age 80  . TRANSURETHRAL RESECTION OF PROSTATE N/A 09/23/2020   Procedure: TRANSURETHRAL RESECTION OF THE PROSTATE (TURP);  Surgeon: [name], MD;  Location: [facility];  Service: Urology;  Laterality: N/A;  75 MINS    Allergies as of 10/29/2020      Reactions   Cialis [tadalafil] Other (See Comments)   Severe back pain   Milk-related Compounds Diarrhea, Other (See Comments)   Upsets the stomach   Pneumovax [pneumococcal Polysaccharide Vaccine] Swelling, Other (See Comments)   Arm swelling and pain   Lac Bovis Diarrhea, Other (See Comments)   Upsets the stomach   Simvastatin Other (See Comments)   Nightmares   Ace Inhibitors Cough   Contrast Media [iodinated Diagnostic Agents] Itching, Rash, Other (See Comments)   POSSIBLE delayed reaction = all-over body rash and seizures that began after this was used   Sinemet [carbidopa W-levodopa] Itching, Rash, Other (See Comments)   POSSIBLE delayed reaction = all-over body rash and seizures that began after starting this      Medication List    amLODipine 5 MG tablet Commonly known as: NORVASC Take 5 mg by mouth daily.   aspirin EC 81 MG tablet Take 81 mg by mouth at bedtime. Swallow whole.   Besivance 0.6 % Susp Generic drug: Besifloxacin HCl Place 1 drop into the right eye See admin instructions. Instill 1 drop into the right eye three times a a day for for 3 days after the monthly shot   cetirizine 10 MG tablet Commonly known as: ZYRTEC Take 10 mg by mouth daily.   cholecalciferol 25 MCG (1000 UNIT) tablet Commonly known as: VITAMIN D3 Take 1,000 Units by mouth daily.   Co Q-10 100 MG Caps Take 100 mg by mouth daily.   docusate sodium 100 MG capsule Commonly known as: COLACE Take 1 capsule (100 mg total) by mouth every 12 (twelve) hours.   dorzolamide-timolol 22.3-6.8 MG/ML ophthalmic  solution Commonly known as: COSOPT Place 1 drop into both eyes 2 (two) times daily.   famotidine 40 MG tablet Commonly known as: PEPCID Take one tablet by mouth once daily as directed.   latanoprost 0.005 % ophthalmic solution Commonly known as: XALATAN Place 1 drop into both eyes at bedtime.   multivitamin with minerals Tabs tablet Take 1 tablet by mouth daily.   prednisoLONE acetate 1 % ophthalmic suspension Commonly known as: PRED FORTE Place 1 drop into both eyes 4 (four) times daily.   PreserVision/Lutein Caps Take 1 capsule by mouth daily.   triamcinolone 55 MCG/ACT Aero nasal inhaler Commonly known as: NASACORT Place 2 sprays into the nose daily as needed (for allergies).   valsartan 80 MG tablet Commonly known as: DIOVAN Take 80 mg by mouth daily.   vitamin B-12 1000 MCG tablet Commonly known as: CYANOCOBALAMIN Take 1,000 mcg by mouth daily.  Review of systems negative except as noted in HPI / PMHx or noted below:  Review of Systems  Constitutional: Negative.   HENT: Negative.   Eyes: Negative.   Respiratory: Negative.   Cardiovascular: Negative.   Gastrointestinal: Negative.   Genitourinary: Negative.   Musculoskeletal: Negative.   Skin: Negative.   Neurological: Negative.   Endo/Heme/Allergies: Negative.   Psychiatric/Behavioral: Negative.     Family History  Problem Relation Age of Onset  . Cancer Mother   . Heart attack Father   . Melanoma Father   . Alzheimer's disease Sister   . Heart disease Sister   . Other Sister        Polio  . Thyroid disease Daughter   . Hemachromatosis Daughter   . High blood pressure Daughter   . Diabetes Daughter     Social History   Socioeconomic History  . Marital status: Married    Spouse name: Not on file  . Number of children: Not on file  . Years of education: Not on file  . Highest education level: Not on file  Occupational History  . Not on file  Tobacco Use  . Smoking status: Never  Smoker  . Smokeless tobacco: Never Used  Vaping Use  . Vaping Use: Never used  Substance and Sexual Activity  . Alcohol use: Yes    Alcohol/week: 5.0 - 6.0 standard drinks    Types: 5 - 6 Standard drinks or equivalent per week  . Drug use: Never  . Sexual activity: Not on file  Other Topics Concern  . Not on file  Social History Narrative   Right handed   Lives in a two story home    Drinks caffeine    Environmental and Social history  Lives in a house with a dry environment, dog look inside the household, hardwood in the bedroom, plastic on the bed, plastic on the pillow, no smoking ongoing with inside the household.  Objective:   Vitals:   10/29/20 1456  BP: (!) 150/78  Pulse: 60  Resp: 16  SpO2: 98%   Height: 5' 10.4" (178.8 cm) Weight: 195 lb 3.2 oz (88.5 kg)  Physical Exam Constitutional:      Appearance: He is not diaphoretic.  HENT:     Head: Normocephalic.     Right Ear: Tympanic membrane, ear canal and external ear normal.     Left Ear: Tympanic membrane, ear canal and external ear normal.     Nose: Nose normal. No mucosal edema or rhinorrhea.     Mouth/Throat:     Pharynx: Uvula midline. No oropharyngeal exudate.  Eyes:     Conjunctiva/sclera: Conjunctivae normal.  Neck:     Thyroid: No thyromegaly.     Trachea: Trachea normal. No tracheal tenderness or tracheal deviation.  Cardiovascular:     Rate and Rhythm: Normal rate and regular rhythm.     Heart sounds: Normal heart sounds, S1 normal and S2 normal. No murmur heard.   Pulmonary:     Effort: No respiratory distress.     Breath sounds: Normal breath sounds. No stridor. No wheezing or rales.  Lymphadenopathy:     Head:     Right side of head: No tonsillar adenopathy.     Left side of head: No tonsillar adenopathy.     Cervical: No cervical adenopathy.  Skin:    Findings: No erythema or rash.     Nails: There is no clubbing.  Neurological:     Mental Status: He is  alert.     Diagnostics:  None  Assessment and Plan:    1. Adverse effect of drug, subsequent encounter     1. Continue Zyrtec 10 mg - 1 tablet 1 time per day  2. Start Famotidine 40 mg - 1 tablet 1 time per day  3. Prednisone 10 mg tablets located at home (#10)  4. On Monday - restart slow escalation of Sinemet with Dr. Carles Collet schedule  5. Call clinic if problem  6. Contrast reaction???  This is a very confusing story and there are several possible triggers that could have given rise to his transient episode of apparent short-lived urticaria.  This could have been a result of antibiotics administered around the time of his TURP, exposure to Sinemet, exposure to radiocontrast media, or some unidentified trigger.  I think the best way to work through this issue have him use a few days of a combination H1 and H2 receptor blocker and then restart his Sinemet and if he does well over the course of 2 weeks then he can continue on Sinemet as prescribed by his neurologist.  If he does develop a reaction his family will contact me immediately and we will start him on prednisone and we will then probably need to work through which of the 2 agents contained within Sinemet is the inciting agent.  If he does well on Sinemet then we need to work through the issue of possible radiocontrast media reaction.  That will be a much more difficult issue and there is no diagnostic test available to determine if he does have a radiocontrast media sensitivity other than to challenge him with radiocontrast media utilizing a preventative plan that contains systemic steroids administered the day before and the day of this administration.  In addition, he developed this "frozen" event which could have been a different type of pharmacological response to Sinemet and that will also become evident as he goes through his dose escalation of this medication over the course of the next 2 weeks.  Jiles Prows, MD Allergy / Immunology [facility]

## 2020-10-29 NOTE — Therapy (Signed)
[facility] [address] [place], [place], [address] Phone: [phone]   Fax:  [phone]  Physical Therapy Treatment  Patient Details  Name: Seth Carter MRN: [id number] Date of Birth: 1941-06-27 Referring Provider (PT): Alonza Bogus, DO   Encounter Date: 10/29/2020   PT End of Session - 10/29/20 0936    Visit Number 4    Number of Visits 17    Date for PT Re-Evaluation 01/18/21    Authorization Type Medicare and UHC-Follow Medicare guidelines    PT Start Time 0930    PT Stop Time 1015    PT Time Calculation (min) 45 min    Equipment Utilized During Treatment Gait belt    Activity Tolerance Patient tolerated treatment well    Behavior During Therapy [facility] for tasks assessed/performed           Past Medical History:  Diagnosis Date  . Allergic rhinitis, seasonal   . B12 deficiency   . BPH with obstruction/lower urinary tract symptoms   . Bradykinesia    neruologist--- dr tat  . Chronic back pain   . Chronically dry eyes   . DDD (degenerative disc disease), lumbar   . ED (erectile dysfunction)   . Essential hypertension    nuclear test in epic 01-16-2020 normal perfusion no ischemia, normal lvf and wall motion, nuclear ef 65%  . Family history of hemochromatosis   . Fatigue   . Generalized weakness   . GERD (gastroesophageal reflux disease)   . Glaucoma, both eyes   . Hemorrhoids   . History of chronic sinusitis   . History of kidney stones   . History of syncope    cardiology evaluation by dr Marlou Porch, note in epic 12-26-2019, work-up includes event monitor 02-04-2020( showed SR with occ PAC & PVC rare PAT)/ echo  12-26-2019 (mild LVH, ef 50-55%, moderate MR, mild to moderate AV sclerosis no stenosis, ascending aorta 73mm) and nuclear test 01-16-2020 (normal perfusion w/ normal LV and wall motion, nuclear ef 65%)  . Hypogonadism in male   . Macular degeneration, right eye   . Mixed hyperlipidemia   . OA  (osteoarthritis)   . Other spondylosis with radiculopathy, cervical region   . Parkinson's disease [facility]) 08/2020   neurologist--- dr tat--  newly dx 02/ 2022  . Sinusitis, acute 08/2020   per pt started antibiotic on 09-20-2020, has cough nasal congestion, cough, no fever  . Sting of hornets, wasps, and bees causing poisoning and toxic reactions   . Vitamin D deficiency   . Wears glasses     Past Surgical History:  Procedure Laterality Date  . ANKLE SURGERY Right 10-04-2015  @[facility]    debridement achilles tendon & reattachment and heel excision osteophyte  . CATARACT EXTRACTION W/ INTRAOCULAR LENS  IMPLANT, BILATERAL  yrs ago  . CYSTOSCOPY WITH INSERTION OF UROLIFT  2019 approx.  Marland Kitchen SHOULDER SURGERY Right 1990's  . TONSILLECTOMY  age 24  . TRANSURETHRAL RESECTION OF PROSTATE N/A 09/23/2020   Procedure: TRANSURETHRAL RESECTION OF THE PROSTATE (TURP);  Surgeon: [name], MD;  Location: [facility];  Service: Urology;  Laterality: N/A;  75 MINS    There were no vitals filed for this visit.   Subjective Assessment - 10/29/20 0936    Subjective Pt reports I was little sore after last time. Weights weren't as easy to pick up at home as they were here.    Pertinent History macular degeneration with difficulty with depth perception  Patient Stated Goals Pt's goal for therapy is to do everything he wants to do, keep strength up.                 Scitfit: manual level 10 for 6' Sit to stand from 14" box: 15 lbs: 2 x 10 Soccer ball taps alternating: 20x R and L SLS with contralateral foot on soccer ball and pt performs small circles with ball: 3 x 10 R and L Ladder drills:  - one step 2x fwd, 1 bwd - alternating steps: 2x - in-in-out-out: 2x each way (going laterally) - in-in-out-out: 2x fwd and bwd (going up and down ladder) 1/2 foam roll heel raises: 2 x 10, cues for maximum height                    PT Education - 10/29/20 0940     Education Details Pt was educated that he doesn't have to pick up heavy weights at home everyday and he needs to let his muscle recovery.    Person(s) Educated Patient    Methods Explanation    Comprehension Verbalized understanding            PT Short Term Goals - 10/22/20 1534      PT SHORT TERM GOAL #1   Title Pt will perform HEP with family supervision for improved strength, balance, transfers, and gait.  TARGET 11/12/2020    Time 4    Period Weeks    Status New      PT SHORT TERM GOAL #2   Title MiniBESTest to be completed, with goal to be written as appropriate.    Baseline Mini-Best 24/28 on 10/22/20. SLS goal written in LTG    Time 4    Period Weeks    Status Achieved      PT SHORT TERM GOAL #3   Title 6MWT to be assessed, with goal to be written as appropriate.    Baseline 6MWT performed completing 1035' on 10/22/20. LTG written    Time 4    Period Weeks    Status Achieved      PT SHORT TERM GOAL #4   Title Pt will improve TUG score to less than or equal to 13.5 sec for decreased fall risk.    Time 4    Period Weeks    Status New      PT SHORT TERM GOAL #5   Title Pt will verbalize understanding of fall prevention in home environment.    Time 4    Period Weeks    Status New             PT Long Term Goals - 10/22/20 1537      PT LONG TERM GOAL #1   Title Pt will perform HEP with family supervision for improved strength, balance, transfers, and gait.  TARGET 12/10/20    Time 8    Period Weeks    Status New      PT LONG TERM GOAL #2   Title Pt will improve TUG/TUG cognitive score to less than or equal to 10% difference, for improved dual tasking with gait.    Time 8    Period Weeks    Status New      PT LONG TERM GOAL #3   Title Pt will improve gait velocity to at least 2.8 ft/sec for improved efficiency and safety with community gait.    Time 8    Period Weeks    Status New  PT LONG TERM GOAL #4   Title Pt will ambulate at least 1000 ft,  indoor and outdoor surfaces, mod independnetly for improved community and outdoor gait safety.    Time 8    Period Weeks    Status New      PT LONG TERM GOAL #5   Title Pt will increase SLS time to >20 sec each leg for improved balance.    Baseline <20 sec when performed in mini-Best    Time 8    Period Weeks    Status New      Additional Long Term Goals   Additional Long Term Goals Yes      PT LONG TERM GOAL #6   Title Pt will increase 6 min walk distance to >1235' for improved community mobility.    Baseline 10/22/20 1035'    Time 8    Period Weeks    Status New                 Plan - 10/29/20 1011    Clinical Impression Statement Pt tolerated progression well. Step ladder drills were added for dual tasking and agility purpose today. pt demo milld difficulty with in-in-out-out.    Personal Factors and Comorbidities Comorbidity 3+    Comorbidities PMH:  chronic back pain, DDD, fatigue, GERD, glaucoma, hx of syncope, OA,    Examination-Activity Limitations Locomotion Level;Transfers;Stairs    Examination-Participation Restrictions Community Activity    Stability/Clinical Decision Making Evolving/Moderate complexity    Rehab Potential Good    PT Frequency 2x / week    PT Duration 8 weeks   including eval week   PT Treatment/Interventions ADLs/Self Care Home Management;Gait training;Stair training;Functional mobility training;Therapeutic activities;Therapeutic exercise;Balance training;Neuromuscular re-education;DME Instruction;Patient/family education    PT Next Visit Plan Initiate HEP:  seated/standing PWR! Moves to address posture, arm swing, step length    PT Home Exercise Plan Access Code: [email or web address]  URL: [email or web address]  Date: 10/27/2020  Prepared by: Markus Jarvis    Exercises  Sit to Stand Without Arm Support - 1 x daily - 7 x weekly - 2 sets - 10 reps  Standing Bilateral Heel Raise on Step - 1 x daily - 7 x weekly - 2 sets - 10 reps  Side Stepping  with Resistance at Ankles - 1 x daily - 7 x weekly - 2 sets - 10 reps  Forward Monster Walks - 1 x daily - 7 x weekly - 2 sets - 10 reps  Backward Monster Walks - 1 x daily - 7 x weekly - 2 sets - 10 reps    Consulted and Agree with Plan of Care Patient;Family member/caregiver    Family Member Consulted daughter           Patient will benefit from skilled therapeutic intervention in order to improve the following deficits and impairments:  Abnormal gait,Difficulty walking,Decreased balance,Decreased mobility,Decreased strength,Postural dysfunction  Visit Diagnosis: Other abnormalities of gait and mobility  Muscle weakness (generalized)  Unsteadiness on feet  Abnormal posture     Problem List Patient Active Problem List   Diagnosis Date Noted  . Seizure-like activity ([place]) 09/29/2020  . Rash 09/29/2020  . Parkinson's disease ([place]) 09/29/2020  . AKI (acute kidney injury) ([place]) 09/29/2020  . Leukocytosis 09/29/2020  . BPH with obstruction/lower urinary tract symptoms 09/23/2020  . Pain in unspecified shoulder 12/18/2019  . 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/2019  . Unspecified hemorrhoids 12/18/2019  . Renal stone 12/18/2019  . Vitamin D deficiency, unspecified 12/18/2019  . Male  erectile dysfunction 12/18/2019  . Osteoarthritis of knee, unspecified 12/18/2019  . Impaired fasting glucose 12/18/2019  . Gastroesophageal reflux disease without esophagitis 12/18/2019  . Essential hypertension 12/18/2019  . Sebaceous cyst 12/18/2019  . Mixed hyperlipidemia 12/18/2019  . Fatigue 12/18/2019  . Pain in hand 12/18/2019  . Onychomycosis 12/18/2019  . Other spondylosis with radiculopathy, cervical region 12/18/2019  . BPH with urinary obstruction 12/18/2019  . Toxic effect of venom of wasps, accidental (unintentional), initial encounter 12/18/2019  . Medication management 12/18/2019  . Right flank pain 12/18/2019  . Memory loss 12/18/2019  . Family history of hemochromatosis  12/18/2019  . Pure hypercholesterolemia 12/18/2019  . Hyperglycemia 12/18/2019    Kerrie Pleasure, PT 10/29/2020, 10:22 AM  [facility] [address] [place], [place], [address] Phone: [phone]   Fax:  [phone]  Name: Seth Carter MRN: [id number] Date of Birth: Nov 11, 1940

## 2020-11-01 ENCOUNTER — Encounter (INDEPENDENT_AMBULATORY_CARE_PROVIDER_SITE_OTHER): Payer: Medicare Other | Admitting: Ophthalmology

## 2020-11-01 ENCOUNTER — Other Ambulatory Visit: Payer: Self-pay

## 2020-11-01 DIAGNOSIS — H34832 Tributary (branch) retinal vein occlusion, left eye, with macular edema: Secondary | ICD-10-CM | POA: Diagnosis not present

## 2020-11-01 DIAGNOSIS — H35033 Hypertensive retinopathy, bilateral: Secondary | ICD-10-CM

## 2020-11-01 DIAGNOSIS — H353132 Nonexudative age-related macular degeneration, bilateral, intermediate dry stage: Secondary | ICD-10-CM | POA: Diagnosis not present

## 2020-11-01 DIAGNOSIS — I10 Essential (primary) hypertension: Secondary | ICD-10-CM | POA: Diagnosis not present

## 2020-11-01 DIAGNOSIS — H33302 Unspecified retinal break, left eye: Secondary | ICD-10-CM | POA: Diagnosis not present

## 2020-11-01 DIAGNOSIS — H43813 Vitreous degeneration, bilateral: Secondary | ICD-10-CM

## 2020-11-01 DIAGNOSIS — D3131 Benign neoplasm of right choroid: Secondary | ICD-10-CM | POA: Diagnosis not present

## 2020-11-01 NOTE — Telephone Encounter (Signed)
Left message for patient to contact office.

## 2020-11-02 ENCOUNTER — Ambulatory Visit: Payer: Medicare Other | Admitting: Physical Therapy

## 2020-11-02 ENCOUNTER — Encounter: Payer: Self-pay | Admitting: Physical Therapy

## 2020-11-02 DIAGNOSIS — R293 Abnormal posture: Secondary | ICD-10-CM

## 2020-11-02 DIAGNOSIS — R2681 Unsteadiness on feet: Secondary | ICD-10-CM | POA: Diagnosis not present

## 2020-11-02 DIAGNOSIS — R2689 Other abnormalities of gait and mobility: Secondary | ICD-10-CM | POA: Diagnosis not present

## 2020-11-02 DIAGNOSIS — R29818 Other symptoms and signs involving the nervous system: Secondary | ICD-10-CM | POA: Diagnosis not present

## 2020-11-02 DIAGNOSIS — M6281 Muscle weakness (generalized): Secondary | ICD-10-CM

## 2020-11-02 NOTE — Telephone Encounter (Signed)
Left message for patient to contact office.

## 2020-11-02 NOTE — Therapy (Signed)
[facility] [address] [place], [place], [address] Phone: [phone]   Fax:  [phone]  Physical Therapy Treatment  Patient Details  Name: Seth Carter MRN: [id number] Date of Birth: 09-Jan-1941 Referring Provider (PT): Alonza Bogus, DO   Encounter Date: 11/02/2020   PT End of Session - 11/02/20 1426    Visit Number 5    Number of Visits 17    Date for PT Re-Evaluation 01/18/21    Authorization Type Medicare and UHC-Follow Medicare guidelines    PT Start Time 1319    PT Stop Time 1400    PT Time Calculation (min) 41 min    Equipment Utilized During Treatment Gait belt    Activity Tolerance Patient tolerated treatment well    Behavior During Therapy [facility] for tasks assessed/performed           Past Medical History:  Diagnosis Date  . Allergic rhinitis, seasonal   . B12 deficiency   . BPH with obstruction/lower urinary tract symptoms   . Bradykinesia    neruologist--- dr tat  . Chronic back pain   . Chronically dry eyes   . DDD (degenerative disc disease), lumbar   . ED (erectile dysfunction)   . Essential hypertension    nuclear test in epic 01-16-2020 normal perfusion no ischemia, normal lvf and wall motion, nuclear ef 65%  . Family history of hemochromatosis   . Fatigue   . Generalized weakness   . GERD (gastroesophageal reflux disease)   . Glaucoma, both eyes   . Hemorrhoids   . History of chronic sinusitis   . History of kidney stones   . History of syncope    cardiology evaluation by dr Marlou Porch, note in epic 12-26-2019, work-up includes event monitor 02-04-2020( showed SR with occ PAC & PVC rare PAT)/ echo  12-26-2019 (mild LVH, ef 50-55%, moderate MR, mild to moderate AV sclerosis no stenosis, ascending aorta 35mm) and nuclear test 01-16-2020 (normal perfusion w/ normal LV and wall motion, nuclear ef 65%)  . Hypogonadism in male   . Macular degeneration, right eye   . Mixed hyperlipidemia   . OA  (osteoarthritis)   . Other spondylosis with radiculopathy, cervical region   . Parkinson's disease [facility]) 08/2020   neurologist--- dr tat--  newly dx 02/ 2022  . Sinusitis, acute 08/2020   per pt started antibiotic on 09-20-2020, has cough nasal congestion, cough, no fever  . Sting of hornets, wasps, and bees causing poisoning and toxic reactions   . Vitamin D deficiency   . Wears glasses     Past Surgical History:  Procedure Laterality Date  . ANKLE SURGERY Right 10-04-2015  @[facility]    debridement achilles tendon & reattachment and heel excision osteophyte  . CATARACT EXTRACTION W/ INTRAOCULAR LENS  IMPLANT, BILATERAL  yrs ago  . CYSTOSCOPY WITH INSERTION OF UROLIFT  2019 approx.  Marland Kitchen SHOULDER SURGERY Right 1990's  . TONSILLECTOMY  age 9  . TRANSURETHRAL RESECTION OF PROSTATE N/A 09/23/2020   Procedure: TRANSURETHRAL RESECTION OF THE PROSTATE (TURP);  Surgeon: [name], MD;  Location: [facility];  Service: Urology;  Laterality: N/A;  75 MINS    There were no vitals filed for this visit.   Subjective Assessment - 11/02/20 1321    Subjective Started the Sinemet after seeing the allergist. No issues with it. Thinks he overdid it over the weekend.    Pertinent History macular degeneration with difficulty with depth perception    Patient Stated  Goals Pt's goal for therapy is to do everything he wants to do, keep strength up.    Currently in Pain? No/denies                             [facility] Adult PT Treatment/Exercise - 11/02/20 0001      Ambulation/Gait   Ambulation/Gait Yes    Ambulation/Gait Assistance 5: Supervision    Ambulation/Gait Assistance Details therapist utilized walking poles initially to help facilitate B arm swing, then removed poles and cued pt for incr step length B, pt at times getting off sequence with arm swing when trying to coordinate, did better with improved arm swing when focusing on incr step length (pt with improved  heel strike as well)    Ambulation Distance (Feet) 700 Feet    Assistive device None    Gait Pattern Step-through pattern;Decreased arm swing - right;Decreased arm swing - left;Decreased step length - right;Decreased step length - left    Ambulation Surface Level;Indoor               Pt performs PWR! Moves in sitting position:  Explained rationale and purpose of each exercise in regards to PD. Also educated on purpose of intensity of movement when performing and focusing on larger movement patterns. Pt rating effort level as a 7/10 throughout.    PWR! Up for improved posture 2 x  10 reps - cues for scap retraction and performing with bigger hands.   PWR! Rock for improved weighshifting x5 reps B coming down towards thigh and cues to look up at hand, then performed an additional x5 reps B with addition of kicking the leg out.   PWR! Twist for improved trunk rotation 3 x 5 reps B, needing cues to reset in the middle before twisting to the opposite side and to use legs to help pivot while twisting.   PWR! Step for improved step initiation 2 x 5 reps B, cues for incr ROM when stepping and sequencing - step out out and step in in. Pt initially with difficulty sequencing stepping back into midline with R side.   Cues provided for technique and intensity of movement.       PT Education - 11/02/20 1425    Education Details seated PWR moves.    Person(s) Educated Patient    Methods Explanation;Handout    Comprehension Verbalized understanding;Returned demonstration            PT Short Term Goals - 10/22/20 1534      PT SHORT TERM GOAL #1   Title Pt will perform HEP with family supervision for improved strength, balance, transfers, and gait.  TARGET 11/12/2020    Time 4    Period Weeks    Status New      PT SHORT TERM GOAL #2   Title MiniBESTest to be completed, with goal to be written as appropriate.    Baseline Mini-Best 24/28 on 10/22/20. SLS goal written in LTG    Time 4     Period Weeks    Status Achieved      PT SHORT TERM GOAL #3   Title 6MWT to be assessed, with goal to be written as appropriate.    Baseline 6MWT performed completing 1035' on 10/22/20. LTG written    Time 4    Period Weeks    Status Achieved      PT SHORT TERM GOAL #4   Title Pt will improve  TUG score to less than or equal to 13.5 sec for decreased fall risk.    Time 4    Period Weeks    Status New      PT SHORT TERM GOAL #5   Title Pt will verbalize understanding of fall prevention in home environment.    Time 4    Period Weeks    Status New             PT Long Term Goals - 10/22/20 1537      PT LONG TERM GOAL #1   Title Pt will perform HEP with family supervision for improved strength, balance, transfers, and gait.  TARGET 12/10/20    Time 8    Period Weeks    Status New      PT LONG TERM GOAL #2   Title Pt will improve TUG/TUG cognitive score to less than or equal to 10% difference, for improved dual tasking with gait.    Time 8    Period Weeks    Status New      PT LONG TERM GOAL #3   Title Pt will improve gait velocity to at least 2.8 ft/sec for improved efficiency and safety with community gait.    Time 8    Period Weeks    Status New      PT LONG TERM GOAL #4   Title Pt will ambulate at least 1000 ft, indoor and outdoor surfaces, mod independnetly for improved community and outdoor gait safety.    Time 8    Period Weeks    Status New      PT LONG TERM GOAL #5   Title Pt will increase SLS time to >20 sec each leg for improved balance.    Baseline <20 sec when performed in mini-Best    Time 8    Period Weeks    Status New      Additional Long Term Goals   Additional Long Term Goals Yes      PT LONG TERM GOAL #6   Title Pt will increase 6 min walk distance to >1235' for improved community mobility.    Baseline 10/22/20 1035'    Time 8    Period Weeks    Status New                 Plan - 11/02/20 1429    Clinical Impression Statement  Today's skilled session focused on adding seated PWR moves to pt's HEP. Pt initially with incr difficulty with PWR Twist and sequencing stepping with PWR Step with RLE, did improve with incr reps. Also practiced gait training with arm poles with pt demonstrating improved step length and more reciprocal arm swing afterwards.    Personal Factors and Comorbidities Comorbidity 3+    Comorbidities PMH:  chronic back pain, DDD, fatigue, GERD, glaucoma, hx of syncope, OA,    Examination-Activity Limitations Locomotion Level;Transfers;Stairs    Examination-Participation Restrictions Community Activity    Stability/Clinical Decision Making Evolving/Moderate complexity    Rehab Potential Good    PT Frequency 2x / week    PT Duration 8 weeks   including eval week   PT Treatment/Interventions ADLs/Self Care Home Management;Gait training;Stair training;Functional mobility training;Therapeutic activities;Therapeutic exercise;Balance training;Neuromuscular re-education;DME Instruction;Patient/family education    PT Next Visit Plan review seated PWR moves, initiate standing when appropriate. SciFit, gait training for arm swing/step length.    PT Home Exercise Plan seated PWR moves. Access Code: [email or web address]    [email or web address] and Agree with  Plan of Care Patient;Family member/caregiver    Family Member Consulted daughter           Patient will benefit from skilled therapeutic intervention in order to improve the following deficits and impairments:  Abnormal gait,Difficulty walking,Decreased balance,Decreased mobility,Decreased strength,Postural dysfunction  Visit Diagnosis: Other abnormalities of gait and mobility  Muscle weakness (generalized)  Unsteadiness on feet  Abnormal posture     Problem List Patient Active Problem List   Diagnosis Date Noted  . Seizure-like activity ([place]) 09/29/2020  . Rash 09/29/2020  . Parkinson's disease ([place]) 09/29/2020  . AKI (acute kidney injury) ([place]) 09/29/2020  .  Leukocytosis 09/29/2020  . BPH with obstruction/lower urinary tract symptoms 09/23/2020  . Pain in unspecified shoulder 12/18/2019  . 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/2019  . Unspecified hemorrhoids 12/18/2019  . Renal stone 12/18/2019  . Vitamin D deficiency, unspecified 12/18/2019  . Male erectile dysfunction 12/18/2019  . Osteoarthritis of knee, unspecified 12/18/2019  . Impaired fasting glucose 12/18/2019  . Gastroesophageal reflux disease without esophagitis 12/18/2019  . Essential hypertension 12/18/2019  . Sebaceous cyst 12/18/2019  . Mixed hyperlipidemia 12/18/2019  . Fatigue 12/18/2019  . Pain in hand 12/18/2019  . Onychomycosis 12/18/2019  . Other spondylosis with radiculopathy, cervical region 12/18/2019  . BPH with urinary obstruction 12/18/2019  . Toxic effect of venom of wasps, accidental (unintentional), initial encounter 12/18/2019  . Medication management 12/18/2019  . Right flank pain 12/18/2019  . Memory loss 12/18/2019  . Family history of hemochromatosis 12/18/2019  . Pure hypercholesterolemia 12/18/2019  . Hyperglycemia 12/18/2019    Arliss Journey, PT, DPT  11/02/2020, 2:31 PM  [facility] [address] [place], [place], [address] Phone: [phone]   Fax:  [phone]  Name: STONE SPIRITO MRN: [id number] Date of Birth: 11-25-1940

## 2020-11-03 ENCOUNTER — Telehealth: Payer: Self-pay | Admitting: Neurology

## 2020-11-03 NOTE — Telephone Encounter (Signed)
Patient and daughter called in returning Tee's call

## 2020-11-03 NOTE — Telephone Encounter (Signed)
Spoke with patient and gave him and his wife Dr Doristine Devoid recommendations (from telephone note 10/29/20). They voiced understanding. Advised patient to contact office if the rash develops again. He voiced understanding.

## 2020-11-09 ENCOUNTER — Ambulatory Visit: Payer: Medicare Other | Admitting: Physical Therapy

## 2020-11-09 DIAGNOSIS — R3915 Urgency of urination: Secondary | ICD-10-CM | POA: Diagnosis not present

## 2020-11-09 DIAGNOSIS — N201 Calculus of ureter: Secondary | ICD-10-CM | POA: Diagnosis not present

## 2020-11-09 DIAGNOSIS — R350 Frequency of micturition: Secondary | ICD-10-CM | POA: Diagnosis not present

## 2020-11-12 ENCOUNTER — Encounter: Payer: Self-pay | Admitting: Physical Therapy

## 2020-11-12 ENCOUNTER — Ambulatory Visit: Payer: Medicare Other | Admitting: Physical Therapy

## 2020-11-12 ENCOUNTER — Other Ambulatory Visit: Payer: Self-pay

## 2020-11-12 DIAGNOSIS — R293 Abnormal posture: Secondary | ICD-10-CM | POA: Diagnosis not present

## 2020-11-12 DIAGNOSIS — R2689 Other abnormalities of gait and mobility: Secondary | ICD-10-CM

## 2020-11-12 DIAGNOSIS — R2681 Unsteadiness on feet: Secondary | ICD-10-CM | POA: Diagnosis not present

## 2020-11-12 DIAGNOSIS — M6281 Muscle weakness (generalized): Secondary | ICD-10-CM | POA: Diagnosis not present

## 2020-11-12 DIAGNOSIS — R29818 Other symptoms and signs involving the nervous system: Secondary | ICD-10-CM | POA: Diagnosis not present

## 2020-11-12 NOTE — Therapy (Signed)
[facility] [address] [place], [place], [address] Phone: [phone]   Fax:  [phone]  Physical Therapy Treatment  Patient Details  Name: Seth Carter MRN: [id number] Date of Birth: 1941/07/07 Referring Provider (PT): Alonza Bogus, DO   Encounter Date: 11/12/2020   PT End of Session - 11/12/20 1058    Visit Number 6    Number of Visits 17    Date for PT Re-Evaluation 01/18/21    Authorization Type Medicare and UHC-Follow Medicare guidelines    Progress Note Due on Visit 10    PT Start Time 1018    PT Stop Time 1059    PT Time Calculation (min) 41 min    Equipment Utilized During Treatment --    Activity Tolerance Patient tolerated treatment well    Behavior During Therapy [facility] for tasks assessed/performed           Past Medical History:  Diagnosis Date  . Allergic rhinitis, seasonal   . B12 deficiency   . BPH with obstruction/lower urinary tract symptoms   . Bradykinesia    neruologist--- dr tat  . Chronic back pain   . Chronically dry eyes   . DDD (degenerative disc disease), lumbar   . ED (erectile dysfunction)   . Essential hypertension    nuclear test in epic 01-16-2020 normal perfusion no ischemia, normal lvf and wall motion, nuclear ef 65%  . Family history of hemochromatosis   . Fatigue   . Generalized weakness   . GERD (gastroesophageal reflux disease)   . Glaucoma, both eyes   . Hemorrhoids   . History of chronic sinusitis   . History of kidney stones   . History of syncope    cardiology evaluation by dr Marlou Porch, note in epic 12-26-2019, work-up includes event monitor 02-04-2020( showed SR with occ PAC & PVC rare PAT)/ echo  12-26-2019 (mild LVH, ef 50-55%, moderate MR, mild to moderate AV sclerosis no stenosis, ascending aorta 42mm) and nuclear test 01-16-2020 (normal perfusion w/ normal LV and wall motion, nuclear ef 65%)  . Hypogonadism in male   . Macular degeneration, right eye   .  Mixed hyperlipidemia   . OA (osteoarthritis)   . Other spondylosis with radiculopathy, cervical region   . Parkinson's disease [facility]) 08/2020   neurologist--- dr tat--  newly dx 02/ 2022  . Sinusitis, acute 08/2020   per pt started antibiotic on 09-20-2020, has cough nasal congestion, cough, no fever  . Sting of hornets, wasps, and bees causing poisoning and toxic reactions   . Vitamin D deficiency   . Wears glasses     Past Surgical History:  Procedure Laterality Date  . ANKLE SURGERY Right 10-04-2015  @[facility]    debridement achilles tendon & reattachment and heel excision osteophyte  . CATARACT EXTRACTION W/ INTRAOCULAR LENS  IMPLANT, BILATERAL  yrs ago  . CYSTOSCOPY WITH INSERTION OF UROLIFT  2019 approx.  Marland Kitchen SHOULDER SURGERY Right 1990's  . TONSILLECTOMY  age 69  . TRANSURETHRAL RESECTION OF PROSTATE N/A 09/23/2020   Procedure: TRANSURETHRAL RESECTION OF THE PROSTATE (TURP);  Surgeon: [name], MD;  Location: [facility];  Service: Urology;  Laterality: N/A;  75 MINS    There were no vitals filed for this visit.   Subjective Assessment - 11/12/20 1021    Subjective Missed the appointment earlier this week due to getting worked up for possible kidney stones.  No other changes.  This is the end of the second  week with one pill Sinemet dose.    Pertinent History macular degeneration with difficulty with depth perception    Patient Stated Goals Pt's goal for therapy is to do everything he wants to do, keep strength up.    Currently in Pain? No/denies                             [facility] Adult PT Treatment/Exercise - 11/12/20 0001      Ambulation/Gait   Ambulation/Gait Yes    Ambulation/Gait Assistance 5: Supervision    Ambulation/Gait Assistance Details Initially used bilateral walking poles, cues for increased step length and reciprocal arm swing.  When removed, pt able to initiate maintain reciprocal arm swing, then reverts to same-sided arm  swing.  Tactile cues for relaxed UEs through shoulders; verbal cues for keeping increased step length.    Ambulation Distance (Feet) 630 Feet    Assistive device None    Gait Pattern Step-through pattern;Decreased arm swing - right;Decreased arm swing - left;Decreased step length - right;Decreased step length - left    Ambulation Surface Level;Indoor;Outdoor;Grass    Gait Comments Additional gait outdoor surfaces, including hill behind back door of clinic, ascending and descending less steep grade of the hill, cues for posture and for foot clearance.           Neuro Re-education: Pt performs PWR! Moves in sitting position:  Explained rationale and purpose of each exercise in regards to PD.    PWR! Up for improved posture, 5 reps slowly, then additional 10 reps - cues for scap retraction and performing with bigger hands.   PWR! Rock for improved weighshifting x 10 reps Bilat coming down towards thigh and cues to look up at hand, then performed an additional x5 reps B with addition of kicking the leg out.   PWR! Twist for improved trunk rotation, initial 3 reps, then 2 x 5 reps B, needing cues to reset in the middle before twisting to the opposite side and to use legs to help pivot while twisting.   PWR! Step for improved step initiation 2 x 5 reps B, cues for incr ROM when stepping and sequencing - single step out and in, from 16 in chair.  Cues provided for technique and intensity of movement.    Pt performs PWR! Moves in standing position    PWR! Up for improved posture x 10 reps  PWR! Rock for improved weighshifting x 10 reps  PWR! Twist for improved trunk rotation x 10 reps-cues to use foot to pivot for clapping hands, cues for widened open in middle between trunk rotation reps  PWR! Step for improved step initiation x 10 reps-cues for high/wide step length, difficulty coordination UE movements with stepping  Verbal, visual, tactile cues provided for technique and  intensity            PT Education - 11/12/20 1659    Education Details Provided seated PWR! Moves handout, as pt states he lost his    Person(s) Educated Patient    Methods Explanation;Demonstration;Verbal cues;Handout;Tactile cues    Comprehension Verbalized understanding;Returned demonstration;Verbal cues required;Tactile cues required            PT Short Term Goals - 10/22/20 1534      PT SHORT TERM GOAL #1   Title Pt will perform HEP with family supervision for improved strength, balance, transfers, and gait.  TARGET 11/12/2020    Time 4    Period [place]  Status New      PT SHORT TERM GOAL #2   Title MiniBESTest to be completed, with goal to be written as appropriate.    Baseline Mini-Best 24/28 on 10/22/20. SLS goal written in LTG    Time 4    Period Weeks    Status Achieved      PT SHORT TERM GOAL #3   Title 6MWT to be assessed, with goal to be written as appropriate.    Baseline 6MWT performed completing 1035' on 10/22/20. LTG written    Time 4    Period Weeks    Status Achieved      PT SHORT TERM GOAL #4   Title Pt will improve TUG score to less than or equal to 13.5 sec for decreased fall risk.    Time 4    Period Weeks    Status New      PT SHORT TERM GOAL #5   Title Pt will verbalize understanding of fall prevention in home environment.    Time 4    Period Weeks    Status New             PT Long Term Goals - 10/22/20 1537      PT LONG TERM GOAL #1   Title Pt will perform HEP with family supervision for improved strength, balance, transfers, and gait.  TARGET 12/10/20    Time 8    Period Weeks    Status New      PT LONG TERM GOAL #2   Title Pt will improve TUG/TUG cognitive score to less than or equal to 10% difference, for improved dual tasking with gait.    Time 8    Period Weeks    Status New      PT LONG TERM GOAL #3   Title Pt will improve gait velocity to at least 2.8 ft/sec for improved efficiency and safety with community gait.     Time 8    Period Weeks    Status New      PT LONG TERM GOAL #4   Title Pt will ambulate at least 1000 ft, indoor and outdoor surfaces, mod independnetly for improved community and outdoor gait safety.    Time 8    Period Weeks    Status New      PT LONG TERM GOAL #5   Title Pt will increase SLS time to >20 sec each leg for improved balance.    Baseline <20 sec when performed in mini-Best    Time 8    Period Weeks    Status New      Additional Long Term Goals   Additional Long Term Goals Yes      PT LONG TERM GOAL #6   Title Pt will increase 6 min walk distance to >1235' for improved community mobility.    Baseline 10/22/20 1035'    Time 8    Period Weeks    Status New                 Plan - 11/12/20 1700    Clinical Impression Statement Reviewed PWR! Moves in sitting and provided additional handout, as pt reports losing his.  Pt needs visual and verbal cues for reminders of correct technique and intensity.  Pt also began with standing PWR! Moves in session today and will continue to benefit from skilled PT to address balance, gait and overall improved functional mobility.    Personal Factors and Comorbidities Comorbidity 3+  Comorbidities PMH:  chronic back pain, DDD, fatigue, GERD, glaucoma, hx of syncope, OA,    Examination-Activity Limitations Locomotion Level;Transfers;Stairs    Examination-Participation Restrictions Community Activity    Stability/Clinical Decision Making Evolving/Moderate complexity    Rehab Potential Good    PT Frequency 2x / week    PT Duration 8 weeks   including eval week   PT Treatment/Interventions ADLs/Self Care Home Management;Gait training;Stair training;Functional mobility training;Therapeutic activities;Therapeutic exercise;Balance training;Neuromuscular re-education;DME Instruction;Patient/family education    PT Next Visit Plan review seated PWR moves, trial standing PWR! Moves when appropriate. SciFit, gait training for arm  swing/step length. *Chloe:  pt/family is wanting recommendations for home recumbent bike; also will need to check STGs next week and likely add more appts if pt agreeable (he is scheduled with Juliann Pulse 2nd visit next week-could he get on me or you?)    PT Home Exercise Plan seated PWR moves. Access Code: [email or web address]    Consulted and Agree with Plan of Care Patient    Family Member Consulted daughter           Patient will benefit from skilled therapeutic intervention in order to improve the following deficits and impairments:  Abnormal gait,Difficulty walking,Decreased balance,Decreased mobility,Decreased strength,Postural dysfunction  Visit Diagnosis: Other abnormalities of gait and mobility  Unsteadiness on feet     Problem List Patient Active Problem List   Diagnosis Date Noted  . Seizure-like activity ([place]) 09/29/2020  . Rash 09/29/2020  . Parkinson's disease ([place]) 09/29/2020  . AKI (acute kidney injury) ([place]) 09/29/2020  . Leukocytosis 09/29/2020  . BPH with obstruction/lower urinary tract symptoms 09/23/2020  . Pain in unspecified shoulder 12/18/2019  . 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/2019  . Unspecified hemorrhoids 12/18/2019  . Renal stone 12/18/2019  . Vitamin D deficiency, unspecified 12/18/2019  . Male erectile dysfunction 12/18/2019  . Osteoarthritis of knee, unspecified 12/18/2019  . Impaired fasting glucose 12/18/2019  . Gastroesophageal reflux disease without esophagitis 12/18/2019  . Essential hypertension 12/18/2019  . Sebaceous cyst 12/18/2019  . Mixed hyperlipidemia 12/18/2019  . Fatigue 12/18/2019  . Pain in hand 12/18/2019  . Onychomycosis 12/18/2019  . Other spondylosis with radiculopathy, cervical region 12/18/2019  . BPH with urinary obstruction 12/18/2019  . Toxic effect of venom of wasps, accidental (unintentional), initial encounter 12/18/2019  . Medication management 12/18/2019  . Right flank pain 12/18/2019  . Memory loss 12/18/2019  . Family  history of hemochromatosis 12/18/2019  . Pure hypercholesterolemia 12/18/2019  . Hyperglycemia 12/18/2019    Temekia Caskey W. 11/12/2020, 5:05 PM  Frazier Butt., PT   [facility] [address] [place], [place], [address] Phone: [phone]   Fax:  [phone]  Name: Seth Carter MRN: [id number] Date of Birth: May 05, 1941

## 2020-11-15 ENCOUNTER — Telehealth: Payer: Self-pay | Admitting: Allergy and Immunology

## 2020-11-15 NOTE — Telephone Encounter (Signed)
Please have Stoney discontinue his famotidine for a week and then he can discontinue his Zyrtec unless he needs that medication for allergies.  Let us know what happens.

## 2020-11-15 NOTE — Telephone Encounter (Signed)
Pt states he completed his meds as directed and has had no symptoms or issues.

## 2020-11-16 ENCOUNTER — Other Ambulatory Visit: Payer: Self-pay

## 2020-11-16 ENCOUNTER — Encounter: Payer: Self-pay | Admitting: Physical Therapy

## 2020-11-16 ENCOUNTER — Ambulatory Visit: Payer: Medicare Other | Admitting: Physical Therapy

## 2020-11-16 DIAGNOSIS — R3915 Urgency of urination: Secondary | ICD-10-CM | POA: Diagnosis not present

## 2020-11-16 DIAGNOSIS — M6281 Muscle weakness (generalized): Secondary | ICD-10-CM | POA: Diagnosis not present

## 2020-11-16 DIAGNOSIS — R2689 Other abnormalities of gait and mobility: Secondary | ICD-10-CM

## 2020-11-16 DIAGNOSIS — R293 Abnormal posture: Secondary | ICD-10-CM

## 2020-11-16 DIAGNOSIS — R29818 Other symptoms and signs involving the nervous system: Secondary | ICD-10-CM | POA: Diagnosis not present

## 2020-11-16 DIAGNOSIS — R2681 Unsteadiness on feet: Secondary | ICD-10-CM

## 2020-11-16 DIAGNOSIS — R350 Frequency of micturition: Secondary | ICD-10-CM | POA: Diagnosis not present

## 2020-11-16 NOTE — Therapy (Signed)
[facility] [address] [place], [place], [address] Phone: [phone]   Fax:  [phone]  Physical Therapy Treatment  Patient Details  Name: Seth Carter MRN: [id number] Date of Birth: 02-20-1941 Referring Provider (PT): Alonza Bogus, DO   Encounter Date: 11/16/2020   PT End of Session - 11/16/20 1214    Visit Number 7    Number of Visits 17    Date for PT Re-Evaluation 01/18/21    Authorization Type Medicare and UHC-Follow Medicare guidelines    Progress Note Due on Visit 10    PT Start Time 1020    PT Stop Time 1105    PT Time Calculation (min) 45 min    Activity Tolerance Patient tolerated treatment well    Behavior During Therapy [facility] for tasks assessed/performed           Past Medical History:  Diagnosis Date  . Allergic rhinitis, seasonal   . B12 deficiency   . BPH with obstruction/lower urinary tract symptoms   . Bradykinesia    neruologist--- dr tat  . Chronic back pain   . Chronically dry eyes   . DDD (degenerative disc disease), lumbar   . ED (erectile dysfunction)   . Essential hypertension    nuclear test in epic 01-16-2020 normal perfusion no ischemia, normal lvf and wall motion, nuclear ef 65%  . Family history of hemochromatosis   . Fatigue   . Generalized weakness   . GERD (gastroesophageal reflux disease)   . Glaucoma, both eyes   . Hemorrhoids   . History of chronic sinusitis   . History of kidney stones   . History of syncope    cardiology evaluation by dr Marlou Porch, note in epic 12-26-2019, work-up includes event monitor 02-04-2020( showed SR with occ PAC & PVC rare PAT)/ echo  12-26-2019 (mild LVH, ef 50-55%, moderate MR, mild to moderate AV sclerosis no stenosis, ascending aorta 57m) and nuclear test 01-16-2020 (normal perfusion w/ normal LV and wall motion, nuclear ef 65%)  . Hypogonadism in male   . Macular degeneration, right eye   . Mixed hyperlipidemia   . OA (osteoarthritis)    . Other spondylosis with radiculopathy, cervical region   . Parkinson's disease ([facility] 08/2020   neurologist--- dr tat--  newly dx 02/ 2022  . Sinusitis, acute 08/2020   per pt started antibiotic on 09-20-2020, has cough nasal congestion, cough, no fever  . Sting of hornets, wasps, and bees causing poisoning and toxic reactions   . Vitamin D deficiency   . Wears glasses     Past Surgical History:  Procedure Laterality Date  . ANKLE SURGERY Right 10-04-2015  _0    debridement achilles tendon & reattachment and heel excision osteophyte  . CATARACT EXTRACTION W/ INTRAOCULAR LENS  IMPLANT, BILATERAL  yrs ago  . CYSTOSCOPY WITH INSERTION OF UROLIFT  2019 approx.  .Marland KitchenSHOULDER SURGERY Right 1990's  . TONSILLECTOMY  age 89 . TRANSURETHRAL RESECTION OF PROSTATE N/A 09/23/2020   Procedure: TRANSURETHRAL RESECTION OF THE PROSTATE (TURP);  Surgeon: DFranchot Gallo MD;  [facility]  Service: Urology;  Laterality: N/A;  75 MINS    There were no vitals filed for this visit.   Subjective Assessment - 11/16/20 1022    Subjective Did some heavy lifting and it didn't feel good on the shoulder. Not painful now. Have been doing some of the PWR moves.    Pertinent History macular degeneration with difficulty with depth perception  Patient Stated Goals Pt's goal for therapy is to do everything he wants to do, keep strength up.    Currently in Pain? No/denies              [facility] PT Assessment - 11/16/20 1048      Timed Up and Go Test   Normal TUG (seconds) 9                         [facility] Adult PT Treatment/Exercise - 11/16/20 1044      Ambulation/Gait   Ambulation/Gait Yes    Ambulation/Gait Assistance 5: Supervision    Ambulation/Gait Assistance Details cues for focusing on incr step length between activities, pt able to improve step length but with decr B arm swing. when pt tries to coordinate arm swing, it ends up being on the same side     Assistive device None    Gait Pattern Step-through pattern;Decreased arm swing - right;Decreased arm swing - left;Decreased step length - right;Decreased step length - left    Ambulation Surface Level;Indoor      Therapeutic Activites    Therapeutic Activities Other Therapeutic Activities    Other Therapeutic Activities discussed with pt PT POC - for 8 weeks and pt runs out of appts at the end of the week (would need to schedule out for another 4 weeks) and re-scheduling pt's appt this Thursday to be more consistent with therapists (this would be the 5th therapist pt would have seen). Also gave different options for a seated peddle/recumbent bike; discussed the [employer] - pt does not have a gym membership and is not interested in this option, seated recumbent bike (gave examples on [place] for home purchase), if recumbent bike is too expensive - another option is seated peddle bike, trialed one in clinic with pt sitting with back support - pt liked this option. Gave handout on fall prevention in the home. At end of session discussed above information with pt's spouse (who was waiting out in waiting room) and gave clinic phone number for pt's spouse to call back to schedule pt's appts (did not have their calendar today to schedule)     Exercises   Exercises Knee/Hip      Knee/Hip Exercises: Aerobic   Stepper with BUE/BLE for 6 minutes at gear 2.5 cues for rpm between 75-80, RPE at 7/10, last 30 seconds incr intensity to 80-85. discussed importance of aerobic activity in regards to PD                  PT Education - 11/16/20 1214    Education Details see TA.    Person(s) Educated Patient;Spouse    Methods Explanation;Handout    Comprehension Verbalized understanding            PT Short Term Goals - 11/16/20 1051      PT SHORT TERM GOAL #1   Title Pt will perform HEP with family supervision for improved strength, balance, transfers, and gait.  TARGET 11/12/2020    Baseline  pt reports intermittently performing his exercises at home - did not have time to formally review entirety of exercise program.    Time 4    Period Weeks    Status Partially Met      PT SHORT TERM GOAL #2   Title MiniBESTest to be completed, with goal to be written as appropriate.    Baseline Mini-Best 24/28 on 10/22/20. SLS goal written in LTG  Time 4    Period Weeks    Status Achieved      PT SHORT TERM GOAL #3   Title 6MWT to be assessed, with goal to be written as appropriate.    Baseline 6MWT performed completing 1035' on 10/22/20. LTG written    Time 4    Period Weeks    Status Achieved      PT SHORT TERM GOAL #4   Title Pt will improve TUG score to less than or equal to 13.5 sec for decreased fall risk.    Baseline 9 seconds on 11/16/20    Time 4    Period Weeks    Status Achieved      PT SHORT TERM GOAL #5   Title Pt will verbalize understanding of fall prevention in home environment.    Baseline provided handout to patient and reviewed with pt's spouse at end of session.    Time 4    Period Weeks    Status Achieved             PT Long Term Goals - 10/22/20 1537      PT LONG TERM GOAL #1   Title Pt will perform HEP with family supervision for improved strength, balance, transfers, and gait.  TARGET 12/10/20    Time 8    Period Weeks    Status New      PT LONG TERM GOAL #2   Title Pt will improve TUG/TUG cognitive score to less than or equal to 10% difference, for improved dual tasking with gait.    Time 8    Period Weeks    Status New      PT LONG TERM GOAL #3   Title Pt will improve gait velocity to at least 2.8 ft/sec for improved efficiency and safety with community gait.    Time 8    Period Weeks    Status New      PT LONG TERM GOAL #4   Title Pt will ambulate at least 1000 ft, indoor and outdoor surfaces, mod independnetly for improved community and outdoor gait safety.    Time 8    Period Weeks    Status New      PT LONG TERM GOAL #5   Title  Pt will increase SLS time to >20 sec each leg for improved balance.    Baseline <20 sec when performed in mini-Best    Time 8    Period Weeks    Status New      Additional Long Term Goals   Additional Long Term Goals Yes      PT LONG TERM GOAL #6   Title Pt will increase 6 min walk distance to >1235' for improved community mobility.    Baseline 10/22/20 1035'    Time 8    Period Weeks    Status New                 Plan - 11/16/20 1218    Clinical Impression Statement Assessed pt's STGs today with pt meeting STG in regards to TUG, performed today in 9 seconds, indicating a decr fall risk. Also provided handout for fall prevention education in the home and reviewed with pt and pt's spouse. Gave options for potential seated stepper options at home (based on finances); for seated peddle bike and seated recumbent bike from [employer] (pt's daughter has an account to help pt purchase). Discussed option of a gym too for seated stepper/recumbent bike, but pt  was not interested in this option. Will continue to progress towards LTGs.    Personal Factors and Comorbidities Comorbidity 3+    Comorbidities PMH:  chronic back pain, DDD, fatigue, GERD, glaucoma, hx of syncope, OA,    Examination-Activity Limitations Locomotion Level;Transfers;Stairs    Examination-Participation Restrictions Community Activity    Stability/Clinical Decision Making Evolving/Moderate complexity    Rehab Potential Good    PT Frequency 2x / week    PT Duration 8 weeks   including eval week   PT Treatment/Interventions ADLs/Self Care Home Management;Gait training;Stair training;Functional mobility training;Therapeutic activities;Therapeutic exercise;Balance training;Neuromuscular re-education;DME Instruction;Patient/family education    PT Next Visit Plan did pt add more appts? review seated PWR moves as needed, standing PWR moves. SciFit, gait training for arm swing/step length. any questions about seated stepped or  recumbent bike?    PT Home Exercise Plan seated PWR moves. Access Code: [email or web address]    Consulted and Agree with Plan of Care Patient    Family Member Consulted daughter           Patient will benefit from skilled therapeutic intervention in order to improve the following deficits and impairments:  Abnormal gait,Difficulty walking,Decreased balance,Decreased mobility,Decreased strength,Postural dysfunction  Visit Diagnosis: Other abnormalities of gait and mobility  Unsteadiness on feet  Muscle weakness (generalized)  Abnormal posture     Problem List Patient Active Problem List   Diagnosis Date Noted  . Seizure-like activity ([place]) 09/29/2020  . Rash 09/29/2020  . Parkinson's disease ([place]) 09/29/2020  . AKI (acute kidney injury) ([place]) 09/29/2020  . Leukocytosis 09/29/2020  . BPH with obstruction/lower urinary tract symptoms 09/23/2020  . Pain in unspecified shoulder 12/18/2019  . 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/2019  . Unspecified hemorrhoids 12/18/2019  . Renal stone 12/18/2019  . Vitamin D deficiency, unspecified 12/18/2019  . Male erectile dysfunction 12/18/2019  . Osteoarthritis of knee, unspecified 12/18/2019  . Impaired fasting glucose 12/18/2019  . Gastroesophageal reflux disease without esophagitis 12/18/2019  . Essential hypertension 12/18/2019  . Sebaceous cyst 12/18/2019  . Mixed hyperlipidemia 12/18/2019  . Fatigue 12/18/2019  . Pain in hand 12/18/2019  . Onychomycosis 12/18/2019  . Other spondylosis with radiculopathy, cervical region 12/18/2019  . BPH with urinary obstruction 12/18/2019  . Toxic effect of venom of wasps, accidental (unintentional), initial encounter 12/18/2019  . Medication management 12/18/2019  . Right flank pain 12/18/2019  . Memory loss 12/18/2019  . Family history of hemochromatosis 12/18/2019  . Pure hypercholesterolemia 12/18/2019  . Hyperglycemia 12/18/2019    Arliss Journey, PT, DPT  11/16/2020, 12:23 PM  [facility] [address] [place], [place], [address] Phone: [phone]   Fax:  [phone]  Name: Seth Carter MRN: [id number] Date of Birth: 06-Oct-1940

## 2020-11-16 NOTE — Patient Instructions (Signed)
Fall Prevention in the Home, Adult Falls can cause injuries and can happen to people of all ages. There are many things you can do to make your home safe and to help prevent falls. Ask for help when making these changes. What actions can I take to prevent falls? General Instructions  Use good lighting in all rooms. Replace any light bulbs that burn out.  Turn on the lights in dark areas. Use night-lights.  Keep items that you use often in easy-to-reach places. Lower the shelves around your home if needed.  Set up your furniture so you have a clear path. Avoid moving your furniture around.  Do not have throw rugs or other things on the floor that can make you trip.  Avoid walking on wet floors.  If any of your floors are uneven, fix them.  Add color or contrast paint or tape to clearly mark and help you see: ? Grab bars or handrails. ? First and last steps of staircases. ? Where the edge of each step is.  If you use a stepladder: ? Make sure that it is fully opened. Do not climb a closed stepladder. ? Make sure the sides of the stepladder are locked in place. ? Ask someone to hold the stepladder while you use it.  Know where your pets are when moving through your home. What can I do in the bathroom?  Keep the floor dry. Clean up any water on the floor right away.  Remove soap buildup in the tub or shower.  Use nonskid mats or decals on the floor of the tub or shower.  Attach bath mats securely with double-sided, nonslip rug tape.  If you need to sit down in the shower, use a plastic, nonslip stool.  Install grab bars by the toilet and in the tub and shower. Do not use towel bars as grab bars.      What can I do in the bedroom?  Make sure that you have a light by your bed that is easy to reach.  Do not use any sheets or blankets for your bed that hang to the floor.  Have a firm chair with side arms that you can use for support when you get dressed. What can I do in  the kitchen?  Clean up any spills right away.  If you need to reach something above you, use a step stool with a grab bar.  Keep electrical cords out of the way.  Do not use floor polish or wax that makes floors slippery. What can I do with my stairs?  Do not leave any items on the stairs.  Make sure that you have a light switch at the top and the bottom of the stairs.  Make sure that there are handrails on both sides of the stairs. Fix handrails that are broken or loose.  Install nonslip stair treads on all your stairs.  Avoid having throw rugs at the top or bottom of the stairs.  Choose a carpet that does not hide the edge of the steps on the stairs.  Check carpeting to make sure that it is firmly attached to the stairs. Fix carpet that is loose or worn. What can I do on the outside of my home?  Use bright outdoor lighting.  Fix the edges of walkways and driveways and fix any cracks.  Remove anything that might make you trip as you walk through a door, such as a raised step or threshold.  Trim any   bushes or trees on paths to your home.  Check to see if handrails are loose or broken and that both sides of all steps have handrails.  Install guardrails along the edges of any raised decks and porches.  Clear paths of anything that can make you trip, such as tools or rocks.  Have leaves, snow, or ice cleared regularly.  Use sand or salt on paths during winter.  Clean up any spills in your garage right away. This includes grease or oil spills. What other actions can I take?  Wear shoes that: ? Have a low heel. Do not wear high heels. ? Have rubber bottoms. ? Feel good on your feet and fit well. ? Are closed at the toe. Do not wear open-toe sandals.  Use tools that help you move around if needed. These include: ? Canes. ? Walkers. ? Scooters. ? Crutches.  Review your medicines with your doctor. Some medicines can make you feel dizzy. This can increase your chance  of falling. Ask your doctor what else you can do to help prevent falls. Where to find more information  Centers for Disease Control and Prevention, [employer]: www.cdc.gov  National Institute on Aging: www.nia.nih.gov Contact a doctor if:  You are afraid of falling at home.  You feel weak, drowsy, or dizzy at home.  You fall at home. Summary  There are many simple things that you can do to make your home safe and to help prevent falls.  Ways to make your home safe include removing things that can make you trip and installing grab bars in the bathroom.  Ask for help when making these changes in your home. This information is not intended to replace advice given to you by your health care provider. Make sure you discuss any questions you have with your health care provider. Document Revised: 02/11/2020 Document Reviewed: 02/11/2020 Elsevier Patient Education  2021 Elsevier Inc.  

## 2020-11-18 ENCOUNTER — Ambulatory Visit: Payer: Medicare Other | Admitting: Physical Therapy

## 2020-11-18 ENCOUNTER — Other Ambulatory Visit: Payer: Self-pay

## 2020-11-18 ENCOUNTER — Encounter: Payer: Self-pay | Admitting: Physical Therapy

## 2020-11-18 DIAGNOSIS — R29818 Other symptoms and signs involving the nervous system: Secondary | ICD-10-CM | POA: Diagnosis not present

## 2020-11-18 DIAGNOSIS — M6281 Muscle weakness (generalized): Secondary | ICD-10-CM | POA: Diagnosis not present

## 2020-11-18 DIAGNOSIS — R2681 Unsteadiness on feet: Secondary | ICD-10-CM

## 2020-11-18 DIAGNOSIS — R293 Abnormal posture: Secondary | ICD-10-CM | POA: Diagnosis not present

## 2020-11-18 DIAGNOSIS — R2689 Other abnormalities of gait and mobility: Secondary | ICD-10-CM | POA: Diagnosis not present

## 2020-11-19 ENCOUNTER — Ambulatory Visit: Payer: Medicare Other | Admitting: Physical Therapy

## 2020-11-20 NOTE — Therapy (Signed)
[place] [address] [place], [place], [address] Phone: [phone]   Fax:  [phone]  Physical Therapy Treatment  Patient Details  Name: Seth Carter MRN: [id number] Date of Birth: 20-Oct-1940 Referring Provider (PT): Alonza Bogus, DO   Encounter Date: 11/18/2020     11/18/20 1537  PT Visits / Re-Eval  Visit Number 8  Number of Visits 17  Date for PT Re-Evaluation 01/18/21  Authorization  Authorization Type Medicare and UHC-Follow Medicare guidelines  Progress Note Due on Visit 10  PT Time Calculation  PT Start Time 1532  PT Stop Time 1615  PT Time Calculation (min) 43 min  PT - End of Session  Equipment Utilized During Treatment Gait belt  Activity Tolerance Patient tolerated treatment well  Behavior During Therapy [facility] for tasks assessed/performed    Past Medical History:  Diagnosis Date  . Allergic rhinitis, seasonal   . B12 deficiency   . BPH with obstruction/lower urinary tract symptoms   . Bradykinesia    neruologist--- dr tat  . Chronic back pain   . Chronically dry eyes   . DDD (degenerative disc disease), lumbar   . ED (erectile dysfunction)   . Essential hypertension    nuclear test in epic 01-16-2020 normal perfusion no ischemia, normal lvf and wall motion, nuclear ef 65%  . Family history of hemochromatosis   . Fatigue   . Generalized weakness   . GERD (gastroesophageal reflux disease)   . Glaucoma, both eyes   . Hemorrhoids   . History of chronic sinusitis   . History of kidney stones   . History of syncope    cardiology evaluation by dr Marlou Porch, note in epic 12-26-2019, work-up includes event monitor 02-04-2020( showed SR with occ PAC & PVC rare PAT)/ echo  12-26-2019 (mild LVH, ef 50-55%, moderate MR, mild to moderate AV sclerosis no stenosis, ascending aorta 66mm) and nuclear test 01-16-2020 (normal perfusion w/ normal LV and wall motion, nuclear ef 65%)  . Hypogonadism in male   .  Macular degeneration, right eye   . Mixed hyperlipidemia   . OA (osteoarthritis)   . Other spondylosis with radiculopathy, cervical region   . Parkinson's disease [facility]) 08/2020   neurologist--- dr tat--  newly dx 02/ 2022  . Sinusitis, acute 08/2020   per pt started antibiotic on 09-20-2020, has cough nasal congestion, cough, no fever  . Sting of hornets, wasps, and bees causing poisoning and toxic reactions   . Vitamin D deficiency   . Wears glasses     Past Surgical History:  Procedure Laterality Date  . ANKLE SURGERY Right 10-04-2015  $[email or web address]$    debridement achilles tendon & reattachment and heel excision osteophyte  . CATARACT EXTRACTION W/ INTRAOCULAR LENS  IMPLANT, BILATERAL  yrs ago  . CYSTOSCOPY WITH INSERTION OF UROLIFT  2019 approx.  Marland Kitchen SHOULDER SURGERY Right 1990's  . TONSILLECTOMY  age 26  . TRANSURETHRAL RESECTION OF PROSTATE N/A 09/23/2020   Procedure: TRANSURETHRAL RESECTION OF THE PROSTATE (TURP);  Surgeon: [name], MD;  Location: [facility]  Service: Urology;  Laterality: N/A;  75 MINS    There were no vitals filed for this visit.     11/18/20 1536  Symptoms/Limitations  Subjective No new complaints. No falls or pain to report. Has not been able to spend a lot of time on his HEP due to multiple appt's and household chores.  Pertinent History macular degeneration with difficulty with depth perception  Patient Stated  Goals Pt's goal for therapy is to do everything he wants to do, keep strength up.  Pain Assessment  Currently in Pain? No/denies      11/18/20 1539  Transfers  Transfers Sit to Stand;Stand to Sit  Sit to Stand 5: Supervision  Stand to Sit 5: Supervision  Ambulation/Gait  Ambulation/Gait Yes  Ambulation/Gait Assistance 5: Supervision  Ambulation/Gait Assistance Details around gym with session.  Assistive device None  Gait Pattern Step-through pattern;Decreased arm swing - right;Decreased arm swing - left;Decreased step  length - right;Decreased step length - left  Ambulation Surface Level;Indoor  Neuro Re-ed   Neuro Re-ed Details  for balance/NMR: gait around track with speed changes, scanning all directions with min guard assist for safety; then gait with walking  poles working on increased reciprocal arm swing and increased step length with gait.       11/18/20 0001  Balance Exercises: Standing  Sit to Stand Standard surface;Foam/compliant surface;Limitations  Sit to Stand Limitations seated with feet on airex- sit<>stand for 10 reps with cues for tall posture and controlled descent. Min guard to min assist for balance in standing and sitting down slowly with light UE support.       Pt performs PWR! Moves in standing position    PWR! Up for improved posture x 10 reps  PWR! Rock for improved weighshifting x 10 reps  PWR! Twist for improved trunk rotation x 10 reps-cues to use foot to pivot for clapping hands, cues for opening arms back out wide between reps "to reset", x 10 reps  PWR! Step for improved step initiation x 10 reps-cues for high/wide step length, difficulty coordination UE movements with stepping, pt ends up stepping then reaching out  Verbal, visual, tactile cues provided for technique and intensity. Performed next to mat table with chair in front for safety/balance assist as needed.        PT Short Term Goals - 11/16/20 1051      PT SHORT TERM GOAL #1   Title Pt will perform HEP with family supervision for improved strength, balance, transfers, and gait.  TARGET 11/12/2020    Baseline pt reports intermittently performing his exercises at home - did not have time to formally review entirety of exercise program.    Time 4    Period Weeks    Status Partially Met      PT SHORT TERM GOAL #2   Title MiniBESTest to be completed, with goal to be written as appropriate.    Baseline Mini-Best 24/28 on 10/22/20. SLS goal written in LTG    Time 4    Period Weeks    Status Achieved       PT SHORT TERM GOAL #3   Title 6MWT to be assessed, with goal to be written as appropriate.    Baseline 6MWT performed completing 1035' on 10/22/20. LTG written    Time 4    Period Weeks    Status Achieved      PT SHORT TERM GOAL #4   Title Pt will improve TUG score to less than or equal to 13.5 sec for decreased fall risk.    Baseline 9 seconds on 11/16/20    Time 4    Period Weeks    Status Achieved      PT SHORT TERM GOAL #5   Title Pt will verbalize understanding of fall prevention in home environment.    Baseline provided handout to patient and reviewed with pt's spouse at end of session.  Time 4    Period Weeks    Status Achieved             PT Long Term Goals - 10/22/20 1537      PT LONG TERM GOAL #1   Title Pt will perform HEP with family supervision for improved strength, balance, transfers, and gait.  TARGET 12/10/20    Time 8    Period Weeks    Status New      PT LONG TERM GOAL #2   Title Pt will improve TUG/TUG cognitive score to less than or equal to 10% difference, for improved dual tasking with gait.    Time 8    Period Weeks    Status New      PT LONG TERM GOAL #3   Title Pt will improve gait velocity to at least 2.8 ft/sec for improved efficiency and safety with community gait.    Time 8    Period Weeks    Status New      PT LONG TERM GOAL #4   Title Pt will ambulate at least 1000 ft, indoor and outdoor surfaces, mod independnetly for improved community and outdoor gait safety.    Time 8    Period Weeks    Status New      PT LONG TERM GOAL #5   Title Pt will increase SLS time to >20 sec each leg for improved balance.    Baseline <20 sec when performed in mini-Best    Time 8    Period Weeks    Status New      Additional Long Term Goals   Additional Long Term Goals Yes      PT LONG TERM GOAL #6   Title Pt will increase 6 min walk distance to >1235' for improved community mobility.    Baseline 10/22/20 1035'    Time 8    Period Weeks     Status New              11/18/20 1537  Plan  Clinical Impression Statement Today's skilled session contineud to focus on gait with emphasis on increased arm swing/step length, dynamic gait activities and PWR! standing moves to address posture/weightshifting and big movements with no issues noted or reported in session. The pt is progressing toward goals and should benefit from continued PT to progress toward unmet goals.  Personal Factors and Comorbidities Comorbidity 3+  Comorbidities PMH:  chronic back pain, DDD, fatigue, GERD, glaucoma, hx of syncope, OA,  Examination-Activity Limitations Locomotion Level;Transfers;Stairs  Examination-Participation Restrictions Community Activity  Pt will benefit from skilled therapeutic intervention in order to improve on the following deficits Abnormal gait;Difficulty walking;Decreased balance;Decreased mobility;Decreased strength;Postural dysfunction  Stability/Clinical Decision Making Evolving/Moderate complexity  Rehab Potential Good  PT Frequency 2x / week  PT Duration 8 weeks (including eval week)  PT Treatment/Interventions ADLs/Self Care Home Management;Gait training;Stair training;Functional mobility training;Therapeutic activities;Therapeutic exercise;Balance training;Neuromuscular re-education;DME Instruction;Patient/family education  PT Next Visit Plan did pt add more appts? review seated PWR moves as needed, standing PWR moves. SciFit, gait training for arm swing/step length. any questions about seated stepped or recumbent bike?  PT Home Exercise Plan seated PWR moves. Access Code: [email or web address]  Consulted and Agree with Plan of Care Patient  Family Member Consulted daughter         Patient will benefit from skilled therapeutic intervention in order to improve the following deficits and impairments:  Abnormal gait,Difficulty walking,Decreased balance,Decreased mobility,Decreased strength,Postural dysfunction  Visit Diagnosis: Other  abnormalities  of gait and mobility  Unsteadiness on feet  Muscle weakness (generalized)  Abnormal posture     Problem List Patient Active Problem List   Diagnosis Date Noted  . Seizure-like activity ([place]) 09/29/2020  . Rash 09/29/2020  . Parkinson's disease ([place]) 09/29/2020  . AKI (acute kidney injury) ([place]) 09/29/2020  . Leukocytosis 09/29/2020  . BPH with obstruction/lower urinary tract symptoms 09/23/2020  . Pain in unspecified shoulder 12/18/2019  . 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/2019  . Unspecified hemorrhoids 12/18/2019  . Renal stone 12/18/2019  . Vitamin D deficiency, unspecified 12/18/2019  . Male erectile dysfunction 12/18/2019  . Osteoarthritis of knee, unspecified 12/18/2019  . Impaired fasting glucose 12/18/2019  . Gastroesophageal reflux disease without esophagitis 12/18/2019  . Essential hypertension 12/18/2019  . Sebaceous cyst 12/18/2019  . Mixed hyperlipidemia 12/18/2019  . Fatigue 12/18/2019  . Pain in hand 12/18/2019  . Onychomycosis 12/18/2019  . Other spondylosis with radiculopathy, cervical region 12/18/2019  . BPH with urinary obstruction 12/18/2019  . Toxic effect of venom of wasps, accidental (unintentional), initial encounter 12/18/2019  . Medication management 12/18/2019  . Right flank pain 12/18/2019  . Memory loss 12/18/2019  . Family history of hemochromatosis 12/18/2019  . Pure hypercholesterolemia 12/18/2019  . Hyperglycemia 12/18/2019    Willow Ora, PTA, [facility] [address], [place], [place] [address] [phone] 11/20/20, 8:26 PM   Name: TAREEK SABO MRN: [id number] Date of Birth: [birth year]

## 2020-11-23 ENCOUNTER — Ambulatory Visit: Payer: Medicare Other | Attending: Neurology | Admitting: Physical Therapy

## 2020-11-23 ENCOUNTER — Other Ambulatory Visit: Payer: Self-pay

## 2020-11-23 ENCOUNTER — Encounter: Payer: Self-pay | Admitting: Physical Therapy

## 2020-11-23 DIAGNOSIS — R29818 Other symptoms and signs involving the nervous system: Secondary | ICD-10-CM | POA: Diagnosis not present

## 2020-11-23 DIAGNOSIS — R2689 Other abnormalities of gait and mobility: Secondary | ICD-10-CM

## 2020-11-23 DIAGNOSIS — R293 Abnormal posture: Secondary | ICD-10-CM | POA: Diagnosis not present

## 2020-11-23 DIAGNOSIS — R2681 Unsteadiness on feet: Secondary | ICD-10-CM | POA: Diagnosis not present

## 2020-11-23 NOTE — Therapy (Signed)
[place] [address] [place], [place], [address] Phone: [phone]   Fax:  [phone]  Physical Therapy Treatment  Patient Details  Name: Seth Carter MRN: [id number] Date of Birth: 29-Mar-1941 Referring Provider (PT): Alonza Bogus, DO   Encounter Date: 11/23/2020   PT End of Session - 11/23/20 1620    Visit Number 9    Number of Visits 17    Date for PT Re-Evaluation 01/18/21    Authorization Type Medicare and UHC-Follow Medicare guidelines    Progress Note Due on Visit 10    PT Start Time 1234    PT Stop Time 1315    PT Time Calculation (min) 41 min    Equipment Utilized During Treatment Gait belt    Activity Tolerance Patient tolerated treatment well    Behavior During Therapy [facility] for tasks assessed/performed           Past Medical History:  Diagnosis Date  . Allergic rhinitis, seasonal   . B12 deficiency   . BPH with obstruction/lower urinary tract symptoms   . Bradykinesia    neruologist--- dr tat  . Chronic back pain   . Chronically dry eyes   . DDD (degenerative disc disease), lumbar   . ED (erectile dysfunction)   . Essential hypertension    nuclear test in epic 01-16-2020 normal perfusion no ischemia, normal lvf and wall motion, nuclear ef 65%  . Family history of hemochromatosis   . Fatigue   . Generalized weakness   . GERD (gastroesophageal reflux disease)   . Glaucoma, both eyes   . Hemorrhoids   . History of chronic sinusitis   . History of kidney stones   . History of syncope    cardiology evaluation by dr Marlou Porch, note in epic 12-26-2019, work-up includes event monitor 02-04-2020( showed SR with occ PAC & PVC rare PAT)/ echo  12-26-2019 (mild LVH, ef 50-55%, moderate MR, mild to moderate AV sclerosis no stenosis, ascending aorta 102m) and nuclear test 01-16-2020 (normal perfusion w/ normal LV and wall motion, nuclear ef 65%)  . Hypogonadism in male   . Macular degeneration, right eye    . Mixed hyperlipidemia   . OA (osteoarthritis)   . Other spondylosis with radiculopathy, cervical region   . Parkinson's disease ([facility] 08/2020   neurologist--- dr tat--  newly dx 02/ 2022  . Sinusitis, acute 08/2020   per pt started antibiotic on 09-20-2020, has cough nasal congestion, cough, no fever  . Sting of hornets, wasps, and bees causing poisoning and toxic reactions   . Vitamin D deficiency   . Wears glasses     Past Surgical History:  Procedure Laterality Date  . ANKLE SURGERY Right 10-04-2015  _0    debridement achilles tendon & reattachment and heel excision osteophyte  . CATARACT EXTRACTION W/ INTRAOCULAR LENS  IMPLANT, BILATERAL  yrs ago  . CYSTOSCOPY WITH INSERTION OF UROLIFT  2019 approx.  .Marland KitchenSHOULDER SURGERY Right 1990's  . TONSILLECTOMY  age 80 . TRANSURETHRAL RESECTION OF PROSTATE N/A 09/23/2020   Procedure: TRANSURETHRAL RESECTION OF THE PROSTATE (TURP);  Surgeon: DFranchot Gallo MD;  [facility]  Service: Urology;  Laterality: N/A;  75 MINS    There were no vitals filed for this visit.   Subjective Assessment - 11/23/20 1232    Subjective Nothing new.  Occasionally have some shoulder pain-mostly with reaching down to the ground.  May be a shoulder spur and plan to get it looked  at.  No pain with PT exercises.  Will need another picture of my exercises.    Pertinent History macular degeneration with difficulty with depth perception    Patient Stated Goals Pt's goal for therapy is to do everything he wants to do, keep strength up.    Currently in Pain? No/denies                 Pt performs PWR! Moves in seated position x 20 reps   PWR! Up for improved posture  PWR! Rock for improved weighshifting  PWR! Twist for improved trunk rotation-cues to stop in middle, use foot to pivot for improved flexibility  PWR! Step for improved step initiation-cues for double leg step out and in (used obstacle to step over, last 5 reps  each side)  Cues provided for increased amplitude and intensity of movements   Pt performs PWR! Moves in standing position x 10reps   PWR! Up for improved posture  PWR! Rock for improved weighshifting  PWR! Twist for improved trunk rotation-cues to stop in midline and use foot to pivot for improved flexibility  PWR! Step for improved step initiation-cues for foot clearance and coordinated UE positions  Cues provided for increased amplitude, intensity                  [facility] Adult PT Treatment/Exercise - 11/23/20 0001      Ambulation/Gait   Ambulation/Gait Yes    Ambulation/Gait Assistance 5: Supervision    Ambulation Distance (Feet) 400 Feet   then 250 ft   Assistive device None    Gait Pattern Step-through pattern;Decreased arm swing - right;Decreased arm swing - left;Decreased step length - right;Decreased step length - left;Decreased trunk rotation    Ambulation Surface Level;Indoor    Gait Comments Tactile cues provided at shoulders to encourage trunk rotation and arm swing; pt unable to maintain reciprocal arm swing.  Additional short distance gait 10 ft, with counting steps (6 steps), then with attention to longer step length, pt able to take 4 steps x 3 reps.  Additional cues for longer distance gait to keep taking long strides.               Balance Exercises - 11/23/20 0001      Balance Exercises: Standing   Stepping Strategy Posterior;Anterior;Foam/compliant surface;UE support;Limitations    Stepping Strategy Limitations Initiated forward step and weightshift on solid ground x 10 reps, then at counter with UE support for back step and weightshfit; cues to incorporate posterior weigthshift and hinge at hips.  Standing on blue balance beam:  forward<>back step and weigthshift with cues for increased foot clearance, x 10 reps.    Other Standing Exercises Trunk rotation work in corner, widened BOS and reaching across to touch wall target, x 10 reps, cues to  use foot to pivot for improved reach.    Other Standing Exercises Comments Stagger stance rocking at counter then addition of single arm swign forward/back wtih tactile cues for sequence, x 12 reps each side.             PT Education - 11/23/20 1619    Education Details Provided handout again for seated PWR! Moves; also added standing PWR! Moves to [employer]) Educated Patient    Methods Explanation;Demonstration;Verbal cues;Handout    Comprehension Verbalized understanding;Returned demonstration            PT Short Term Goals - 11/16/20 1051      PT SHORT TERM GOAL #  1   Title Pt will perform HEP with family supervision for improved strength, balance, transfers, and gait.  TARGET 11/12/2020    Baseline pt reports intermittently performing his exercises at home - did not have time to formally review entirety of exercise program.    Time 4    Period Weeks    Status Partially Met      PT SHORT TERM GOAL #2   Title MiniBESTest to be completed, with goal to be written as appropriate.    Baseline Mini-Best 24/28 on 10/22/20. SLS goal written in LTG    Time 4    Period Weeks    Status Achieved      PT SHORT TERM GOAL #3   Title 6MWT to be assessed, with goal to be written as appropriate.    Baseline 6MWT performed completing 1035' on 10/22/20. LTG written    Time 4    Period Weeks    Status Achieved      PT SHORT TERM GOAL #4   Title Pt will improve TUG score to less than or equal to 13.5 sec for decreased fall risk.    Baseline 9 seconds on 11/16/20    Time 4    Period Weeks    Status Achieved      PT SHORT TERM GOAL #5   Title Pt will verbalize understanding of fall prevention in home environment.    Baseline provided handout to patient and reviewed with pt's spouse at end of session.    Time 4    Period Weeks    Status Achieved             PT Long Term Goals - 10/22/20 1537      PT LONG TERM GOAL #1   Title Pt will perform HEP with family supervision for  improved strength, balance, transfers, and gait.  TARGET 12/10/20    Time 8    Period Weeks    Status New      PT LONG TERM GOAL #2   Title Pt will improve TUG/TUG cognitive score to less than or equal to 10% difference, for improved dual tasking with gait.    Time 8    Period Weeks    Status New      PT LONG TERM GOAL #3   Title Pt will improve gait velocity to at least 2.8 ft/sec for improved efficiency and safety with community gait.    Time 8    Period Weeks    Status New      PT LONG TERM GOAL #4   Title Pt will ambulate at least 1000 ft, indoor and outdoor surfaces, mod independnetly for improved community and outdoor gait safety.    Time 8    Period Weeks    Status New      PT LONG TERM GOAL #5   Title Pt will increase SLS time to >20 sec each leg for improved balance.    Baseline <20 sec when performed in mini-Best    Time 8    Period Weeks    Status New      Additional Long Term Goals   Additional Long Term Goals Yes      PT LONG TERM GOAL #6   Title Pt will increase 6 min walk distance to >1235' for improved community mobility.    Baseline 10/22/20 1035'    Time 8    Period Weeks    Status New  Plan - 11/23/20 1620    Clinical Impression Statement Continued focus on PWR! Moves sitting (pt reports perfomring x 2 reps this past week) and standing PWR! Moves.  Also worked on trunk rotation, step length and gait activities for improved step length and arm swing.  Pt is able to improve amplitude of movement with cues, but reverts to smaller, slower movements without additional cues.  Will ocntinue to benefit from skilled PT to further address balance, gait activities incorporating large, whole body movements towards LTGs.    Personal Factors and Comorbidities Comorbidity 3+    Comorbidities PMH:  chronic back pain, DDD, fatigue, GERD, glaucoma, hx of syncope, OA,    Examination-Activity Limitations Locomotion Level;Transfers;Stairs     Examination-Participation Restrictions Community Activity    Stability/Clinical Decision Making Evolving/Moderate complexity    Rehab Potential Good    PT Frequency 2x / week    PT Duration 8 weeks   including eval week   PT Treatment/Interventions ADLs/Self Care Home Management;Gait training;Stair training;Functional mobility training;Therapeutic activities;Therapeutic exercise;Balance training;Neuromuscular re-education;DME Instruction;Patient/family education    PT Next Visit Plan Discuss community PD resources (pt asked about this at the end of the session);  review seated PWR moves as needed, and review standing PWR moves. SciFit, gait training for arm swing/step length.    PT Home Exercise Plan seated PWR moves. Access Code: [email or web address]    Consulted and Agree with Plan of Care Patient    Family Member Consulted daughter           Patient will benefit from skilled therapeutic intervention in order to improve the following deficits and impairments:  Abnormal gait,Difficulty walking,Decreased balance,Decreased mobility,Decreased strength,Postural dysfunction  Visit Diagnosis: Unsteadiness on feet  Other abnormalities of gait and mobility  Other symptoms and signs involving the nervous system     Problem List Patient Active Problem List   Diagnosis Date Noted  . Seizure-like activity ([place]) 09/29/2020  . Rash 09/29/2020  . Parkinson's disease ([place]) 09/29/2020  . AKI (acute kidney injury) ([place]) 09/29/2020  . Leukocytosis 09/29/2020  . BPH with obstruction/lower urinary tract symptoms 09/23/2020  . Pain in unspecified shoulder 12/18/2019  . 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/2019  . Unspecified hemorrhoids 12/18/2019  . Renal stone 12/18/2019  . Vitamin D deficiency, unspecified 12/18/2019  . Male erectile dysfunction 12/18/2019  . Osteoarthritis of knee, unspecified 12/18/2019  . Impaired fasting glucose 12/18/2019  . Gastroesophageal reflux disease without esophagitis 12/18/2019   . Essential hypertension 12/18/2019  . Sebaceous cyst 12/18/2019  . Mixed hyperlipidemia 12/18/2019  . Fatigue 12/18/2019  . Pain in hand 12/18/2019  . Onychomycosis 12/18/2019  . Other spondylosis with radiculopathy, cervical region 12/18/2019  . BPH with urinary obstruction 12/18/2019  . Toxic effect of venom of wasps, accidental (unintentional), initial encounter 12/18/2019  . Medication management 12/18/2019  . Right flank pain 12/18/2019  . Memory loss 12/18/2019  . Family history of hemochromatosis 12/18/2019  . Pure hypercholesterolemia 12/18/2019  . Hyperglycemia 12/18/2019    Izaan Kingbird W. 11/23/2020, 4:23 PM  Frazier Butt., PT   [place] [address] [place], [place], [address] Phone: [phone]   Fax:  [phone]  Name: Seth Carter MRN: [id number] Date of Birth: 09-11-1940

## 2020-11-25 ENCOUNTER — Ambulatory Visit: Payer: Medicare Other | Admitting: Physical Therapy

## 2020-11-25 ENCOUNTER — Encounter: Payer: Self-pay | Admitting: Physical Therapy

## 2020-11-25 ENCOUNTER — Other Ambulatory Visit: Payer: Self-pay

## 2020-11-25 DIAGNOSIS — R293 Abnormal posture: Secondary | ICD-10-CM

## 2020-11-25 DIAGNOSIS — R2681 Unsteadiness on feet: Secondary | ICD-10-CM

## 2020-11-25 DIAGNOSIS — R2689 Other abnormalities of gait and mobility: Secondary | ICD-10-CM

## 2020-11-25 DIAGNOSIS — R29818 Other symptoms and signs involving the nervous system: Secondary | ICD-10-CM

## 2020-11-25 NOTE — Therapy (Signed)
[facility] [address] [place], [place], [address] Phone: [phone]   Fax:  [phone]  Physical Therapy Treatment/10th Visit Progress Note  Patient Details  Name: Seth Carter MRN: [id number] Date of Birth: 06/09/1941 Referring Provider (PT): Alonza Bogus, DO   Encounter Date: 11/25/2020   PT End of Session - 11/25/20 1331    Visit Number 10    Number of Visits 17    Date for PT Re-Evaluation 01/18/21    Authorization Type Medicare and UHC-Follow Medicare guidelines    Progress Note Due on Visit 10    PT Start Time 1233    PT Stop Time 1318    PT Time Calculation (min) 45 min    Equipment Utilized During Treatment --    Activity Tolerance Patient tolerated treatment well    Behavior During Therapy [facility] for tasks assessed/performed           Past Medical History:  Diagnosis Date  . Allergic rhinitis, seasonal   . B12 deficiency   . BPH with obstruction/lower urinary tract symptoms   . Bradykinesia    neruologist--- dr tat  . Chronic back pain   . Chronically dry eyes   . DDD (degenerative disc disease), lumbar   . ED (erectile dysfunction)   . Essential hypertension    nuclear test in epic 01-16-2020 normal perfusion no ischemia, normal lvf and wall motion, nuclear ef 65%  . Family history of hemochromatosis   . Fatigue   . Generalized weakness   . GERD (gastroesophageal reflux disease)   . Glaucoma, both eyes   . Hemorrhoids   . History of chronic sinusitis   . History of kidney stones   . History of syncope    cardiology evaluation by dr Marlou Porch, note in epic 12-26-2019, work-up includes event monitor 02-04-2020( showed SR with occ PAC & PVC rare PAT)/ echo  12-26-2019 (mild LVH, ef 50-55%, moderate MR, mild to moderate AV sclerosis no stenosis, ascending aorta 86m) and nuclear test 01-16-2020 (normal perfusion w/ normal LV and wall motion, nuclear ef 65%)  . Hypogonadism in male   . Macular  degeneration, right eye   . Mixed hyperlipidemia   . OA (osteoarthritis)   . Other spondylosis with radiculopathy, cervical region   . Parkinson's disease ([facility] 08/2020   neurologist--- dr tat--  newly dx 02/ 2022  . Sinusitis, acute 08/2020   per pt started antibiotic on 09-20-2020, has cough nasal congestion, cough, no fever  . Sting of hornets, wasps, and bees causing poisoning and toxic reactions   . Vitamin D deficiency   . Wears glasses     Past Surgical History:  Procedure Laterality Date  . ANKLE SURGERY Right 10-04-2015  _0    debridement achilles tendon & reattachment and heel excision osteophyte  . CATARACT EXTRACTION W/ INTRAOCULAR LENS  IMPLANT, BILATERAL  yrs ago  . CYSTOSCOPY WITH INSERTION OF UROLIFT  2019 approx.  .Marland KitchenSHOULDER SURGERY Right 1990's  . TONSILLECTOMY  age 80 . TRANSURETHRAL RESECTION OF PROSTATE N/A 09/23/2020   Procedure: TRANSURETHRAL RESECTION OF THE PROSTATE (TURP);  Surgeon: DFranchot Gallo MD;  [facility]  Service: Urology;  Laterality: N/A;  75 MINS    There were no vitals filed for this visit.   Subjective Assessment - 11/25/20 1235    Subjective Did all the exercises since I came in last.    Pertinent History macular degeneration with difficulty with depth perception  Patient Stated Goals Pt's goal for therapy is to do everything he wants to do, keep strength up.    Currently in Pain? No/denies                  Pt performs PWR! Moves in seated position x 10 reps   PWR! Up for improved posture  PWR! Rock for improved weighshifting (needs reminder cues for technique)  PWR! Twist for improved trunk rotation-(needs occasional cues to stop in middle, doing better using foot to pivot for improved reach to clap)  PWR! Step for improved step initiation-cues for double leg step out and in (used obstacle to step over, last 5 reps each side)  Cues provided for increased amplitude and intensity of  movements   Pt performs PWR! Moves in standing position x 20 reps   PWR! Up for improved posture  PWR! Rock for improved weighshifting  PWR! Twist for improved trunk rotation-cues to stop in midline and use foot to pivot for improved flexibility  PWR! Step for improved step initiation-cues for foot clearance and coordinated UE positions  Cues provided for increased amplitude, intensity-cues throughout for widened BOS             [facility] Adult PT Treatment/Exercise - 11/25/20 0001      Ambulation/Gait   Ambulation/Gait Yes    Ambulation/Gait Assistance 5: Supervision    Ambulation/Gait Assistance Details Gait indoors with cues for increased step length, tactile cues for arm swing/trunk rotation.    Ambulation Distance (Feet) 400 Feet   345   Assistive device None    Gait Pattern Step-through pattern;Decreased arm swing - right;Decreased arm swing - left;Decreased step length - right;Decreased step length - left;Decreased trunk rotation    Ambulation Surface Level;Indoor    Gait Comments Additional short distance gait with work for coordinated arms/leg movement:  SLOW, BIG gait with step/stop with tactile cues to assist with opposite arm/leg coordination.  Performed 10 ft x 4 reps with less tactile assistance with repetition.  Then transitioned to gait with PT having to go back to tactile cues at shoulders for relaxed trunk motion and reciprocal arm swing.  Pt very much wants to go with ipsalateral arm swing.      Self-Care   Self-Care Other Self-Care Comments    Other Self-Care Comments  Based on pt's questions regarding Parkinson's diagnosis.  Explained rationale of Parkinson's disease-symptoms, loss of dopamine, how it is diagnosed. Discussed onlinea nd Engineer, manufacturing.  Also discussed occupational therapy, as pt is reporting difficulty with handwriting, with tying his fishing hooks; discussed benefits of OT; also performed basic PWR! Hands motions  and explained rationale for these.      Neuro Re-ed    Neuro Re-ed Details  for balance/NMR: Marching in place x 10 reps with tactile/visual cue of Boomwhacker for increased step height.  Standing in place with arm swing, then marching with opposite arm to opposite knee (pt with tactile cues to complete correctly).                  PT Education - 11/25/20 1331    Education Details See self-care    Person(s) Educated Patient    Methods Explanation    Comprehension Verbalized understanding            PT Short Term Goals - 11/16/20 1051      PT SHORT TERM GOAL #1   Title Pt will perform HEP with family supervision for improved strength, balance, transfers,  and gait.  TARGET 11/12/2020    Baseline pt reports intermittently performing his exercises at home - did not have time to formally review entirety of exercise program.    Time 4    Period Weeks    Status Partially Met      PT SHORT TERM GOAL #2   Title MiniBESTest to be completed, with goal to be written as appropriate.    Baseline Mini-Best 24/28 on 10/22/20. SLS goal written in LTG    Time 4    Period Weeks    Status Achieved      PT SHORT TERM GOAL #3   Title 6MWT to be assessed, with goal to be written as appropriate.    Baseline 6MWT performed completing 1035' on 10/22/20. LTG written    Time 4    Period Weeks    Status Achieved      PT SHORT TERM GOAL #4   Title Pt will improve TUG score to less than or equal to 13.5 sec for decreased fall risk.    Baseline 9 seconds on 11/16/20    Time 4    Period Weeks    Status Achieved      PT SHORT TERM GOAL #5   Title Pt will verbalize understanding of fall prevention in home environment.    Baseline provided handout to patient and reviewed with pt's spouse at end of session.    Time 4    Period Weeks    Status Achieved             PT Long Term Goals - 10/22/20 1537      PT LONG TERM GOAL #1   Title Pt will perform HEP with family supervision for improved  strength, balance, transfers, and gait.  TARGET 12/10/20    Time 8    Period Weeks    Status New      PT LONG TERM GOAL #2   Title Pt will improve TUG/TUG cognitive score to less than or equal to 10% difference, for improved dual tasking with gait.    Time 8    Period Weeks    Status New      PT LONG TERM GOAL #3   Title Pt will improve gait velocity to at least 2.8 ft/sec for improved efficiency and safety with community gait.    Time 8    Period Weeks    Status New      PT LONG TERM GOAL #4   Title Pt will ambulate at least 1000 ft, indoor and outdoor surfaces, mod independnetly for improved community and outdoor gait safety.    Time 8    Period Weeks    Status New      PT LONG TERM GOAL #5   Title Pt will increase SLS time to >20 sec each leg for improved balance.    Baseline <20 sec when performed in mini-Best    Time 8    Period Weeks    Status New      Additional Long Term Goals   Additional Long Term Goals Yes      PT LONG TERM GOAL #6   Title Pt will increase 6 min walk distance to >1235' for improved community mobility.    Baseline 10/22/20 1035'    Time [place] - 11/25/20 1332  Clinical Impression Statement 10th Visit Progress Note, covering dates from 10/19/2020-11/25/2020.  Subjective reports:  pt feels he may be moving slightly better with progression to full dose of Sinemet, and he has not any any allergic reactions this time.  Objective reports:  recent TUG score 9 seconds (improved from 16.56); MiniBEST 24/28 and 6MWT 1035 ft.  Pt has forward posture, decreased bilateral arm swing with gait; with attempts at reciprocal arm swing, he has difficulty maintaining this pattern and reverts to ipsalateral arm swing or no arm swing.  STGs have been assessed, with pt meeting 4 of 5 STGs.  He is performing seated and standing PWR! Moves at home, but he needs cues and occasional assist for correct technique.  He will  continue to benefit from skilled PT to further address posture, balance, and gait for overall improved functional mobility.  Plan to work towards [facility] for improved mobility and decreased fall risk.    Personal Factors and Comorbidities Comorbidity 3+    Comorbidities PMH:  chronic back pain, DDD, fatigue, GERD, glaucoma, hx of syncope, OA,    Examination-Activity Limitations Locomotion Level;Transfers;Stairs    Examination-Participation Restrictions Community Activity    Stability/Clinical Decision Making Evolving/Moderate complexity    Rehab Potential Good    PT Frequency 2x / week    PT Duration 8 weeks   including eval week   PT Treatment/Interventions ADLs/Self Care Home Management;Gait training;Stair training;Functional mobility training;Therapeutic activities;Therapeutic exercise;Balance training;Neuromuscular re-education;DME Instruction;Patient/family education    PT Next Visit Plan Start session with PWR moves; consider treadmill gait; continue to work on activities to promote upper and lower body coordiantion.  SciFit, gait training for arm swing/step length.  Ask pt about OT again (will need to request order from Dr. Carles Collet if he's agreeable); functional strengthening; work towards [place].    PT Home Exercise Plan seated PWR moves. Access Code: [email or web address]    Consulted and Agree with Plan of Care Patient    Family Member Consulted daughter           Patient will benefit from skilled therapeutic intervention in order to improve the following deficits and impairments:  Abnormal gait,Difficulty walking,Decreased balance,Decreased mobility,Decreased strength,Postural dysfunction  Visit Diagnosis: Unsteadiness on feet  Other abnormalities of gait and mobility  Other symptoms and signs involving the nervous system  Abnormal posture     Problem List Patient Active Problem List   Diagnosis Date Noted  . Seizure-like activity ([place]) 09/29/2020  . Rash 09/29/2020  . Parkinson's disease  ([place]) 09/29/2020  . AKI (acute kidney injury) ([place]) 09/29/2020  . Leukocytosis 09/29/2020  . BPH with obstruction/lower urinary tract symptoms 09/23/2020  . Pain in unspecified shoulder 12/18/2019  . 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/2019  . Unspecified hemorrhoids 12/18/2019  . Renal stone 12/18/2019  . Vitamin D deficiency, unspecified 12/18/2019  . Male erectile dysfunction 12/18/2019  . Osteoarthritis of knee, unspecified 12/18/2019  . Impaired fasting glucose 12/18/2019  . Gastroesophageal reflux disease without esophagitis 12/18/2019  . Essential hypertension 12/18/2019  . Sebaceous cyst 12/18/2019  . Mixed hyperlipidemia 12/18/2019  . Fatigue 12/18/2019  . Pain in hand 12/18/2019  . Onychomycosis 12/18/2019  . Other spondylosis with radiculopathy, cervical region 12/18/2019  . BPH with urinary obstruction 12/18/2019  . Toxic effect of venom of wasps, accidental (unintentional), initial encounter 12/18/2019  . Medication management 12/18/2019  . Right flank pain 12/18/2019  . Memory loss 12/18/2019  . Family history of hemochromatosis 12/18/2019  . Pure hypercholesterolemia 12/18/2019  . Hyperglycemia 12/18/2019  , W. 11/25/2020, 1:40 PM  Frazier Butt., PT   [facility] [address] [place], [place], [address] Phone: [phone]   Fax:  [phone]  Name: Seth Carter MRN: [id number] Date of Birth: July 03, 1941

## 2020-11-26 NOTE — Telephone Encounter (Signed)
Pt states he did discontinued the famotidine it has been almost two weeks, he has had no issues and is awaiting further instructions on what to do next.

## 2020-11-29 ENCOUNTER — Encounter: Payer: Self-pay | Admitting: Physical Therapy

## 2020-11-29 ENCOUNTER — Ambulatory Visit: Payer: Medicare Other | Admitting: Physical Therapy

## 2020-11-29 ENCOUNTER — Other Ambulatory Visit: Payer: Self-pay

## 2020-11-29 DIAGNOSIS — R2681 Unsteadiness on feet: Secondary | ICD-10-CM

## 2020-11-29 DIAGNOSIS — R29818 Other symptoms and signs involving the nervous system: Secondary | ICD-10-CM

## 2020-11-29 DIAGNOSIS — R2689 Other abnormalities of gait and mobility: Secondary | ICD-10-CM

## 2020-11-29 DIAGNOSIS — R293 Abnormal posture: Secondary | ICD-10-CM | POA: Diagnosis not present

## 2020-11-29 NOTE — Therapy (Signed)
[facility] [address] [place], [place], [address] Phone: [phone]   Fax:  [phone]  Physical Therapy Treatment  Patient Details  Name: Seth Carter MRN: [id number] Date of Birth: 10-06-1940 Referring Provider (PT): Alonza Bogus, DO   Encounter Date: 11/29/2020   PT End of Session - 11/29/20 1229    Visit Number 11    Number of Visits 17    Date for PT Re-Evaluation 01/18/21    Authorization Type Medicare and UHC-Follow Medicare guidelines    Progress Note Due on Visit 10    PT Start Time 1231    PT Stop Time 1315    PT Time Calculation (min) 44 min    Activity Tolerance Patient tolerated treatment well    Behavior During Therapy [facility] for tasks assessed/performed           Past Medical History:  Diagnosis Date  . Allergic rhinitis, seasonal   . B12 deficiency   . BPH with obstruction/lower urinary tract symptoms   . Bradykinesia    neruologist--- dr tat  . Chronic back pain   . Chronically dry eyes   . DDD (degenerative disc disease), lumbar   . ED (erectile dysfunction)   . Essential hypertension    nuclear test in epic 01-16-2020 normal perfusion no ischemia, normal lvf and wall motion, nuclear ef 65%  . Family history of hemochromatosis   . Fatigue   . Generalized weakness   . GERD (gastroesophageal reflux disease)   . Glaucoma, both eyes   . Hemorrhoids   . History of chronic sinusitis   . History of kidney stones   . History of syncope    cardiology evaluation by dr Marlou Porch, note in epic 12-26-2019, work-up includes event monitor 02-04-2020( showed SR with occ PAC & PVC rare PAT)/ echo  12-26-2019 (mild LVH, ef 50-55%, moderate MR, mild to moderate AV sclerosis no stenosis, ascending aorta 11mm) and nuclear test 01-16-2020 (normal perfusion w/ normal LV and wall motion, nuclear ef 65%)  . Hypogonadism in male   . Macular degeneration, right eye   . Mixed hyperlipidemia   . OA (osteoarthritis)    . Other spondylosis with radiculopathy, cervical region   . Parkinson's disease [facility]) 08/2020   neurologist--- dr tat--  newly dx 02/ 2022  . Sinusitis, acute 08/2020   per pt started antibiotic on 09-20-2020, has cough nasal congestion, cough, no fever  . Sting of hornets, wasps, and bees causing poisoning and toxic reactions   . Vitamin D deficiency   . Wears glasses     Past Surgical History:  Procedure Laterality Date  . ANKLE SURGERY Right 10-04-2015  $[email or web address]'@[id number]$    debridement achilles tendon & reattachment and heel excision osteophyte  . CATARACT EXTRACTION W/ INTRAOCULAR LENS  IMPLANT, BILATERAL  yrs ago  . CYSTOSCOPY WITH INSERTION OF UROLIFT  2019 approx.  Marland Kitchen SHOULDER SURGERY Right 1990's  . TONSILLECTOMY  age 26  . TRANSURETHRAL RESECTION OF PROSTATE N/A 09/23/2020   Procedure: TRANSURETHRAL RESECTION OF THE PROSTATE (TURP);  Surgeon: [name], MD;  Location: [facility];  Service: Urology;  Laterality: N/A;  75 MINS    There were no vitals filed for this visit.   Subjective Assessment - 11/29/20 1229    Subjective No changes, a little soreness with doing the exercises.  No falls or stumbles over the weekend.    Pertinent History macular degeneration with difficulty with depth perception    Patient Stated Goals  Pt's goal for therapy is to do everything he wants to do, keep strength up.    Currently in Pain? No/denies                             [facility] Adult PT Treatment/Exercise - 11/29/20 0001      Ambulation/Gait   Ambulation/Gait Yes    Ambulation/Gait Assistance 5: Supervision    Ambulation/Gait Assistance Details Gait following seated SciFit stepper, cues for keeping increased step length, pt with more relaxed arm swing today.    Ambulation Distance (Feet) 460 Feet   115   Assistive device None    Gait Pattern Step-through pattern;Decreased arm swing - right;Decreased arm swing - left;Decreased step length - right;Decreased  step length - left;Decreased trunk rotation    Ambulation Surface Level;Indoor      Knee/Hip Exercises: Aerobic   Stepper with BUE/BLE for 6 minutes at gear 2.5>3 cues for rpm between 80-85 RPE at 6-7/10, last 30 seconds incr intensity >100.  Reviewed importance of aerobic activity in regards to PD               Balance Exercises - 11/29/20 0001      Balance Exercises: Standing   SLS with Vectors Solid surface;Intermittent upper extremity assist;Limitations    SLS with Vectors Limitations Alt step taps to 6" step, then 12" step, 15 reps each.  Standing on Airex, step taps to 2 cones, then step taps to 3 cones, 10 reps each with UE support.    Standing, One Foot on a Step Eyes open;Head turns;6 inch;2 reps;Limitations    Standing, One Foot on a Step Limitations Head turns, head nods x 5, then alt UE lifts x 5    Stepping Strategy Posterior;Anterior;Foam/compliant surface;UE support;Limitations;Lateral;10 reps    Stepping Strategy Limitations On Airex, 10 reps each direction, UE support and cues for increased foot clearance.  Progressed to anterior>posterior step and weightshift x 10 reps, cues for increased step length and foot clearance.    Step Ups Forward;4 inch;UE support 2;Limitations    Step Ups Limitations Single limb forward step ups, 3 sec hold, x 10 reps each leg.    Other Standing Exercises Forward step over 2" obstacle, x 10 reps, then return to midline; then side steps over 2" obstacle and return to midline x 10 reps.  Progress to no obstacles, with multi-directional stepping.  Cues to return to midline with wide BOS for balance in midline.               PT Short Term Goals - 11/16/20 1051      PT SHORT TERM GOAL #1   Title Pt will perform HEP with family supervision for improved strength, balance, transfers, and gait.  TARGET 11/12/2020    Baseline pt reports intermittently performing his exercises at home - did not have time to formally review entirety of exercise  program.    Time 4    Period Weeks    Status Partially Met      PT SHORT TERM GOAL #2   Title MiniBESTest to be completed, with goal to be written as appropriate.    Baseline Mini-Best 24/28 on 10/22/20. SLS goal written in LTG    Time 4    Period Weeks    Status Achieved      PT SHORT TERM GOAL #3   Title 6MWT to be assessed, with goal to be written as appropriate.  Baseline 6MWT performed completing 1035' on 10/22/20. LTG written    Time 4    Period Weeks    Status Achieved      PT SHORT TERM GOAL #4   Title Pt will improve TUG score to less than or equal to 13.5 sec for decreased fall risk.    Baseline 9 seconds on 11/16/20    Time 4    Period Weeks    Status Achieved      PT SHORT TERM GOAL #5   Title Pt will verbalize understanding of fall prevention in home environment.    Baseline provided handout to patient and reviewed with pt's spouse at end of session.    Time 4    Period Weeks    Status Achieved             PT Long Term Goals - 10/22/20 1537      PT LONG TERM GOAL #1   Title Pt will perform HEP with family supervision for improved strength, balance, transfers, and gait.  TARGET 12/10/20    Time 8    Period Weeks    Status New      PT LONG TERM GOAL #2   Title Pt will improve TUG/TUG cognitive score to less than or equal to 10% difference, for improved dual tasking with gait.    Time 8    Period Weeks    Status New      PT LONG TERM GOAL #3   Title Pt will improve gait velocity to at least 2.8 ft/sec for improved efficiency and safety with community gait.    Time 8    Period Weeks    Status New      PT LONG TERM GOAL #4   Title Pt will ambulate at least 1000 ft, indoor and outdoor surfaces, mod independnetly for improved community and outdoor gait safety.    Time 8    Period Weeks    Status New      PT LONG TERM GOAL #5   Title Pt will increase SLS time to >20 sec each leg for improved balance.    Baseline <20 sec when performed in mini-Best     Time 8    Period Weeks    Status New      Additional Long Term Goals   Additional Long Term Goals Yes      PT LONG TERM GOAL #6   Title Pt will increase 6 min walk distance to >1235' for improved community mobility.    Baseline 10/22/20 1035'    Time 8    Period Weeks    Status New                 Plan - 11/29/20 1330    Clinical Impression Statement Focused skilled PT session on aerobic activity for combined upper/lower extremity strengthening as well as balance activities for improved compliant surface, SLS and dynamic balance/stepping exercises.  With step and weight shift exercises throughout session, pt needs cues to widen BOS upon return to midline to improve balance in midline stance.  He does a better job after use of SciFit stepper to relax upper extremities for more relaxed, reciprocal arm swing with gait.  He will continue to benefit from skilled PT to further address balance, posture and gait towards LTGs.    Personal Factors and Comorbidities Comorbidity 3+    Comorbidities PMH:  chronic back pain, DDD, fatigue, GERD, glaucoma, hx of syncope, OA,  Examination-Activity Limitations Locomotion Level;Transfers;Stairs    Examination-Participation Restrictions Community Activity    Stability/Clinical Decision Making Evolving/Moderate complexity    Rehab Potential Good    PT Frequency 2x / week    PT Duration 8 weeks   including eval week   PT Treatment/Interventions ADLs/Self Care Home Management;Gait training;Stair training;Functional mobility training;Therapeutic activities;Therapeutic exercise;Balance training;Neuromuscular re-education;DME Instruction;Patient/family education    PT Next Visit Plan Start session with PWR moves review; consider treadmill gait/use of SciFit for aerobic activity; continue to work on activities to promote upper and lower body coordiantion.  Gait training for arm swing/step length.  functional strengthening; work towards [place].    PT Home  Exercise Plan seated PWR moves. Access Code: [email or web address]    Recommended Other Services Asked pt again about OT eval on 11/29/20, and pt reports he does not want to pursue at this time due to multiple scheduling conflicts.    Consulted and Agree with Plan of Care Patient           Patient will benefit from skilled therapeutic intervention in order to improve the following deficits and impairments:  Abnormal gait,Difficulty walking,Decreased balance,Decreased mobility,Decreased strength,Postural dysfunction  Visit Diagnosis: Other abnormalities of gait and mobility  Unsteadiness on feet  Other symptoms and signs involving the nervous system     Problem List Patient Active Problem List   Diagnosis Date Noted  . Seizure-like activity ([place]) 09/29/2020  . Rash 09/29/2020  . Parkinson's disease ([place]) 09/29/2020  . AKI (acute kidney injury) ([place]) 09/29/2020  . Leukocytosis 09/29/2020  . BPH with obstruction/lower urinary tract symptoms 09/23/2020  . Pain in unspecified shoulder 12/18/2019  . 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/2019  . Unspecified hemorrhoids 12/18/2019  . Renal stone 12/18/2019  . Vitamin D deficiency, unspecified 12/18/2019  . Male erectile dysfunction 12/18/2019  . Osteoarthritis of knee, unspecified 12/18/2019  . Impaired fasting glucose 12/18/2019  . Gastroesophageal reflux disease without esophagitis 12/18/2019  . Essential hypertension 12/18/2019  . Sebaceous cyst 12/18/2019  . Mixed hyperlipidemia 12/18/2019  . Fatigue 12/18/2019  . Pain in hand 12/18/2019  . Onychomycosis 12/18/2019  . Other spondylosis with radiculopathy, cervical region 12/18/2019  . BPH with urinary obstruction 12/18/2019  . Toxic effect of venom of wasps, accidental (unintentional), initial encounter 12/18/2019  . Medication management 12/18/2019  . Right flank pain 12/18/2019  . Memory loss 12/18/2019  . Family history of hemochromatosis 12/18/2019  . Pure hypercholesterolemia 12/18/2019   . Hyperglycemia 12/18/2019    Chanette Demo W. 11/29/2020, 1:37 PM  Frazier Butt., PT   [facility] [address] [place], [place], [address] Phone: [phone]   Fax:  [phone]  Name: Seth Carter MRN: [id number] Date of Birth: Sep 18, 1940

## 2020-12-01 ENCOUNTER — Encounter: Payer: Self-pay | Admitting: Physical Therapy

## 2020-12-01 ENCOUNTER — Ambulatory Visit: Payer: Medicare Other | Admitting: Physical Therapy

## 2020-12-01 ENCOUNTER — Other Ambulatory Visit: Payer: Self-pay

## 2020-12-01 DIAGNOSIS — R2681 Unsteadiness on feet: Secondary | ICD-10-CM | POA: Diagnosis not present

## 2020-12-01 DIAGNOSIS — R29818 Other symptoms and signs involving the nervous system: Secondary | ICD-10-CM | POA: Diagnosis not present

## 2020-12-01 DIAGNOSIS — R293 Abnormal posture: Secondary | ICD-10-CM

## 2020-12-01 DIAGNOSIS — R2689 Other abnormalities of gait and mobility: Secondary | ICD-10-CM | POA: Diagnosis not present

## 2020-12-01 NOTE — Therapy (Signed)
[facility] [address] [place], [place], [address] Phone: [phone]   Fax:  [phone]  Physical Therapy Treatment  Patient Details  Name: Seth Carter MRN: [id number] Date of Birth: [birth year] Referring Provider (PT): Alonza Bogus, DO   Encounter Date: 12/01/2020   PT End of Session - 12/01/20 1645    Visit Number 12    Number of Visits 17    Date for PT Re-Evaluation 01/18/21    Authorization Type Medicare and UHC-Follow Medicare guidelines    Progress Note Due on Visit 10    PT Start Time 8921    PT Stop Time 1615    PT Time Calculation (min) 40 min    Activity Tolerance Patient tolerated treatment well    Behavior During Therapy [facility] for tasks assessed/performed           Past Medical History:  Diagnosis Date  . Allergic rhinitis, seasonal   . B12 deficiency   . BPH with obstruction/lower urinary tract symptoms   . Bradykinesia    neruologist--- dr tat  . Chronic back pain   . Chronically dry eyes   . DDD (degenerative disc disease), lumbar   . ED (erectile dysfunction)   . Essential hypertension    nuclear test in epic 01-16-2020 normal perfusion no ischemia, normal lvf and wall motion, nuclear ef 65%  . Family history of hemochromatosis   . Fatigue   . Generalized weakness   . GERD (gastroesophageal reflux disease)   . Glaucoma, both eyes   . Hemorrhoids   . History of chronic sinusitis   . History of kidney stones   . History of syncope    cardiology evaluation by dr Marlou Porch, note in epic 12-26-2019, work-up includes event monitor 02-04-2020( showed SR with occ PAC & PVC rare PAT)/ echo  12-26-2019 (mild LVH, ef 50-55%, moderate MR, mild to moderate AV sclerosis no stenosis, ascending aorta 43m) and nuclear test 01-16-2020 (normal perfusion w/ normal LV and wall motion, nuclear ef 65%)  . Hypogonadism in male   . Macular degeneration, right eye   . Mixed hyperlipidemia   . OA (osteoarthritis)    . Other spondylosis with radiculopathy, cervical region   . Parkinson's disease ([facility] 08/2020   neurologist--- dr tat--  newly dx 02/ 2022  . Sinusitis, acute 08/2020   per pt started antibiotic on 09-20-2020, has cough nasal congestion, cough, no fever  . Sting of hornets, wasps, and bees causing poisoning and toxic reactions   . Vitamin D deficiency   . Wears glasses     Past Surgical History:  Procedure Laterality Date  . ANKLE SURGERY Right 10-04-2015  _0    debridement achilles tendon & reattachment and heel excision osteophyte  . CATARACT EXTRACTION W/ INTRAOCULAR LENS  IMPLANT, BILATERAL  yrs ago  . CYSTOSCOPY WITH INSERTION OF UROLIFT  2019 approx.  .Marland KitchenSHOULDER SURGERY Right 1990's  . TONSILLECTOMY  age 80 . TRANSURETHRAL RESECTION OF PROSTATE N/A 09/23/2020   Procedure: TRANSURETHRAL RESECTION OF THE PROSTATE (TURP);  Surgeon: DFranchot Gallo MD;  [facility]  Service: Urology;  Laterality: N/A;  75 MINS    There were no vitals filed for this visit.   Subjective Assessment - 12/01/20 1538    Subjective No falls, doing the exercises at home.    Pertinent History macular degeneration with difficulty with depth perception    Patient Stated Goals Pt's goal for therapy is to do everything he wants  to do, keep strength up.    Currently in Pain? No/denies                             [facility] Adult PT Treatment/Exercise - 12/01/20 1541      Ambulation/Gait   Ambulation/Gait Yes    Ambulation/Gait Assistance 5: Supervision    Ambulation/Gait Assistance Details cues for step length and relaxing BUE, with pt with improved arm swing    Assistive device None    Gait Pattern Step-through pattern;Decreased arm swing - right;Decreased arm swing - left;Decreased step length - right;Decreased step length - left;Decreased trunk rotation    Ambulation Surface Level;Indoor      Knee/Hip Exercises: Aerobic   Stepper with BUE/BLE for 6  minutes at gear 3 cues for rpm between 85-90, RPE at 6-7/10, one bout of 30 seconds for rpm above 100. reviewed intensity levels in regards to aerobic exercise    Other Aerobic pt reports that he will be getting a seated peddle bike for home - discussed level of intensity with exercise at home. with pt on bike, asked if pt had a gym membership to use for reciprocal BUE/BLE movement. pt reports that he has a gym pretty close to him that he will possibly check out            Pt performs PWR! Moves inseatedposition x10reps  PWR! Up for improved posture  PWR! Rock for improved weighshifting (needs reminder cues for technique)  PWR! Twist for improved trunk rotation-(needs occasional cues to stop in middle for tall posture and to use foot to help pivot when turning for improved reach when clapping)  PWR! Step for improved step initiation-performed as single step out and back in  Cues provided forincreased amplitude and intensity of movements   Pt performs PWR! Moves instandingposition x20 reps  PWR! Up for improved posture  PWR! Rock for improved weighshifting - initial cues for technique   PWR! Twist for improved trunk rotation-cues to stop in midline and use foot to pivot for improved flexibility  PWR! Step for improved step initiation-cues for foot clearance  Cues provided forincreased amplitude, intensity-cues throughout for widened BOS    Balance Exercises - 12/01/20 0001      Balance Exercises: Standing   Marching Solid surface;Limitations    Marching Limitations forwards marching at countertop with boomwhacker initially as visual cue on how high to lift up leg x2 reps, then x3 reps down and back with marching and alternating UE lift, with verbal/demo cues for technique, pt with incr difficulty sequencing at times               PT Short Term Goals - 11/16/20 1051      PT SHORT TERM GOAL #1   Title Pt will perform HEP with family supervision for  improved strength, balance, transfers, and gait.  TARGET 11/12/2020    Baseline pt reports intermittently performing his exercises at home - did not have time to formally review entirety of exercise program.    Time 4    Period Weeks    Status Partially Met      PT SHORT TERM GOAL #2   Title MiniBESTest to be completed, with goal to be written as appropriate.    Baseline Mini-Best 24/28 on 10/22/20. SLS goal written in LTG    Time 4    Period Weeks    Status Achieved      PT SHORT TERM  GOAL #3   Title 6MWT to be assessed, with goal to be written as appropriate.    Baseline 6MWT performed completing 1035' on 10/22/20. LTG written    Time 4    Period Weeks    Status Achieved      PT SHORT TERM GOAL #4   Title Pt will improve TUG score to less than or equal to 13.5 sec for decreased fall risk.    Baseline 9 seconds on 11/16/20    Time 4    Period Weeks    Status Achieved      PT SHORT TERM GOAL #5   Title Pt will verbalize understanding of fall prevention in home environment.    Baseline provided handout to patient and reviewed with pt's spouse at end of session.    Time 4    Period Weeks    Status Achieved             PT Long Term Goals - 10/22/20 1537      PT LONG TERM GOAL #1   Title Pt will perform HEP with family supervision for improved strength, balance, transfers, and gait.  TARGET 12/10/20    Time 8    Period Weeks    Status New      PT LONG TERM GOAL #2   Title Pt will improve TUG/TUG cognitive score to less than or equal to 10% difference, for improved dual tasking with gait.    Time 8    Period Weeks    Status New      PT LONG TERM GOAL #3   Title Pt will improve gait velocity to at least 2.8 ft/sec for improved efficiency and safety with community gait.    Time 8    Period Weeks    Status New      PT LONG TERM GOAL #4   Title Pt will ambulate at least 1000 ft, indoor and outdoor surfaces, mod independnetly for improved community and outdoor gait safety.     Time 8    Period Weeks    Status New      PT LONG TERM GOAL #5   Title Pt will increase SLS time to >20 sec each leg for improved balance.    Baseline <20 sec when performed in mini-Best    Time 8    Period Weeks    Status New      Additional Long Term Goals   Additional Long Term Goals Yes      PT LONG TERM GOAL #6   Title Pt will increase 6 min walk distance to >1235' for improved community mobility.    Baseline 10/22/20 1035'    Time 8    Period Weeks    Status New                 Plan - 12/01/20 1654    Clinical Impression Statement Continued to use SciFit stepper at start of session today. Pt with improved gait with larger step length and when relaxing BUE for improved reciprocal arm swing. Reviewed standing/seated PWR moves from pt's HEP - pt needing initial cues at times for technique and for making sure that pt has a wider BOS when performing. Will continue to progress towards LTGs.    Personal Factors and Comorbidities Comorbidity 3+    Comorbidities PMH:  chronic back pain, DDD, fatigue, GERD, glaucoma, hx of syncope, OA,    Examination-Activity Limitations Locomotion Level;Transfers;Stairs    Examination-Participation Restrictions Community Activity  Stability/Clinical Decision Making Evolving/Moderate complexity    Rehab Potential Good    PT Frequency 2x / week    PT Duration 8 weeks   including eval week   PT Treatment/Interventions ADLs/Self Care Home Management;Gait training;Stair training;Functional mobility training;Therapeutic activities;Therapeutic exercise;Balance training;Neuromuscular re-education;DME Instruction;Patient/family education    PT Next Visit Plan consider treadmill gait/use of SciFit for aerobic activity; continue to work on activities to promote upper and lower body coordiantion.  Gait training for arm swing/step length.  functional strengthening; work towards [place].    PT Home Exercise Plan seated PWR moves. Access Code: [email or web address]     Consulted and Agree with Plan of Care Patient           Patient will benefit from skilled therapeutic intervention in order to improve the following deficits and impairments:  Abnormal gait,Difficulty walking,Decreased balance,Decreased mobility,Decreased strength,Postural dysfunction  Visit Diagnosis: Other abnormalities of gait and mobility  Unsteadiness on feet  Other symptoms and signs involving the nervous system  Abnormal posture     Problem List Patient Active Problem List   Diagnosis Date Noted  . Seizure-like activity ([place]) 09/29/2020  . Rash 09/29/2020  . Parkinson's disease ([place]) 09/29/2020  . AKI (acute kidney injury) ([place]) 09/29/2020  . Leukocytosis 09/29/2020  . BPH with obstruction/lower urinary tract symptoms 09/23/2020  . Pain in unspecified shoulder 12/18/2019  . 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/2019  . Unspecified hemorrhoids 12/18/2019  . Renal stone 12/18/2019  . Vitamin D deficiency, unspecified 12/18/2019  . Male erectile dysfunction 12/18/2019  . Osteoarthritis of knee, unspecified 12/18/2019  . Impaired fasting glucose 12/18/2019  . Gastroesophageal reflux disease without esophagitis 12/18/2019  . Essential hypertension 12/18/2019  . Sebaceous cyst 12/18/2019  . Mixed hyperlipidemia 12/18/2019  . Fatigue 12/18/2019  . Pain in hand 12/18/2019  . Onychomycosis 12/18/2019  . Other spondylosis with radiculopathy, cervical region 12/18/2019  . BPH with urinary obstruction 12/18/2019  . Toxic effect of venom of wasps, accidental (unintentional), initial encounter 12/18/2019  . Medication management 12/18/2019  . Right flank pain 12/18/2019  . Memory loss 12/18/2019  . Family history of hemochromatosis 12/18/2019  . Pure hypercholesterolemia 12/18/2019  . Hyperglycemia 12/18/2019    Arliss Journey, PT, DPT  12/01/2020, 4:56 PM  [facility] [address] [place], [place],  [address] Phone: [phone]   Fax:  [phone]  Name: Seth Carter MRN: [id number] Date of Birth: 07/19/1941

## 2020-12-06 ENCOUNTER — Ambulatory Visit: Payer: Medicare Other | Admitting: Physical Therapy

## 2020-12-06 ENCOUNTER — Encounter (INDEPENDENT_AMBULATORY_CARE_PROVIDER_SITE_OTHER): Payer: Medicare Other | Admitting: Ophthalmology

## 2020-12-06 ENCOUNTER — Other Ambulatory Visit: Payer: Self-pay

## 2020-12-06 DIAGNOSIS — H35033 Hypertensive retinopathy, bilateral: Secondary | ICD-10-CM

## 2020-12-06 DIAGNOSIS — H43813 Vitreous degeneration, bilateral: Secondary | ICD-10-CM | POA: Diagnosis not present

## 2020-12-06 DIAGNOSIS — I10 Essential (primary) hypertension: Secondary | ICD-10-CM | POA: Diagnosis not present

## 2020-12-06 DIAGNOSIS — D3131 Benign neoplasm of right choroid: Secondary | ICD-10-CM

## 2020-12-06 DIAGNOSIS — H33302 Unspecified retinal break, left eye: Secondary | ICD-10-CM

## 2020-12-06 DIAGNOSIS — H353132 Nonexudative age-related macular degeneration, bilateral, intermediate dry stage: Secondary | ICD-10-CM

## 2020-12-06 DIAGNOSIS — H34832 Tributary (branch) retinal vein occlusion, left eye, with macular edema: Secondary | ICD-10-CM

## 2020-12-08 ENCOUNTER — Ambulatory Visit: Payer: Medicare Other | Admitting: Physical Therapy

## 2020-12-13 ENCOUNTER — Other Ambulatory Visit: Payer: Self-pay

## 2020-12-13 ENCOUNTER — Encounter: Payer: Self-pay | Admitting: Physical Therapy

## 2020-12-13 ENCOUNTER — Ambulatory Visit: Payer: Medicare Other | Admitting: Physical Therapy

## 2020-12-13 DIAGNOSIS — R293 Abnormal posture: Secondary | ICD-10-CM

## 2020-12-13 DIAGNOSIS — R2689 Other abnormalities of gait and mobility: Secondary | ICD-10-CM | POA: Diagnosis not present

## 2020-12-13 DIAGNOSIS — R29818 Other symptoms and signs involving the nervous system: Secondary | ICD-10-CM | POA: Diagnosis not present

## 2020-12-13 DIAGNOSIS — R2681 Unsteadiness on feet: Secondary | ICD-10-CM

## 2020-12-13 NOTE — Therapy (Signed)
[facility] [address] [place], [place], [address] Phone: [phone]   Fax:  [phone]  Physical Therapy Treatment  Patient Details  Name: Seth Carter MRN: [id number] Date of Birth: 1941/06/06 Referring Provider (PT): Alonza Bogus, DO   Encounter Date: 12/13/2020   PT End of Session - 12/13/20 1626    Visit Number 13    Number of Visits 17    Date for PT Re-Evaluation 01/18/21    Authorization Type Medicare and UHC-Follow Medicare guidelines    Progress Note Due on Visit 10    PT Start Time 1532    PT Stop Time 1615    PT Time Calculation (min) 43 min    Activity Tolerance Patient tolerated treatment well    Behavior During Therapy [facility] for tasks assessed/performed           Past Medical History:  Diagnosis Date  . Allergic rhinitis, seasonal   . B12 deficiency   . BPH with obstruction/lower urinary tract symptoms   . Bradykinesia    neruologist--- dr tat  . Chronic back pain   . Chronically dry eyes   . DDD (degenerative disc disease), lumbar   . ED (erectile dysfunction)   . Essential hypertension    nuclear test in epic 01-16-2020 normal perfusion no ischemia, normal lvf and wall motion, nuclear ef 65%  . Family history of hemochromatosis   . Fatigue   . Generalized weakness   . GERD (gastroesophageal reflux disease)   . Glaucoma, both eyes   . Hemorrhoids   . History of chronic sinusitis   . History of kidney stones   . History of syncope    cardiology evaluation by dr Marlou Porch, note in epic 12-26-2019, work-up includes event monitor 02-04-2020( showed SR with occ PAC & PVC rare PAT)/ echo  12-26-2019 (mild LVH, ef 50-55%, moderate MR, mild to moderate AV sclerosis no stenosis, ascending aorta 46mm) and nuclear test 01-16-2020 (normal perfusion w/ normal LV and wall motion, nuclear ef 65%)  . Hypogonadism in male   . Macular degeneration, right eye   . Mixed hyperlipidemia   . OA (osteoarthritis)    . Other spondylosis with radiculopathy, cervical region   . Parkinson's disease [facility]) 08/2020   neurologist--- dr tat--  newly dx 02/ 2022  . Sinusitis, acute 08/2020   per pt started antibiotic on 09-20-2020, has cough nasal congestion, cough, no fever  . Sting of hornets, wasps, and bees causing poisoning and toxic reactions   . Vitamin D deficiency   . Wears glasses     Past Surgical History:  Procedure Laterality Date  . ANKLE SURGERY Right 10-04-2015  $[email or web address]'@[email or web address]$    debridement achilles tendon & reattachment and heel excision osteophyte  . CATARACT EXTRACTION W/ INTRAOCULAR LENS  IMPLANT, BILATERAL  yrs ago  . CYSTOSCOPY WITH INSERTION OF UROLIFT  2019 approx.  [facility] Kitchen SHOULDER SURGERY Right 1990's  . TONSILLECTOMY  age 25  . TRANSURETHRAL RESECTION OF PROSTATE N/A 09/23/2020   Procedure: TRANSURETHRAL RESECTION OF THE PROSTATE (TURP);  Surgeon: [name], MD;  Location: [facility];  Service: Urology;  Laterality: N/A;  75 MINS    There were no vitals filed for this visit.   Subjective Assessment - 12/13/20 1538    Subjective [place] the beach for [place], asking about different stationary bikes (brought in pictures)    Pertinent History macular degeneration with difficulty with depth perception    Patient Stated Goals Pt's goal  for therapy is to do everything he wants to do, keep strength up.    Currently in Pain? No/denies              [facility] PT Assessment - 12/13/20 1556      Timed Up and Go Test   Normal TUG (seconds) 8.68    Cognitive TUG (seconds) 14.15                         [facility] Adult PT Treatment/Exercise - 12/13/20 1556      Ambulation/Gait   Ambulation/Gait Yes    Ambulation/Gait Assistance 5: Supervision    Ambulation/Gait Assistance Details pt able to maintain incr step length throughout, has more relaxed arm swing    Ambulation Distance (Feet) 1313 Feet   during 6MWT   Assistive device None    Gait Pattern Step-through  pattern;Decreased arm swing - right;Decreased arm swing - left;Decreased step length - right;Decreased step length - left;Decreased trunk rotation    Ambulation Surface Level;Indoor    Gait velocity 9.06 seconds = 3.62 ft/sec      Therapeutic Activites    Therapeutic Activities Other Therapeutic Activities    Other Therapeutic Activities discussed what stationary recumbent bike would be best for pt to use at home (pt bringing in 2 pictures of options) - one that has the seat lower to the ground - stated that this would be easier to get in and out of compared to the seat that is up higher. Also discussed POC going forwards and progress towards goals, will plan to D/C at the end of scheduled appts (next week) as pt would like a break from PT during the summer and wants to spend more time at the beach, discussed will schedule up return PD screens in 6-9 months for all 3 disciplines and then from there can determine if evals are warranted, pt in agreement with plan.      Neuro Re-ed    Neuro Re-ed Details  alternating forward stepping with extending arms out - cues for UE placement and step height, alternating lateral stepping over 4" obstacle and raising contralateral arm overhead x10 reps B, then adding in cognitive challenge with naming foods from A-Z, pt with incr difficulty with cog challenge and performs more slowly.                  PT Education - 12/13/20 1626    Education Details see TA    Person(s) Educated Patient    Methods Explanation    Comprehension Verbalized understanding            PT Short Term Goals - 11/16/20 1051      PT SHORT TERM GOAL #1   Title Pt will perform HEP with family supervision for improved strength, balance, transfers, and gait.  TARGET 11/12/2020    Baseline pt reports intermittently performing his exercises at home - did not have time to formally review entirety of exercise program.    Time 4    Period Weeks    Status Partially Met      PT  SHORT TERM GOAL #2   Title MiniBESTest to be completed, with goal to be written as appropriate.    Baseline Mini-Best 24/28 on 10/22/20. SLS goal written in LTG    Time 4    Period Weeks    Status Achieved      PT SHORT TERM GOAL #3   Title 6MWT to be assessed, with  goal to be written as appropriate.    Baseline 6MWT performed completing 1035' on 10/22/20. LTG written    Time 4    Period Weeks    Status Achieved      PT SHORT TERM GOAL #4   Title Pt will improve TUG score to less than or equal to 13.5 sec for decreased fall risk.    Baseline 9 seconds on 11/16/20    Time 4    Period Weeks    Status Achieved      PT SHORT TERM GOAL #5   Title Pt will verbalize understanding of fall prevention in home environment.    Baseline provided handout to patient and reviewed with pt's spouse at end of session.    Time 4    Period Weeks    Status Achieved             PT Long Term Goals - 12/13/20 1554      PT LONG TERM GOAL #1   Title Pt will perform HEP with family supervision for improved strength, balance, transfers, and gait.  TARGET 12/10/20    Time 8    Period Weeks    Status New      PT LONG TERM GOAL #2   Title Pt will improve TUG/TUG cognitive score to less than or equal to 10% difference, for improved dual tasking with gait.    Baseline 8.68 TUG, 14.15 cog TUG.    Time 8    Period Weeks    Status Not Met      PT LONG TERM GOAL #3   Title Pt will improve gait velocity to at least 2.8 ft/sec for improved efficiency and safety with community gait.    Baseline 9.06 seconds = 3.62 ft/sec    Time 8    Period Weeks    Status Achieved      PT LONG TERM GOAL #4   Title Pt will ambulate at least 1000 ft, indoor and outdoor surfaces, mod independnetly for improved community and outdoor gait safety.    Time 8    Period Weeks    Status New      PT LONG TERM GOAL #5   Title Pt will increase SLS time to >20 sec each leg for improved balance.    Baseline <20 sec when performed  in mini-Best    Time 8    Period Weeks    Status New      PT LONG TERM GOAL #6   Title Pt will increase 6 min walk distance to >1235' for improved community mobility.    Baseline 10/22/20 1035', performed 1313' on 12/13/20    Time 8    Period Weeks    Status Achieved               Plan - 12/13/20 1632    Clinical Impression Statement Began to assess pt's LTGs today. Pt met LTG #3 and #6. Pt improved his gait speed to 3.62 ft/sec. With 6MWT, pt increased his distance to 1313'  (previously 1035'), pt able to consistently take longer strides and also demonstrates more relaxed arm swing. Pt did not meet LTG in regards to cog dual tasking, pt with improvement of cog TUG to 14.15 seconds, but not quite to goal level. Due to progress towards goals, will plan to D/C at end of scheduled appts. Will check remaining of LTGs at next session and begin to finalize pt's HEP and community fitness plan    Personal  Factors and Comorbidities Comorbidity 3+    Comorbidities PMH:  chronic back pain, DDD, fatigue, GERD, glaucoma, hx of syncope, OA,    Examination-Activity Limitations Locomotion Level;Transfers;Stairs    Examination-Participation Restrictions Community Activity    Stability/Clinical Decision Making Evolving/Moderate complexity    Rehab Potential Good    PT Frequency 2x / week    PT Duration 8 weeks   including eval week   PT Treatment/Interventions ADLs/Self Care Home Management;Gait training;Stair training;Functional mobility training;Therapeutic activities;Therapeutic exercise;Balance training;Neuromuscular re-education;DME Instruction;Patient/family education    PT Next Visit Plan check remainder of LTGs, begin to finalize HEP and community program, will need recert for 2 remaining visits next week    PT Home Exercise Plan seated PWR moves. Access Code: [email or web address]    Consulted and Agree with Plan of Care Patient           Patient will benefit from skilled therapeutic intervention in  order to improve the following deficits and impairments:  Abnormal gait,Difficulty walking,Decreased balance,Decreased mobility,Decreased strength,Postural dysfunction  Visit Diagnosis: Other abnormalities of gait and mobility  Unsteadiness on feet  Other symptoms and signs involving the nervous system  Abnormal posture     Problem List Patient Active Problem List   Diagnosis Date Noted  . Seizure-like activity ([place]) 09/29/2020  . Rash 09/29/2020  . Parkinson's disease ([place]) 09/29/2020  . AKI (acute kidney injury) ([place]) 09/29/2020  . Leukocytosis 09/29/2020  . BPH with obstruction/lower urinary tract symptoms 09/23/2020  . Pain in unspecified shoulder 12/18/2019  . 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/2019  . Unspecified hemorrhoids 12/18/2019  . Renal stone 12/18/2019  . Vitamin D deficiency, unspecified 12/18/2019  . Male erectile dysfunction 12/18/2019  . Osteoarthritis of knee, unspecified 12/18/2019  . Impaired fasting glucose 12/18/2019  . Gastroesophageal reflux disease without esophagitis 12/18/2019  . Essential hypertension 12/18/2019  . Sebaceous cyst 12/18/2019  . Mixed hyperlipidemia 12/18/2019  . Fatigue 12/18/2019  . Pain in hand 12/18/2019  . Onychomycosis 12/18/2019  . Other spondylosis with radiculopathy, cervical region 12/18/2019  . BPH with urinary obstruction 12/18/2019  . Toxic effect of venom of wasps, accidental (unintentional), initial encounter 12/18/2019  . Medication management 12/18/2019  . Right flank pain 12/18/2019  . Memory loss 12/18/2019  . Family history of hemochromatosis 12/18/2019  . Pure hypercholesterolemia 12/18/2019  . Hyperglycemia 12/18/2019    Arliss Journey, PT, DPT  12/13/2020, 4:34 PM  [facility] [address] [place], [place], [address] Phone: [phone]   Fax:  [phone]  Name: ZACHAREE GADDIE MRN: [id number] Date of Birth: [birth year]

## 2020-12-15 ENCOUNTER — Encounter: Payer: Self-pay | Admitting: Physical Therapy

## 2020-12-15 ENCOUNTER — Ambulatory Visit: Payer: Medicare Other | Admitting: Physical Therapy

## 2020-12-15 ENCOUNTER — Ambulatory Visit: Payer: Medicare Other

## 2020-12-15 ENCOUNTER — Encounter: Payer: Self-pay | Admitting: Psychology

## 2020-12-15 ENCOUNTER — Ambulatory Visit (INDEPENDENT_AMBULATORY_CARE_PROVIDER_SITE_OTHER): Payer: Medicare Other | Admitting: Psychology

## 2020-12-15 ENCOUNTER — Other Ambulatory Visit: Payer: Self-pay

## 2020-12-15 DIAGNOSIS — G3184 Mild cognitive impairment, so stated: Secondary | ICD-10-CM | POA: Diagnosis not present

## 2020-12-15 DIAGNOSIS — R4189 Other symptoms and signs involving cognitive functions and awareness: Secondary | ICD-10-CM

## 2020-12-15 DIAGNOSIS — J329 Chronic sinusitis, unspecified: Secondary | ICD-10-CM | POA: Insufficient documentation

## 2020-12-15 DIAGNOSIS — R293 Abnormal posture: Secondary | ICD-10-CM | POA: Diagnosis not present

## 2020-12-15 DIAGNOSIS — R29818 Other symptoms and signs involving the nervous system: Secondary | ICD-10-CM

## 2020-12-15 DIAGNOSIS — G20 Parkinson's disease: Secondary | ICD-10-CM

## 2020-12-15 DIAGNOSIS — J309 Allergic rhinitis, unspecified: Secondary | ICD-10-CM | POA: Insufficient documentation

## 2020-12-15 DIAGNOSIS — J029 Acute pharyngitis, unspecified: Secondary | ICD-10-CM | POA: Insufficient documentation

## 2020-12-15 DIAGNOSIS — R2681 Unsteadiness on feet: Secondary | ICD-10-CM | POA: Diagnosis not present

## 2020-12-15 DIAGNOSIS — F0670 Mild neurocognitive disorder due to known physiological condition without behavioral disturbance: Secondary | ICD-10-CM | POA: Insufficient documentation

## 2020-12-15 DIAGNOSIS — I700 Atherosclerosis of aorta: Secondary | ICD-10-CM | POA: Insufficient documentation

## 2020-12-15 DIAGNOSIS — E538 Deficiency of other specified B group vitamins: Secondary | ICD-10-CM | POA: Insufficient documentation

## 2020-12-15 DIAGNOSIS — N400 Enlarged prostate without lower urinary tract symptoms: Secondary | ICD-10-CM

## 2020-12-15 DIAGNOSIS — R2689 Other abnormalities of gait and mobility: Secondary | ICD-10-CM | POA: Diagnosis not present

## 2020-12-15 HISTORY — DX: Mild neurocognitive disorder due to known physiological condition without behavioral disturbance: F06.70

## 2020-12-15 HISTORY — DX: Parkinson's disease: G20

## 2020-12-15 HISTORY — DX: Benign prostatic hyperplasia without lower urinary tract symptoms: N40.0

## 2020-12-15 NOTE — Progress Notes (Addendum)
NEUROPSYCHOLOGICAL EVALUATION [facility] Department of Neurology  Date of Evaluation: Dec 15, 2020  Reason for Referral:   Seth Carter is a 80 y.o. right-handed Caucasian male referred by Seth Carter, D.O., to characterize his current cognitive functioning and assist with diagnostic clarity and treatment planning in the context of a new diagnosis of Parkinson's disease and concern surrounding cognitive decline.   Assessment and Plan:   Clinical Impression(s): Seth Carter's pattern of performance is suggestive of prominent impairments across processing speed, performance-based executive functions, verbal fluency, and both encoding (i.e., learning) and retrieval aspects of memory. Performance variability was exhibited across more complex attention and visuospatial abilities. Performances were generally appropriate across basic attention, safety/judgment, receptive language, confrontation naming, and consolidation/recognition aspects of memory. Surrounding ADLs, Seth Carter and his daughter noted that he has full assistance organizing and managing medications and that he will still forget to take these medications 2-3 days per week. He and his wife have continued to co-manage finances and he continues to drive without reported issue. Diagnostically, he is likely on the border between mild and major neurocognitive disorder designations. However, several instrumental ADLs are still relatively intact. He also described himself as a poor student in prior academic settings, meaning that some lower scores may in fact be within normal limits relative to his premorbid baseline. Overall, I feel he best meets criteria for a Mild Neurocognitive Disorder ("mild cognitive impairment"). However, he is likely towards the severe end of this spectrum and at risk of transitioning to a dementia designation in the future.   Regarding etiology, Seth Carter's pattern of generally mild to  moderate frontal subcortical dysfunction with added visuospatial involvement is consistent with what would be expected in Parkinson's disease. It is important to highlight that cognitive testing is not able to diagnose this condition in isolation, but rather can support other clinical findings. However, based upon the combination of Seth Carter findings and cognitive testing, cognitive dysfunction secondary to Parkinson's disease appears most likely. Given his advanced age of symptom onset and that dominant symptoms do not involve tremors, he does seem to be at an increased risk for the future transition to a Parkinson's disease dementia presentation. He did not demonstrate severe, visuospatial impairment or behavioral characteristics consistent with Lewy body dementia. There was no report of frequent falling or oculomotor dysfunction to support progressive supranuclear palsy, nor strong asymmetric parkinsonism to support corticobasal degeneration. Medical records also do not express concerns surrounding multiple systems atrophy or frontotemporal dementia. Neuroimaging did not suggest a strong vascular component and performances across memory tasks did not suggest rapid forgetting or a memory storage deficit, which is inconsistent with typically presenting Alzheimer's disease. Continued medical monitoring will be important moving forward.   Recommendations: A repeat neuropsychological evaluation in 18-24 months (or sooner if functional decline is noted) is recommended to assess the trajectory of future cognitive decline should it occur. This will also aid in future efforts towards improved diagnostic clarity.  If there are continued diagnostic uncertainties, I do feel that a DaTscan would be beneficial assuming that there are no known allergies to the contrast utilized with this scan.  Should there be further progression of his current deficits over time, Seth Carter is unlikely to regain any independent living  skills lost. Therefore, it is recommended that he remain as involved as possible in all aspects of household chores, finances, and medication management, with supervision to ensure adequate performance. He will likely benefit from the establishment  and maintenance of a routine in order to maximize his functional abilities over time.  It will be important for him to have another person with him when in situations where he may need to process information, weigh the pros and cons of different options, and make decisions, in order to ensure that he fully understands and recalls all information to be considered.  If not already done, Seth Carter and his family may want to discuss his wishes regarding durable power of attorney and medical decision making, so that he can have input into these choices. Additionally, they may wish to discuss future plans for caretaking and seek out community options for in home/residential care should they become necessary.  Admittedly, performance across neurocognitive testing is not a strong predictor of an individual's safety operating a motor vehicle. However, I do have concerns given prominent dysfunction surrounding processing speed and executive functioning, as well as some visuospatial deficits. At the very least, his family should actively monitor his driving ability so that they can intervene quickly if safety concerns emerge. Should his family wish to pursue a formalized driving evaluation, they would be encouraged to contact [employer] in [place], [place] at [phone]. Another option would be through [facility]; however, the latter would likely require a referral from a medical doctor. [facility] can be reached directly at [phone].   When learning new information, he would benefit from information being broken up into small, manageable pieces. He may also find it helpful to articulate the material in his own words and in a context to  promote encoding at the onset of a new task. This material may need to be repeated multiple times to promote encoding.  Memory can be improved using internal strategies such as rehearsal, repetition, chunking, mnemonics, association, and imagery. External strategies such as written notes in a consistently used memory journal, visual and nonverbal auditory cues such as a calendar on the refrigerator or appointments with alarm, such as on a cell phone, can also help maximize recall.    To address problems with processing speed, he may wish to consider:   -Ensuring that he is alerted when essential material or instructions are being presented   -Adjusting the speed at which new information is presented   -Allowing for more time in comprehending, processing, and responding in conversation  To address problems with fluctuating attention and executive dysfunction, he may wish to consider:   -Avoiding external distractions when needing to concentrate   -Limiting exposure to fast paced environments with multiple sensory demands   -Writing down complicated information and using checklists   -Attempting and completing one task at a time (i.e., no multi-tasking)   -Verbalizing aloud each step of a task to maintain focus   -Taking frequent breaks during the completion of steps/tasks to avoid fatigue   -Reducing the amount of information considered at one time  Review of Records:   Mr. Grounds was seen by [facility] [name], D.O.) on 12/18/2019 for an evaluation of bradykinesia and masked facies to rule out Parkinson's disease. Upon exam, Dr. Carles Collet noted tremor, postural falls, bradykinesia, micrographia, and subjective cognitive dysfunction. No visual hallucinations or REM sleep behaviors were noted. There was normal tone in the bilateral upper and lower extremities. No abnormal movements were noted. There was no decremation with RAM's. He had no difficulty arising out of a deep-seated chair without  the use of his hands. His stride length was good with decreased arm swing noted  on the right. At that time, Dr. Carles Collet noted that Mr. Wemhoff did not meet other clinical criteria for Parkinson's disease outside of bradykinesia. However, it was recommended that have a later follow-up due to concerns that he could be developing this condition.   He was again seen by Dr. Carles Collet on 09/20/2020. Changes were noted in his exam. There was now mild to moderately increased tone in his LUE, mild decremation with RAMs, and decreased stride length with decreased arm swing bilaterally. At that time, it was felt that he did meet criteria and was diagnosed with Parkinson's disease. He was started on carbidopa/levodopa 25/100 and given instructions to titrate up. He was also referred to outpatient PT.   He was admitted to the hospital on 09/23/2020 for definitive surgical management of BPH with obstruction. He underwent a TURP procedure which he tolerated well. His post-operative course was unremarkable and was discharged in stable condition on 09/28/2020.   He was again admitted to the hospital on 09/29/2020 with abdominal pain. Abdomen CT revealed a left UVJ calculus. He was also noted to have a rash on his chest and experienced a possible seizure-like episode. The latter was thought to be due to autonomic dysfunction or orthostatic hypotension in the setting of Parkinson's disease. Brain MRI was negative for any acute abnormalities and EEG was negative for seizures. A previous echo in June 2021 showed preserved EF. TED hose was applied prior to discharge. His rash was said to have fully resolved by 3/10. His family expressed concerns that it may have been an allergic reaction from Sinemet. However, this medication was said to have been resumed in the hospital and there was no evidence of rash reoccurrence. He was discharged home on 09/30/2020.   He met again with Dr. Carles Collet on 10/04/2020 for follow-up. At that time, Mr. Martine daughters  expressed a desire for him to complete a DaTscan and neuropsychological testing. The former was held due to concerns that he could be allergic to the IV contrast. Regarding the latter, Mr. Grantham was referred for a comprehensive neuropsychological evaluation to characterize his cognitive abilities and to assist with diagnostic clarity and treatment planning.   Head CT on 09/29/2020 was negative. Brain MRI on 09/29/2020 revealed mild to moderate generalized atrophy and mild small vessel ischemic changes bilaterally.   Past Medical History:  Diagnosis Date  . Acute pharyngitis   . AKI (acute kidney injury) 09/29/2020  . Allergic rhinitis   . Benign prostatic hyperplasia with lower urinary tract symptoms 09/23/2020  . Bradykinesia   . Cervical spondylosis without myelopathy 12/18/2019  . Chronic back pain   . Chronic sinusitis   . Degeneration of lumbar intervertebral disc 08/20/2020  . ED (erectile dysfunction)   . Enlarged prostate 12/15/2020  . Essential hypertension    nuclear test in epic 01-16-2020 normal perfusion no ischemia, normal lvf and wall motion, nuclear ef 65%  . Family history of hemochromatosis   . Fatigue   . Gastroesophageal reflux disease without esophagitis 12/18/2019  . Generalized weakness   . Glaucoma, both eyes   . Hardening of the aorta (main artery of the heart)   . Hemorrhoids   . History of syncope    cardiology evaluation by dr Marlou Porch, note in epic 12-26-2019, work-up includes event monitor 02-04-2020( showed SR with occ PAC & PVC rare PAT)/ echo  12-26-2019 (mild LVH, ef 50-55%, moderate MR, mild to moderate AV sclerosis no stenosis, ascending aorta 95m) and nuclear test 01-16-2020 (normal perfusion w/ normal  LV and wall motion, nuclear ef 65%)  . Hyperglycemia 12/18/2019  . Hypogonadism in male   . Leukocytosis 09/29/2020  . Macular degeneration, right eye   . Mixed hyperlipidemia   . Onychomycosis 12/18/2019  . Osteoarthritis of knee 12/18/2019  . Pain in hand  12/18/2019  . Parkinson's disease 08/2020  . Renal stone 12/18/2019  . Sebaceous cyst 12/18/2019  . Seizure-like activity 09/29/2020  . Spinal stenosis of lumbar region 08/20/2020  . 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/2019  . Toxic effect of venom of wasps 12/18/2019  . Vitamin B12 deficiency   . Vitamin D deficiency     Past Surgical History:  Procedure Laterality Date  . ANKLE SURGERY Right 10-04-2015  _0    debridement achilles tendon & reattachment and heel excision osteophyte  . CATARACT EXTRACTION W/ INTRAOCULAR LENS  IMPLANT, BILATERAL  yrs ago  . CYSTOSCOPY WITH INSERTION OF UROLIFT  2019 approx.  Marland Kitchen SHOULDER SURGERY Right 1990's  . TONSILLECTOMY  age 46  . TRANSURETHRAL RESECTION OF PROSTATE N/A 09/23/2020   Procedure: TRANSURETHRAL RESECTION OF THE PROSTATE (TURP);  Surgeon: [name], MD;  Location: [facility];  Service: Urology;  Laterality: N/A;  75 MINS    Current Outpatient Medications:  .  amLODipine (NORVASC) 5 MG tablet, Take 5 mg by mouth daily., Disp: , Rfl:  .  aspirin EC 81 MG tablet, Take 81 mg by mouth at bedtime. Swallow whole., Disp: , Rfl:  .  Besifloxacin HCl (BESIVANCE) 0.6 % SUSP, Place 1 drop into the right eye See admin instructions. Instill 1 drop into the right eye three times a a day for for 3 days after the monthly shot, Disp: , Rfl:  .  cetirizine (ZYRTEC) 10 MG tablet, Take 10 mg by mouth daily., Disp: , Rfl:  .  cholecalciferol (VITAMIN D3) 25 MCG (1000 UNIT) tablet, Take 1,000 Units by mouth daily., Disp: , Rfl:  .  Coenzyme Q10 (CO Q-10) 100 MG CAPS, Take 100 mg by mouth daily., Disp: , Rfl:  .  docusate sodium (COLACE) 100 MG capsule, Take 1 capsule (100 mg total) by mouth every 12 (twelve) hours., Disp: 60 capsule, Rfl: 0 .  dorzolamide-timolol (COSOPT) 22.3-6.8 MG/ML ophthalmic solution, Place 1 drop into both eyes 2 (two) times daily., Disp: , Rfl:  .  famotidine (PEPCID) 40 MG tablet, Take one tablet by mouth once daily as  directed., Disp: 30 tablet, Rfl: 1 .  latanoprost (XALATAN) 0.005 % ophthalmic solution, Place 1 drop into both eyes at bedtime., Disp: , Rfl:  .  Multiple Vitamin (MULTIVITAMIN WITH MINERALS) TABS tablet, Take 1 tablet by mouth daily., Disp: , Rfl:  .  Multiple Vitamins-Minerals (PRESERVISION/LUTEIN) CAPS, Take 1 capsule by mouth daily., Disp: , Rfl:  .  prednisoLONE acetate (PRED FORTE) 1 % ophthalmic suspension, Place 1 drop into both eyes 4 (four) times daily., Disp: , Rfl:  .  triamcinolone (NASACORT) 55 MCG/ACT AERO nasal inhaler, Place 2 sprays into the nose daily as needed (for allergies)., Disp: , Rfl:  .  valsartan (DIOVAN) 80 MG tablet, Take 80 mg by mouth daily., Disp: , Rfl:  .  vitamin B-12 (CYANOCOBALAMIN) 1000 MCG tablet, Take 1,000 mcg by mouth daily., Disp: , Rfl:   Clinical Interview:   The following information was obtained during a clinical interview with Mr. Adkison and his daughter prior to cognitive testing.  Cognitive Symptoms: Decreased short-term memory: Endorsed. He stated that his memory has "gone to pot." He acknowledged ongoing difficulties with recalling  the details of previous conversations, recalling names of familiar roads or names, and occasionally misplacing or losing things in his residence. His daughter noted that he seems to have increased trouble keeping track of more finite details, as well as trouble remembering if he has taken his medications. Deficits were said to have gradually worsened over the past several years. His daughter added that they have seemed worse during the past two years.  Decreased long-term memory: Denied. Decreased attention/concentration: Denied. Reduced processing speed: Endorsed. Difficulties with executive functions: Endorsed. He reported noticing increased trouble diagnosing and working through various problems. He and his daughter denied trouble with indecision or impulsivity. Overt personality changes were denied.  Difficulties  with emotion regulation: Denied. Difficulties with receptive language: Denied. Difficulties with word finding: Endorsed. Decreased visuoperceptual ability: Endorsed. However, reported deficits with depth perception were said to be somewhat longstanding in nature and related to macular degeneration.   Difficulties completing ADLs: Somewhat. His daughters manage and organize his medications via pillbox. Despite this, he still has trouble remembering to take medications as prescribed, His daughter added that in a given week, he will likely forget about his medication 3/7 days. He and his wife continue to co-manage finances and are responsible for bill paying. Their daughters have been looking over their shoulder but have not had to take this over at the present time. Mr. Hilliker has been driving without any reported difficulty.   Additional Medical History: History of traumatic brain injury/concussion: Denied. History of stroke: Denied. History of seizure activity: Unclear. In the events representing his second hospitalization described above, his wife did report that Mr. Wellman started shaking and was staring off in a daze. When EMS arrived, he apparently became rigid and his eyes rolled back. Confusion was noted after this event and there were concerns surrounding convulsive syncope. EEG demonstrated slightly slow background readings at 7 Hz. Outside of this event, no other seizure-like events were reported.  History of known exposure to toxins: Denied. Symptoms of chronic pain: Largely denied. However, he did acknowledge some lower back pain which is manageable.  Experience of frequent headaches/migraines: Denied. Frequent instances of dizziness/vertigo: Denied.  Sensory changes: As stated above, he reported a history of macular degeneration. He reported struggling to read very fine print but otherwise denied significant visual acuity concerns. Other sensory changes/difficulties (e.g., hearing, taste,  or smell) were denied.  Balance/coordination difficulties: Largely denied. He did acknowledge ongoing bradykinesia and that this has worsened over the past year or so. Outside of this, he denied any frank balance instability or recent falls. One side of the body was not said to be any worse than the other. Outpatient PT was said to be helpful at reducing rigidity and improving his balance and mobility.  Other motor difficulties: Denied.  Sleep History: Estimated hours obtained each night: 7-8 hours.  Difficulties falling asleep: Denied. Difficulties staying asleep: Denied. Feels rested and refreshed upon awakening: Endorsed "for the most part."   History of snoring: Endorsed. History of waking up gasping for air: Denied. Witnessed breath cessation while asleep: Denied.  History of vivid dreaming: Denied. Excessive movement while asleep: Denied. Instances of acting out his dreams: Denied. His daughter reported a remote history of "fighting" in his sleep. However, this was thought to be medication-related and these behaviors stopped when medications were adjusted. They have not been present for many years outside of him infrequently speaking or calling out while asleep.   Psychiatric/Behavioral Health History: Depression: Currently, he stated that his spirits "  other than this new concern" (i.e., Parkinson's disease) have been "okay." He denied to his knowledge any prior mental health concerns or diagnoses. Current or remote suicidal ideation, intent, or plan was denied.  Anxiety: Denied. Mania: Denied. Trauma History: Denied. Visual/auditory hallucinations: Denied. Delusional thoughts: Denied.  Tobacco: Denied. Alcohol: He reported consuming about one shot of alcohol on a nightly basis. He denied a history of problematic alcohol abuse or dependence.  Recreational drugs: Denied. Caffeine: Denied.   Family History: Problem Relation Age of Onset  . Cancer Mother   . Heart attack Father    . Melanoma Father   . Alzheimer's disease Sister   . Heart disease Sister   . Other Sister        Polio  . Thyroid disease Daughter   . Hemachromatosis Daughter   . High blood pressure Daughter   . Diabetes Daughter    This information was confirmed by Mr. Boeckman.  Academic/Vocational History: Highest level of educational attainment: 14 years. He completed high school as well as an additional two years of college. He did not earn a formal Associate's degree. He described himself a poor student in academic settings, often earning C/D marks. However, both he and his daughter noted that grades improved in subjects which he enjoyed and were more relevant to his work. Towards the end of his time spent in college, grades approached [employer] status.  History of developmental delay: Denied. History of grade repetition: Denied. Enrollment in special education courses: Denied. History of LD/ADHD: Denied.  Employment: Retired. 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   Evaluation Results:   Behavioral Observations: Mr. Eyerman was accompanied by his daughter, arrived to his appointment on time, and was appropriately dressed and groomed. He appeared alert and oriented. He ambulated slowly but did not demonstrate any notable balance instability. He also did not utilize a cane or other assistive device. Gross motor functioning appeared intact upon informal observation and no abnormal movements (e.g., tremors) were noted. His affect was generally relaxed and positive. Masked facies could not be assessed due to him wearing a facemask throughout. He was soft-spoken and speech was sometimes slowed. However, overall, spontaneous speech was fluent and word finding difficulties were not observed during interview. Thought processes were coherent, organized, and normal in content. Insight into his cognitive difficulties appeared adequate.   During testing, speech  was slowed. Sustained attention was generally adequate. However, there were several instances where he commented he was "losing his focus" during testing (Digit Span, List Learning). There were also instances where he lost his place during various tasks (especially Dot Counting) which impacted his overall performance. Task engagement was adequate and he persisted when challenged. However, the speed at which he completed tasks did appear to affect performance. A subtle tremor was observed while performing motorized tasks. Overall, Mr. Retzloff was cooperative with the clinical interview and subsequent testing procedures.   Adequacy of Effort: The validity of neuropsychological testing is limited by the extent to which the individual being tested may be assumed to have exerted adequate effort during testing. Mr. Alen expressed his intention to perform to the best of his abilities and exhibited adequate task engagement and persistence. Scores across stand-alone and embedded performance validity measures were variable but often within expectation. One below expectation performance (Dot Counting) may have been influenced by deficits in processing speed rather than poor engagement. Mr. Garabedian also commented that he frequently lost his placing while counting during  this task. Overall, the results of the current evaluation are believed to be a valid representation of Mr. Parham's current cognitive functioning.  Test Results: Mr. Kinney was mildly disoriented at the time of the current evaluation. He incorrectly stated the current date and was unaware of the name of the clinic he was being seen in.   Intellectual abilities based upon educational and vocational attainment were estimated to be in the below average to average range. Premorbid abilities were estimated to be within the average range based upon a single-word reading test.   Processing speed was exceptionally low to well below average. Basic attention was  average. More complex attention (e.g., working memory) was variable, ranging from the well below average to average normative ranges. Executive functioning was variable. Performances were notably impaired across cognitive flexibility and response inhibition. He scored in the below average range on a task assessing verbal reasoning, and in the exceptionally high range across a task assessing safety and judgment.  Assessed receptive language abilities were average. Likewise, Mr. Kopko answered all questions asked of him appropriately during conversation. Assessed expressive language (e.g., verbal fluency and confrontation naming) was variable. Verbal fluency was well below average while confrontation naming was average.     Assessed visuospatial/visuoconstructional abilities were variable. He performed well on a line orientation task and when copying a complex figure. He performed in the exceptionally low range on a spatial block manipulation task. On his drawing of a clock, he only placed numbers 12-6. When asked if he was done, he counted from 12 to 6 and stated that he was finished. Numbers were placed outside of the clock face but in relatively correct locations. He placed one clock hand pointing towards 12 (which would have been placed incorrectly) and was unable to draw a second hand.    Learning (i.e., encoding) of novel verbal information was well below average. Spontaneous delayed recall (i.e., retrieval) of previously learned information was variable, ranging from the exceptionally low to average normative ranges. Retention rates were 0% across a story learning task, 40% across a list learning task, and 39% across a shape learning task. Performance across recognition tasks was average to above average, suggesting evidence for information consolidation.   Results of emotional screening instruments suggested that recent symptoms of generalized anxiety were in the minimal range, while symptoms of  depression were within normal limits. A screening instrument assessing recent sleep quality suggested the presence of minimal sleep dysfunction.  Tables of Scores:   Note: This summary of test scores accompanies the interpretive report and should not be considered in isolation without reference to the appropriate sections in the text. Descriptors are based on appropriate normative data and may be adjusted based on clinical judgment. The terms "impaired" and "within normal limits (WNL)" are used when a more specific level of functioning cannot be determined.       Validity Testing:   DESCRIPTOR       Dot Counting Test: --- --- Below Expectation  RBANS Effort Index: --- --- Within Expectation  WAIS-IV Reliable Digit Span: --- --- Within Expectation       Orientation:      Raw Score Percentile   NAB Orientation, Form 1 26/29 --- ---       Cognitive Screening:           Raw Score Percentile   SLUMS: 15/30 --- ---       RBANS, Form A: Standard Score/ Scaled Score Percentile   Total Score 83 13  Below Average  Immediate Memory 65 1 Exceptionally Low    List Learning 4 2 Well Below Average    Story Memory 4 2 Well Below Average  Visuospatial/Constructional 112 79 Above Average    Figure Copy 11 63 Average    Line Orientation 18/20 >75 Above Average  Language 89 23 Below Average    Picture Naming 10/10 >75 Above Average    Semantic Fluency 5 5 Well Below Average  Attention 82 12 Below Average    Digit Span 12 75 Above Average    Coding 2 <1 Exceptionally Low  Delayed Memory 88 21 Below Average    List Recall 2/10 17-25 Below Average to Average    List Recognition 19/20 26-50 Average    Story Recall 1 <1 Exceptionally Low    Story Recognition 9/12 29-52 Average    Figure Recall 7 16 Below Average    Figure Recognition 8/8 84+ Above Average       Intellectual Functioning:           Standard Score Percentile   Test of Premorbid Functioning: 94 34 Average       Attention/Executive  Function:          Trail Making Test (TMT): Raw Score (Scaled Score) Percentile     Part A 91 secs.,  1 error (5) 5 Well Below Average    Part B DC'D @ 300 secs., 0 errors --- Impaired  *Based on [employer] Older Normative Studies (MOANS)           Scaled Score Percentile   WAIS-IV Digit Span: 7 16 Below Average    Forward 9 37 Average    Backward 8 25 Average    Sequencing 5 5 Well Below Average        Scaled Score Percentile   WAIS-IV Similarities: 6 9 Below Average       D-KEFS Color-Word Interference Test: Raw Score (Scaled Score) Percentile     Color Naming 63 secs. (1) <1 Exceptionally Low    Word Reading 37 secs. (5) 5 Well Below Average    Inhibition 147 secs. (3) 1 Exceptionally Low      Total Errors 8 errors (6) 9 Below Average    Inhibition/Switching DC'D @ 180 secs. --- Impaired      Total Errors 5 errors --- ---       NAB Executive Functions Module, Form 1: T Score Percentile     Judgment 70 98 Exceptionally High       Language:          Verbal Fluency Test: Raw Score (Scaled Score) Percentile     Phonemic Fluency (CFL) 14 (5) 5 Well Below Average    Category Fluency 21 (5) 5 Well Below Average  *Based on [employer] Older Normative Studies (MOANS)          NAB Language Module, Form 1: T Score Percentile     Auditory Comprehension 56 73 Average    Naming 29/31 (52) 58 Average       Visuospatial/Visuoconstruction:      Raw Score Percentile   Clock Drawing: 4/10 --- Impaired        Scaled Score Percentile   WAIS-IV Block Design: 3 1 Exceptionally Low       Mood and Personality:      Raw Score Percentile   Geriatric Depression Scale: 6 --- Within Normal Limits  Geriatric Anxiety Scale: 7 --- Minimal    Somatic 4 --- Minimal  Cognitive 1 --- Minimal    Affective 2 --- Minimal       Additional Questionnaires:      Raw Score Percentile   PROMIS Sleep Disturbance Questionnaire: 16 --- None to Slight   Informed Consent and Coding/Compliance:   The current  evaluation represents a clinical evaluation for the purposes previously outlined by the referral source and is in no way reflective of a forensic evaluation.   Mr. Stawicki was provided with a verbal description of the nature and purpose of the present neuropsychological evaluation. Also reviewed were the foreseeable risks and/or discomforts and benefits of the procedure, limits of confidentiality, and mandatory reporting requirements of this provider. The patient was given the opportunity to ask questions and receive answers about the evaluation. Oral consent to participate was provided by the patient.   This evaluation was conducted by Christia Reading, Ph.D., licensed clinical neuropsychologist. Mr. Daughtridge completed a clinical interview with Dr. Melvyn Novas, billed as one unit [phone], and 150 minutes of cognitive testing and scoring, billed as one unit [id number] and four additional units [id number]. Psychometrist Cruzita Lederer, B.S., assisted Dr. Melvyn Novas with test administration and scoring procedures. As a separate and discrete service, Dr. Melvyn Novas spent a total of 165 minutes in interpretation and report writing billed as one unit [phone] and two units [id number].

## 2020-12-15 NOTE — Therapy (Signed)
[facility] [address] [place], [place], [address] Phone: [phone]   Fax:  [phone]  Physical Therapy Treatment/Re-Cert  Patient Details  Name: Seth Carter MRN: [id number] Date of Birth: 03-26-1941 Referring Provider (PT): Alonza Bogus, DO   Encounter Date: 12/15/2020   PT End of Session - 12/16/20 0917    Visit Number 14    Number of Visits 17    Date for PT Re-Evaluation 13/24/40   per cert on 07/25/70   Authorization Type Medicare and UHC-Follow Medicare guidelines    Progress Note Due on Visit 10    PT Start Time 1533    PT Stop Time 1615    PT Time Calculation (min) 42 min    Activity Tolerance Patient tolerated treatment well    Behavior During Therapy [facility] for tasks assessed/performed           Past Medical History:  Diagnosis Date  . Acute pharyngitis   . AKI (acute kidney injury) 09/29/2020  . Allergic rhinitis   . Benign prostatic hyperplasia with lower urinary tract symptoms 09/23/2020  . Bradykinesia   . Cervical spondylosis without myelopathy 12/18/2019  . Chronic back pain   . Chronic sinusitis   . Degeneration of lumbar intervertebral disc 08/20/2020  . ED (erectile dysfunction)   . Enlarged prostate 12/15/2020  . Essential hypertension    nuclear test in epic 01-16-2020 normal perfusion no ischemia, normal lvf and wall motion, nuclear ef 65%  . Family history of hemochromatosis   . Fatigue   . Gastroesophageal reflux disease without esophagitis 12/18/2019  . Generalized weakness   . Glaucoma, both eyes   . Hardening of the aorta (main artery of the heart)   . Hemorrhoids   . History of syncope    cardiology evaluation by dr Marlou Porch, note in epic 12-26-2019, work-up includes event monitor 02-04-2020( showed SR with occ PAC & PVC rare PAT)/ echo  12-26-2019 (mild LVH, ef 50-55%, moderate MR, mild to moderate AV sclerosis no stenosis, ascending aorta 26mm) and nuclear test 01-16-2020 (normal  perfusion w/ normal LV and wall motion, nuclear ef 65%)  . Hyperglycemia 12/18/2019  . Hypogonadism in male   . Leukocytosis 09/29/2020  . Macular degeneration, right eye   . 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  . Mixed hyperlipidemia   . Onychomycosis 12/18/2019  . Osteoarthritis of knee 12/18/2019  . Pain in hand 12/18/2019  . Parkinson's disease 08/2020  . Renal stone 12/18/2019  . Sebaceous cyst 12/18/2019  . Seizure-like activity 09/29/2020  . Spinal stenosis of lumbar region 08/20/2020  . 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/2019  . Toxic effect of venom of wasps 12/18/2019  . Vitamin B12 deficiency   . Vitamin D deficiency     Past Surgical History:  Procedure Laterality Date  . ANKLE SURGERY Right 10-04-2015  $RemoveBef'@[email or web address]$    debridement achilles tendon & reattachment and heel excision osteophyte  . CATARACT EXTRACTION W/ INTRAOCULAR LENS  IMPLANT, BILATERAL  yrs ago  . CYSTOSCOPY WITH INSERTION OF UROLIFT  2019 approx.  [facility] Kitchen SHOULDER SURGERY Right 1990's  . TONSILLECTOMY  age 51  . TRANSURETHRAL RESECTION OF PROSTATE N/A 09/23/2020   Procedure: TRANSURETHRAL RESECTION OF THE PROSTATE (TURP);  Surgeon: [name], MD;  Location: [facility];  Service: Urology;  Laterality: N/A;  75 MINS    There were no vitals filed for this visit.   Subjective Assessment - 12/15/20 1536    Subjective Bought his recumbent bike -  states it should be delivered tomorrow or Saturday. Had his neurocognitive testing this morning.    Pertinent History macular degeneration with difficulty with depth perception    Patient Stated Goals Pt's goal for therapy is to do everything he wants to do, keep strength up.    Currently in Pain? No/denies              [facility] PT Assessment - 12/16/20 0001      Assessment   Medical Diagnosis Parkinson's disease    Referring Provider (PT) [name], DO    Onset Date/Surgical Date 09/20/20      Prior Function   Level of  Independence Independent                         [facility] Adult PT Treatment/Exercise - 12/15/20 1557      Ambulation/Gait   Ambulation/Gait Yes    Ambulation/Gait Assistance 6: Modified independent (Device/Increase time)    Ambulation/Gait Assistance Details outdoors over paved surfaces, cues intermittently to relax BUE for more relaxed arm swing    Ambulation Distance (Feet) 1000 Feet    Assistive device None    Gait Pattern Step-through pattern;Decreased arm swing - right;Decreased arm swing - left;Decreased step length - right;Decreased step length - left;Decreased trunk rotation    Ambulation Surface Level;Indoor;Unlevel;Outdoor;Paved      High Level Balance   High Level Balance Comments SLS: 6 seconds on RLE 12 seconds on LLE               Pt performs PWR! Moves inseatedposition x10reps  PWR! Up for improved posture  PWR! Rock for improved weighshifting (needs reminder cues for technique) and bringing forearm down to his thigh.   PWR! Twist for improved trunk rotation-(needs occasional cues to stop in middle, doing better using foot to pivot for improved reach to clap)  PWR! Step for improved step initiation- single step out and in x10 reps and then with double leg step out and in 2 x 5 reps B - with verbal and demo cues for technique, improving with 2nd set   Cues provided forincreased amplitude and intensity of movements.  Wrote down where pt can look up on YouTube for reminder on how to perform.        PT Education - 12/15/20 1552    Education Details will be scheduled for return PD screens in 6-9 mo for all 3 disciplines after D/C next week, optimal fitness plan for PD (provided handout and educated on each) - see pt instructions, getting an on order from Dr. Carles Collet for OT at this clinic for pt to schedule once he returns from the [employer]) Educated Patient    Methods Explanation;Handout    Comprehension Verbalized understanding             PT Short Term Goals - 11/16/20 1051      PT SHORT TERM GOAL #1   Title Pt will perform HEP with family supervision for improved strength, balance, transfers, and gait.  TARGET 11/12/2020    Baseline pt reports intermittently performing his exercises at home - did not have time to formally review entirety of exercise program.    Time 4    Period Weeks    Status Partially Met      PT SHORT TERM GOAL #2   Title MiniBESTest to be completed, with goal to be written as appropriate.    Baseline Mini-Best 24/28 on 10/22/20. SLS  goal written in LTG    Time 4    Period Weeks    Status Achieved      PT SHORT TERM GOAL #3   Title 6MWT to be assessed, with goal to be written as appropriate.    Baseline 6MWT performed completing 1035' on 10/22/20. LTG written    Time 4    Period Weeks    Status Achieved      PT SHORT TERM GOAL #4   Title Pt will improve TUG score to less than or equal to 13.5 sec for decreased fall risk.    Baseline 9 seconds on 11/16/20    Time 4    Period Weeks    Status Achieved      PT SHORT TERM GOAL #5   Title Pt will verbalize understanding of fall prevention in home environment.    Baseline provided handout to patient and reviewed with pt's spouse at end of session.    Time 4    Period Weeks    Status Achieved             PT Long Term Goals - 12/15/20 1555      PT LONG TERM GOAL #1   Title Pt will perform HEP with family supervision for improved strength, balance, transfers, and gait.  TARGET 12/10/20    Time 8    Period Weeks    Status New      PT LONG TERM GOAL #2   Title Pt will improve TUG/TUG cognitive score to less than or equal to 10% difference, for improved dual tasking with gait.    Baseline 8.68 TUG, 14.15 cog TUG.    Time 8    Period Weeks    Status Not Met      PT LONG TERM GOAL #3   Title Pt will improve gait velocity to at least 2.8 ft/sec for improved efficiency and safety with community gait.    Baseline 9.06 seconds = 3.62  ft/sec    Time 8    Period Weeks    Status Achieved      PT LONG TERM GOAL #4   Title Pt will ambulate at least 1000 ft, indoor and outdoor surfaces, mod independnetly for improved community and outdoor gait safety.    Baseline met on 12/15/20    Time 8    Period Weeks    Status Achieved      PT LONG TERM GOAL #5   Title Pt will increase SLS time to >20 sec each leg for improved balance.    Baseline <20 sec when performed in mini-Best, 6 seconds on RLE 12 seconds on LLE    Time 8    Period Weeks    Status Not Met      PT LONG TERM GOAL #6   Title Pt will increase 6 min walk distance to >1235' for improved community mobility.    Baseline 10/22/20 1035', performed 1313' on 12/13/20    Time 8    Period Weeks    Status Achieved          Ongoing LTG:  PT Long Term Goals - 12/16/20 0925      PT LONG TERM GOAL #1   Title Pt will perform HEP with supervision for improved strength, balance, transfers, and gait and verbalize understanding of PD fitness plan s/p D/C.   TARGET 12/24/20    Time 1    Period Weeks    Status New  Plan - 12/16/20 0920    Clinical Impression Statement Assessed remainder of LTGs today due to it being week 8 in pt's POC. Pt met LTG #4 in regards to performing outdoor/indoor gait with mod I. Able to demo improved step length, needing occassional cues to relax B arms for more recirpocal arm swing. Pt did not meet LTG #5 in regards to SLS, pt with more difficulty with LLE than RLE. Began to review optimal fitness program for PD post discharge and pt's PWR moves for HEP. Will re-cert to cover last 2 remaining scheduled visits to finalize post D/C exercise program. LTGs updated as appropriate.    Personal Factors and Comorbidities Comorbidity 3+    Comorbidities PMH:  chronic back pain, DDD, fatigue, GERD, glaucoma, hx of syncope, OA,    Examination-Activity Limitations Locomotion Level;Transfers;Stairs    Examination-Participation Restrictions  Community Activity    Stability/Clinical Decision Making Evolving/Moderate complexity    Rehab Potential Good    PT Frequency 2x / week    PT Duration --   1 week   PT Treatment/Interventions ADLs/Self Care Home Management;Gait training;Stair training;Functional mobility training;Therapeutic activities;Therapeutic exercise;Balance training;Neuromuscular re-education;DME Instruction;Patient/family education    PT Next Visit Plan finalize HEP and community program    PT Home Exercise Plan seated PWR moves. Access Code: [email or web address]    Consulted and Agree with Plan of Care Patient           Patient will benefit from skilled therapeutic intervention in order to improve the following deficits and impairments:  Abnormal gait,Difficulty walking,Decreased balance,Decreased mobility,Decreased strength,Postural dysfunction  Visit Diagnosis: Other abnormalities of gait and mobility  Unsteadiness on feet  Other symptoms and signs involving the nervous system  Abnormal posture     Problem List Patient Active Problem List   Diagnosis Date Noted  . 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  . Allergic rhinitis   . Chronic sinusitis   . Enlarged prostate   . Hardening of the aorta (main artery of the heart)   . Vitamin B12 deficiency   . Seizure-like activity 09/29/2020  . Parkinson's disease 09/29/2020  . Leukocytosis 09/29/2020  . Benign prostatic hyperplasia with lower urinary tract symptoms 09/23/2020  . Degeneration of lumbar intervertebral disc 08/20/2020  . Spinal stenosis of lumbar region 08/20/2020  . 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/2019  . Hemorrhoids 12/18/2019  . Vitamin D deficiency 12/18/2019  . Male erectile dysfunction 12/18/2019  . Osteoarthritis of knee 12/18/2019  . Impaired fasting glucose 12/18/2019  . Gastroesophageal reflux disease without esophagitis 12/18/2019  . Essential hypertension 12/18/2019  . Mixed hyperlipidemia 12/18/2019  .  Fatigue 12/18/2019  . Pain in hand 12/18/2019  . Onychomycosis 12/18/2019  . Cervical spondylosis without myelopathy 12/18/2019  . Toxic effect of venom of wasps 12/18/2019  . Right flank pain 12/18/2019  . Family history of hemochromatosis 12/18/2019  . Pure hypercholesterolemia 12/18/2019  . Hyperglycemia 12/18/2019  . Shoulder joint pain 01/29/2019    Arliss Journey, PT, DPT  12/16/2020, 9:24 AM  [facility] [address] [place], [place], [address] Phone: [phone]   Fax:  [phone]  Name: Seth Carter MRN: [id number] Date of Birth: 1940-09-17

## 2020-12-15 NOTE — Patient Instructions (Signed)
Optimal Fitness Program after Therapy for People with Parkinson's Disease  1)  Therapy Home Exercise Program             -Do these Exercises DAILY as instructed by your therapist             -Big, deliberate effort with exercises             -These exercises are important to perform consistently, even when therapist has  finished, because these therapy exercises often address your specific         Parkinson's difficulties   2)  Walking             -  Work up to walking 3-5 times per week, 20-30 minutes per day             -This can be done at home, driveway, quiet street or an indoor track             -Focus should be on your Best posture, arm swing, step length for your best walking pattern  3)  Aerobic Exercise             -Work up to 3-5 times per week, 30 minutes per day             -This can be stationary bike, seated stepper machine, elliptical machine             -Work up to 7-8/10 intensity during the exercise, at minimal to moderate                 Resistance

## 2020-12-15 NOTE — Progress Notes (Signed)
   Psychometrician Note   Cognitive testing was administered to Seth Carter by Cruzita Lederer, B.S. (psychometrist) under the supervision of Dr. Christia Reading, Ph.D., licensed psychologist on 12/15/2020. Seth Carter did not appear overtly distressed by the testing session per behavioral observation or responses across self-report questionnaires. Rest breaks were offered.    The battery of tests administered was selected by Dr. Christia Reading, Ph.D. with consideration to Seth Carter's current level of functioning, the nature of his symptoms, emotional and behavioral responses during interview, level of literacy, observed level of motivation/effort, and the nature of the referral question. This battery was communicated to the psychometrist. Communication between Dr. Christia Reading, Ph.D. and the psychometrist was ongoing throughout the evaluation and Dr. Christia Reading, Ph.D. was immediately accessible at all times. Dr. Christia Reading, Ph.D. provided supervision to the psychometrist on the date of this service to the extent necessary to assure the quality of all services provided.    Seth Carter will return within approximately 1-2 weeks for an interactive feedback session with Dr. Melvyn Novas at which time his test performances, clinical impressions, and treatment recommendations will be reviewed in detail. Seth Carter understands he can contact our office should he require our assistance before this time.  A total of 150 minutes of billable time were spent face-to-face with Seth Carter by the psychometrist. This includes both test administration and scoring time. Billing for these services is reflected in the clinical report generated by Dr. Christia Reading, Ph.D.  This note reflects time spent with the psychometrician and does not include test scores or any clinical interpretations made by Dr. Melvyn Novas. The full report will follow in a separate note.

## 2020-12-17 ENCOUNTER — Telehealth: Payer: Self-pay | Admitting: Physical Therapy

## 2020-12-17 DIAGNOSIS — G20 Parkinson's disease: Secondary | ICD-10-CM

## 2020-12-17 NOTE — Telephone Encounter (Signed)
Dr. Carles Collet, Aquilla Hacker has been seen by physical therapy at [facility]. The patient would benefit from an OT evaluation for decr coordination and difficulties with fine motor tasks due to bradykinesia.   If you agree, please place an order in [facility] workque in [facility] or fax the order to [phone]. Thank you, Janann August, PT, DPT 12/17/20 10:44 AM    [facility] [address] [place],   [address] Phone:  [phone] Fax:  [phone]

## 2020-12-21 ENCOUNTER — Other Ambulatory Visit: Payer: Self-pay

## 2020-12-21 ENCOUNTER — Ambulatory Visit: Payer: Medicare Other | Admitting: Physical Therapy

## 2020-12-21 DIAGNOSIS — E7800 Pure hypercholesterolemia, unspecified: Secondary | ICD-10-CM | POA: Diagnosis not present

## 2020-12-21 DIAGNOSIS — R2681 Unsteadiness on feet: Secondary | ICD-10-CM

## 2020-12-21 DIAGNOSIS — R2689 Other abnormalities of gait and mobility: Secondary | ICD-10-CM

## 2020-12-21 DIAGNOSIS — N400 Enlarged prostate without lower urinary tract symptoms: Secondary | ICD-10-CM | POA: Diagnosis not present

## 2020-12-21 DIAGNOSIS — R293 Abnormal posture: Secondary | ICD-10-CM | POA: Diagnosis not present

## 2020-12-21 DIAGNOSIS — E782 Mixed hyperlipidemia: Secondary | ICD-10-CM | POA: Diagnosis not present

## 2020-12-21 DIAGNOSIS — R29818 Other symptoms and signs involving the nervous system: Secondary | ICD-10-CM

## 2020-12-21 DIAGNOSIS — N401 Enlarged prostate with lower urinary tract symptoms: Secondary | ICD-10-CM | POA: Diagnosis not present

## 2020-12-21 DIAGNOSIS — M1990 Unspecified osteoarthritis, unspecified site: Secondary | ICD-10-CM | POA: Diagnosis not present

## 2020-12-21 DIAGNOSIS — I10 Essential (primary) hypertension: Secondary | ICD-10-CM | POA: Diagnosis not present

## 2020-12-21 DIAGNOSIS — K219 Gastro-esophageal reflux disease without esophagitis: Secondary | ICD-10-CM | POA: Diagnosis not present

## 2020-12-21 DIAGNOSIS — G20 Parkinson's disease: Secondary | ICD-10-CM | POA: Diagnosis not present

## 2020-12-21 DIAGNOSIS — M179 Osteoarthritis of knee, unspecified: Secondary | ICD-10-CM | POA: Diagnosis not present

## 2020-12-21 DIAGNOSIS — M4722 Other spondylosis with radiculopathy, cervical region: Secondary | ICD-10-CM | POA: Diagnosis not present

## 2020-12-21 NOTE — Therapy (Signed)
[facility] [address] [place], [place], [address] Phone: [phone]   Fax:  [phone]  Physical Therapy Treatment  Patient Details  Name: Seth Carter MRN: [id number] Date of Birth: Dec 12, 1940 Referring Provider (PT): Alonza Bogus, DO   Encounter Date: 12/21/2020   PT End of Session - 12/21/20 1627    Visit Number 15    Number of Visits 17    Date for PT Re-Evaluation 89/38/10   per cert on 1/75/10   Authorization Type Medicare and UHC-Follow Medicare guidelines    Progress Note Due on Visit 10    PT Start Time 2585    PT Stop Time 1613    PT Time Calculation (min) 42 min    Activity Tolerance Patient tolerated treatment well    Behavior During Therapy [facility] for tasks assessed/performed           Past Medical History:  Diagnosis Date  . Acute pharyngitis   . AKI (acute kidney injury) 09/29/2020  . Allergic rhinitis   . Benign prostatic hyperplasia with lower urinary tract symptoms 09/23/2020  . Bradykinesia   . Cervical spondylosis without myelopathy 12/18/2019  . Chronic back pain   . Chronic sinusitis   . Degeneration of lumbar intervertebral disc 08/20/2020  . ED (erectile dysfunction)   . Enlarged prostate 12/15/2020  . Essential hypertension    nuclear test in epic 01-16-2020 normal perfusion no ischemia, normal lvf and wall motion, nuclear ef 65%  . Family history of hemochromatosis   . Fatigue   . Gastroesophageal reflux disease without esophagitis 12/18/2019  . Generalized weakness   . Glaucoma, both eyes   . Hardening of the aorta (main artery of the heart)   . Hemorrhoids   . History of syncope    cardiology evaluation by dr Marlou Porch, note in epic 12-26-2019, work-up includes event monitor 02-04-2020( showed SR with occ PAC & PVC rare PAT)/ echo  12-26-2019 (mild LVH, ef 50-55%, moderate MR, mild to moderate AV sclerosis no stenosis, ascending aorta 19mm) and nuclear test 01-16-2020 (normal perfusion  w/ normal LV and wall motion, nuclear ef 65%)  . Hyperglycemia 12/18/2019  . Hypogonadism in male   . Leukocytosis 09/29/2020  . Macular degeneration, right eye   . 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  . Mixed hyperlipidemia   . Onychomycosis 12/18/2019  . Osteoarthritis of knee 12/18/2019  . Pain in hand 12/18/2019  . Parkinson's disease 08/2020  . Renal stone 12/18/2019  . Sebaceous cyst 12/18/2019  . Seizure-like activity 09/29/2020  . Spinal stenosis of lumbar region 08/20/2020  . 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/2019  . Toxic effect of venom of wasps 12/18/2019  . Vitamin B12 deficiency   . Vitamin D deficiency     Past Surgical History:  Procedure Laterality Date  . ANKLE SURGERY Right 10-04-2015  @[facility]    debridement achilles tendon & reattachment and heel excision osteophyte  . CATARACT EXTRACTION W/ INTRAOCULAR LENS  IMPLANT, BILATERAL  yrs ago  . CYSTOSCOPY WITH INSERTION OF UROLIFT  2019 approx.  [facility] SHOULDER SURGERY Right 1990's  . TONSILLECTOMY  age 37  . TRANSURETHRAL RESECTION OF PROSTATE N/A 09/23/2020   Procedure: TRANSURETHRAL RESECTION OF THE PROSTATE (TURP);  Surgeon: [name], MD;  Location: [facility];  Service: Urology;  Laterality: N/A;  75 MINS    There were no vitals filed for this visit.   Subjective Assessment - 12/21/20 1533    Subjective Has started looking up  at a gym up the road. Got his stationary bike, used it 2 times. Used it for about 10 minutes.    Pertinent History macular degeneration with difficulty with depth perception    Patient Stated Goals Pt's goal for therapy is to do everything he wants to do, keep strength up.    Currently in Pain? No/denies                             [facility] Adult PT Treatment/Exercise - 12/21/20 1553      Therapeutic Activites    Therapeutic Activities Other Therapeutic Activities    Other Therapeutic Activities discussed that OT referral came back for  Dr. Carles Collet - pt will schedule OT after he returns from the beach this summer as he has a lot of trips coming up. reviewed from last session about getting scheduled for return screens upon D/C, after discussion today, pt would rather return for a bout of therapy and will do a return PD eval in 6-9 months for pt to get scheduled at next session. after use of [employer] pt bumped his elbow cause it to bleed, therapist cleaning with rubbing alcohol and providing bandaid      Knee/Hip Exercises: Aerobic   Stepper with BUE/BLE for 8 minutes at gear 3 cues for rpm between 85-95, reviewed intensity levels in regards to aerobic exercise and how pt can use this level of work when using his recumbent bike at home. pt reportings working consistently at a 7/10 intensity level. discussed pt to check if his bike has an rpm and to make therapist aware at next session to help determine what rpm to work at           [place] with arms extended: PWR! Up PWR! [employer]! Twist PWR! Step  2 sets of 10 reps each, provided handout and education of purpose of performing and how it relates to functional activity. For pt to perform daily and when he has difficulties with more fine motor/coordination tasks      Pt performs PWR! Moves instandingposition x20reps  PWR! Up for improved posture  PWR! Rock for improved weighshifting - initial cues to look up at hands.   PWR! Twist for improved trunk rotation-intermittent cues to stop in midline before twisting to opposite side.   PWR! Step for improved step initiation-cues for foot clearance  Cues provided forincreased amplitude and  Intensity. Cues to widen BOS.    Pt reports using YouTube at home to help while performing.   PT Education - 12/21/20 1626    Education Details see TA, reviewed optimal fitness plan for PD s/p PT dishcharge.    Person(s) Educated Patient    Methods Explanation    Comprehension Verbalized understanding            PT  Short Term Goals - 12/16/20 0925      PT SHORT TERM GOAL #1   Title ALL STGS = LTGS             PT Long Term Goals - 12/16/20 0925      PT LONG TERM GOAL #1   Title Pt will perform HEP with supervision for improved strength, balance, transfers, and gait and verbalize understanding of PD fitness plan s/p D/C.   TARGET 12/24/20    Time 1    Period Weeks    Status New  Plan - 12/21/20 1633    Clinical Impression Statement Demonstrated and provided PWR hands handout for pt to perform at home daily and before fine motor tasks/coordination as pt won't be scheduled for an OT eval until he returns from his beach trips. Reviewed standing PWR moves and optimal fitness program for PD after D/C. Pt has recently gotten his recumbent bike,educated on trying to perform at 7/10 intensity. Will review HEP and community program at next session and D/C pt. Pt to be scheduled for a return eval in 6-9 months.    Personal Factors and Comorbidities Comorbidity 3+    Comorbidities PMH:  chronic back pain, DDD, fatigue, GERD, glaucoma, hx of syncope, OA,    Examination-Activity Limitations Locomotion Level;Transfers;Stairs    Examination-Participation Restrictions Community Activity    Stability/Clinical Decision Making Evolving/Moderate complexity    Rehab Potential Good    PT Frequency 2x / week    PT Duration --   1 week   PT Treatment/Interventions ADLs/Self Care Home Management;Gait training;Stair training;Functional mobility training;Therapeutic activities;Therapeutic exercise;Balance training;Neuromuscular re-education;DME Instruction;Patient/family education    PT Next Visit Plan review standing PWR moves as needed, educate on level of intensity for seated recumbent bike at home, review anything regarding his community program and get scheduled for return eval in 6-9 months    PT Home Exercise Plan seated PWR moves. Access Code: [email or web address]    Consulted and Agree with Plan of Care  Patient           Patient will benefit from skilled therapeutic intervention in order to improve the following deficits and impairments:  Abnormal gait,Difficulty walking,Decreased balance,Decreased mobility,Decreased strength,Postural dysfunction  Visit Diagnosis: Other abnormalities of gait and mobility  Unsteadiness on feet  Other symptoms and signs involving the nervous system     Problem List Patient Active Problem List   Diagnosis Date Noted  . 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  . Allergic rhinitis   . Chronic sinusitis   . Enlarged prostate   . Hardening of the aorta (main artery of the heart)   . Vitamin B12 deficiency   . Seizure-like activity 09/29/2020  . Parkinson's disease 09/29/2020  . Leukocytosis 09/29/2020  . Benign prostatic hyperplasia with lower urinary tract symptoms 09/23/2020  . Degeneration of lumbar intervertebral disc 08/20/2020  . Spinal stenosis of lumbar region 08/20/2020  . 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/2019  . Hemorrhoids 12/18/2019  . Vitamin D deficiency 12/18/2019  . Male erectile dysfunction 12/18/2019  . Osteoarthritis of knee 12/18/2019  . Impaired fasting glucose 12/18/2019  . Gastroesophageal reflux disease without esophagitis 12/18/2019  . Essential hypertension 12/18/2019  . Mixed hyperlipidemia 12/18/2019  . Fatigue 12/18/2019  . Pain in hand 12/18/2019  . Onychomycosis 12/18/2019  . Cervical spondylosis without myelopathy 12/18/2019  . Toxic effect of venom of wasps 12/18/2019  . Right flank pain 12/18/2019  . Family history of hemochromatosis 12/18/2019  . Pure hypercholesterolemia 12/18/2019  . Hyperglycemia 12/18/2019  . Shoulder joint pain 01/29/2019    Arliss Journey, PT, DPT  12/21/2020, 4:36 PM  [facility] [address] [place], [place], [address] Phone: [phone]   Fax:  [phone]  Name: MICKELL BIRDWELL MRN:  [id number] Date of Birth: [birth year]

## 2020-12-21 NOTE — Patient Instructions (Signed)
PWR! Hands  With arms stretched out in front of you (elbows straight), perform the following:  PWR! Up: Close hands and flick fingers open and apart BIG  PWR! Rock: Move wrists up and down [employer]! Twist: Twist palms up and down BIG  PWR! Step: Touch index finger to thumb while keeping other fingers straight. Flick fingers out BIG (thumb out/straighten fingers). Repeat with other fingers. (Step your thumb to each finger).  PWR! Hands: Push hands out BIG. Elbows straight, wrists up, fingers open and spread apart BIG. (Can also perform by pushing down on table, chair, knees. Push above head, out to the side, behind you, in front of you.)  Then, start with elbows bent and hands closed.    Make each movement big and deliberate so that you feel the movement.  Perform at least 10 repetitions 1x/day, but perform PWR! hands throughout the day when you are having trouble using your hands (picking up/manipulating small objects, writing, eating, typing, sewing, buttoning, etc.).

## 2020-12-22 ENCOUNTER — Telehealth: Payer: Self-pay | Admitting: Neurology

## 2020-12-22 ENCOUNTER — Ambulatory Visit (INDEPENDENT_AMBULATORY_CARE_PROVIDER_SITE_OTHER): Payer: Medicare Other | Admitting: Psychology

## 2020-12-22 DIAGNOSIS — D692 Other nonthrombocytopenic purpura: Secondary | ICD-10-CM | POA: Diagnosis not present

## 2020-12-22 DIAGNOSIS — G20 Parkinson's disease: Secondary | ICD-10-CM | POA: Diagnosis not present

## 2020-12-22 DIAGNOSIS — D220 Melanocytic nevi of lip: Secondary | ICD-10-CM | POA: Diagnosis not present

## 2020-12-22 DIAGNOSIS — L738 Other specified follicular disorders: Secondary | ICD-10-CM | POA: Diagnosis not present

## 2020-12-22 DIAGNOSIS — D225 Melanocytic nevi of trunk: Secondary | ICD-10-CM | POA: Diagnosis not present

## 2020-12-22 DIAGNOSIS — L820 Inflamed seborrheic keratosis: Secondary | ICD-10-CM | POA: Diagnosis not present

## 2020-12-22 DIAGNOSIS — D1801 Hemangioma of skin and subcutaneous tissue: Secondary | ICD-10-CM | POA: Diagnosis not present

## 2020-12-22 DIAGNOSIS — G3184 Mild cognitive impairment, so stated: Secondary | ICD-10-CM | POA: Diagnosis not present

## 2020-12-22 DIAGNOSIS — L821 Other seborrheic keratosis: Secondary | ICD-10-CM | POA: Diagnosis not present

## 2020-12-22 NOTE — Telephone Encounter (Signed)
Patient's wife was here after getting the neuropsych results and wants to see about getting the process started to get the DAT Scan done.

## 2020-12-22 NOTE — Progress Notes (Signed)
   Neuropsychology Feedback Session Tillie Rung. [place] Department of Neurology  Reason for Referral:   Seth Vandervelden [name] by Alonza Bogus, D.O.,to characterize hiscurrent cognitive functioning and assist with diagnostic clarity and treatment planning in the context of a new diagnosis of Parkinson's disease and concern surrounding cognitive decline.   Feedback:   Seth Carter completed a comprehensive neuropsychological evaluation on 12/15/2020. Please refer to that encounter for the full report and recommendations. Briefly, results suggested prominent impairments across processing speed, performance-based executive functions, verbal fluency, and both encoding (i.e., learning) and retrieval aspects of memory. Performance variability was exhibited across more complex attention and visuospatial abilities. Performances were generally appropriate across basic attention, safety/judgment, receptive language, confrontation naming, and consolidation/recognition aspects of memory. Regarding etiology, Seth Carter's pattern of generally mild to moderate frontal subcortical dysfunction with added visuospatial involvement is consistent with what would be expected in Parkinson's disease. Given his advanced age of symptom onset and that dominant symptoms do not involve tremors, he does seem to be at an increased risk for the future transition to a Parkinson's disease dementia presentation.  Seth Carter was accompanied by his wife and daughter during the current feedback session. Content of the current session focused on the results of his neuropsychological evaluation. Seth Carter and his family were given the opportunity to ask questions and their questions were answered. They were encouraged to reach out should additional questions arise. A copy of his report was provided at the conclusion of the visit.      30 minutes were spent conducting the current  feedback session with Seth Carter, billed as one unit [phone].

## 2020-12-22 NOTE — Telephone Encounter (Signed)
Per Dr. Carles Collet last office visit with patient:   "-they ask about DaT scan and daughters would like to schedule.  Caio't want to do that until we find out if patient is allergic to filler with carbidopa/levodopa or iodine, because the DaT scan has cross reactivity with iodine allergy."

## 2020-12-23 ENCOUNTER — Encounter: Payer: Self-pay | Admitting: Physical Therapy

## 2020-12-23 ENCOUNTER — Other Ambulatory Visit: Payer: Self-pay

## 2020-12-23 ENCOUNTER — Ambulatory Visit: Payer: Medicare Other | Attending: Neurology | Admitting: Physical Therapy

## 2020-12-23 DIAGNOSIS — R29818 Other symptoms and signs involving the nervous system: Secondary | ICD-10-CM | POA: Diagnosis not present

## 2020-12-23 DIAGNOSIS — R2681 Unsteadiness on feet: Secondary | ICD-10-CM | POA: Diagnosis not present

## 2020-12-23 DIAGNOSIS — R2689 Other abnormalities of gait and mobility: Secondary | ICD-10-CM | POA: Diagnosis not present

## 2020-12-23 NOTE — Telephone Encounter (Signed)
Called patients wife and left a message for a call back.  

## 2020-12-23 NOTE — Telephone Encounter (Signed)
Sonnie't believe wife in room last visit (on phone I think) but they requested holding it until they saw allergist until they found out what he was allergic to.  Did he go to that appt?  I think he also requested 2nd opinion from neuro at [facility] and that is next month

## 2020-12-24 NOTE — Therapy (Signed)
[facility] [address] [place], [place], [address] Phone: [phone]   Fax:  [phone]  Physical Therapy Treatment/Discharge Summary  Patient Details  Name: Seth Carter MRN: [id number] Date of Birth: 1940-09-21 Referring Provider (PT): Alonza Bogus, DO   Encounter Date: 12/23/2020   PHYSICAL THERAPY DISCHARGE SUMMARY  Visits from Start of Care: 16  Current functional level related to goals / functional outcomes:  PT Long Term Goals - 12/24/20 1359      PT LONG TERM GOAL #1   Title Pt will perform HEP with supervision for improved strength, balance, transfers, and gait and verbalize understanding of PD fitness plan s/p D/C.   TARGET 12/24/20    Time 1    Period Weeks    Status Achieved          Previous LTGs assessed last week, with pt meeting most LTGs.   Remaining deficits: Balance, posture, bradykinesia   Education / Equipment: Educated in [employer], community fitness options, Parkinson's resources.  Plan: Patient agrees to discharge.  Patient goals were met. Patient is being discharged due to meeting the stated rehab goals.  ?????Recommend PT return eval in 6-9 months.         Mady Haagensen, PT 12/24/20 2:09 PM Phone: [phone] Fax: [phone]    PT End of Session - 12/23/20 1402    Visit Number 16    Number of Visits 17    Date for PT Re-Evaluation 35/32/99   per cert on 2/42/68   Authorization Type Medicare and UHC-Follow Medicare guidelines    Progress Note Due on Visit 10    PT Start Time 1404    PT Stop Time 1437    PT Time Calculation (min) 33 min    Activity Tolerance Patient tolerated treatment well    Behavior During Therapy [facility] for tasks assessed/performed           Past Medical History:  Diagnosis Date  . Acute pharyngitis   . AKI (acute kidney injury) 09/29/2020  . Allergic rhinitis   . Benign prostatic hyperplasia with lower urinary tract symptoms 09/23/2020  .  Bradykinesia   . Cervical spondylosis without myelopathy 12/18/2019  . Chronic back pain   . Chronic sinusitis   . Degeneration of lumbar intervertebral disc 08/20/2020  . ED (erectile dysfunction)   . Enlarged prostate 12/15/2020  . Essential hypertension    nuclear test in epic 01-16-2020 normal perfusion no ischemia, normal lvf and wall motion, nuclear ef 65%  . Family history of hemochromatosis   . Fatigue   . Gastroesophageal reflux disease without esophagitis 12/18/2019  . Generalized weakness   . Glaucoma, both eyes   . Hardening of the aorta (main artery of the heart)   . Hemorrhoids   . History of syncope    cardiology evaluation by dr Marlou Porch, note in epic 12-26-2019, work-up includes event monitor 02-04-2020( showed SR with occ PAC & PVC rare PAT)/ echo  12-26-2019 (mild LVH, ef 50-55%, moderate MR, mild to moderate AV sclerosis no stenosis, ascending aorta 74m) and nuclear test 01-16-2020 (normal perfusion w/ normal LV and wall motion, nuclear ef 65%)  . Hyperglycemia 12/18/2019  . Hypogonadism in male   . Leukocytosis 09/29/2020  . Macular degeneration, right eye   . 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  . Mixed hyperlipidemia   . Onychomycosis 12/18/2019  . Osteoarthritis of knee 12/18/2019  . Pain in hand 12/18/2019  . Parkinson's disease 08/2020  .  Renal stone 12/18/2019  . Sebaceous cyst 12/18/2019  . Seizure-like activity 09/29/2020  . Spinal stenosis of lumbar region 08/20/2020  . 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/2019  . Toxic effect of venom of wasps 12/18/2019  . Vitamin B12 deficiency   . Vitamin D deficiency     Past Surgical History:  Procedure Laterality Date  . ANKLE SURGERY Right 10-04-2015  $[email or web address]'@[email or web address]$    debridement achilles tendon & reattachment and heel excision osteophyte  . CATARACT EXTRACTION W/ INTRAOCULAR LENS  IMPLANT, BILATERAL  yrs ago  . CYSTOSCOPY WITH INSERTION OF UROLIFT  2019 approx.  Marland Kitchen SHOULDER SURGERY Right 1990's  .  TONSILLECTOMY  age 32  . TRANSURETHRAL RESECTION OF PROSTATE N/A 09/23/2020   Procedure: TRANSURETHRAL RESECTION OF THE PROSTATE (TURP);  Surgeon: [name], MD;  Location: [facility];  Service: Urology;  Laterality: N/A;  75 MINS    There were no vitals filed for this visit.   Subjective Assessment - 12/23/20 1402    Subjective Nothing new today.  No falls.  Used my stationary bike.  Working out nicely.    Pertinent History macular degeneration with difficulty with depth perception    Patient Stated Goals Pt's goal for therapy is to do everything he wants to do, keep strength up.    Currently in Pain? No/denies                   Reviewed the following, with pt return demo understanding:  Performed PWR Hands with arms extended: PWR! Up PWR! [employer]! Twist PWR! Step   Performed x 5 reps each, Reviewed purpose of performing and how it relates to functional activity. For pt to perform daily and when he has difficulties with more fine motor/coordination tasks     Pt performs PWR! Moves in standing position x 20 reps   PWR! Up for improved posture   PWR! Rock for improved weighshifting - initial cues to look up at hands.    PWR! Twist for improved trunk rotation-intermittent cues to stop in midline before twisting to opposite side.    PWR! Step for improved step initiation-cues for foot clearance  Rates effort with PWR! Moves as 6-7/10           [facility] Adult PT Treatment/Exercise - 12/23/20 1405      Transfers   Transfers Sit to Stand;Stand to Sit    Sit to Stand 6: Modified independent (Device/Increase time)    Five time sit to stand comments  7.1    Stand to Sit 6: Modified independent (Device/Increase time)      Ambulation/Gait   Ambulation/Gait Yes    Ambulation/Gait Assistance 6: Modified independent (Device/Increase time)    Ambulation Distance (Feet) 830 Feet    Assistive device None    Gait Pattern Step-through  pattern;Decreased arm swing - right;Decreased arm swing - left;Decreased step length - right;Decreased step length - left;Decreased trunk rotation    Gait velocity 8.75 sec = 3.75 ft/sec      Standardized Balance Assessment   Standardized Balance Assessment Timed Up and Go Test      Timed Up and Go Test   TUG Normal TUG    Normal TUG (seconds) 10.46      Self-Care   Self-Care Other Self-Care Comments    Other Self-Care Comments  Reviewed optimal fitness program post d/c from PT. Discussed OT eval on chart (pt prefers to wait until he gets back from out of town this summer); discussed return PT eval, encouraging  pt to schedule both of these today, but pt does not have calendar and prefers to wait to call back.  Discussed POC, progress towards goals, and plans for d/c this visit.                  PT Education - 12/24/20 1358    Education Details See self-care    Person(s) Educated Patient    Methods Explanation    Comprehension Verbalized understanding            PT Short Term Goals - 12/16/20 0925      PT SHORT TERM GOAL #1   Title ALL STGS = LTGS             PT Long Term Goals - 12/24/20 1359      PT LONG TERM GOAL #1   Title Pt will perform HEP with supervision for improved strength, balance, transfers, and gait and verbalize understanding of PD fitness plan s/p D/C.   TARGET 12/24/20    Time 1    Period Weeks    Status Achieved                 Plan - 12/24/20 1400    Clinical Impression Statement Reviewed HEP and optimal community fitness today, with pt return demo/verbalizing understanding.  He has met LTG 1 (only remaining LTG) and is apporpriate for d/c today.  He has demonstrated improved funcitonal measures throughout the course of therapy, and he has been educated in ongoing fitness options for optimal fitness post-discharge.    Personal Factors and Comorbidities Comorbidity 3+    Comorbidities PMH:  chronic back pain, DDD, fatigue, GERD,  glaucoma, hx of syncope, OA,    Examination-Activity Limitations Locomotion Level;Transfers;Stairs    Examination-Participation Restrictions Community Activity    Stability/Clinical Decision Making Evolving/Moderate complexity    Rehab Potential Good    PT Frequency 2x / week    PT Duration --   1 week   PT Treatment/Interventions ADLs/Self Care Home Management;Gait training;Stair training;Functional mobility training;Therapeutic activities;Therapeutic exercise;Balance training;Neuromuscular re-education;DME Instruction;Patient/family education    PT Next Visit Plan Plan for return PT eval in 6-9 months (requested pt schedule today, but pt did not have calendar and does not schedule)    PT Home Exercise Plan seated PWR moves. Access Code: [email or web address]    Consulted and Agree with Plan of Care Patient           Patient will benefit from skilled therapeutic intervention in order to improve the following deficits and impairments:  Abnormal gait,Difficulty walking,Decreased balance,Decreased mobility,Decreased strength,Postural dysfunction  Visit Diagnosis: Other abnormalities of gait and mobility  Unsteadiness on feet  Other symptoms and signs involving the nervous system     Problem List Patient Active Problem List   Diagnosis Date Noted  . 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  . Allergic rhinitis   . Chronic sinusitis   . Enlarged prostate   . Hardening of the aorta (main artery of the heart)   . Vitamin B12 deficiency   . Seizure-like activity 09/29/2020  . Parkinson's disease 09/29/2020  . Leukocytosis 09/29/2020  . Benign prostatic hyperplasia with lower urinary tract symptoms 09/23/2020  . Degeneration of lumbar intervertebral disc 08/20/2020  . Spinal stenosis of lumbar region 08/20/2020  . 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/2019  . Hemorrhoids 12/18/2019  . Vitamin D deficiency 12/18/2019  . Male erectile dysfunction 12/18/2019  . Osteoarthritis  of knee 12/18/2019  . Impaired fasting glucose 12/18/2019  . Gastroesophageal  reflux disease without esophagitis 12/18/2019  . Essential hypertension 12/18/2019  . Mixed hyperlipidemia 12/18/2019  . Fatigue 12/18/2019  . Pain in hand 12/18/2019  . Onychomycosis 12/18/2019  . Cervical spondylosis without myelopathy 12/18/2019  . Toxic effect of venom of wasps 12/18/2019  . Right flank pain 12/18/2019  . Family history of hemochromatosis 12/18/2019  . Pure hypercholesterolemia 12/18/2019  . Hyperglycemia 12/18/2019  . Shoulder joint pain 01/29/2019    Adilee Lemme W. 12/24/2020, 2:04 PM  Mady Haagensen, PT 12/24/20 2:09 PM Phone: [phone] Fax: [facility] [address] [place], [place], [address] Phone: [phone]   Fax:  [phone]  Name: Seth Carter MRN: [id number] Date of Birth: 11-24-1940

## 2021-01-10 ENCOUNTER — Other Ambulatory Visit: Payer: Self-pay

## 2021-01-10 ENCOUNTER — Encounter (INDEPENDENT_AMBULATORY_CARE_PROVIDER_SITE_OTHER): Payer: Medicare Other | Admitting: Ophthalmology

## 2021-01-10 DIAGNOSIS — H43813 Vitreous degeneration, bilateral: Secondary | ICD-10-CM

## 2021-01-10 DIAGNOSIS — D3131 Benign neoplasm of right choroid: Secondary | ICD-10-CM | POA: Diagnosis not present

## 2021-01-10 DIAGNOSIS — I10 Essential (primary) hypertension: Secondary | ICD-10-CM

## 2021-01-10 DIAGNOSIS — H33302 Unspecified retinal break, left eye: Secondary | ICD-10-CM | POA: Diagnosis not present

## 2021-01-10 DIAGNOSIS — H34832 Tributary (branch) retinal vein occlusion, left eye, with macular edema: Secondary | ICD-10-CM | POA: Diagnosis not present

## 2021-01-10 DIAGNOSIS — H353132 Nonexudative age-related macular degeneration, bilateral, intermediate dry stage: Secondary | ICD-10-CM

## 2021-01-10 DIAGNOSIS — H35033 Hypertensive retinopathy, bilateral: Secondary | ICD-10-CM | POA: Diagnosis not present

## 2021-01-12 DIAGNOSIS — E559 Vitamin D deficiency, unspecified: Secondary | ICD-10-CM | POA: Diagnosis not present

## 2021-01-12 DIAGNOSIS — I10 Essential (primary) hypertension: Secondary | ICD-10-CM | POA: Diagnosis not present

## 2021-01-12 DIAGNOSIS — K219 Gastro-esophageal reflux disease without esophagitis: Secondary | ICD-10-CM | POA: Diagnosis not present

## 2021-01-12 DIAGNOSIS — H409 Unspecified glaucoma: Secondary | ICD-10-CM | POA: Diagnosis not present

## 2021-01-12 DIAGNOSIS — G20 Parkinson's disease: Secondary | ICD-10-CM | POA: Diagnosis not present

## 2021-01-12 DIAGNOSIS — E538 Deficiency of other specified B group vitamins: Secondary | ICD-10-CM | POA: Diagnosis not present

## 2021-01-12 DIAGNOSIS — I951 Orthostatic hypotension: Secondary | ICD-10-CM | POA: Diagnosis not present

## 2021-01-12 DIAGNOSIS — R404 Transient alteration of awareness: Secondary | ICD-10-CM | POA: Diagnosis not present

## 2021-01-12 DIAGNOSIS — E782 Mixed hyperlipidemia: Secondary | ICD-10-CM | POA: Diagnosis not present

## 2021-01-12 NOTE — Telephone Encounter (Signed)
Daughter, Juliann Pulse called and lm regarding the dat scan. Would like a call back [phone]

## 2021-01-12 NOTE — Telephone Encounter (Signed)
Called Seth Carter and left a message for a call back. Need to inform Seth Carter that Dr. Carles Collet is ok with doing the Dat Scan order and that we will need patient to sign the Dat Scan form/consent.

## 2021-01-12 NOTE — Telephone Encounter (Signed)
You can enter the order - they will need to sign the form

## 2021-01-12 NOTE — Telephone Encounter (Signed)
Seth Carter calling back to get more information. Seth Carter stated her father had already signed something. [phone]

## 2021-01-12 NOTE — Telephone Encounter (Signed)
Called and spoke to patients daughter Seth Carter and informed her that last visit with [name] patient requested holding it until they saw allergist until they found out what he was allergic to and patient also requested a 2nd opinion from neuro at [facility] and that is next month.  Patients daughter informed me that patient has seen the allergist and the allergist stated that patient could have the Dat Scan and that he would prescribe the patient something incase of an allergic reaction. Patients daughter also stated that they are still going to [facility] next month for the second opinion.   Informed Seth Carter that I would send Dr. Carles Collet this message and give her a call once I hear back from her.

## 2021-01-13 NOTE — Telephone Encounter (Signed)
Called and spoke to patients daughter and informed her that I was unable to locate patients signed consent. Patients daughter stated that she will come by and pick up the form so that patient can sign consent. Consent has been placed up front. Office card with address was attached to consent so that patient has our address to mail consent back if needed.

## 2021-01-28 DIAGNOSIS — R350 Frequency of micturition: Secondary | ICD-10-CM | POA: Diagnosis not present

## 2021-01-28 DIAGNOSIS — R3915 Urgency of urination: Secondary | ICD-10-CM | POA: Diagnosis not present

## 2021-01-28 DIAGNOSIS — N401 Enlarged prostate with lower urinary tract symptoms: Secondary | ICD-10-CM | POA: Diagnosis not present

## 2021-01-31 ENCOUNTER — Telehealth: Payer: Self-pay | Admitting: Neurology

## 2021-01-31 DIAGNOSIS — G20 Parkinson's disease: Secondary | ICD-10-CM | POA: Diagnosis not present

## 2021-01-31 DIAGNOSIS — R4189 Other symptoms and signs involving cognitive functions and awareness: Secondary | ICD-10-CM | POA: Diagnosis not present

## 2021-01-31 NOTE — Telephone Encounter (Signed)
Pt daughter, Juliann Pulse would like call back regarding her fathers  DaT scan. She has some other questions that she would like answered.

## 2021-02-01 ENCOUNTER — Other Ambulatory Visit: Payer: Self-pay

## 2021-02-01 DIAGNOSIS — G20 Parkinson's disease: Secondary | ICD-10-CM

## 2021-02-01 NOTE — Telephone Encounter (Signed)
Contacted daughter

## 2021-02-01 NOTE — Telephone Encounter (Signed)
Called and spoke to patients daughter and she wanted to ensure that I received the signed form from patient. Informed Seth Carter that I did and it has been faxed, approved, and sent to [facility] to schedule. Seth Carter wanted to ensure Seth Carter gave her a call. I informed Seth Carter that I would send an e-mail to Seth Carter to let her know to reach out to her since both patient and wife has cognitive issues and she handles all appointments.   E-mail has been sent to Seth Carter to inform her to contact patients daughter Seth Carter at [phone].  Seth Carter had no further questions or concerns.

## 2021-02-09 ENCOUNTER — Telehealth: Payer: Self-pay | Admitting: Neurology

## 2021-02-09 NOTE — Telephone Encounter (Signed)
Called patients daughter Juliann Pulse and asked her what I could help her with. Juliann Pulse wanted to know Taylor's phone number so she could call her back because she did not understand the voicemail from [place]. I provided Juliann Pulse with Taylor's direct line [phone]. Juliann Pulse thanked me for the call and had no further questions or concerns.

## 2021-02-09 NOTE — Telephone Encounter (Signed)
Daughter Juliann Pulse called, wants to speak with mahina. Said she has not heard back from her. [phone]

## 2021-02-14 ENCOUNTER — Other Ambulatory Visit: Payer: Self-pay

## 2021-02-14 ENCOUNTER — Encounter (INDEPENDENT_AMBULATORY_CARE_PROVIDER_SITE_OTHER): Payer: Medicare Other | Admitting: Ophthalmology

## 2021-02-14 DIAGNOSIS — H43813 Vitreous degeneration, bilateral: Secondary | ICD-10-CM

## 2021-02-14 DIAGNOSIS — H35033 Hypertensive retinopathy, bilateral: Secondary | ICD-10-CM

## 2021-02-14 DIAGNOSIS — D3131 Benign neoplasm of right choroid: Secondary | ICD-10-CM

## 2021-02-14 DIAGNOSIS — I10 Essential (primary) hypertension: Secondary | ICD-10-CM

## 2021-02-14 DIAGNOSIS — H33302 Unspecified retinal break, left eye: Secondary | ICD-10-CM

## 2021-02-14 DIAGNOSIS — H34832 Tributary (branch) retinal vein occlusion, left eye, with macular edema: Secondary | ICD-10-CM | POA: Diagnosis not present

## 2021-02-14 DIAGNOSIS — H353132 Nonexudative age-related macular degeneration, bilateral, intermediate dry stage: Secondary | ICD-10-CM

## 2021-02-15 ENCOUNTER — Ambulatory Visit: Payer: Medicare Other | Admitting: Neurology

## 2021-02-21 DIAGNOSIS — G20 Parkinson's disease: Secondary | ICD-10-CM | POA: Diagnosis not present

## 2021-02-21 DIAGNOSIS — M4722 Other spondylosis with radiculopathy, cervical region: Secondary | ICD-10-CM | POA: Diagnosis not present

## 2021-02-21 DIAGNOSIS — K219 Gastro-esophageal reflux disease without esophagitis: Secondary | ICD-10-CM | POA: Diagnosis not present

## 2021-02-21 DIAGNOSIS — E7800 Pure hypercholesterolemia, unspecified: Secondary | ICD-10-CM | POA: Diagnosis not present

## 2021-02-21 DIAGNOSIS — E782 Mixed hyperlipidemia: Secondary | ICD-10-CM | POA: Diagnosis not present

## 2021-02-21 DIAGNOSIS — I10 Essential (primary) hypertension: Secondary | ICD-10-CM | POA: Diagnosis not present

## 2021-02-21 DIAGNOSIS — N401 Enlarged prostate with lower urinary tract symptoms: Secondary | ICD-10-CM | POA: Diagnosis not present

## 2021-02-21 DIAGNOSIS — N400 Enlarged prostate without lower urinary tract symptoms: Secondary | ICD-10-CM | POA: Diagnosis not present

## 2021-02-21 DIAGNOSIS — H409 Unspecified glaucoma: Secondary | ICD-10-CM | POA: Diagnosis not present

## 2021-02-21 DIAGNOSIS — M179 Osteoarthritis of knee, unspecified: Secondary | ICD-10-CM | POA: Diagnosis not present

## 2021-02-24 ENCOUNTER — Telehealth: Payer: Self-pay | Admitting: Allergy and Immunology

## 2021-02-24 NOTE — Telephone Encounter (Signed)
Pts daghter states pt will have DATAT SCAN on 8/23. Dr Neldon Mc advised him to let him know when his scan was so that he could prescribe something beofre the scan. Pt's daughter also wanted to let Dr. Neldon Mc know her dad is doing good on his meds for Parkinsons.

## 2021-02-25 NOTE — Telephone Encounter (Signed)
Please advise to what med to send in for pts scan

## 2021-02-28 NOTE — Telephone Encounter (Signed)
Please inform Seth Carter's daughter that there is a protocol use for radiocontrast media reactions and there are different protocols depending on who is performing the study.  This is an issue for the radiologist or the ordering physician to address.  It usually involves systemic steroids, prednisone, administered the day of and the day before the study is performed.  But it is really the ordering physician or the radiologist that should take hold of this issue.

## 2021-02-28 NOTE — Telephone Encounter (Signed)
Pts daughter infromed and stated understanding she will reach out to the ordering dr about this

## 2021-03-08 ENCOUNTER — Ambulatory Visit: Payer: Medicare Other | Admitting: Neurology

## 2021-03-15 ENCOUNTER — Other Ambulatory Visit: Payer: Self-pay

## 2021-03-15 ENCOUNTER — Encounter (HOSPITAL_COMMUNITY)
Admission: RE | Admit: 2021-03-15 | Discharge: 2021-03-15 | Disposition: A | Discharge status: Home / Self Care | Payer: Medicare Other | Source: Ambulatory Visit | Attending: Neurology | Admitting: Neurology

## 2021-03-15 ENCOUNTER — Ambulatory Visit (HOSPITAL_COMMUNITY)
Admission: RE | Admit: 2021-03-15 | Discharge: 2021-03-15 | Disposition: A | Discharge status: Home / Self Care | Payer: Medicare Other | Source: Ambulatory Visit | Attending: Neurology | Admitting: Neurology

## 2021-03-15 DIAGNOSIS — G20 Parkinson's disease: Secondary | ICD-10-CM | POA: Insufficient documentation

## 2021-03-15 DIAGNOSIS — R413 Other amnesia: Secondary | ICD-10-CM | POA: Diagnosis not present

## 2021-03-15 MED ORDER — IOFLUPANE I 123 185 MBQ/2.5ML IV SOLN
4.9000 | Freq: Once | INTRAVENOUS | Status: AC | PRN
  Administered 2021-03-15: 4.9 via INTRAVENOUS
  Filled 2021-03-15: qty 5

## 2021-03-29 ENCOUNTER — Encounter (INDEPENDENT_AMBULATORY_CARE_PROVIDER_SITE_OTHER): Payer: Medicare Other | Admitting: Ophthalmology

## 2021-03-29 ENCOUNTER — Other Ambulatory Visit: Payer: Self-pay

## 2021-03-29 DIAGNOSIS — H43813 Vitreous degeneration, bilateral: Secondary | ICD-10-CM

## 2021-03-29 DIAGNOSIS — H353132 Nonexudative age-related macular degeneration, bilateral, intermediate dry stage: Secondary | ICD-10-CM | POA: Diagnosis not present

## 2021-03-29 DIAGNOSIS — H35033 Hypertensive retinopathy, bilateral: Secondary | ICD-10-CM | POA: Diagnosis not present

## 2021-03-29 DIAGNOSIS — H34832 Tributary (branch) retinal vein occlusion, left eye, with macular edema: Secondary | ICD-10-CM | POA: Diagnosis not present

## 2021-03-29 DIAGNOSIS — D3131 Benign neoplasm of right choroid: Secondary | ICD-10-CM | POA: Diagnosis not present

## 2021-03-29 DIAGNOSIS — H33302 Unspecified retinal break, left eye: Secondary | ICD-10-CM

## 2021-03-29 DIAGNOSIS — I10 Essential (primary) hypertension: Secondary | ICD-10-CM

## 2021-04-04 DIAGNOSIS — R4189 Other symptoms and signs involving cognitive functions and awareness: Secondary | ICD-10-CM | POA: Diagnosis not present

## 2021-04-04 DIAGNOSIS — G20 Parkinson's disease: Secondary | ICD-10-CM | POA: Diagnosis not present

## 2021-04-04 DIAGNOSIS — Z23 Encounter for immunization: Secondary | ICD-10-CM | POA: Diagnosis not present

## 2021-04-14 DIAGNOSIS — I951 Orthostatic hypotension: Secondary | ICD-10-CM | POA: Diagnosis not present

## 2021-04-14 DIAGNOSIS — G20 Parkinson's disease: Secondary | ICD-10-CM | POA: Diagnosis not present

## 2021-04-14 DIAGNOSIS — K219 Gastro-esophageal reflux disease without esophagitis: Secondary | ICD-10-CM | POA: Diagnosis not present

## 2021-04-14 DIAGNOSIS — E538 Deficiency of other specified B group vitamins: Secondary | ICD-10-CM | POA: Diagnosis not present

## 2021-04-14 DIAGNOSIS — R404 Transient alteration of awareness: Secondary | ICD-10-CM | POA: Diagnosis not present

## 2021-04-14 DIAGNOSIS — H409 Unspecified glaucoma: Secondary | ICD-10-CM | POA: Diagnosis not present

## 2021-04-14 DIAGNOSIS — I10 Essential (primary) hypertension: Secondary | ICD-10-CM | POA: Diagnosis not present

## 2021-04-14 DIAGNOSIS — Z0000 Encounter for general adult medical examination without abnormal findings: Secondary | ICD-10-CM | POA: Diagnosis not present

## 2021-04-14 DIAGNOSIS — E782 Mixed hyperlipidemia: Secondary | ICD-10-CM | POA: Diagnosis not present

## 2021-04-14 DIAGNOSIS — E559 Vitamin D deficiency, unspecified: Secondary | ICD-10-CM | POA: Diagnosis not present

## 2021-04-14 DIAGNOSIS — Z1389 Encounter for screening for other disorder: Secondary | ICD-10-CM | POA: Diagnosis not present

## 2021-04-14 DIAGNOSIS — Z23 Encounter for immunization: Secondary | ICD-10-CM | POA: Diagnosis not present

## 2021-05-10 DIAGNOSIS — H04123 Dry eye syndrome of bilateral lacrimal glands: Secondary | ICD-10-CM | POA: Diagnosis not present

## 2021-05-10 DIAGNOSIS — Z961 Presence of intraocular lens: Secondary | ICD-10-CM | POA: Diagnosis not present

## 2021-05-10 DIAGNOSIS — H401131 Primary open-angle glaucoma, bilateral, mild stage: Secondary | ICD-10-CM | POA: Diagnosis not present

## 2021-05-16 ENCOUNTER — Encounter (INDEPENDENT_AMBULATORY_CARE_PROVIDER_SITE_OTHER): Payer: Medicare Other | Admitting: Ophthalmology

## 2021-05-16 ENCOUNTER — Other Ambulatory Visit: Payer: Self-pay

## 2021-05-16 DIAGNOSIS — H33302 Unspecified retinal break, left eye: Secondary | ICD-10-CM | POA: Diagnosis not present

## 2021-05-16 DIAGNOSIS — H35033 Hypertensive retinopathy, bilateral: Secondary | ICD-10-CM | POA: Diagnosis not present

## 2021-05-16 DIAGNOSIS — H34832 Tributary (branch) retinal vein occlusion, left eye, with macular edema: Secondary | ICD-10-CM | POA: Diagnosis not present

## 2021-05-16 DIAGNOSIS — H353132 Nonexudative age-related macular degeneration, bilateral, intermediate dry stage: Secondary | ICD-10-CM

## 2021-05-16 DIAGNOSIS — I10 Essential (primary) hypertension: Secondary | ICD-10-CM

## 2021-05-16 DIAGNOSIS — D3131 Benign neoplasm of right choroid: Secondary | ICD-10-CM

## 2021-05-16 DIAGNOSIS — H43813 Vitreous degeneration, bilateral: Secondary | ICD-10-CM

## 2021-05-20 ENCOUNTER — Ambulatory Visit: Payer: Medicare Other | Admitting: Neurology

## 2021-05-27 DIAGNOSIS — N401 Enlarged prostate with lower urinary tract symptoms: Secondary | ICD-10-CM | POA: Diagnosis not present

## 2021-05-27 DIAGNOSIS — I10 Essential (primary) hypertension: Secondary | ICD-10-CM | POA: Diagnosis not present

## 2021-05-27 DIAGNOSIS — M4722 Other spondylosis with radiculopathy, cervical region: Secondary | ICD-10-CM | POA: Diagnosis not present

## 2021-05-27 DIAGNOSIS — N400 Enlarged prostate without lower urinary tract symptoms: Secondary | ICD-10-CM | POA: Diagnosis not present

## 2021-05-27 DIAGNOSIS — G20 Parkinson's disease: Secondary | ICD-10-CM | POA: Diagnosis not present

## 2021-05-27 DIAGNOSIS — K219 Gastro-esophageal reflux disease without esophagitis: Secondary | ICD-10-CM | POA: Diagnosis not present

## 2021-05-27 DIAGNOSIS — H409 Unspecified glaucoma: Secondary | ICD-10-CM | POA: Diagnosis not present

## 2021-05-27 DIAGNOSIS — E7800 Pure hypercholesterolemia, unspecified: Secondary | ICD-10-CM | POA: Diagnosis not present

## 2021-05-27 DIAGNOSIS — E782 Mixed hyperlipidemia: Secondary | ICD-10-CM | POA: Diagnosis not present

## 2021-06-27 DIAGNOSIS — G20 Parkinson's disease: Secondary | ICD-10-CM | POA: Diagnosis not present

## 2021-06-27 DIAGNOSIS — R4189 Other symptoms and signs involving cognitive functions and awareness: Secondary | ICD-10-CM | POA: Diagnosis not present

## 2021-07-04 ENCOUNTER — Other Ambulatory Visit: Payer: Self-pay

## 2021-07-04 ENCOUNTER — Encounter (INDEPENDENT_AMBULATORY_CARE_PROVIDER_SITE_OTHER): Payer: Medicare Other | Admitting: Ophthalmology

## 2021-07-04 DIAGNOSIS — H353132 Nonexudative age-related macular degeneration, bilateral, intermediate dry stage: Secondary | ICD-10-CM | POA: Diagnosis not present

## 2021-07-04 DIAGNOSIS — H43813 Vitreous degeneration, bilateral: Secondary | ICD-10-CM | POA: Diagnosis not present

## 2021-07-04 DIAGNOSIS — H34832 Tributary (branch) retinal vein occlusion, left eye, with macular edema: Secondary | ICD-10-CM

## 2021-07-04 DIAGNOSIS — I10 Essential (primary) hypertension: Secondary | ICD-10-CM

## 2021-07-04 DIAGNOSIS — D3131 Benign neoplasm of right choroid: Secondary | ICD-10-CM | POA: Diagnosis not present

## 2021-07-04 DIAGNOSIS — H33302 Unspecified retinal break, left eye: Secondary | ICD-10-CM | POA: Diagnosis not present

## 2021-07-04 DIAGNOSIS — H35033 Hypertensive retinopathy, bilateral: Secondary | ICD-10-CM | POA: Diagnosis not present

## 2021-08-18 DIAGNOSIS — S51012A Laceration without foreign body of left elbow, initial encounter: Secondary | ICD-10-CM | POA: Diagnosis not present

## 2021-08-18 DIAGNOSIS — S51812A Laceration without foreign body of left forearm, initial encounter: Secondary | ICD-10-CM | POA: Diagnosis not present

## 2021-08-18 DIAGNOSIS — M25562 Pain in left knee: Secondary | ICD-10-CM | POA: Diagnosis not present

## 2021-08-19 DIAGNOSIS — M1712 Unilateral primary osteoarthritis, left knee: Secondary | ICD-10-CM | POA: Diagnosis not present

## 2021-08-26 DIAGNOSIS — R3915 Urgency of urination: Secondary | ICD-10-CM | POA: Diagnosis not present

## 2021-08-26 DIAGNOSIS — N5201 Erectile dysfunction due to arterial insufficiency: Secondary | ICD-10-CM | POA: Diagnosis not present

## 2021-08-26 DIAGNOSIS — R351 Nocturia: Secondary | ICD-10-CM | POA: Diagnosis not present

## 2021-08-26 DIAGNOSIS — N401 Enlarged prostate with lower urinary tract symptoms: Secondary | ICD-10-CM | POA: Diagnosis not present

## 2021-08-29 ENCOUNTER — Other Ambulatory Visit: Payer: Self-pay

## 2021-08-29 ENCOUNTER — Encounter (INDEPENDENT_AMBULATORY_CARE_PROVIDER_SITE_OTHER): Payer: Medicare Other | Admitting: Ophthalmology

## 2021-08-29 DIAGNOSIS — H35033 Hypertensive retinopathy, bilateral: Secondary | ICD-10-CM

## 2021-08-29 DIAGNOSIS — D3131 Benign neoplasm of right choroid: Secondary | ICD-10-CM

## 2021-08-29 DIAGNOSIS — I10 Essential (primary) hypertension: Secondary | ICD-10-CM | POA: Diagnosis not present

## 2021-08-29 DIAGNOSIS — H33302 Unspecified retinal break, left eye: Secondary | ICD-10-CM | POA: Diagnosis not present

## 2021-08-29 DIAGNOSIS — H353132 Nonexudative age-related macular degeneration, bilateral, intermediate dry stage: Secondary | ICD-10-CM | POA: Diagnosis not present

## 2021-08-29 DIAGNOSIS — H43813 Vitreous degeneration, bilateral: Secondary | ICD-10-CM | POA: Diagnosis not present

## 2021-08-29 DIAGNOSIS — H34832 Tributary (branch) retinal vein occlusion, left eye, with macular edema: Secondary | ICD-10-CM

## 2021-08-31 DIAGNOSIS — M25562 Pain in left knee: Secondary | ICD-10-CM | POA: Diagnosis not present

## 2021-09-13 DIAGNOSIS — R42 Dizziness and giddiness: Secondary | ICD-10-CM | POA: Diagnosis not present

## 2021-09-13 DIAGNOSIS — R413 Other amnesia: Secondary | ICD-10-CM | POA: Diagnosis not present

## 2021-09-13 DIAGNOSIS — I10 Essential (primary) hypertension: Secondary | ICD-10-CM | POA: Diagnosis not present

## 2021-09-13 DIAGNOSIS — E782 Mixed hyperlipidemia: Secondary | ICD-10-CM | POA: Diagnosis not present

## 2021-09-13 DIAGNOSIS — G20 Parkinson's disease: Secondary | ICD-10-CM | POA: Diagnosis not present

## 2021-09-14 DIAGNOSIS — E782 Mixed hyperlipidemia: Secondary | ICD-10-CM | POA: Diagnosis not present

## 2021-09-14 DIAGNOSIS — I10 Essential (primary) hypertension: Secondary | ICD-10-CM | POA: Diagnosis not present

## 2021-10-04 DIAGNOSIS — X32XXXA Exposure to sunlight, initial encounter: Secondary | ICD-10-CM | POA: Diagnosis not present

## 2021-10-04 DIAGNOSIS — B078 Other viral warts: Secondary | ICD-10-CM | POA: Diagnosis not present

## 2021-10-04 DIAGNOSIS — L820 Inflamed seborrheic keratosis: Secondary | ICD-10-CM | POA: Diagnosis not present

## 2021-10-04 DIAGNOSIS — L570 Actinic keratosis: Secondary | ICD-10-CM | POA: Diagnosis not present

## 2021-10-04 DIAGNOSIS — D225 Melanocytic nevi of trunk: Secondary | ICD-10-CM | POA: Diagnosis not present

## 2021-10-13 DIAGNOSIS — I10 Essential (primary) hypertension: Secondary | ICD-10-CM | POA: Diagnosis not present

## 2021-10-13 DIAGNOSIS — R42 Dizziness and giddiness: Secondary | ICD-10-CM | POA: Diagnosis not present

## 2021-10-13 DIAGNOSIS — G20 Parkinson's disease: Secondary | ICD-10-CM | POA: Diagnosis not present

## 2021-10-13 DIAGNOSIS — R413 Other amnesia: Secondary | ICD-10-CM | POA: Diagnosis not present

## 2021-10-13 DIAGNOSIS — E782 Mixed hyperlipidemia: Secondary | ICD-10-CM | POA: Diagnosis not present

## 2021-10-13 DIAGNOSIS — R55 Syncope and collapse: Secondary | ICD-10-CM | POA: Diagnosis not present

## 2021-10-24 ENCOUNTER — Encounter (INDEPENDENT_AMBULATORY_CARE_PROVIDER_SITE_OTHER): Payer: Medicare Other | Admitting: Ophthalmology

## 2021-10-24 DIAGNOSIS — H353132 Nonexudative age-related macular degeneration, bilateral, intermediate dry stage: Secondary | ICD-10-CM | POA: Diagnosis not present

## 2021-10-24 DIAGNOSIS — H43813 Vitreous degeneration, bilateral: Secondary | ICD-10-CM | POA: Diagnosis not present

## 2021-10-24 DIAGNOSIS — H35033 Hypertensive retinopathy, bilateral: Secondary | ICD-10-CM | POA: Diagnosis not present

## 2021-10-24 DIAGNOSIS — H33302 Unspecified retinal break, left eye: Secondary | ICD-10-CM | POA: Diagnosis not present

## 2021-10-24 DIAGNOSIS — D3131 Benign neoplasm of right choroid: Secondary | ICD-10-CM | POA: Diagnosis not present

## 2021-10-24 DIAGNOSIS — H34832 Tributary (branch) retinal vein occlusion, left eye, with macular edema: Secondary | ICD-10-CM | POA: Diagnosis not present

## 2021-10-24 DIAGNOSIS — I10 Essential (primary) hypertension: Secondary | ICD-10-CM

## 2021-11-03 DIAGNOSIS — R42 Dizziness and giddiness: Secondary | ICD-10-CM | POA: Diagnosis not present

## 2021-11-07 DIAGNOSIS — G903 Multi-system degeneration of the autonomic nervous system: Secondary | ICD-10-CM | POA: Diagnosis not present

## 2021-11-07 DIAGNOSIS — G20 Parkinson's disease: Secondary | ICD-10-CM | POA: Diagnosis not present

## 2021-11-07 DIAGNOSIS — R4189 Other symptoms and signs involving cognitive functions and awareness: Secondary | ICD-10-CM | POA: Diagnosis not present

## 2021-11-07 IMAGING — NM NM DATSCAN
2 series · 24 of 30 positions shown · non-contrast
Comparison: None.

CLINICAL DATA: 80-year-old male. Memory loss. Gait change.
Hypodensities per patient.

EXAM:
NUCLEAR MEDICINE BRAIN IMAGING WITH SPECT  (DaTscan )
TECHNIQUE: SPECT images of the brain were obtained after intravenous injection
of radiopharmaceutical. 4 hour post injection imaging. Appropriate
positioning.
RADIOPHARMACEUTICALS:  4.9 millicuries I 123 Ioflupane

[Series 1: brain spect · 4.14mm/px · 5 of 120 frames shown]
[frame 11/120  full-range]
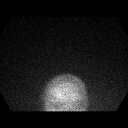
[frame 31/120  full-range]
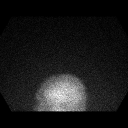
[frame 71/120  full-range]
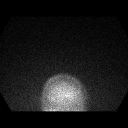
[frame 91/120  full-range]
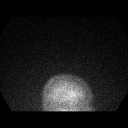
[frame 111/120  full-range]
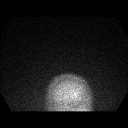

[Series 1016: mpr (id) range · 0.57mm/px · 19 of 35 slices shown]
[im 1/35]
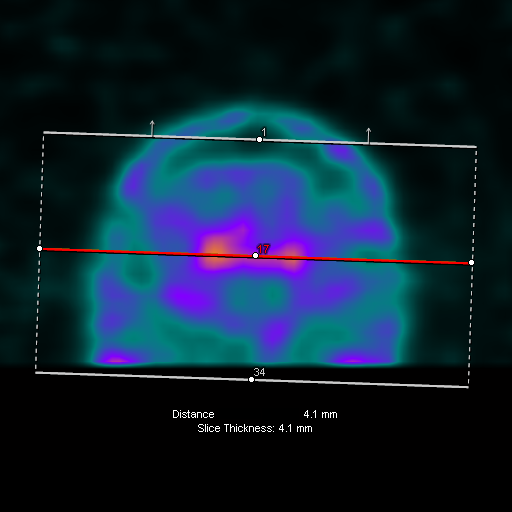
[im 3/35]
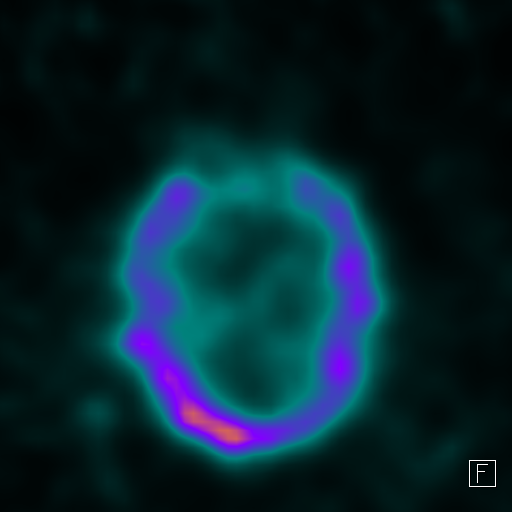
[im 5/35]
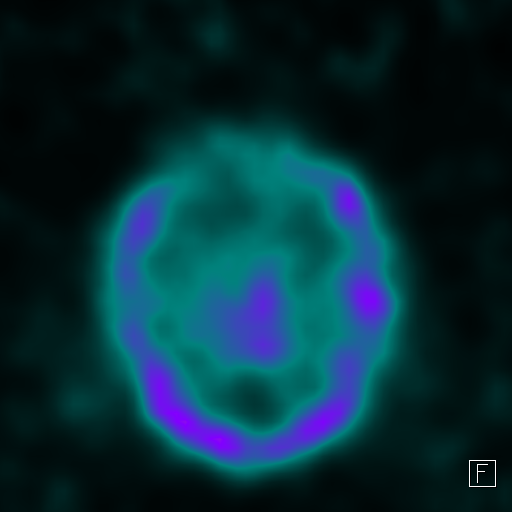
[im 6/35]
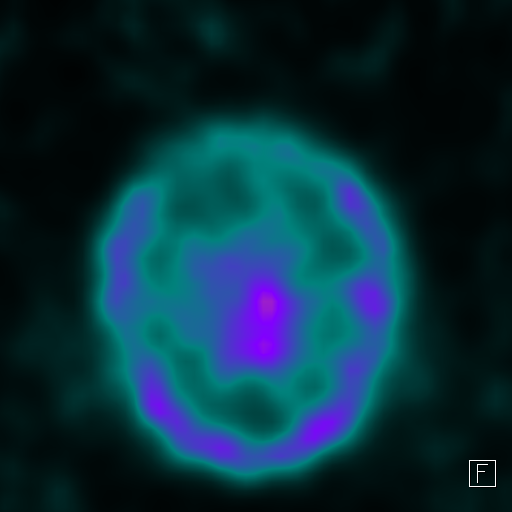
[im 8/35]
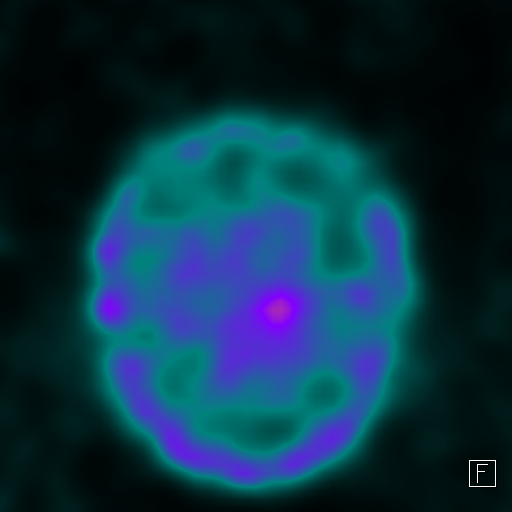
[im 11/35]
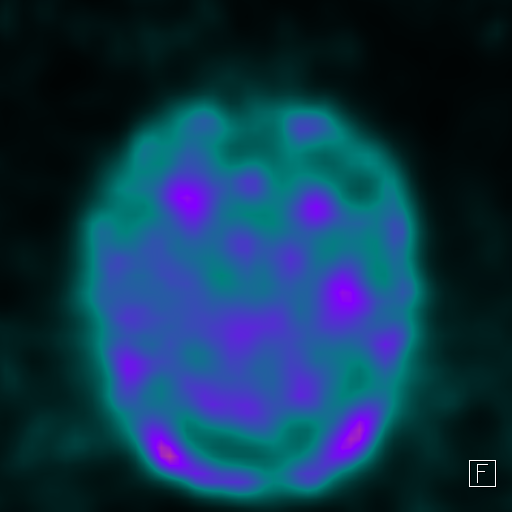
[im 12/35]
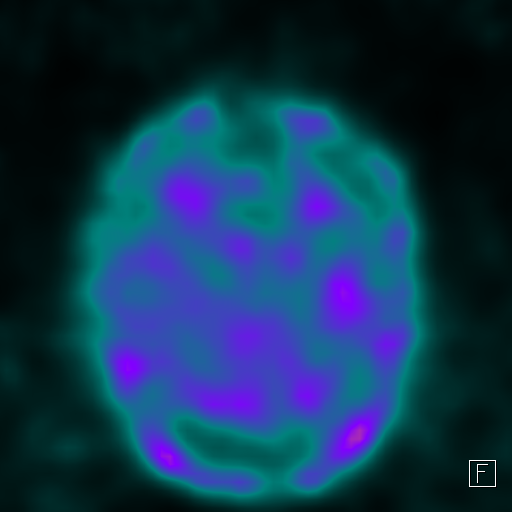
[im 14/35]
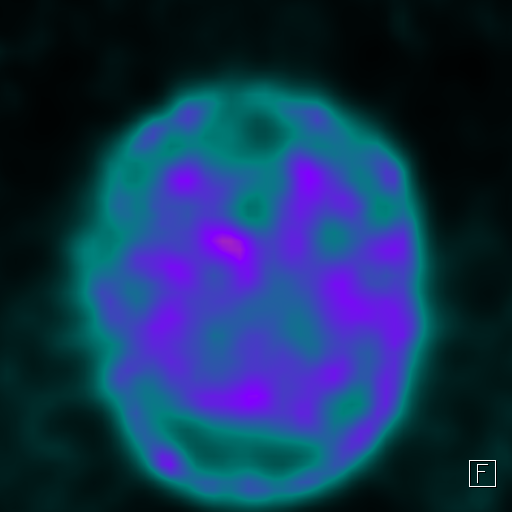
[im 15/35]
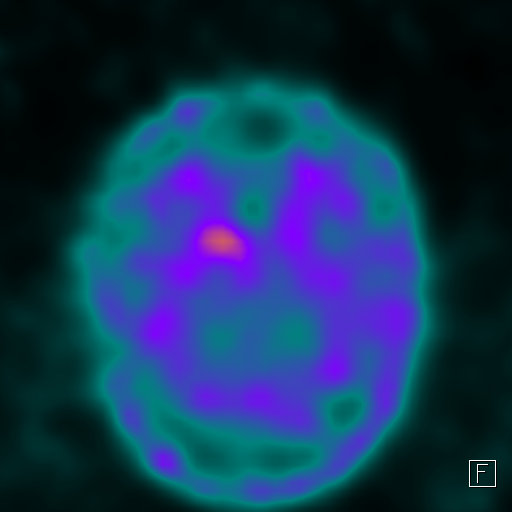
[im 18/35]
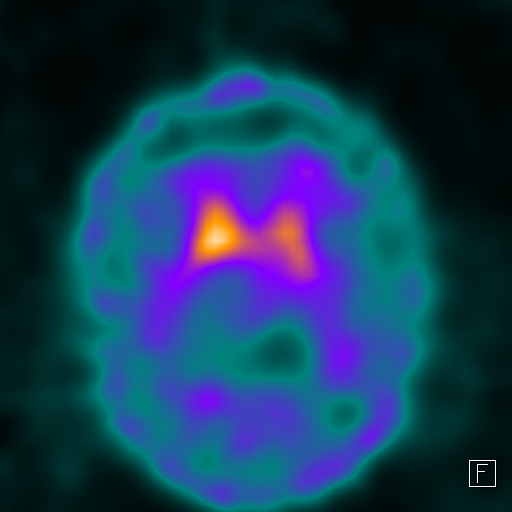
[im 20/35]
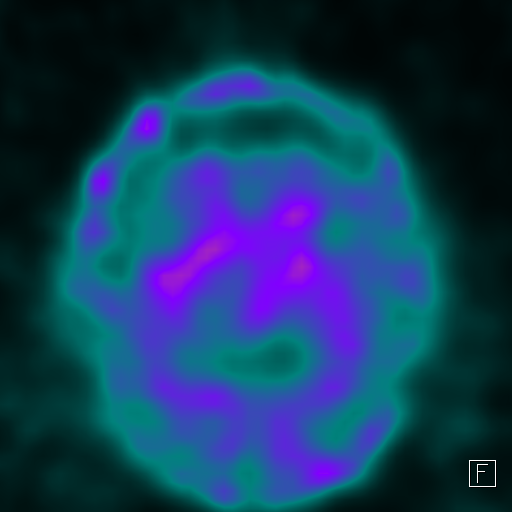
[im 21/35]
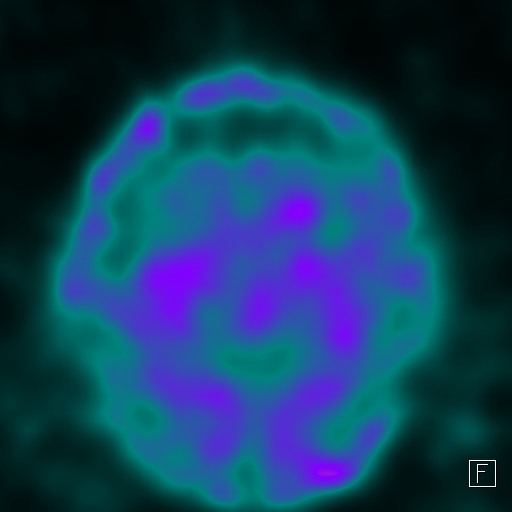
[im 23/35]
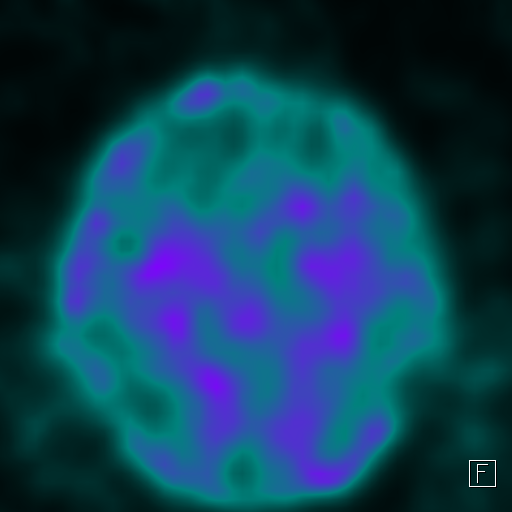
[im 26/35]
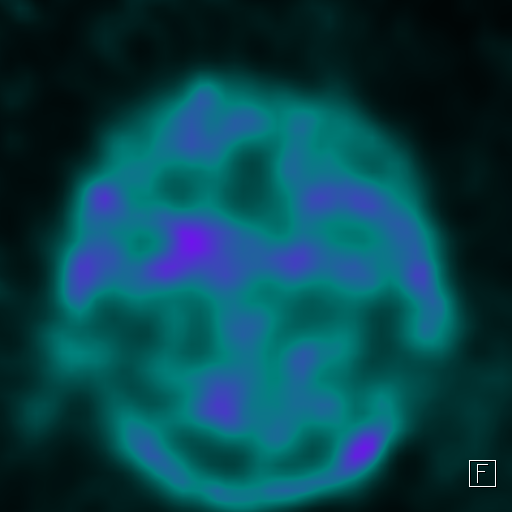
[im 27/35]
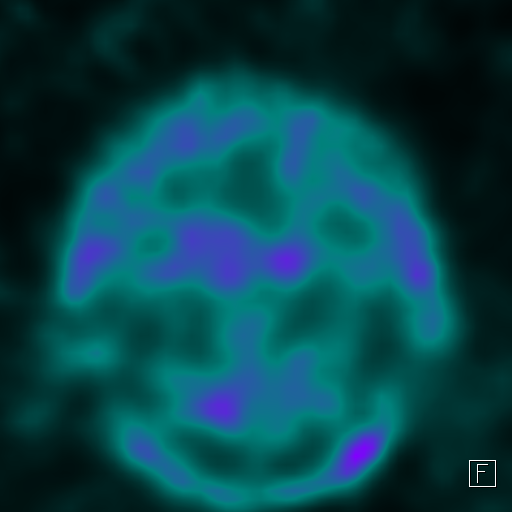
[im 29/35]
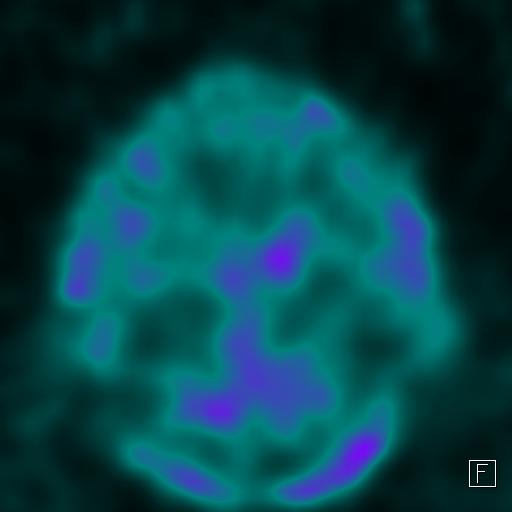
[im 30/35]
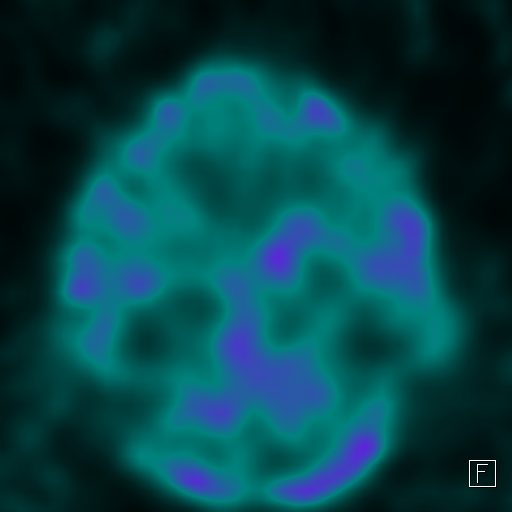
[im 33/35]
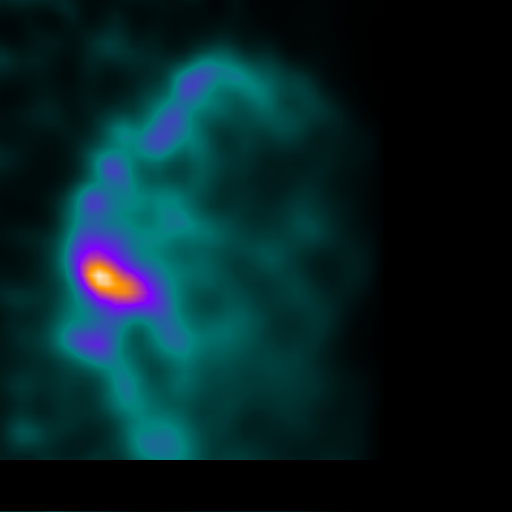
[im 35/35]
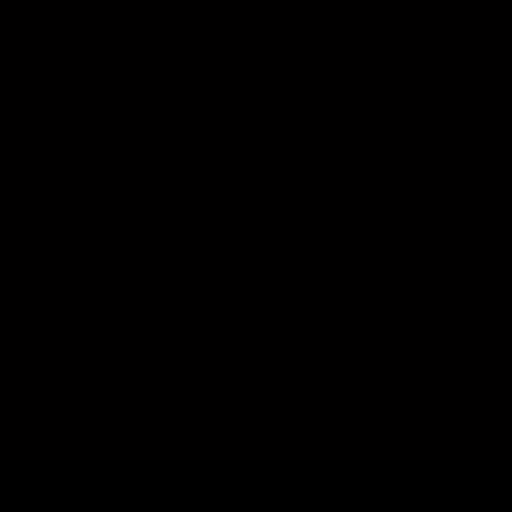

[24 of 30 positions shown; findings below may reference images not displayed]

FINDINGS: The striata are irregular-shaped. There is marked decreased
activity in the LEFT striatum involving the head of the caudate
nucleus and putamen. There is decreased activity in the posterior
RIGHT striatum. Maintained radiotracer activity in the RIGHT caudate
nucleus.
IMPRESSION: Striata are irregular-shaped with decreased radiotracer activity.
Loss of normal activity activity is greater on the LEFT. Findings
are suggestive of Parkinsonian syndrome pathology.

Of note, DaTSCAN is not diagnostic of Parkinsonian syndromes, which
remains a clinical diagnosis. DaTscan is an adjuvant test to aid in
the clinical diagnosis of Parkinsonian syndromes.

## 2021-11-09 DIAGNOSIS — D3131 Benign neoplasm of right choroid: Secondary | ICD-10-CM | POA: Diagnosis not present

## 2021-11-09 DIAGNOSIS — Z961 Presence of intraocular lens: Secondary | ICD-10-CM | POA: Diagnosis not present

## 2021-11-09 DIAGNOSIS — H04123 Dry eye syndrome of bilateral lacrimal glands: Secondary | ICD-10-CM | POA: Diagnosis not present

## 2021-11-09 DIAGNOSIS — H353131 Nonexudative age-related macular degeneration, bilateral, early dry stage: Secondary | ICD-10-CM | POA: Diagnosis not present

## 2021-11-09 DIAGNOSIS — H401121 Primary open-angle glaucoma, left eye, mild stage: Secondary | ICD-10-CM | POA: Diagnosis not present

## 2021-11-09 DIAGNOSIS — Q141 Congenital malformation of retina: Secondary | ICD-10-CM | POA: Diagnosis not present

## 2021-11-09 DIAGNOSIS — H401131 Primary open-angle glaucoma, bilateral, mild stage: Secondary | ICD-10-CM | POA: Diagnosis not present

## 2021-11-11 DIAGNOSIS — R42 Dizziness and giddiness: Secondary | ICD-10-CM | POA: Diagnosis not present

## 2021-11-25 DIAGNOSIS — M545 Low back pain, unspecified: Secondary | ICD-10-CM | POA: Diagnosis not present

## 2021-12-01 DIAGNOSIS — R42 Dizziness and giddiness: Secondary | ICD-10-CM | POA: Diagnosis not present

## 2021-12-01 DIAGNOSIS — G20 Parkinson's disease: Secondary | ICD-10-CM | POA: Diagnosis not present

## 2021-12-01 DIAGNOSIS — M5416 Radiculopathy, lumbar region: Secondary | ICD-10-CM | POA: Diagnosis not present

## 2021-12-01 DIAGNOSIS — E782 Mixed hyperlipidemia: Secondary | ICD-10-CM | POA: Diagnosis not present

## 2021-12-01 DIAGNOSIS — I10 Essential (primary) hypertension: Secondary | ICD-10-CM | POA: Diagnosis not present

## 2021-12-01 DIAGNOSIS — R413 Other amnesia: Secondary | ICD-10-CM | POA: Diagnosis not present

## 2021-12-06 DIAGNOSIS — M545 Low back pain, unspecified: Secondary | ICD-10-CM | POA: Diagnosis not present

## 2021-12-22 DIAGNOSIS — M5416 Radiculopathy, lumbar region: Secondary | ICD-10-CM | POA: Diagnosis not present

## 2021-12-26 ENCOUNTER — Encounter (INDEPENDENT_AMBULATORY_CARE_PROVIDER_SITE_OTHER): Payer: Medicare Other | Admitting: Ophthalmology

## 2021-12-26 DIAGNOSIS — I10 Essential (primary) hypertension: Secondary | ICD-10-CM

## 2021-12-26 DIAGNOSIS — H43813 Vitreous degeneration, bilateral: Secondary | ICD-10-CM

## 2021-12-26 DIAGNOSIS — D3131 Benign neoplasm of right choroid: Secondary | ICD-10-CM | POA: Diagnosis not present

## 2021-12-26 DIAGNOSIS — H33302 Unspecified retinal break, left eye: Secondary | ICD-10-CM | POA: Diagnosis not present

## 2021-12-26 DIAGNOSIS — H35033 Hypertensive retinopathy, bilateral: Secondary | ICD-10-CM | POA: Diagnosis not present

## 2021-12-26 DIAGNOSIS — H34832 Tributary (branch) retinal vein occlusion, left eye, with macular edema: Secondary | ICD-10-CM

## 2021-12-26 DIAGNOSIS — H353132 Nonexudative age-related macular degeneration, bilateral, intermediate dry stage: Secondary | ICD-10-CM | POA: Diagnosis not present

## 2021-12-29 DIAGNOSIS — M25511 Pain in right shoulder: Secondary | ICD-10-CM | POA: Diagnosis not present

## 2022-01-02 DIAGNOSIS — G20 Parkinson's disease: Secondary | ICD-10-CM | POA: Diagnosis not present

## 2022-01-02 DIAGNOSIS — G903 Multi-system degeneration of the autonomic nervous system: Secondary | ICD-10-CM | POA: Diagnosis not present

## 2022-01-02 DIAGNOSIS — R4189 Other symptoms and signs involving cognitive functions and awareness: Secondary | ICD-10-CM | POA: Diagnosis not present

## 2022-01-03 DIAGNOSIS — D72829 Elevated white blood cell count, unspecified: Secondary | ICD-10-CM | POA: Diagnosis not present

## 2022-01-06 DIAGNOSIS — M25511 Pain in right shoulder: Secondary | ICD-10-CM | POA: Diagnosis not present

## 2022-01-06 DIAGNOSIS — M545 Low back pain, unspecified: Secondary | ICD-10-CM | POA: Diagnosis not present

## 2022-01-27 DIAGNOSIS — M25511 Pain in right shoulder: Secondary | ICD-10-CM | POA: Diagnosis not present

## 2022-01-30 DIAGNOSIS — M5416 Radiculopathy, lumbar region: Secondary | ICD-10-CM | POA: Diagnosis not present

## 2022-01-30 DIAGNOSIS — G20 Parkinson's disease: Secondary | ICD-10-CM | POA: Diagnosis not present

## 2022-02-02 DIAGNOSIS — R4189 Other symptoms and signs involving cognitive functions and awareness: Secondary | ICD-10-CM | POA: Diagnosis not present

## 2022-02-02 DIAGNOSIS — G903 Multi-system degeneration of the autonomic nervous system: Secondary | ICD-10-CM | POA: Diagnosis not present

## 2022-02-02 DIAGNOSIS — G20 Parkinson's disease: Secondary | ICD-10-CM | POA: Diagnosis not present

## 2022-02-07 DIAGNOSIS — N401 Enlarged prostate with lower urinary tract symptoms: Secondary | ICD-10-CM | POA: Diagnosis not present

## 2022-02-07 DIAGNOSIS — N400 Enlarged prostate without lower urinary tract symptoms: Secondary | ICD-10-CM | POA: Diagnosis not present

## 2022-02-07 DIAGNOSIS — G20 Parkinson's disease: Secondary | ICD-10-CM | POA: Diagnosis not present

## 2022-02-07 DIAGNOSIS — I10 Essential (primary) hypertension: Secondary | ICD-10-CM | POA: Diagnosis not present

## 2022-02-07 DIAGNOSIS — K219 Gastro-esophageal reflux disease without esophagitis: Secondary | ICD-10-CM | POA: Diagnosis not present

## 2022-02-07 DIAGNOSIS — E782 Mixed hyperlipidemia: Secondary | ICD-10-CM | POA: Diagnosis not present

## 2022-02-23 DIAGNOSIS — M5416 Radiculopathy, lumbar region: Secondary | ICD-10-CM | POA: Diagnosis not present

## 2022-03-06 DIAGNOSIS — M25571 Pain in right ankle and joints of right foot: Secondary | ICD-10-CM | POA: Diagnosis not present

## 2022-03-06 DIAGNOSIS — G609 Hereditary and idiopathic neuropathy, unspecified: Secondary | ICD-10-CM | POA: Diagnosis not present

## 2022-03-06 DIAGNOSIS — S92504A Nondisplaced unspecified fracture of right lesser toe(s), initial encounter for closed fracture: Secondary | ICD-10-CM | POA: Diagnosis not present

## 2022-03-06 DIAGNOSIS — M79671 Pain in right foot: Secondary | ICD-10-CM | POA: Diagnosis not present

## 2022-03-20 ENCOUNTER — Encounter (INDEPENDENT_AMBULATORY_CARE_PROVIDER_SITE_OTHER): Payer: Medicare Other | Admitting: Ophthalmology

## 2022-03-20 DIAGNOSIS — H353132 Nonexudative age-related macular degeneration, bilateral, intermediate dry stage: Secondary | ICD-10-CM

## 2022-03-20 DIAGNOSIS — H348322 Tributary (branch) retinal vein occlusion, left eye, stable: Secondary | ICD-10-CM

## 2022-03-20 DIAGNOSIS — I10 Essential (primary) hypertension: Secondary | ICD-10-CM

## 2022-03-20 DIAGNOSIS — H43813 Vitreous degeneration, bilateral: Secondary | ICD-10-CM

## 2022-03-20 DIAGNOSIS — D3131 Benign neoplasm of right choroid: Secondary | ICD-10-CM

## 2022-03-20 DIAGNOSIS — H35033 Hypertensive retinopathy, bilateral: Secondary | ICD-10-CM | POA: Diagnosis not present

## 2022-03-20 DIAGNOSIS — H33302 Unspecified retinal break, left eye: Secondary | ICD-10-CM

## 2022-04-25 ENCOUNTER — Ambulatory Visit
Admission: RE | Admit: 2022-04-25 | Discharge: 2022-04-25 | Disposition: A | Discharge status: Home / Self Care | Payer: Medicare Other | Source: Ambulatory Visit | Attending: Internal Medicine | Admitting: Internal Medicine

## 2022-04-25 ENCOUNTER — Other Ambulatory Visit: Payer: Self-pay | Admitting: Internal Medicine

## 2022-04-25 DIAGNOSIS — R052 Subacute cough: Secondary | ICD-10-CM | POA: Diagnosis not present

## 2022-04-25 DIAGNOSIS — I10 Essential (primary) hypertension: Secondary | ICD-10-CM | POA: Diagnosis not present

## 2022-04-25 DIAGNOSIS — U071 COVID-19: Secondary | ICD-10-CM

## 2022-04-25 DIAGNOSIS — G20C Parkinsonism, unspecified: Secondary | ICD-10-CM | POA: Diagnosis not present

## 2022-04-25 DIAGNOSIS — R059 Cough, unspecified: Secondary | ICD-10-CM | POA: Diagnosis not present

## 2022-05-10 DIAGNOSIS — G903 Multi-system degeneration of the autonomic nervous system: Secondary | ICD-10-CM | POA: Diagnosis not present

## 2022-05-10 DIAGNOSIS — G20A1 Parkinson's disease without dyskinesia, without mention of fluctuations: Secondary | ICD-10-CM | POA: Diagnosis not present

## 2022-05-12 DIAGNOSIS — G20A2 Parkinson's disease without dyskinesia, with fluctuations: Secondary | ICD-10-CM | POA: Diagnosis not present

## 2022-05-12 DIAGNOSIS — D72829 Elevated white blood cell count, unspecified: Secondary | ICD-10-CM | POA: Diagnosis not present

## 2022-05-12 DIAGNOSIS — Z0000 Encounter for general adult medical examination without abnormal findings: Secondary | ICD-10-CM | POA: Diagnosis not present

## 2022-05-12 DIAGNOSIS — Z23 Encounter for immunization: Secondary | ICD-10-CM | POA: Diagnosis not present

## 2022-05-12 DIAGNOSIS — R7301 Impaired fasting glucose: Secondary | ICD-10-CM | POA: Diagnosis not present

## 2022-05-12 DIAGNOSIS — I10 Essential (primary) hypertension: Secondary | ICD-10-CM | POA: Diagnosis not present

## 2022-05-12 DIAGNOSIS — E538 Deficiency of other specified B group vitamins: Secondary | ICD-10-CM | POA: Diagnosis not present

## 2022-05-15 ENCOUNTER — Encounter (INDEPENDENT_AMBULATORY_CARE_PROVIDER_SITE_OTHER): Payer: Medicare Other | Admitting: Ophthalmology

## 2022-05-15 DIAGNOSIS — I10 Essential (primary) hypertension: Secondary | ICD-10-CM | POA: Diagnosis not present

## 2022-05-15 DIAGNOSIS — H35033 Hypertensive retinopathy, bilateral: Secondary | ICD-10-CM

## 2022-05-15 DIAGNOSIS — H353132 Nonexudative age-related macular degeneration, bilateral, intermediate dry stage: Secondary | ICD-10-CM

## 2022-05-15 DIAGNOSIS — H43813 Vitreous degeneration, bilateral: Secondary | ICD-10-CM | POA: Diagnosis not present

## 2022-05-15 DIAGNOSIS — H348322 Tributary (branch) retinal vein occlusion, left eye, stable: Secondary | ICD-10-CM

## 2022-06-21 DIAGNOSIS — E538 Deficiency of other specified B group vitamins: Secondary | ICD-10-CM | POA: Diagnosis not present

## 2022-06-21 DIAGNOSIS — R109 Unspecified abdominal pain: Secondary | ICD-10-CM | POA: Diagnosis not present

## 2022-06-21 DIAGNOSIS — N200 Calculus of kidney: Secondary | ICD-10-CM | POA: Diagnosis not present

## 2022-07-10 ENCOUNTER — Encounter (INDEPENDENT_AMBULATORY_CARE_PROVIDER_SITE_OTHER): Payer: Medicare Other | Admitting: Ophthalmology

## 2022-07-10 DIAGNOSIS — H35033 Hypertensive retinopathy, bilateral: Secondary | ICD-10-CM | POA: Diagnosis not present

## 2022-07-10 DIAGNOSIS — H33302 Unspecified retinal break, left eye: Secondary | ICD-10-CM | POA: Diagnosis not present

## 2022-07-10 DIAGNOSIS — H353132 Nonexudative age-related macular degeneration, bilateral, intermediate dry stage: Secondary | ICD-10-CM | POA: Diagnosis not present

## 2022-07-10 DIAGNOSIS — H43813 Vitreous degeneration, bilateral: Secondary | ICD-10-CM | POA: Diagnosis not present

## 2022-07-10 DIAGNOSIS — I10 Essential (primary) hypertension: Secondary | ICD-10-CM

## 2022-07-10 DIAGNOSIS — D3131 Benign neoplasm of right choroid: Secondary | ICD-10-CM | POA: Diagnosis not present

## 2022-07-10 DIAGNOSIS — H348322 Tributary (branch) retinal vein occlusion, left eye, stable: Secondary | ICD-10-CM | POA: Diagnosis not present

## 2022-08-13 DIAGNOSIS — G20A1 Parkinson's disease without dyskinesia, without mention of fluctuations: Secondary | ICD-10-CM | POA: Diagnosis not present

## 2022-08-13 DIAGNOSIS — R9389 Abnormal findings on diagnostic imaging of other specified body structures: Secondary | ICD-10-CM | POA: Diagnosis not present

## 2022-08-13 DIAGNOSIS — I10 Essential (primary) hypertension: Secondary | ICD-10-CM | POA: Diagnosis not present

## 2022-08-13 DIAGNOSIS — I6502 Occlusion and stenosis of left vertebral artery: Secondary | ICD-10-CM | POA: Diagnosis not present

## 2022-08-13 DIAGNOSIS — R109 Unspecified abdominal pain: Secondary | ICD-10-CM | POA: Diagnosis not present

## 2022-08-13 DIAGNOSIS — G20C Parkinsonism, unspecified: Secondary | ICD-10-CM | POA: Diagnosis not present

## 2022-08-13 DIAGNOSIS — R058 Other specified cough: Secondary | ICD-10-CM | POA: Diagnosis not present

## 2022-08-13 DIAGNOSIS — I672 Cerebral atherosclerosis: Secondary | ICD-10-CM | POA: Diagnosis not present

## 2022-08-13 DIAGNOSIS — I6623 Occlusion and stenosis of bilateral posterior cerebral arteries: Secondary | ICD-10-CM | POA: Diagnosis not present

## 2022-08-13 DIAGNOSIS — I639 Cerebral infarction, unspecified: Secondary | ICD-10-CM | POA: Diagnosis not present

## 2022-08-13 DIAGNOSIS — R531 Weakness: Secondary | ICD-10-CM | POA: Diagnosis not present

## 2022-08-13 DIAGNOSIS — I6389 Other cerebral infarction: Secondary | ICD-10-CM | POA: Diagnosis not present

## 2022-08-13 DIAGNOSIS — B974 Respiratory syncytial virus as the cause of diseases classified elsewhere: Secondary | ICD-10-CM | POA: Diagnosis not present

## 2022-08-13 DIAGNOSIS — Z743 Need for continuous supervision: Secondary | ICD-10-CM | POA: Diagnosis not present

## 2022-08-13 DIAGNOSIS — R059 Cough, unspecified: Secondary | ICD-10-CM | POA: Diagnosis not present

## 2022-08-13 DIAGNOSIS — I6381 Other cerebral infarction due to occlusion or stenosis of small artery: Secondary | ICD-10-CM | POA: Diagnosis not present

## 2022-08-13 DIAGNOSIS — Z79899 Other long term (current) drug therapy: Secondary | ICD-10-CM | POA: Diagnosis not present

## 2022-08-13 DIAGNOSIS — G459 Transient cerebral ischemic attack, unspecified: Secondary | ICD-10-CM | POA: Diagnosis not present

## 2022-08-13 DIAGNOSIS — I6782 Cerebral ischemia: Secondary | ICD-10-CM | POA: Diagnosis not present

## 2022-08-13 DIAGNOSIS — B338 Other specified viral diseases: Secondary | ICD-10-CM | POA: Diagnosis not present

## 2022-08-13 DIAGNOSIS — R42 Dizziness and giddiness: Secondary | ICD-10-CM | POA: Diagnosis not present

## 2022-08-13 DIAGNOSIS — R55 Syncope and collapse: Secondary | ICD-10-CM | POA: Diagnosis not present

## 2022-08-13 DIAGNOSIS — I6523 Occlusion and stenosis of bilateral carotid arteries: Secondary | ICD-10-CM | POA: Diagnosis not present

## 2022-08-14 DIAGNOSIS — I6389 Other cerebral infarction: Secondary | ICD-10-CM | POA: Diagnosis not present

## 2022-08-14 DIAGNOSIS — R479 Unspecified speech disturbances: Secondary | ICD-10-CM | POA: Diagnosis not present

## 2022-08-14 DIAGNOSIS — R42 Dizziness and giddiness: Secondary | ICD-10-CM | POA: Diagnosis not present

## 2022-08-14 DIAGNOSIS — G20C Parkinsonism, unspecified: Secondary | ICD-10-CM | POA: Diagnosis not present

## 2022-08-14 DIAGNOSIS — I6502 Occlusion and stenosis of left vertebral artery: Secondary | ICD-10-CM | POA: Diagnosis not present

## 2022-08-14 DIAGNOSIS — Z79899 Other long term (current) drug therapy: Secondary | ICD-10-CM | POA: Diagnosis not present

## 2022-08-14 DIAGNOSIS — I6623 Occlusion and stenosis of bilateral posterior cerebral arteries: Secondary | ICD-10-CM | POA: Diagnosis not present

## 2022-08-14 DIAGNOSIS — R059 Cough, unspecified: Secondary | ICD-10-CM | POA: Diagnosis not present

## 2022-08-14 DIAGNOSIS — I10 Essential (primary) hypertension: Secondary | ICD-10-CM | POA: Diagnosis not present

## 2022-08-14 DIAGNOSIS — R109 Unspecified abdominal pain: Secondary | ICD-10-CM | POA: Diagnosis not present

## 2022-08-30 DIAGNOSIS — K5900 Constipation, unspecified: Secondary | ICD-10-CM | POA: Diagnosis not present

## 2022-08-30 DIAGNOSIS — I10 Essential (primary) hypertension: Secondary | ICD-10-CM | POA: Diagnosis not present

## 2022-08-30 DIAGNOSIS — G20A1 Parkinson's disease without dyskinesia, without mention of fluctuations: Secondary | ICD-10-CM | POA: Diagnosis not present

## 2022-08-30 DIAGNOSIS — I6381 Other cerebral infarction due to occlusion or stenosis of small artery: Secondary | ICD-10-CM | POA: Diagnosis not present

## 2022-09-06 DIAGNOSIS — G20A1 Parkinson's disease without dyskinesia, without mention of fluctuations: Secondary | ICD-10-CM | POA: Diagnosis not present

## 2022-09-06 DIAGNOSIS — G459 Transient cerebral ischemic attack, unspecified: Secondary | ICD-10-CM | POA: Diagnosis not present

## 2022-09-06 DIAGNOSIS — G903 Multi-system degeneration of the autonomic nervous system: Secondary | ICD-10-CM | POA: Diagnosis not present

## 2022-09-11 ENCOUNTER — Encounter (INDEPENDENT_AMBULATORY_CARE_PROVIDER_SITE_OTHER): Payer: Medicare Other | Admitting: Ophthalmology

## 2022-09-11 ENCOUNTER — Encounter (INDEPENDENT_AMBULATORY_CARE_PROVIDER_SITE_OTHER): Payer: Self-pay

## 2022-09-12 DIAGNOSIS — I10 Essential (primary) hypertension: Secondary | ICD-10-CM | POA: Diagnosis not present

## 2022-09-20 ENCOUNTER — Encounter (INDEPENDENT_AMBULATORY_CARE_PROVIDER_SITE_OTHER): Payer: Medicare Other | Admitting: Ophthalmology

## 2022-09-20 DIAGNOSIS — H353132 Nonexudative age-related macular degeneration, bilateral, intermediate dry stage: Secondary | ICD-10-CM

## 2022-09-20 DIAGNOSIS — D3132 Benign neoplasm of left choroid: Secondary | ICD-10-CM | POA: Diagnosis not present

## 2022-09-20 DIAGNOSIS — H348322 Tributary (branch) retinal vein occlusion, left eye, stable: Secondary | ICD-10-CM | POA: Diagnosis not present

## 2022-09-20 DIAGNOSIS — H43813 Vitreous degeneration, bilateral: Secondary | ICD-10-CM

## 2022-09-20 DIAGNOSIS — H35033 Hypertensive retinopathy, bilateral: Secondary | ICD-10-CM

## 2022-09-20 DIAGNOSIS — I10 Essential (primary) hypertension: Secondary | ICD-10-CM

## 2022-09-20 DIAGNOSIS — H33302 Unspecified retinal break, left eye: Secondary | ICD-10-CM

## 2022-09-27 DIAGNOSIS — H401131 Primary open-angle glaucoma, bilateral, mild stage: Secondary | ICD-10-CM | POA: Diagnosis not present

## 2022-09-27 DIAGNOSIS — Z961 Presence of intraocular lens: Secondary | ICD-10-CM | POA: Diagnosis not present

## 2022-09-27 DIAGNOSIS — H353131 Nonexudative age-related macular degeneration, bilateral, early dry stage: Secondary | ICD-10-CM | POA: Diagnosis not present

## 2022-09-27 DIAGNOSIS — H04123 Dry eye syndrome of bilateral lacrimal glands: Secondary | ICD-10-CM | POA: Diagnosis not present

## 2022-09-27 DIAGNOSIS — D3131 Benign neoplasm of right choroid: Secondary | ICD-10-CM | POA: Diagnosis not present

## 2022-09-27 DIAGNOSIS — Q141 Congenital malformation of retina: Secondary | ICD-10-CM | POA: Diagnosis not present

## 2022-10-04 DIAGNOSIS — R351 Nocturia: Secondary | ICD-10-CM | POA: Diagnosis not present

## 2022-10-04 DIAGNOSIS — N401 Enlarged prostate with lower urinary tract symptoms: Secondary | ICD-10-CM | POA: Diagnosis not present

## 2022-10-11 DIAGNOSIS — R413 Other amnesia: Secondary | ICD-10-CM | POA: Diagnosis not present

## 2022-10-11 DIAGNOSIS — G20B2 Parkinson's disease with dyskinesia, with fluctuations: Secondary | ICD-10-CM | POA: Diagnosis not present

## 2022-10-11 DIAGNOSIS — I6381 Other cerebral infarction due to occlusion or stenosis of small artery: Secondary | ICD-10-CM | POA: Diagnosis not present

## 2022-10-16 ENCOUNTER — Ambulatory Visit: Payer: Medicare Other | Attending: Internal Medicine | Admitting: Physical Therapy

## 2022-10-16 ENCOUNTER — Other Ambulatory Visit: Payer: Self-pay

## 2022-10-16 ENCOUNTER — Encounter: Payer: Self-pay | Admitting: Physical Therapy

## 2022-10-16 VITALS — BP 109/55

## 2022-10-16 DIAGNOSIS — R293 Abnormal posture: Secondary | ICD-10-CM | POA: Diagnosis not present

## 2022-10-16 DIAGNOSIS — R29818 Other symptoms and signs involving the nervous system: Secondary | ICD-10-CM | POA: Diagnosis not present

## 2022-10-16 DIAGNOSIS — R2681 Unsteadiness on feet: Secondary | ICD-10-CM | POA: Diagnosis not present

## 2022-10-16 DIAGNOSIS — R2689 Other abnormalities of gait and mobility: Secondary | ICD-10-CM | POA: Insufficient documentation

## 2022-10-16 DIAGNOSIS — M6281 Muscle weakness (generalized): Secondary | ICD-10-CM | POA: Diagnosis not present

## 2022-10-16 NOTE — Therapy (Unsigned)
OUTPATIENT PHYSICAL THERAPY NEURO EVALUATION   Patient Name: Seth Carter MRN: [id number] DOB:1941/07/14, 82 y.o., male Today's Date: 10/16/2022   PCP: Corliss Blacker, MD REFERRING PROVIDER: Corliss Blacker, MD  END OF SESSION:  PT End of Session - 10/16/22 1335     Visit Number 1    Number of Visits 17   16 + eval   Authorization Type MEDICARE PART A AND B    Progress Note Due on Visit 10    PT Start Time 1320    PT Stop Time [id number]    PT Time Calculation (min) 45 min    Equipment Utilized During Treatment Gait belt    Behavior During Therapy WFL for tasks assessed/performed             Past Medical History:  Diagnosis Date   Acute pharyngitis    AKI (acute kidney injury) 09/29/2020   Allergic rhinitis    Benign prostatic hyperplasia with lower urinary tract symptoms 09/23/2020   Bradykinesia    Cervical spondylosis without myelopathy 12/18/2019   Chronic back pain    Chronic sinusitis    Degeneration of lumbar intervertebral disc 08/20/2020   ED (erectile dysfunction)    Enlarged prostate 12/15/2020   Essential hypertension    nuclear test in epic 01-16-2020 normal perfusion no ischemia, normal lvf and wall motion, nuclear ef 65%   Family history of hemochromatosis    Fatigue    Gastroesophageal reflux disease without esophagitis 12/18/2019   Generalized weakness    Glaucoma, both eyes    Hardening of the aorta (main artery of the heart)    Hemorrhoids    History of syncope    cardiology evaluation by dr Marlou Porch, note in epic 12-26-2019, work-up includes event monitor 02-04-2020( showed SR with occ PAC & PVC rare PAT)/ echo  12-26-2019 (mild LVH, ef 50-55%, moderate MR, mild to moderate AV sclerosis no stenosis, ascending aorta 42mm) and nuclear test 01-16-2020 (normal perfusion w/ normal LV and wall motion, nuclear ef 65%)   Hyperglycemia 12/18/2019   Hypogonadism in male    Leukocytosis 09/29/2020   Macular degeneration, right eye    Mild neurocognitive  disorder due to Parkinson's disease 12/15/2020   Mixed hyperlipidemia    Onychomycosis 12/18/2019   Osteoarthritis of knee 12/18/2019   Pain in hand 12/18/2019   Parkinson's disease 08/2020   Renal stone 12/18/2019   Sebaceous cyst 12/18/2019   Seizure-like activity 09/29/2020   Spinal stenosis of lumbar region 08/20/2020   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   SHOULDER SURGERY Right 58's   TONSILLECTOMY  age 25   TRANSURETHRAL RESECTION OF PROSTATE N/A 09/23/2020   Procedure: TRANSURETHRAL RESECTION OF THE PROSTATE (TURP);  Surgeon: [name], MD;  Location: [facility];  Service: Urology;  Laterality: N/A;  74 MINS   Patient Active Problem List   Diagnosis Date Noted   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   Allergic rhinitis    Chronic sinusitis    Enlarged prostate    Hardening of the aorta (main artery of the  heart)    Vitamin B12 deficiency    Seizure-like activity 09/29/2020   Parkinson's disease 09/29/2020   Leukocytosis 09/29/2020   Benign prostatic hyperplasia with lower urinary tract symptoms 09/23/2020   Degeneration of lumbar intervertebral disc 08/20/2020   Spinal stenosis of lumbar region 08/20/2020   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/2019   Hemorrhoids 12/18/2019   Vitamin D deficiency 12/18/2019   Male erectile dysfunction 12/18/2019   Osteoarthritis of knee 12/18/2019   Impaired fasting glucose 12/18/2019   Gastroesophageal reflux disease without esophagitis 12/18/2019   Essential hypertension 12/18/2019   Mixed hyperlipidemia 12/18/2019    Fatigue 12/18/2019   Pain in hand 12/18/2019   Onychomycosis 12/18/2019   Cervical spondylosis without myelopathy 12/18/2019   Toxic effect of venom of wasps 12/18/2019   Right flank pain 12/18/2019   Family history of hemochromatosis 12/18/2019   Pure hypercholesterolemia 12/18/2019   Hyperglycemia 12/18/2019   Shoulder joint pain 01/29/2019    ONSET DATE: 10/11/2022  REFERRING DIAG: I63.81 (ICD-10-CM) - 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  Daughter states he has had an event in January when he has been lightheaded and dizzy since.  He has a blocked artery in the back right side of his head.  He has had a lot of increased medication changes.  He has regressed back to needing his rollator full-time.  He had an unwitnessed fall in [place] outside in the heat.  Daughter is afraid he had a PD freezing episode that might have caused this.  Has had PD diagnosis for about 3 years.  He would like to return to [employer] as he was doing prior to incident.  Pt accompanied by: family member-Daughter, Joni Fears  PERTINENT HISTORY: GERD, HTN, cervical myelopathy, Parkinson's Disease w/ mild neurocognitive disorder, lumbar spinal stenosis, macular degeneration with difficulty with depth perception  PAIN:  Are you having pain? No  PRECAUTIONS: Fall  WEIGHT BEARING RESTRICTIONS: No  FALLS: Has patient fallen in last 6 months? Yes.  Number of falls 2-fell in the yard last week when stepping in a hole  LIVING  ENVIRONMENT: Lives with: lives with their spouse-has one small dog Lives in: House/apartment Stairs: Yes: Internal: 14-16 steps; on left going up and External: 3 steps; on right going up Has following equipment at home: Single point cane, Walker - 2 wheeled, Environmental consultant - 4 wheeled, Wheelchair (manual), Shower bench, Grab bars, and chair lift and insertable bed rails  PLOF: Independent with community mobility with device, Independent with transfers, Requires assistive device for independence, Needs assistance with ADLs, and supervision for ADLs  PATIENT GOALS: "To get back to where I was before our incident in [place]."  OBJECTIVE:   DIAGNOSTIC FINDINGS: ***  COGNITION: Overall cognitive status: History of cognitive impairments - at baseline   SENSATION: WFL  COORDINATION: LE RAMS:  slow and deliberate Heel-to-shin: WFL bilaterally  EDEMA:  None noted in BLE  POSTURE: rounded shoulders and forward head  LOWER EXTREMITY ROM:     Active  Right Eval Left Eval  Hip flexion Grossly WFL  Hip extension   Hip abduction   Hip adduction   Hip internal rotation   Hip external rotation   Knee flexion   Knee extension   Ankle dorsiflexion   Ankle plantarflexion   Ankle inversion    Ankle eversion     (Blank rows = not tested)  LOWER EXTREMITY MMT:    MMT Right Eval Left Eval  Hip flexion 4+/5 4+/5  Hip extension    Hip abduction    Hip adduction    Hip internal rotation    Hip external rotation    Knee flexion    Knee extension 5/5 5/5  Ankle dorsiflexion 4+/5 4/5  Ankle plantarflexion    Ankle inversion    Ankle eversion    (Blank rows = not tested)  BED MOBILITY:  Sit to supine Modified independence Supine to sit Modified independence *uses bed rail intermittently TRANSFERS: Assistive device utilized: Environmental consultant - 4 wheeled  Sit to stand: SBA Stand to sit: Modified independence Chair  to chair: SBA Floor:  level of assist varies per daughter's report based on cognition  GAIT: Gait pattern: {gait characteristics:25376} Distance walked: *** Assistive device utilized: {Assistive devices:23999} Level of assistance: {Levels of assistance:24026} Comments: ***  FUNCTIONAL TESTS:  5 times sit to stand: 12.54 seconds w/ hands-on-knees and x1 instance of back of legs on chair  PATIENT SURVEYS:  {rehab surveys:24030}  TODAY'S TREATMENT:                                                                                                                              DATE: N/A    PATIENT EDUCATION: Education details: Discussed orthostatic compensation strategies for fall prevention. Person educated: {Person educated:25204} Education method: {Education Method:25205} Education comprehension: {Education Comprehension:25206}  HOME EXERCISE PROGRAM: ***  GOALS: Goals reviewed with patient? {yes/no:20286}  SHORT TERM GOALS: Target date: ***  *** Baseline: Goal status: {GOALSTATUS:25110}  2.  *** Baseline:  Goal status: {GOALSTATUS:25110}  3.  *** Baseline:  Goal  status: {GOALSTATUS:25110}  4.  *** Baseline:  Goal status: {GOALSTATUS:25110}  5.  *** Baseline:  Goal status: {GOALSTATUS:25110}  6.  *** Baseline:  Goal status: {GOALSTATUS:25110}  LONG TERM GOALS: Target date: ***  *** Baseline:  Goal status: {GOALSTATUS:25110}  2.  *** Baseline:  Goal status: {GOALSTATUS:25110}  3.  *** Baseline:  Goal status: {GOALSTATUS:25110}  4.  *** Baseline:  Goal status: {GOALSTATUS:25110}  5.  *** Baseline:  Goal status: {GOALSTATUS:25110}  6.  *** Baseline:  Goal status: {GOALSTATUS:25110}  ASSESSMENT:  CLINICAL IMPRESSION: Patient is a *** y.o. *** who was seen today for physical therapy evaluation and treatment for ***.   OBJECTIVE IMPAIRMENTS: {opptimpairments:25111}.   ACTIVITY LIMITATIONS: {activitylimitations:27494}  PARTICIPATION  LIMITATIONS: {participationrestrictions:25113}  PERSONAL FACTORS: {Personal factors:25162} are also affecting patient's functional outcome.   REHAB POTENTIAL: {rehabpotential:25112}  CLINICAL DECISION MAKING: {clinical decision making:25114}  EVALUATION COMPLEXITY: {Evaluation complexity:25115}  PLAN:  PT FREQUENCY: 2x/week  PT DURATION: 8 weeks  PLANNED INTERVENTIONS: {rehab planned interventions:25118::"Therapeutic exercises","Therapeutic activity","Neuromuscular re-education","Balance training","Gait training","Patient/Family education","Self Care","Joint mobilization"}  PLAN FOR NEXT SESSION: Bary Richard, PT, DPT 10/16/2022, 2:06 PM

## 2022-10-18 ENCOUNTER — Ambulatory Visit: Payer: Medicare Other | Admitting: Physical Therapy

## 2022-10-18 VITALS — BP 126/70 | HR 63

## 2022-10-18 DIAGNOSIS — R2681 Unsteadiness on feet: Secondary | ICD-10-CM

## 2022-10-18 DIAGNOSIS — R2689 Other abnormalities of gait and mobility: Secondary | ICD-10-CM | POA: Diagnosis not present

## 2022-10-18 DIAGNOSIS — R293 Abnormal posture: Secondary | ICD-10-CM | POA: Diagnosis not present

## 2022-10-18 DIAGNOSIS — M6281 Muscle weakness (generalized): Secondary | ICD-10-CM | POA: Diagnosis not present

## 2022-10-18 DIAGNOSIS — R29818 Other symptoms and signs involving the nervous system: Secondary | ICD-10-CM | POA: Diagnosis not present

## 2022-10-18 NOTE — Therapy (Signed)
OUTPATIENT PHYSICAL THERAPY NEURO TREATMENT   Patient Name: Seth Carter MRN: [id number] DOB:Jul 22, 1941, 82 y.o., male Today's Date: 10/18/2022   PCP: Corliss Blacker, MD REFERRING PROVIDER: Corliss Blacker, MD  END OF SESSION:  PT End of Session - 10/18/22 1236     Visit Number 2    Number of Visits 17   16 + eval   Date for PT Re-Evaluation 12/22/22   pushed out due to scheduling   Authorization Type MEDICARE PART A AND B    Progress Note Due on Visit 10    PT Start Time 1233    PT Stop Time 1324    PT Time Calculation (min) 51 min    Equipment Utilized During Treatment Gait belt    Activity Tolerance Patient tolerated treatment well;Treatment limited secondary to medical complications (Comment)   Elevated BP   Behavior During Therapy WFL for tasks assessed/performed              Past Medical History:  Diagnosis Date   Acute pharyngitis    AKI (acute kidney injury) 09/29/2020   Allergic rhinitis    Benign prostatic hyperplasia with lower urinary tract symptoms 09/23/2020   Bradykinesia    Cervical spondylosis without myelopathy 12/18/2019   Chronic back pain    Chronic sinusitis    Degeneration of lumbar intervertebral disc 08/20/2020   ED (erectile dysfunction)    Enlarged prostate 12/15/2020   Essential hypertension    nuclear test in epic 01-16-2020 normal perfusion no ischemia, normal lvf and wall motion, nuclear ef 65%   Family history of hemochromatosis    Fatigue    Gastroesophageal reflux disease without esophagitis 12/18/2019   Generalized weakness    Glaucoma, both eyes    Hardening of the aorta (main artery of the heart)    Hemorrhoids    History of syncope    cardiology evaluation by dr Marlou Porch, note in epic 12-26-2019, work-up includes event monitor 02-04-2020( showed SR with occ PAC & PVC rare PAT)/ echo  12-26-2019 (mild LVH, ef 50-55%, moderate MR, mild to moderate AV sclerosis no stenosis, ascending aorta 75mm) and nuclear test 01-16-2020  (normal perfusion w/ normal LV and wall motion, nuclear ef 65%)   Hyperglycemia 12/18/2019   Hypogonadism in male    Leukocytosis 09/29/2020   Macular degeneration, right eye    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   Mixed hyperlipidemia    Onychomycosis 12/18/2019   Osteoarthritis of knee 12/18/2019   Pain in hand 12/18/2019   Parkinson's disease 08/2020   Renal stone 12/18/2019   Sebaceous cyst 12/18/2019   Seizure-like activity 09/29/2020   Spinal stenosis of lumbar region 08/20/2020   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   SHOULDER SURGERY Right 59's   TONSILLECTOMY  age 53   TRANSURETHRAL RESECTION OF PROSTATE N/A 09/23/2020   Procedure: TRANSURETHRAL RESECTION OF THE PROSTATE (TURP);  Surgeon: [name], MD;  Location: [facility];  Service: Urology;  Laterality: N/A;  26 MINS   Patient Active Problem List   Diagnosis Date Noted  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   Allergic rhinitis    Chronic sinusitis    Enlarged prostate    Hardening of the aorta (main artery of the heart)    Vitamin B12 deficiency    Seizure-like activity 09/29/2020   Parkinson's disease 09/29/2020   Leukocytosis 09/29/2020   Benign prostatic hyperplasia with lower urinary tract symptoms 09/23/2020   Degeneration of lumbar intervertebral disc 08/20/2020   Spinal stenosis of lumbar region 08/20/2020   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/2019   Hemorrhoids 12/18/2019   Vitamin D deficiency 12/18/2019   Male erectile dysfunction 12/18/2019    Osteoarthritis of knee 12/18/2019   Impaired fasting glucose 12/18/2019   Gastroesophageal reflux disease without esophagitis 12/18/2019   Essential hypertension 12/18/2019   Mixed hyperlipidemia 12/18/2019   Fatigue 12/18/2019   Pain in hand 12/18/2019   Onychomycosis 12/18/2019   Cervical spondylosis without myelopathy 12/18/2019   Toxic effect of venom of wasps 12/18/2019   Right flank pain 12/18/2019   Family history of hemochromatosis 12/18/2019   Pure hypercholesterolemia 12/18/2019   Hyperglycemia 12/18/2019   Shoulder joint pain 01/29/2019    ONSET DATE: 10/11/2022  REFERRING DIAG: I63.81 (ICD-10-CM) - Other cerebral infarction due to occlusion or stenosis of small artery  THERAPY DIAG:  Unsteadiness on feet  Other symptoms and signs involving the nervous system  Abnormal posture  Rationale for Evaluation and Treatment: Rehabilitation  SUBJECTIVE:                                                                                                                                                                                             SUBJECTIVE STATEMENT: Pt reports doing well, denies acute changes. No falls. Is still riding his stationary bike at home  Pt accompanied by: family member-Daughter, Joni Fears  PERTINENT HISTORY: GERD, HTN, cervical myelopathy, Parkinson's Disease w/ mild neurocognitive disorder (diagnosed 2022), lumbar spinal stenosis, macular degeneration with difficulty with depth perception  From neurology note 09/06/2022: On 08/10/22, his daughter called to report a sudden clinical decline: Began requiring Rollator to walk short distances, had been going a mile or so by himself without Rollator. He is tired. His BP is high sitting and standing now. I advised he go to Urgent Care today to rule out any infections going on (& since they have traveled out of town and he experienced this sudden change.)  On 08/21/22, he messaged: "I had sudden onset  weakness, light headedness, feeling bad and BP fluctuating very high. Went to local ER and had CT scan which showed possible area of stroke. Transferred to [facility] and admitted. Had  MRI, MRA, Heart & carotid arteries ultrasound, along with EKG and extensive bloodwork. MRI didn't show a stroke & no definitive reason for flare up."   Pt is known to clinic from prior episode ending 12/23/2020.  PAIN:  Are you having pain? No  VITALS Vitals:   10/18/22 1245 10/18/22 1250  BP: (!) 177/76 126/70  Pulse: (!) 56 63     PRECAUTIONS: Fall and Other: orthostatic BP  WEIGHT BEARING RESTRICTIONS: No  FALLS: Has patient fallen in last 6 months? Yes. Number of falls 2-fell in the yard last week when stepping in a hole  LIVING ENVIRONMENT: Lives with: lives with their spouse-has one small dog Lives in: House/apartment Stairs: Yes: Internal: 14-16 steps; on left going up and External: 3 steps; on right going up Has following equipment at home: Single point cane, Environmental consultant - 2 wheeled, Environmental consultant - 4 wheeled, Wheelchair (manual), Shower bench, Grab bars, and chair lift and insertable bed rails  PLOF: Independent with community mobility with device, Independent with transfers, Requires assistive device for independence, Needs assistance with ADLs, and supervision for ADLs  PATIENT GOALS: "To get back to where I was before our incident in [place]."  OBJECTIVE:    DIAGNOSTIC FINDINGS:  DAT scan 03/15/2021 IMPRESSION: Alphia Moh are irregular-shaped with decreased radiotracer activity. Loss of normal activity activity is greater on the LEFT. Findings are suggestive of Parkinsonian syndrome pathology.  COGNITION: Overall cognitive status: History of cognitive impairments - at baseline   SENSATION: WFL  COORDINATION: LE RAMS:  slow and deliberate Heel-to-shin: WFL bilaterally  EDEMA:  None noted in BLE  POSTURE: rounded shoulders and forward head  LOWER EXTREMITY ROM:      Active  Right Eval Left Eval  Hip flexion Grossly WFL  Hip extension   Hip abduction   Hip adduction   Hip internal rotation   Hip external rotation   Knee flexion   Knee extension   Ankle dorsiflexion   Ankle plantarflexion   Ankle inversion    Ankle eversion     (Blank rows = not tested)  LOWER EXTREMITY MMT:    MMT Right Eval Left Eval  Hip flexion 4+/5 4+/5  Hip extension    Hip abduction    Hip adduction    Hip internal rotation    Hip external rotation    Knee flexion    Knee extension 5/5 5/5  Ankle dorsiflexion 4+/5 4/5  Ankle plantarflexion    Ankle inversion    Ankle eversion    (Blank rows = not tested)  BED MOBILITY:  Sit to supine Modified independence Supine to sit Modified independence *uses bed rail intermittently  TODAY'S TREATMENT:    Ther Act  Assessed vitals (see above) and pt's systolic elevated in seated position but sustained 50 point drop in standing, pt asymptomatic. Reviewed orthostatic compensation strategies provided from previous therapist, pt and daughter verbalized understanding.  Discussed importance of using AD at all times (especially when outside) and therapist's preference for pt to use rollator in case he becomes orthostatic and needs to sit down.  Noted R inattention and R lateral gait deviations throughout session. Briefly educated pt and daughter on deficits noted by therapist and compensation strategies to use during activities, but due to time constraints, unable to provide thorough education.   Ther Ex  SciFit multi-peaks level 4 for 8 minutes using BUE/BLEs for neural priming for reciprocal movement, dynamic cardiovascular warmup and increased amplitude of stepping. Pt unable to locate R handle and required min  A to locate it. RPE of 1-2/10 and BP of 144/63 mmHg following activity.    GAIT: Gait pattern: step through pattern, decreased stride length, decreased trunk rotation, and narrow BOS Distance walked: various  clinic distances Assistive device utilized: Walker - 4 wheeled and None Level of assistance: SBA and CGA Comments: Pt demonstrates shuffling and festination without use of AD. Pt frequently "parking" AD away from him prior to turning to sit on mat table, requiring cues for safe AD management. Noted R lateral gait deviations and minor R inattention    PATIENT EDUCATION: Education details: Reiterated orthostatic compensation strategies for fall prevention, potential for R inattention/visual field cut  Person educated: Patient and Child(ren) Education method: Explanation Education comprehension: verbalized understanding and needs further education  HOME EXERCISE PROGRAM: From previous bout of therapy: [id number]  Standing PWR moves  GOALS: Goals reviewed with patient? Yes  SHORT TERM GOALS: Target date: 11/17/2022  Pt will be independent with initial strength and balance HEP to improve quality of movement. Baseline:  To be established. Goal status: INITIAL  2.  Pt will decrease 5xSTS to </=12 seconds w/o UE support or LE touching chair in order to demonstrate decreased risk for falls and improved functional bilateral LE strength and power. Baseline: 12.54 seconds w/ hands-on-knees and x1 instance of back of legs on chair Goal status: INITIAL  3.  6MWT to be assessed w/ goal set as appropriate. Baseline: To be assessed. Goal status: INITIAL  4.  MiniBEST to be assessed w/ goal set as appropriate. Baseline: To be assessed. Goal status: INITIAL  5.  Fall prevention and freezing management strategies to be reviewed with patient to promote improved safety with functional mobility. Baseline: To be provided. Goal status: INITIAL  LONG TERM GOALS: Target date: 12/15/2022  Patient to be independent with finalized strength and balance HEP to include PWR! If appropriate for patient level in order to promote improved functional mobility. Baseline:  To be established. Goal status:  INITIAL  2.  6MWT to be assessed w/ goal set as appropriate. Baseline: To be assessed. Goal status: INITIAL  3.  miniBest to be assessed w/ goal set as appropriate. Baseline:  To be assessed. Goal status: INITIAL  4.  Patient will require no more than modA for floor recovery in order to promote safe recovery from falls with caregiver at home. Baseline: Pt needs review due to fluctuation w/ cognition changes. Goal status: INITIAL  ASSESSMENT:  CLINICAL IMPRESSION: Emphasis of skilled PT session on pt and family education and endurance. Pt's systolic BP more elevated in seated position today but pt asymptomatic [id number] point systolic BP drop in standing. Noted pt veering to R side w/gait and unable to locate objects in R peripheral field, suspect R visual field cut or inattention. Encouraged pt to wear compression socks to next session to assist w/orthostatics, pt's daughter verbalized understanding. Continue POC.   OBJECTIVE IMPAIRMENTS: Abnormal gait, decreased activity tolerance, decreased balance, decreased cognition, decreased coordination, decreased endurance, difficulty walking, improper body mechanics, postural dysfunction, and orthostatic BP .   ACTIVITY LIMITATIONS: carrying, lifting, bending, squatting, stairs, and locomotion level  PARTICIPATION LIMITATIONS: meal prep, cleaning, laundry, driving, and community activity  PERSONAL FACTORS: Age, Past/current experiences, Time since onset of injury/illness/exacerbation, Transportation, and 1-2 comorbidities: cervical myelopathy and macular degeneration  are also affecting patient's functional outcome.   REHAB POTENTIAL: Good  CLINICAL DECISION MAKING: Evolving/moderate complexity  EVALUATION COMPLEXITY: Moderate  PLAN:  PT FREQUENCY: 2x/week  PT DURATION: 8 weeks  PLANNED  INTERVENTIONS: Therapeutic exercises, Therapeutic activity, Neuromuscular re-education, Balance training, Gait training, Patient/Family education, Self  Care, Stair training, Vestibular training, DME instructions, and Re-evaluation  PLAN FOR NEXT SESSION: Pt has standing orthostatic BP!  Assess miniBEST, 6MWT and set STG/LTGs, initiate HEP for dynamic balance and review PWR from prior episode. Assess R inattention, gait training w/ vs w/o rollator (pt and daughter's goal to get away from AD)   Cruzita Lederer Alfred Eckley, PT, DPT 10/18/2022, 2:58 PM

## 2022-10-24 ENCOUNTER — Ambulatory Visit: Payer: Medicare Other | Attending: Internal Medicine | Admitting: Physical Therapy

## 2022-10-24 ENCOUNTER — Ambulatory Visit: Payer: Medicare Other | Admitting: Physical Therapy

## 2022-10-24 ENCOUNTER — Encounter: Payer: Self-pay | Admitting: Physical Therapy

## 2022-10-24 VITALS — BP 119/63 | HR 61

## 2022-10-24 DIAGNOSIS — M6281 Muscle weakness (generalized): Secondary | ICD-10-CM | POA: Insufficient documentation

## 2022-10-24 DIAGNOSIS — I10 Essential (primary) hypertension: Secondary | ICD-10-CM | POA: Diagnosis not present

## 2022-10-24 DIAGNOSIS — R29818 Other symptoms and signs involving the nervous system: Secondary | ICD-10-CM | POA: Diagnosis not present

## 2022-10-24 DIAGNOSIS — R293 Abnormal posture: Secondary | ICD-10-CM | POA: Insufficient documentation

## 2022-10-24 DIAGNOSIS — R2681 Unsteadiness on feet: Secondary | ICD-10-CM

## 2022-10-24 DIAGNOSIS — R2689 Other abnormalities of gait and mobility: Secondary | ICD-10-CM | POA: Insufficient documentation

## 2022-10-24 DIAGNOSIS — I6381 Other cerebral infarction due to occlusion or stenosis of small artery: Secondary | ICD-10-CM | POA: Diagnosis not present

## 2022-10-24 DIAGNOSIS — G20B2 Parkinson's disease with dyskinesia, with fluctuations: Secondary | ICD-10-CM | POA: Diagnosis not present

## 2022-10-24 NOTE — Therapy (Signed)
OUTPATIENT PHYSICAL THERAPY NEURO TREATMENT   Patient Name: DEKLAN ROZELL MRN: [id number] DOB:[birth year], 82 y.o., male Today's Date: 10/24/2022   PCP: Corliss Blacker, MD REFERRING PROVIDER: Corliss Blacker, MD  END OF SESSION:  PT End of Session - 10/24/22 0944     Visit Number 3    Number of Visits 17   16 + eval   Date for PT Re-Evaluation 12/22/22   pushed out due to scheduling   Authorization Type MEDICARE PART A AND B    Progress Note Due on Visit 10    PT Start Time [phone]   PT addressing patient problem prior   PT Stop Time 1020    PT Time Calculation (min) 41 min    Equipment Utilized During Treatment Gait belt    Activity Tolerance Patient tolerated treatment well    Behavior During Therapy WFL for tasks assessed/performed              Past Medical History:  Diagnosis Date   Acute pharyngitis    AKI (acute kidney injury) 09/29/2020   Allergic rhinitis    Benign prostatic hyperplasia with lower urinary tract symptoms 09/23/2020   Bradykinesia    Cervical spondylosis without myelopathy 12/18/2019   Chronic back pain    Chronic sinusitis    Degeneration of lumbar intervertebral disc 08/20/2020   ED (erectile dysfunction)    Enlarged prostate 12/15/2020   Essential hypertension    nuclear test in epic 01-16-2020 normal perfusion no ischemia, normal lvf and wall motion, nuclear ef 65%   Family history of hemochromatosis    Fatigue    Gastroesophageal reflux disease without esophagitis 12/18/2019   Generalized weakness    Glaucoma, both eyes    Hardening of the aorta (main artery of the heart)    Hemorrhoids    History of syncope    cardiology evaluation by dr Marlou Porch, note in epic 12-26-2019, work-up includes event monitor 02-04-2020( showed SR with occ PAC & PVC rare PAT)/ echo  12-26-2019 (mild LVH, ef 50-55%, moderate MR, mild to moderate AV sclerosis no stenosis, ascending aorta 49mm) and nuclear test 01-16-2020 (normal perfusion w/ normal LV and wall  motion, nuclear ef 65%)   Hyperglycemia 12/18/2019   Hypogonadism in male    Leukocytosis 09/29/2020   Macular degeneration, right eye    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   Mixed hyperlipidemia    Onychomycosis 12/18/2019   Osteoarthritis of knee 12/18/2019   Pain in hand 12/18/2019   Parkinson's disease 08/2020   Renal stone 12/18/2019   Sebaceous cyst 12/18/2019   Seizure-like activity 09/29/2020   Spinal stenosis of lumbar region 08/20/2020   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/2019   Toxic effect of venom of wasps 12/18/2019   Vitamin B12 deficiency    Vitamin D deficiency    Past Surgical History:  Procedure Laterality Date   ANKLE SURGERY Right 10-04-2015  @[facility]    debridement achilles tendon & reattachment and heel excision osteophyte   CATARACT EXTRACTION W/ INTRAOCULAR LENS  IMPLANT, BILATERAL  yrs ago   [place]  2019 approx.   SHOULDER SURGERY Right 79's   TONSILLECTOMY  age 52   TRANSURETHRAL RESECTION OF PROSTATE N/A 09/23/2020   Procedure: TRANSURETHRAL RESECTION OF THE PROSTATE (TURP);  Surgeon: [name], MD;  Location: [facility];  Service: Urology;  Laterality: N/A;  50 MINS   Patient Active Problem List   Diagnosis Date Noted   Mild neurocognitive disorder  due to Parkinson's disease 12/15/2020   Allergic rhinitis    Chronic sinusitis    Enlarged prostate    Hardening of the aorta (main artery of the heart)    Vitamin B12 deficiency    Seizure-like activity 09/29/2020   Parkinson's disease 09/29/2020   Leukocytosis 09/29/2020   Benign prostatic hyperplasia with lower urinary tract symptoms 09/23/2020   Degeneration of lumbar intervertebral disc 08/20/2020   Spinal stenosis of lumbar region 08/20/2020   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/2019   Hemorrhoids 12/18/2019   Vitamin D deficiency 12/18/2019   Male erectile dysfunction 12/18/2019   Osteoarthritis of knee 12/18/2019   Impaired  fasting glucose 12/18/2019   Gastroesophageal reflux disease without esophagitis 12/18/2019   Essential hypertension 12/18/2019   Mixed hyperlipidemia 12/18/2019   Fatigue 12/18/2019   Pain in hand 12/18/2019   Onychomycosis 12/18/2019   Cervical spondylosis without myelopathy 12/18/2019   Toxic effect of venom of wasps 12/18/2019   Right flank pain 12/18/2019   Family history of hemochromatosis 12/18/2019   Pure hypercholesterolemia 12/18/2019   Hyperglycemia 12/18/2019   Shoulder joint pain 01/29/2019    ONSET DATE: 10/11/2022  REFERRING DIAG: I63.81 (ICD-10-CM) - Other cerebral infarction due to occlusion or stenosis of small artery  THERAPY DIAG:  Unsteadiness on feet  Other symptoms and signs involving the nervous system  Abnormal posture  Muscle weakness (generalized)  Other abnormalities of gait and mobility  Rationale for Evaluation and Treatment: Rehabilitation  SUBJECTIVE:                                                                                                                                                                                             SUBJECTIVE STATEMENT: Pt states he was dizzy this morning up until the time he got into the car to come to appt.  He denies falls since visit prior.  He has been using the rollator everywhere he goes the last couple of days.  Pt accompanied by: family member-Daughter, Joni Fears  PERTINENT HISTORY: GERD, HTN, cervical myelopathy, Parkinson's Disease w/ mild neurocognitive disorder (diagnosed 2022), lumbar spinal stenosis, macular degeneration with difficulty with depth perception  From neurology note 09/06/2022: On 08/10/22, his daughter called to report a sudden clinical decline: Began requiring Rollator to walk short distances, had been going a mile or so by himself without Rollator. He is tired. His BP is high sitting and standing now. I advised he go to Urgent Care today to rule out any infections going on  (& since they have traveled out of town and he experienced this sudden change.)  On 08/21/22, he messaged: "I had sudden onset  weakness, light headedness, feeling bad and BP fluctuating very high. Went to local ER and had CT scan which showed possible area of stroke. Transferred to [facility] and admitted. Had MRI, MRA, Heart & carotid arteries ultrasound, along with EKG and extensive bloodwork. MRI didn't show a stroke & no definitive reason for flare up."   Pt is known to clinic from prior episode ending 12/23/2020.  PAIN:  Are you having pain? No  VITALS Vitals:   10/24/22 0941 10/24/22 0943  BP: 126/73 119/63  Pulse: (!) 58 61     PRECAUTIONS: Fall and Other: orthostatic BP  WEIGHT BEARING RESTRICTIONS: No  FALLS: Has patient fallen in last 6 months? Yes. Number of falls 2-fell in the yard last week when stepping in a hole  LIVING ENVIRONMENT: Lives with: lives with their spouse-has one small dog Lives in: House/apartment Stairs: Yes: Internal: 14-16 steps; on left going up and External: 3 steps; on right going up Has following equipment at home: Single point cane, Environmental consultant - 2 wheeled, Environmental consultant - 4 wheeled, Wheelchair (manual), Shower bench, Grab bars, and chair lift and insertable bed rails  PLOF: Independent with community mobility with device, Independent with transfers, Requires assistive device for independence, Needs assistance with ADLs, and supervision for ADLs  PATIENT GOALS: "To get back to where I was before our incident in [place]."  OBJECTIVE:    DIAGNOSTIC FINDINGS:  DAT scan 03/15/2021 IMPRESSION: Alphia Moh are irregular-shaped with decreased radiotracer activity. Loss of normal activity activity is greater on the LEFT. Findings are suggestive of Parkinsonian syndrome pathology.  COGNITION: Overall cognitive status: History of cognitive impairments - at baseline   SENSATION: WFL  COORDINATION: LE RAMS:  slow and deliberate Heel-to-shin:  WFL bilaterally  EDEMA:  None noted in BLE  POSTURE: rounded shoulders and forward head  LOWER EXTREMITY ROM:     Active  Right Eval Left Eval  Hip flexion Grossly WFL  Hip extension   Hip abduction   Hip adduction   Hip internal rotation   Hip external rotation   Knee flexion   Knee extension   Ankle dorsiflexion   Ankle plantarflexion   Ankle inversion    Ankle eversion     (Blank rows = not tested)  LOWER EXTREMITY MMT:    MMT Right Eval Left Eval  Hip flexion 4+/5 4+/5  Hip extension    Hip abduction    Hip adduction    Hip internal rotation    Hip external rotation    Knee flexion    Knee extension 5/5 5/5  Ankle dorsiflexion 4+/5 4/5  Ankle plantarflexion    Ankle inversion    Ankle eversion    (Blank rows = not tested)  BED MOBILITY:  Sit to supine Modified independence Supine to sit Modified independence *uses bed rail intermittently  TODAY'S TREATMENT:    Ther Act  Assessed vitals (see above) and pt's BP not notably orthostatic today, but experiencing mild drop in standing, asymptomatic at time of assessment. Pt is still not wearing compression stockings or abdominal binder.  Did encourage slow transitions when getting up from bed especially to go to bathroom at night and when first waking in the morning.  6MWT w/ rollator:  1,019 ft, no deviation from looped path noted, but patient has continuous deceleration noted with increased distance.  Education provided to patient and daughter that right inattention may not be as noticeable day-to-day due to compensations, varying activity demands, and how the patient is feeling  which will all impact task performance.  Also discussed it is possible it appeared less noticeable this session as pt had varying visual external cues to use during assessment that could have masked deficit. miniBEST:  [facility] PT Assessment - 10/24/22 1001       Balance   Balance Assessed Yes      Standardized Balance Assessment    Standardized Balance Assessment Mini-BESTest      Mini-BESTest   Sit To Stand Normal: Comes to stand without use of hands and stabilizes independently.    Rise to Toes Moderate: Heels up, but not full range (smaller than when holding hands), OR noticeable instability for 3 s.    Stand on one leg (left) Moderate: < 20 s    Stand on one leg (right) Moderate: < 20 s    Stand on one leg - lowest score 1    Compensatory Stepping Correction - Forward Moderate: More than one step is required to recover equilibrium    Compensatory Stepping Correction - Backward Moderate: More than one step is required to recover equilibrium    Compensatory Stepping Correction - Left Lateral Moderate: Several steps to recover equilibrium    Compensatory Stepping Correction - Right Lateral Normal: Recovers independently with 1 step (crossover or lateral OK)   large lateral step   Stepping Corredtion Lateral - lowest score 1    Stance - Feet together, eyes open, firm surface  Normal: 30s    Stance - Feet together, eyes closed, foam surface  Moderate: < 30s    Incline - Eyes Closed Moderate: Stands independently < 30s OR aligns with surface    Change in Gait Speed Moderate: Unable to change walking speed or signs of imbalance   significant change in speed, but pt unsteady   Walk with head turns - Horizontal Moderate: performs head turns with reduction in gait speed.    Walk with pivot turns Moderate:Turns with feet close SLOW (>4 steps) with good balance.    Step over obstacles Normal: Able to step over box with minimal change of gait speed and with good balance.    Timed UP & GO with Dual Task Severe: Stops counting while walking OR stops walking while counting.   Normal TUG 20.25 sec no AD; cognitive TUG- pt unable to complete counting and walking simultaneously.   Mini-BEST total score 16            PATIENT EDUCATION: Education details: Assessment outcomes and interpretation.  Will review and modify HEP at next  session. Person educated: Patient and Child(ren) Education method: Explanation Education comprehension: verbalized understanding and needs further education  HOME EXERCISE PROGRAM: From previous bout of therapy: [id number]  Standing PWR moves  GOALS: Goals reviewed with patient? Yes  SHORT TERM GOALS: Target date: 11/17/2022  Pt will be independent with initial strength and balance HEP to improve quality of movement. Baseline:  To be established. Goal status: INITIAL  2.  Pt will decrease 5xSTS to </=12 seconds w/o UE support or LE touching chair in order to demonstrate decreased risk for falls and improved functional bilateral LE strength and power. Baseline: 12.54 seconds w/ hands-on-knees and x1 instance of back of legs on chair Goal status: INITIAL  3.  6MWT to be assessed w/ goal set as appropriate. Baseline: LTG set only due to initial performance. Goal status: REVISED-D/C' (4/2)  4.  Pt will demonstrate increased miniBEST score of >/=20/28 for improved dynamic balance and postural responses to decrease risk of falls. Baseline:  16/28 (4/2) Goal status: INITIAL  5.  Fall prevention and freezing management strategies to be reviewed with patient to promote improved safety with functional mobility. Baseline: To be provided. Goal status: INITIAL  LONG TERM GOALS: Target date: 12/15/2022  Patient to be independent with finalized strength and balance HEP to include PWR! If appropriate for patient level in order to promote improved functional mobility. Baseline:  To be established. Goal status: INITIAL  2.  Pt will ambulate >/=1200 feet on 6MWT to demonstrate improved functional endurance for home and community participation. Baseline: 1019 ft w/ rollator Goal status: INITIAL  3.  Pt will demonstrate increased miniBEST score of >/=24/28 for improved dynamic balance and postural responses to decrease risk of falls. Baseline:  16/28 (4/2) Goal status: INITIAL  4.  Patient will  require no more than modA for floor recovery in order to promote safe recovery from falls with caregiver at home. Baseline: Pt needs review due to fluctuation w/ cognition changes. Goal status: INITIAL  ASSESSMENT:  CLINICAL IMPRESSION: Time spent assessing 6MWT and miniBEST today.  Patient appears to have some improvement in blood pressure response to position change this session with only 7 mmHg drop in systolic vs 50 mmHg noted in session prior.  It was difficult to note any right inattention this session during assessments, but patient did not demonstrate any marked right deviations.  PT to continue assessing this in future session to better address.  He ambulates 1019 ft w/ rollator during 6MWT without adverse symptoms.  He scores 16/28 on the miniBEST this session indicating an increased risk for falls w/ PT recommendation to continue use of the rollator until these deficits can be more thoroughly addressed.  He continues to benefit from skilled PT to promote return to independence as able.  OBJECTIVE IMPAIRMENTS: Abnormal gait, decreased activity tolerance, decreased balance, decreased cognition, decreased coordination, decreased endurance, difficulty walking, improper body mechanics, postural dysfunction, and orthostatic BP .   ACTIVITY LIMITATIONS: carrying, lifting, bending, squatting, stairs, and locomotion level  PARTICIPATION LIMITATIONS: meal prep, cleaning, laundry, driving, and community activity  PERSONAL FACTORS: Age, Past/current experiences, Time since onset of injury/illness/exacerbation, Transportation, and 1-2 comorbidities: cervical myelopathy and macular degeneration  are also affecting patient's functional outcome.   REHAB POTENTIAL: Good  CLINICAL DECISION MAKING: Evolving/moderate complexity  EVALUATION COMPLEXITY: Moderate  PLAN:  PT FREQUENCY: 2x/week  PT DURATION: 8 weeks  PLANNED INTERVENTIONS: Therapeutic exercises, Therapeutic activity, Neuromuscular  re-education, Balance training, Gait training, Patient/Family education, Self Care, Stair training, Vestibular training, DME instructions, and Re-evaluation  PLAN FOR NEXT SESSION: Pt has standing orthostatic BP!  Modify HEP prn for dynamic balance and review PWR from prior episode. Assess R inattention, gait training w/ vs w/o rollator (pt and daughter's goal to get away from AD), floor recovery   Bary Richard, PT, DPT 10/24/2022, 5:07 PM

## 2022-10-27 ENCOUNTER — Encounter: Payer: Self-pay | Admitting: Physical Therapy

## 2022-10-27 ENCOUNTER — Ambulatory Visit: Payer: Medicare Other | Admitting: Physical Therapy

## 2022-10-27 VITALS — BP 117/61 | HR 62

## 2022-10-27 DIAGNOSIS — R29818 Other symptoms and signs involving the nervous system: Secondary | ICD-10-CM | POA: Diagnosis not present

## 2022-10-27 DIAGNOSIS — M6281 Muscle weakness (generalized): Secondary | ICD-10-CM | POA: Diagnosis not present

## 2022-10-27 DIAGNOSIS — R2681 Unsteadiness on feet: Secondary | ICD-10-CM

## 2022-10-27 DIAGNOSIS — R293 Abnormal posture: Secondary | ICD-10-CM | POA: Diagnosis not present

## 2022-10-27 DIAGNOSIS — R2689 Other abnormalities of gait and mobility: Secondary | ICD-10-CM | POA: Diagnosis not present

## 2022-10-27 NOTE — Therapy (Signed)
OUTPATIENT PHYSICAL THERAPY NEURO TREATMENT   Patient Name: Seth Carter MRN: [id number] DOB:1941/03/18, 82 y.o., male Today's Date: 10/27/2022   PCP: [name], MD REFERRING PROVIDER: [name], MD  END OF SESSION:  PT End of Session - 10/27/22 1020     Visit Number 4    Number of Visits 17   16 + eval   Date for PT Re-Evaluation 12/22/22   pushed out due to scheduling   Authorization Type MEDICARE PART A AND B    Progress Note Due on Visit 10    PT Start Time 1018    PT Stop Time 1058    PT Time Calculation (min) 40 min    Equipment Utilized During Treatment Gait belt    Activity Tolerance Patient tolerated treatment well    Behavior During Therapy WFL for tasks assessed/performed              Past Medical History:  Diagnosis Date   Acute pharyngitis    AKI (acute kidney injury) 09/29/2020   Allergic rhinitis    Benign prostatic hyperplasia with lower urinary tract symptoms 09/23/2020   Bradykinesia    Cervical spondylosis without myelopathy 12/18/2019   Chronic back pain    Chronic sinusitis    Degeneration of lumbar intervertebral disc 08/20/2020   ED (erectile dysfunction)    Enlarged prostate 12/15/2020   Essential hypertension    nuclear test in epic 01-16-2020 normal perfusion no ischemia, normal lvf and wall motion, nuclear ef 65%   Family history of hemochromatosis    Fatigue    Gastroesophageal reflux disease without esophagitis 12/18/2019   Generalized weakness    Glaucoma, both eyes    Hardening of the aorta (main artery of the heart)    Hemorrhoids    History of syncope    cardiology evaluation by [name], note in epic 12-26-2019, work-up includes event monitor 02-04-2020( showed SR with occ PAC & PVC rare PAT)/ echo  12-26-2019 (mild LVH, ef 50-55%, moderate MR, mild to moderate AV sclerosis no stenosis, ascending aorta 39mm) and nuclear test 01-16-2020 (normal perfusion w/ normal LV and wall motion, nuclear ef 65%)   Hyperglycemia  12/18/2019   Hypogonadism in male    Leukocytosis 09/29/2020   Macular degeneration, right eye    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   Mixed hyperlipidemia    Onychomycosis 12/18/2019   Osteoarthritis of knee 12/18/2019   Pain in hand 12/18/2019   Parkinson's disease 08/2020   Renal stone 12/18/2019   Sebaceous cyst 12/18/2019   Seizure-like activity 09/29/2020   Spinal stenosis of lumbar region 08/20/2020   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   SHOULDER SURGERY Right 1990's   TONSILLECTOMY  age 59   TRANSURETHRAL RESECTION OF PROSTATE N/A 09/23/2020   Procedure: TRANSURETHRAL RESECTION OF THE PROSTATE (TURP);  Surgeon: [name], MD;  Location: [facility];  Service: Urology;  Laterality: N/A;  21 MINS   Patient Active Problem List   Diagnosis Date Noted   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  Allergic rhinitis    Chronic sinusitis    Enlarged prostate    Hardening of the aorta (main artery of the heart)    Vitamin B12 deficiency    Seizure-like activity 09/29/2020   Parkinson's disease 09/29/2020   Leukocytosis 09/29/2020   Benign prostatic hyperplasia with lower urinary tract symptoms 09/23/2020   Degeneration of lumbar intervertebral disc 08/20/2020   Spinal stenosis of lumbar region 08/20/2020   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/2019   Hemorrhoids 12/18/2019   Vitamin D deficiency 12/18/2019   Male erectile dysfunction 12/18/2019   Osteoarthritis of knee 12/18/2019   Impaired fasting glucose 12/18/2019   Gastroesophageal  reflux disease without esophagitis 12/18/2019   Essential hypertension 12/18/2019   Mixed hyperlipidemia 12/18/2019   Fatigue 12/18/2019   Pain in hand 12/18/2019   Onychomycosis 12/18/2019   Cervical spondylosis without myelopathy 12/18/2019   Toxic effect of venom of wasps 12/18/2019   Right flank pain 12/18/2019   Family history of hemochromatosis 12/18/2019   Pure hypercholesterolemia 12/18/2019   Hyperglycemia 12/18/2019   Shoulder joint pain 01/29/2019    ONSET DATE: 10/11/2022  REFERRING DIAG: I63.81 (ICD-10-CM) - Other cerebral infarction due to occlusion or stenosis of small artery  THERAPY DIAG:  Unsteadiness on feet  Other symptoms and signs involving the nervous system  Muscle weakness (generalized)  Other abnormalities of gait and mobility  Rationale for Evaluation and Treatment: Rehabilitation  SUBJECTIVE:                                                                                                                                                                                             SUBJECTIVE STATEMENT: Reports his dizziness started after his fall in January. Describes it as more as a ligtheadedness. No falls. Reports that his family is looking for compression stockings and an abdominal binder   Pt accompanied by: family member-Daughter, Seth Carter  PERTINENT HISTORY: GERD, HTN, cervical myelopathy, Parkinson's Disease w/ mild neurocognitive disorder (diagnosed 2022), lumbar spinal stenosis, macular degeneration with difficulty with depth perception  From neurology note 09/06/2022: On 08/10/22, his daughter called to report a sudden clinical decline: Began requiring Rollator to walk short distances, had been going a mile or so by himself without Rollator. He is tired. His BP is high sitting and standing now. I advised he go to Urgent Care today to rule out any infections going on (& since they have traveled out of town and he experienced this sudden  change.)  On 08/21/22, he messaged: "I had sudden onset weakness, light headedness, feeling bad and BP fluctuating very high. Went to local ER and had CT scan which showed  possible area of stroke. Transferred to [facility] and admitted. Had MRI, MRA, Heart & carotid arteries ultrasound, along with EKG and extensive bloodwork. MRI didn't show a stroke & no definitive reason for flare up."   Pt is known to clinic from prior episode ending 12/23/2020.  PAIN:  Are you having pain? No  VITALS Vitals:   10/27/22 1027 10/27/22 1029  BP: (!) 155/72 117/61  Pulse: (!) 57 62      PRECAUTIONS: Fall and Other: orthostatic BP  WEIGHT BEARING RESTRICTIONS: No  FALLS: Has patient fallen in last 6 months? Yes. Number of falls 2-fell in the yard last week when stepping in a hole  LIVING ENVIRONMENT: Lives with: lives with their spouse-has one small dog Lives in: House/apartment Stairs: Yes: Internal: 14-16 steps; on left going up and External: 3 steps; on right going up Has following equipment at home: Single point cane, Environmental consultant - 2 wheeled, Environmental consultant - 4 wheeled, Wheelchair (manual), Shower bench, Grab bars, and chair lift and insertable bed rails  PLOF: Independent with community mobility with device, Independent with transfers, Requires assistive device for independence, Needs assistance with ADLs, and supervision for ADLs  PATIENT GOALS: "To get back to where I was before our incident in [place]."  OBJECTIVE:    DIAGNOSTIC FINDINGS:  DAT scan 03/15/2021 IMPRESSION: Tomma Rakers are irregular-shaped with decreased radiotracer activity. Loss of normal activity activity is greater on the LEFT. Findings are suggestive of Parkinsonian syndrome pathology.  COGNITION: Overall cognitive status: History of cognitive impairments - at baseline   SENSATION: WFL  COORDINATION: LE RAMS:  slow and deliberate Heel-to-shin: WFL bilaterally  EDEMA:  None noted in BLE  POSTURE: rounded  shoulders and forward head  LOWER EXTREMITY ROM:     Active  Right Eval Left Eval  Hip flexion Grossly WFL  Hip extension   Hip abduction   Hip adduction   Hip internal rotation   Hip external rotation   Knee flexion   Knee extension   Ankle dorsiflexion   Ankle plantarflexion   Ankle inversion    Ankle eversion     (Blank rows = not tested)  LOWER EXTREMITY MMT:    MMT Right Eval Left Eval  Hip flexion 4+/5 4+/5  Hip extension    Hip abduction    Hip adduction    Hip internal rotation    Hip external rotation    Knee flexion    Knee extension 5/5 5/5  Ankle dorsiflexion 4+/5 4/5  Ankle plantarflexion    Ankle inversion    Ankle eversion    (Blank rows = not tested)  BED MOBILITY:  Sit to supine Modified independence Supine to sit Modified independence *uses bed rail intermittently  TODAY'S TREATMENT:    Ther Act  Assessed vitals (see above) and pt BP values orthostatic, but pt asymptomatic throughout session. Pt reports his family is working on getting him compression stockings and an abdominal binder. Continued to encourage slow transitions when getting up from bed especially to go to bathroom at night and when first waking in the morning.  Pt needing cues to turn fully all the way with rollator before sitting down to mat or chair as pt tends to leave it behind and then take a couple of steps.   NMR:  When ambulating with rollator during session, cued for tall posture and taking big steps.  Performed 10 reps of sit <> stands with no UE support, focus on scooting out to the edge, and  BIG forward lean. Pt with no episodes of retropulsion   Initiated HEP (just started fresh instead of going over pt's previous HEP)  Pt performs PWR! Moves in seated position x 10 reps   PWR! Up for improved posture - cues for scap retraction  PWR! Rock for improved weighshifting - cues to look up at hands and to kick legs out   PWR! Twist for improved trunk rotation - cues  for upright posture in the middle   PWR! Step for improved step initiation - double step out and in, cues for incr foot clearance   Cues provided for larger amplitude movements, pt reporting his effort level as a 5/10    Access Code: [email or web address] URL: [email or web address] Date: 10/27/2022 Prepared by: Sherlie Banhloe Kaianna Dolezal  Performed below exercises at countertop:   Exercises - Side to Side Weight Shift with Overhead Reach and Counter Support  - 1-2 x daily - 5 x weekly - 2 sets - 10 reps - Step Sideways with Arms Reaching  - 1-2 x daily - 5 x weekly - 2 sets - 10 reps - cued for incr foot clearance, esp with LLE, pt has difficulty taking a single step back with L leg back to midline   PATIENT EDUCATION: Education details: HEP for seated PWR moves and standing balance at [employer]  Person educated: Patient and Child(ren) Education method: Programmer, multimediaxplanation, Demonstration, Verbal cues, and Handouts Education comprehension: verbalized understanding and needs further education  HOME EXERCISE PROGRAM: Seated PWR moves   Access Code: [email or web address] URL: [email or web address] Date: 10/27/2022 Prepared by: Sherlie Banhloe Zariyah Stephens  Exercises - Side to Side Weight Shift with Overhead Reach and Counter Support  - 1-2 x daily - 5 x weekly - 2 sets - 10 reps - Step Sideways with Arms Reaching  - 1-2 x daily - 5 x weekly - 2 sets - 10 reps  GOALS: Goals reviewed with patient? Yes  SHORT TERM GOALS: Target date: 11/17/2022  Pt will be independent with initial strength and balance HEP to improve quality of movement. Baseline:  To be established. Goal status: INITIAL  2.  Pt will decrease 5xSTS to </=12 seconds w/o UE support or LE touching chair in order to demonstrate decreased risk for falls and improved functional bilateral LE strength and power. Baseline: 12.54 seconds w/ hands-on-knees and x1 instance of back of legs on chair Goal status: INITIAL  3.  6MWT to be assessed w/ goal  set as appropriate. Baseline: LTG set only due to initial performance. Goal status: REVISED-D/C' (4/2)  4.  Pt will demonstrate increased miniBEST score of >/=20/28 for improved dynamic balance and postural responses to decrease risk of falls. Baseline: 16/28 (4/2) Goal status: INITIAL  5.  Fall prevention and freezing management strategies to be reviewed with patient to promote improved safety with functional mobility. Baseline: To be provided. Goal status: INITIAL  LONG TERM GOALS: Target date: 12/15/2022  Patient to be independent with finalized strength and balance HEP to include PWR! If appropriate for patient level in order to promote improved functional mobility. Baseline:  To be established. Goal status: INITIAL  2.  Pt will ambulate >/=1200 feet on 6MWT to demonstrate improved functional endurance for home and community participation. Baseline: 1019 ft w/ rollator Goal status: INITIAL  3.  Pt will demonstrate increased miniBEST score of >/=24/28 for improved dynamic balance and postural responses to decrease risk of falls. Baseline:  16/28 (4/2) Goal status: INITIAL  4.  Patient will require no more than modA for floor recovery  in order to promote safe recovery from falls with caregiver at home. Baseline: Pt needs review due to fluctuation w/ cognition changes. Goal status: INITIAL  ASSESSMENT:  CLINICAL IMPRESSION: Pt with orthostatic BP values today, but pt asymptomatic in standing (see above). Today's skilled session focused on initiating HEP with seated PWR moves for larger amplitude movements and standing balance at the countertop with UE support as needed. Pt more challenged with larger amplitude stepping tasks with LLE. Pt didn't report any dizziness with standing tasks during session. Will continue per POC.     OBJECTIVE IMPAIRMENTS: Abnormal gait, decreased activity tolerance, decreased balance, decreased cognition, decreased coordination, decreased endurance,  difficulty walking, improper body mechanics, postural dysfunction, and orthostatic BP .   ACTIVITY LIMITATIONS: carrying, lifting, bending, squatting, stairs, and locomotion level  PARTICIPATION LIMITATIONS: meal prep, cleaning, laundry, driving, and community activity  PERSONAL FACTORS: Age, Past/current experiences, Time since onset of injury/illness/exacerbation, Transportation, and 1-2 comorbidities: cervical myelopathy and macular degeneration  are also affecting patient's functional outcome.   REHAB POTENTIAL: Good  CLINICAL DECISION MAKING: Evolving/moderate complexity  EVALUATION COMPLEXITY: Moderate  PLAN:  PT FREQUENCY: 2x/week  PT DURATION: 8 weeks  PLANNED INTERVENTIONS: Therapeutic exercises, Therapeutic activity, Neuromuscular re-education, Balance training, Gait training, Patient/Family education, Self Care, Stair training, Vestibular training, DME instructions, and Re-evaluation  PLAN FOR NEXT SESSION: Pt has standing orthostatic BP!  Add to HEP for balance. Assess R inattention, gait training w/ vs w/o rollator (pt and daughter's goal to get away from AD), floor recovery   Drake Leachhloe N Read Bonelli, PT, DPT 10/27/2022, 12:16 PM

## 2022-10-30 DIAGNOSIS — I672 Cerebral atherosclerosis: Secondary | ICD-10-CM | POA: Diagnosis not present

## 2022-10-31 ENCOUNTER — Ambulatory Visit: Payer: Medicare Other | Admitting: Physical Therapy

## 2022-10-31 ENCOUNTER — Encounter: Payer: Self-pay | Admitting: Physical Therapy

## 2022-10-31 VITALS — BP 121/58

## 2022-10-31 DIAGNOSIS — R2681 Unsteadiness on feet: Secondary | ICD-10-CM

## 2022-10-31 DIAGNOSIS — R29818 Other symptoms and signs involving the nervous system: Secondary | ICD-10-CM | POA: Diagnosis not present

## 2022-10-31 DIAGNOSIS — R293 Abnormal posture: Secondary | ICD-10-CM

## 2022-10-31 DIAGNOSIS — R2689 Other abnormalities of gait and mobility: Secondary | ICD-10-CM | POA: Diagnosis not present

## 2022-10-31 DIAGNOSIS — M6281 Muscle weakness (generalized): Secondary | ICD-10-CM

## 2022-10-31 NOTE — Therapy (Signed)
OUTPATIENT PHYSICAL THERAPY NEURO TREATMENT   Patient Name: Seth Carter MRN: [id number] DOB:20-Jan-1941, 82 y.o., male Today's Date: 10/31/2022   PCP: [name], MD REFERRING PROVIDER: [name], MD  END OF SESSION:  PT End of Session - 10/31/22 1025     Visit Number 5    Number of Visits 17   16 + eval   Date for PT Re-Evaluation 12/22/22   pushed out due to scheduling   Authorization Type MEDICARE PART A AND B    Progress Note Due on Visit 10    PT Start Time 1018    PT Stop Time 1101    PT Time Calculation (min) 43 min    Equipment Utilized During Treatment Gait belt    Activity Tolerance Patient tolerated treatment well    Behavior During Therapy WFL for tasks assessed/performed              Past Medical History:  Diagnosis Date   Acute pharyngitis    AKI (acute kidney injury) 09/29/2020   Allergic rhinitis    Benign prostatic hyperplasia with lower urinary tract symptoms 09/23/2020   Bradykinesia    Cervical spondylosis without myelopathy 12/18/2019   Chronic back pain    Chronic sinusitis    Degeneration of lumbar intervertebral disc 08/20/2020   ED (erectile dysfunction)    Enlarged prostate 12/15/2020   Essential hypertension    nuclear test in epic 01-16-2020 normal perfusion no ischemia, normal lvf and wall motion, nuclear ef 65%   Family history of hemochromatosis    Fatigue    Gastroesophageal reflux disease without esophagitis 12/18/2019   Generalized weakness    Glaucoma, both eyes    Hardening of the aorta (main artery of the heart)    Hemorrhoids    History of syncope    cardiology evaluation by [name], note in epic 12-26-2019, work-up includes event monitor 02-04-2020( showed SR with occ PAC & PVC rare PAT)/ echo  12-26-2019 (mild LVH, ef 50-55%, moderate MR, mild to moderate AV sclerosis no stenosis, ascending aorta 39mm) and nuclear test 01-16-2020 (normal perfusion w/ normal LV and wall motion, nuclear ef 65%)   Hyperglycemia  12/18/2019   Hypogonadism in male    Leukocytosis 09/29/2020   Macular degeneration, right eye    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   Mixed hyperlipidemia    Onychomycosis 12/18/2019   Osteoarthritis of knee 12/18/2019   Pain in hand 12/18/2019   Parkinson's disease 08/2020   Renal stone 12/18/2019   Sebaceous cyst 12/18/2019   Seizure-like activity 09/29/2020   Spinal stenosis of lumbar region 08/20/2020   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   SHOULDER SURGERY Right 1990's   TONSILLECTOMY  age 43   TRANSURETHRAL RESECTION OF PROSTATE N/A 09/23/2020   Procedure: TRANSURETHRAL RESECTION OF THE PROSTATE (TURP);  Surgeon: [name], MD;  Location: [facility];  Service: Urology;  Laterality: N/A;  42 MINS   Patient Active Problem List   Diagnosis Date Noted   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  Allergic rhinitis    Chronic sinusitis    Enlarged prostate    Hardening of the aorta (main artery of the heart)    Vitamin B12 deficiency    Seizure-like activity 09/29/2020   Parkinson's disease 09/29/2020   Leukocytosis 09/29/2020   Benign prostatic hyperplasia with lower urinary tract symptoms 09/23/2020   Degeneration of lumbar intervertebral disc 08/20/2020   Spinal stenosis of lumbar region 08/20/2020   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/2019   Hemorrhoids 12/18/2019   Vitamin D deficiency 12/18/2019   Male erectile dysfunction 12/18/2019   Osteoarthritis of knee 12/18/2019   Impaired fasting glucose 12/18/2019   Gastroesophageal  reflux disease without esophagitis 12/18/2019   Essential hypertension 12/18/2019   Mixed hyperlipidemia 12/18/2019   Fatigue 12/18/2019   Pain in hand 12/18/2019   Onychomycosis 12/18/2019   Cervical spondylosis without myelopathy 12/18/2019   Toxic effect of venom of wasps 12/18/2019   Right flank pain 12/18/2019   Family history of hemochromatosis 12/18/2019   Pure hypercholesterolemia 12/18/2019   Hyperglycemia 12/18/2019   Shoulder joint pain 01/29/2019    ONSET DATE: 10/11/2022  REFERRING DIAG: I63.81 (ICD-10-CM) - Other cerebral infarction due to occlusion or stenosis of small artery  THERAPY DIAG:  Unsteadiness on feet  Other symptoms and signs involving the nervous system  Muscle weakness (generalized)  Other abnormalities of gait and mobility  Abnormal posture  Rationale for Evaluation and Treatment: Rehabilitation  SUBJECTIVE:                                                                                                                                                                                             SUBJECTIVE STATEMENT: He states they are still waiting on delivery of his compression stockings and abdominal binder.  He states it has been a hectic time at home as he and his wife have Alzheimer's and his sister-in-law is 95 and they feel she will pass soon.  He has not done his HEP recently citing these as his reason, but further states he does occasionally ride his stationary bike.  He denies recent falls or near falls or excessive pain today.  Pt accompanied by: family member-Daughter, [name] in lobby w/ wife  PERTINENT HISTORY: GERD, HTN, cervical myelopathy, Parkinson's Disease w/ mild neurocognitive disorder (diagnosed 2022), lumbar spinal stenosis, macular degeneration with difficulty with depth perception  From neurology note 09/06/2022: On 08/10/22, his daughter called to report a sudden clinical decline: Began requiring Rollator to walk short  distances, had been going a mile or so by himself without Rollator. He is tired. His BP is high sitting and standing now. I advised he  go to Urgent Care today to rule out any infections going on (& since they have traveled out of town and he experienced this sudden change.)  On 08/21/22, he messaged: "I had sudden onset weakness, light headedness, feeling bad and BP fluctuating very high. Went to local ER and had CT scan which showed possible area of stroke. Transferred to [facility] and admitted. Had MRI, MRA, Heart & carotid arteries ultrasound, along with EKG and extensive bloodwork. MRI didn't show a stroke & no definitive reason for flare up."   Pt is known to clinic from prior episode ending 12/23/2020.  PAIN:  Are you having pain? No  VITALS (LUE in sitting then standing prior to session onset): Vitals:   10/31/22 1020 10/31/22 1023  BP: (!) 147/68 (!) 121/58   PRECAUTIONS: Fall and Other: orthostatic BP  WEIGHT BEARING RESTRICTIONS: No  FALLS: Has patient fallen in last 6 months? Yes. Number of falls 2-fell in the yard last week when stepping in a hole  LIVING ENVIRONMENT: Lives with: lives with their spouse-has one small dog Lives in: House/apartment Stairs: Yes: Internal: 14-16 steps; on left going up and External: 3 steps; on right going up Has following equipment at home: Single point cane, Environmental consultant - 2 wheeled, Environmental consultant - 4 wheeled, Wheelchair (manual), Shower bench, Grab bars, and chair lift and insertable bed rails  PLOF: Independent with community mobility with device, Independent with transfers, Requires assistive device for independence, Needs assistance with ADLs, and supervision for ADLs  PATIENT GOALS: "To get back to where I was before our incident in [place]."  OBJECTIVE:    DIAGNOSTIC FINDINGS:  DAT scan 03/15/2021 IMPRESSION: Tomma Rakers are irregular-shaped with decreased radiotracer activity. Loss of normal activity activity is greater on the  LEFT. Findings are suggestive of Parkinsonian syndrome pathology.  COGNITION: Overall cognitive status: History of cognitive impairments - at baseline   SENSATION: WFL  COORDINATION: LE RAMS:  slow and deliberate Heel-to-shin: WFL bilaterally  EDEMA:  None noted in BLE  POSTURE: rounded shoulders and forward head  LOWER EXTREMITY ROM:     Active  Right Eval Left Eval  Hip flexion Grossly WFL  Hip extension   Hip abduction   Hip adduction   Hip internal rotation   Hip external rotation   Knee flexion   Knee extension   Ankle dorsiflexion   Ankle plantarflexion   Ankle inversion    Ankle eversion     (Blank rows = not tested)  LOWER EXTREMITY MMT:    MMT Right Eval Left Eval  Hip flexion 4+/5 4+/5  Hip extension    Hip abduction    Hip adduction    Hip internal rotation    Hip external rotation    Knee flexion    Knee extension 5/5 5/5  Ankle dorsiflexion 4+/5 4/5  Ankle plantarflexion    Ankle inversion    Ankle eversion    (Blank rows = not tested)  BED MOBILITY:  Sit to supine Modified independence Supine to sit Modified independence *uses bed rail intermittently  TODAY'S TREATMENT:    -STS w/ toes on incline 2x8 progressing to no UE support CGA-SBA due to intermittent posterior LOB, stepping strategy noted -STS progressed to max therapist provided posterior resistance using orange theracord, no LOB  GAIT: Gait pattern: decreased arm swing- Right, decreased arm swing- Left, decreased step length- Right, decreased step length- Left, decreased stride length, decreased trunk rotation, trunk flexed, and narrow BOS Distance walked: 210' + 234' +  234' Assistive device utilized: None Level of assistance: SBA and CGA Comments: Cues for improved step size and arm swing w/ demo, diminished return.  2 laps around by front office using trekking poles for controlled arm swing > pt controlled arm swing without poles but with cues for "big arms, big steps" > no  cues, but instructed for relaxed shoulders and natural arm swing w/ moderate carryover noted.  Pt does well maintaining larger steps and normal BOS following progression.  Arm swing does diminish during turns to right.         -Forward weaving through cones no AD CGA, repeated freezing, had patient pause and restart with edu on freezing strategies, pt states he was unaware he was stopping his steps -Forward and backwards weaving through cones no AD CGA, no freezing noted, but need for max cues for sequencing to effectively complete task -Repeated forward weaving through cones with rollator w/ no noted freezing, but need for cues to improve safe approximation to rollator w/ some improvement as task progressed  PATIENT EDUCATION: Education details:  Continue HEP-PWR! And new additions session prior.  Discussed freezing strategies, but patient would benefit from handout in future. Person educated: Patient and Child(ren) Education method: Explanation, Demonstration, Verbal cues, and Handouts Education comprehension: verbalized understanding and needs further education  HOME EXERCISE PROGRAM: Seated PWR moves   Access Code: [email or web address] URL: [email or web address] Date: 10/27/2022 Prepared by: Sherlie Banhloe Gilgannon  Exercises - Side to Side Weight Shift with Overhead Reach and Counter Support  - 1-2 x daily - 5 x weekly - 2 sets - 10 reps - Step Sideways with Arms Reaching  - 1-2 x daily - 5 x weekly - 2 sets - 10 reps  GOALS: Goals reviewed with patient? Yes  SHORT TERM GOALS: Target date: 11/17/2022  Pt will be independent with initial strength and balance HEP to improve quality of movement. Baseline:  To be established. Goal status: INITIAL  2.  Pt will decrease 5xSTS to </=12 seconds w/o UE support or LE touching chair in order to demonstrate decreased risk for falls and improved functional bilateral LE strength and power. Baseline: 12.54 seconds w/ hands-on-knees and x1 instance  of back of legs on chair Goal status: INITIAL  3.  6MWT to be assessed w/ goal set as appropriate. Baseline: LTG set only due to initial performance. Goal status: REVISED-D/C' (4/2)  4.  Pt will demonstrate increased miniBEST score of >/=20/28 for improved dynamic balance and postural responses to decrease risk of falls. Baseline: 16/28 (4/2) Goal status: INITIAL  5.  Fall prevention and freezing management strategies to be reviewed with patient to promote improved safety with functional mobility. Baseline: To be provided. Goal status: INITIAL  LONG TERM GOALS: Target date: 12/15/2022  Patient to be independent with finalized strength and balance HEP to include PWR! If appropriate for patient level in order to promote improved functional mobility. Baseline:  To be established. Goal status: INITIAL  2.  Pt will ambulate >/=1200 feet on 6MWT to demonstrate improved functional endurance for home and community participation. Baseline: 1019 ft w/ rollator Goal status: INITIAL  3.  Pt will demonstrate increased miniBEST score of >/=24/28 for improved dynamic balance and postural responses to decrease risk of falls. Baseline:  16/28 (4/2) Goal status: INITIAL  4.  Patient will require no more than modA for floor recovery in order to promote safe recovery from falls with caregiver at home. Baseline: Pt needs review due to fluctuation w/ cognition changes. Goal status:  INITIAL  ASSESSMENT:  CLINICAL IMPRESSION: Pt continues to be somewhat orthostatic in transition to standing, but is asymptomatic reporting this is very different from yesterday.  His family is in the process of obtaining compression garments to assist in management of this.  He tolerates all interventions well this session with focus on gait training tasks w/ and w/o rollator to challenge freezing strategies and general safety with ambulatory challenges.  He would do well with further freezing assessment and management.  His  anterior weight shifting and decreased reliance on his UE for standing appear improved today.  PT to continue managing confounding deficits related to PD and CVA as outlined in ongoing PT POC.    OBJECTIVE IMPAIRMENTS: Abnormal gait, decreased activity tolerance, decreased balance, decreased cognition, decreased coordination, decreased endurance, difficulty walking, improper body mechanics, postural dysfunction, and orthostatic BP .   ACTIVITY LIMITATIONS: carrying, lifting, bending, squatting, stairs, and locomotion level  PARTICIPATION LIMITATIONS: meal prep, cleaning, laundry, driving, and community activity  PERSONAL FACTORS: Age, Past/current experiences, Time since onset of injury/illness/exacerbation, Transportation, and 1-2 comorbidities: cervical myelopathy and macular degeneration  are also affecting patient's functional outcome.   REHAB POTENTIAL: Good  CLINICAL DECISION MAKING: Evolving/moderate complexity  EVALUATION COMPLEXITY: Moderate  PLAN:  PT FREQUENCY: 2x/week  PT DURATION: 8 weeks  PLANNED INTERVENTIONS: Therapeutic exercises, Therapeutic activity, Neuromuscular re-education, Balance training, Gait training, Patient/Family education, Self Care, Stair training, Vestibular training, DME instructions, and Re-evaluation  PLAN FOR NEXT SESSION: Pt has standing orthostatic BP!  Add to HEP for balance. Assess R inattention, gait training w/ vs w/o rollator (pt and daughter's goal to get away from AD), floor recovery, tossing cloth for arm swing, step to dot > bean bag lateral tosses/rotational tosses, blaze pods, hurdles/adv-retreat, handout for freezing strategies   Sadie Haber, PT, DPT 10/31/2022, 5:40 PM

## 2022-11-03 ENCOUNTER — Encounter: Payer: Self-pay | Admitting: Physical Therapy

## 2022-11-03 ENCOUNTER — Ambulatory Visit: Payer: Medicare Other | Admitting: Physical Therapy

## 2022-11-03 VITALS — BP 101/65 | HR 56

## 2022-11-03 DIAGNOSIS — R293 Abnormal posture: Secondary | ICD-10-CM

## 2022-11-03 DIAGNOSIS — R2681 Unsteadiness on feet: Secondary | ICD-10-CM | POA: Diagnosis not present

## 2022-11-03 DIAGNOSIS — R2689 Other abnormalities of gait and mobility: Secondary | ICD-10-CM | POA: Diagnosis not present

## 2022-11-03 DIAGNOSIS — R29818 Other symptoms and signs involving the nervous system: Secondary | ICD-10-CM

## 2022-11-03 DIAGNOSIS — M6281 Muscle weakness (generalized): Secondary | ICD-10-CM | POA: Diagnosis not present

## 2022-11-03 NOTE — Therapy (Signed)
OUTPATIENT PHYSICAL THERAPY NEURO TREATMENT   Patient Name: Seth Carter MRN: [id number] DOB:[birth year], 82 y.o., male Today's Date: 11/03/2022   PCP: Seth Martyr, MD REFERRING PROVIDER: [name], MD  END OF SESSION:  PT End of Session - 11/03/22 1113     Visit Number 6    Number of Visits 17   16 + eval   Date for PT Re-Evaluation 12/22/22   pushed out due to scheduling   Authorization Type MEDICARE PART A AND B    Progress Note Due on Visit 10    PT Start Time 1104    PT Stop Time 1146    PT Time Calculation (min) 42 min    Equipment Utilized During Treatment Gait belt    Activity Tolerance Patient tolerated treatment well    Behavior During Therapy WFL for tasks assessed/performed              Past Medical History:  Diagnosis Date   Acute pharyngitis    AKI (acute kidney injury) 09/29/2020   Allergic rhinitis    Benign prostatic hyperplasia with lower urinary tract symptoms 09/23/2020   Bradykinesia    Cervical spondylosis without myelopathy 12/18/2019   Chronic back pain    Chronic sinusitis    Degeneration of lumbar intervertebral disc 08/20/2020   ED (erectile dysfunction)    Enlarged prostate 12/15/2020   Essential hypertension    nuclear test in epic 01-16-2020 normal perfusion no ischemia, normal lvf and wall motion, nuclear ef 65%   Family history of hemochromatosis    Fatigue    Gastroesophageal reflux disease without esophagitis 12/18/2019   Generalized weakness    Glaucoma, both eyes    Hardening of the aorta (main artery of the heart)    Hemorrhoids    History of syncope    cardiology evaluation by dr Seth Carter, note in epic 12-26-2019, work-up includes event monitor 02-04-2020( showed SR with occ PAC & PVC rare PAT)/ echo  12-26-2019 (mild LVH, ef 50-55%, moderate MR, mild to moderate AV sclerosis no stenosis, ascending aorta 39mm) and nuclear test 01-16-2020 (normal perfusion w/ normal LV and wall motion, nuclear ef 65%)    Hyperglycemia 12/18/2019   Hypogonadism in male    Leukocytosis 09/29/2020   Macular degeneration, right eye    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   Mixed hyperlipidemia    Onychomycosis 12/18/2019   Osteoarthritis of knee 12/18/2019   Pain in hand 12/18/2019   Parkinson's disease 08/2020   Renal stone 12/18/2019   Sebaceous cyst 12/18/2019   Seizure-like activity 09/29/2020   Spinal stenosis of lumbar region 08/20/2020   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/2019   Toxic effect of venom of wasps 12/18/2019   Vitamin B12 deficiency    Vitamin D deficiency    Past Surgical History:  Procedure Laterality Date   ANKLE SURGERY Right 10-04-2015     debridement achilles tendon & reattachment and heel excision osteophyte   CATARACT EXTRACTION W/ INTRAOCULAR LENS  IMPLANT, BILATERAL  yrs ago   CYSTOSCOPY WITH INSERTION OF UROLIFT  2019 approx.   SHOULDER SURGERY Right 1990's   TONSILLECTOMY  age 69   TRANSURETHRAL RESECTION OF PROSTATE N/A 09/23/2020   Procedure: TRANSURETHRAL RESECTION OF THE PROSTATE (TURP);  Surgeon: Seth Matar, MD;  Location[facility];  Service: Urology;  Laterality: N/A;  25 MINS   Patient Active Problem List   Diagnosis Date Noted   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  Allergic rhinitis    Chronic sinusitis    Enlarged prostate    Hardening of the aorta (main artery of the heart)    Vitamin B12 deficiency    Seizure-like activity 09/29/2020   Parkinson's disease 09/29/2020   Leukocytosis 09/29/2020   Benign prostatic hyperplasia with lower urinary tract symptoms 09/23/2020   Degeneration of lumbar intervertebral disc 08/20/2020   Spinal stenosis of lumbar region 08/20/2020   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/2019   Hemorrhoids 12/18/2019   Vitamin D deficiency 12/18/2019   Male erectile dysfunction 12/18/2019   Osteoarthritis of knee 12/18/2019   Impaired fasting glucose 12/18/2019    Gastroesophageal reflux disease without esophagitis 12/18/2019   Essential hypertension 12/18/2019   Mixed hyperlipidemia 12/18/2019   Fatigue 12/18/2019   Pain in hand 12/18/2019   Onychomycosis 12/18/2019   Cervical spondylosis without myelopathy 12/18/2019   Toxic effect of venom of wasps 12/18/2019   Right flank pain 12/18/2019   Family history of hemochromatosis 12/18/2019   Pure hypercholesterolemia 12/18/2019   Hyperglycemia 12/18/2019   Shoulder joint pain 01/29/2019    ONSET DATE: 10/11/2022  REFERRING DIAG: I63.81 (ICD-10-CM) - Other cerebral infarction due to occlusion or stenosis of small artery  THERAPY DIAG:  Unsteadiness on feet  Other symptoms and signs involving the nervous system  Muscle weakness (generalized)  Other abnormalities of gait and mobility  Abnormal posture  Rationale for Evaluation and Treatment: Rehabilitation  SUBJECTIVE:                                                                                                                                                                                             SUBJECTIVE STATEMENT: Pt initiates ambulation into gym holding onto left rollator handle w/ RUE, PT prompts correction w/ pt adjusting appropriately.  He denies falls or acute changes.  He had a short dizzy spell this morning when reaching to pick something off the floor.  Pt accompanied by: family member-Daughter, Seth Carter in lobby w/ wife  PERTINENT HISTORY: GERD, HTN, cervical myelopathy, Parkinson's Disease w/ mild neurocognitive disorder (diagnosed 2022), lumbar spinal stenosis, macular degeneration with difficulty with depth perception  From neurology note 09/06/2022: On 08/10/22, his daughter called to report a sudden clinical decline: Began requiring Rollator to walk short distances, had been going a mile or so by himself without Rollator. He is tired. His BP is high sitting and standing now. I advised he go to Urgent Care today to  rule out any infections going on (& since they have traveled out of town and he experienced this sudden change.)  On 08/21/22, he messaged: "I had sudden onset weakness, light  headedness, feeling bad and BP fluctuating very high. Went to local ER and had CT scan which showed possible area of stroke. Transferred to [facility] and admitted. Had MRI, MRA, Heart & carotid arteries ultrasound, along with EKG and extensive bloodwork. MRI didn't show a stroke & no definitive reason for flare up."   Pt is known to clinic from prior episode ending 12/23/2020.  PAIN:  Are you having pain? No  VITALS (LUE in sitting then standing prior to session onset): Vitals:   11/03/22 1107 11/03/22 1110  BP: (!) 140/70 101/65  Pulse: (!) 53 (!) 56   PRECAUTIONS: Fall and Other: orthostatic BP  WEIGHT BEARING RESTRICTIONS: No  FALLS: Has patient fallen in last 6 months? Yes. Number of falls 2-fell in the yard last week when stepping in a hole  LIVING ENVIRONMENT: Lives with: lives with their spouse-has one small dog Lives in: House/apartment Stairs: Yes: Internal: 14-16 steps; on left going up and External: 3 steps; on right going up Has following equipment at home: Single point cane, Environmental consultant - 2 wheeled, Environmental consultant - 4 wheeled, Wheelchair (manual), Shower bench, Grab bars, and chair lift and insertable bed rails  PLOF: Independent with community mobility with device, Independent with transfers, Requires assistive device for independence, Needs assistance with ADLs, and supervision for ADLs  PATIENT GOALS: "To get back to where I was before our incident in [place]."  OBJECTIVE:    DIAGNOSTIC FINDINGS:  DAT scan 03/15/2021 IMPRESSION: Tomma Rakers are irregular-shaped with decreased radiotracer activity. Loss of normal activity activity is greater on the LEFT. Findings are suggestive of Parkinsonian syndrome pathology.  COGNITION: Overall cognitive status: History of cognitive impairments - at  baseline   SENSATION: WFL  COORDINATION: LE RAMS:  slow and deliberate Heel-to-shin: WFL bilaterally  EDEMA:  None noted in BLE  POSTURE: rounded shoulders and forward head  LOWER EXTREMITY ROM:     Active  Right Eval Left Eval  Hip flexion Grossly WFL  Hip extension   Hip abduction   Hip adduction   Hip internal rotation   Hip external rotation   Knee flexion   Knee extension   Ankle dorsiflexion   Ankle plantarflexion   Ankle inversion    Ankle eversion     (Blank rows = not tested)  LOWER EXTREMITY MMT:    MMT Right Eval Left Eval  Hip flexion 4+/5 4+/5  Hip extension    Hip abduction    Hip adduction    Hip internal rotation    Hip external rotation    Knee flexion    Knee extension 5/5 5/5  Ankle dorsiflexion 4+/5 4/5  Ankle plantarflexion    Ankle inversion    Ankle eversion    (Blank rows = not tested)  BED MOBILITY:  Sit to supine Modified independence Supine to sit Modified independence *uses bed rail intermittently  TODAY'S TREATMENT:    Tips to reduce freezing episodes with standing or walking:   Stand tall with your feet wide, so that you can rock and weight shift through your hips. Bruno't try to fight the freeze: if you begin taking slower, faster, smaller steps, STOP, get your posture tall, and RESET your posture and balance.  Take a deep breath before taking the BIG step to start again. March in place, with high knee stepping, to get started walking again. Use auditory cues:  Count out loud, think of a familiar tune or song or cadence, use pocket metronome, to use rhythm to get  started walking again. Use visual cues:  Use a line to step over, use laser pointer line to step over, (using BIG steps) to start walking again. Use visual targets to keep your posture tall (look ahead and focus on an object or target at eye level). As you approach where your destination with walking, count your steps out loud and/or focus on your target with your  eyes until you are fully there. Use appropriate assistive device, as advised by your physical therapist to assist with taking longer, consistent steps. *Reviewed and printed for patient on 4/12!  -4" hurdles forward facing progressing to no UE support, pt catches hurdles x5 w/ variable direction of LOB using bars to steady himself, discussed approximation to obstacle and counting step overs to promote big and smooth steps; he is mildly dizzy following task so returned to sitting x2 minutes with recovery per report -[employer] search, pt misses cones on right at floor level during initial lap requiring 230' total to collect all cones w/ moderate cues to improve range of environmental scanning -Step to dot for large amplitude LE priming > step to dot alternating LE w/ ipsilateral arm tossing to contralateral target CGA progressed to SBA  -Forward step w/ contralateral scarf toss for improved amplitude of movement and arm swing with pt having increased coordination difficulty requiring increased cuing  PATIENT EDUCATION: Education details:  Continue HEP.  Freezing strategies w/ printout.   Person educated: Patient and Child(ren) Education method: Explanation, Demonstration, Verbal cues, and Handouts Education comprehension: verbalized understanding and needs further education  HOME EXERCISE PROGRAM: Seated PWR moves   Access Code: [email or web address] URL: [email or web address] Date: 10/27/2022 Prepared by: Sherlie Ban  Exercises - Side to Side Weight Shift with Overhead Reach and Counter Support  - 1-2 x daily - 5 x weekly - 2 sets - 10 reps - Step Sideways with Arms Reaching  - 1-2 x daily - 5 x weekly - 2 sets - 10 reps  GOALS: Goals reviewed with patient? Yes  SHORT TERM GOALS: Target date: 11/17/2022  Pt will be independent with initial strength and balance HEP to improve quality of movement. Baseline:  To be established. Goal status: INITIAL  2.  Pt will decrease 5xSTS to </=12  seconds w/o UE support or LE touching chair in order to demonstrate decreased risk for falls and improved functional bilateral LE strength and power. Baseline: 12.54 seconds w/ hands-on-knees and x1 instance of back of legs on chair Goal status: INITIAL  3.  to be assessed w/ goal set as appropriate. Baseline: LTG set only due to initial performance. Goal status: REVISED-D/C' (4/2)  4.  Pt will demonstrate increased miniBEST score of >/=20/28 for improved dynamic balance and postural responses to decrease risk of falls. Baseline: 16/28 (4/2) Goal status: INITIAL  5.  Fall prevention and freezing management strategies to be reviewed with patient to promote improved safety with functional mobility. Baseline: To be provided. Goal status: INITIAL  LONG TERM GOALS: Target date: 12/15/2022  Patient to be independent with finalized strength and balance HEP to include PWR! If appropriate for patient level in order to promote improved functional mobility. Baseline:  To be established. Goal status: INITIAL  2.  Pt will ambulate >/=1200 feet on to demonstrate improved functional endurance for home and community participation. Baseline: 1019 ft w/ rollator Goal status: INITIAL  3.  Pt will demonstrate increased miniBEST score of >/=24/28 for improved dynamic balance and postural responses to decrease risk of falls. Baseline:  16/28 (4/2) Goal status: INITIAL  4.  Patient will require no more than modA for floor recovery in order to promote safe recovery from falls with caregiver at home. Baseline: Pt needs review due to fluctuation w/ cognition changes. Goal status: INITIAL  ASSESSMENT:  CLINICAL IMPRESSION: Emphasis of skilled PT on addressing increased amplitude of movement in sagittal plane.  Patient has difficulty with coordination of reciprocal movement noted with arm swing task and SLS noted during hurdles.  He continues to benefit from skilled PT to address fall recovery and  further NMR for general balance and safety.  OBJECTIVE IMPAIRMENTS: Abnormal gait, decreased activity tolerance, decreased balance, decreased cognition, decreased coordination, decreased endurance, difficulty walking, improper body mechanics, postural dysfunction, and orthostatic BP .   ACTIVITY LIMITATIONS: carrying, lifting, bending, squatting, stairs, and locomotion level  PARTICIPATION LIMITATIONS: meal prep, cleaning, laundry, driving, and community activity  PERSONAL FACTORS: Age, Past/current experiences, Time since onset of injury/illness/exacerbation, Transportation, and 1-2 comorbidities: cervical myelopathy and macular degeneration  are also affecting patient's functional outcome.   REHAB POTENTIAL: Good  CLINICAL DECISION MAKING: Evolving/moderate complexity  EVALUATION COMPLEXITY: Moderate  PLAN:  PT FREQUENCY: 2x/week  PT DURATION: 8 weeks  PLANNED INTERVENTIONS: Therapeutic exercises, Therapeutic activity, Neuromuscular re-education, Balance training, Gait training, Patient/Family education, Self Care, Stair training, Vestibular training, DME instructions, and Re-evaluation  PLAN FOR NEXT SESSION: Pt has standing orthostatic BP!  Add to HEP for balance. Assess R inattention, gait training w/ vs w/o rollator (pt and daughter's goal to get away from AD), floor recovery, tossing cloth for arm swing-difficulty w/ reciprocal coordination, dot drills, blaze pods, hurdles/adv-retreat   Sadie Haber, PT, DPT 11/03/2022, 12:41 PM

## 2022-11-03 NOTE — Patient Instructions (Signed)
Tips to reduce freezing episodes with standing or walking:   Stand tall with your feet wide, so that you can rock and weight shift through your hips. Stpehen't try to fight the freeze: if you begin taking slower, faster, smaller steps, STOP, get your posture tall, and RESET your posture and balance.  Take a deep breath before taking the BIG step to start again. March in place, with high knee stepping, to get started walking again. Use auditory cues:  Count out loud, think of a familiar tune or song or cadence, use pocket metronome, to use rhythm to get started walking again. Use visual cues:  Use a line to step over, use laser pointer line to step over, (using BIG steps) to start walking again. Use visual targets to keep your posture tall (look ahead and focus on an object or target at eye level). As you approach where your destination with walking, count your steps out loud and/or focus on your target with your eyes until you are fully there. Use appropriate assistive device, as advised by your physical therapist to assist with taking longer, consistent steps.   

## 2022-11-07 ENCOUNTER — Ambulatory Visit: Payer: Medicare Other | Admitting: Physical Therapy

## 2022-11-07 VITALS — BP 104/56 | HR 63

## 2022-11-07 DIAGNOSIS — R29818 Other symptoms and signs involving the nervous system: Secondary | ICD-10-CM | POA: Diagnosis not present

## 2022-11-07 DIAGNOSIS — R2681 Unsteadiness on feet: Secondary | ICD-10-CM | POA: Diagnosis not present

## 2022-11-07 DIAGNOSIS — R293 Abnormal posture: Secondary | ICD-10-CM | POA: Diagnosis not present

## 2022-11-07 DIAGNOSIS — M6281 Muscle weakness (generalized): Secondary | ICD-10-CM

## 2022-11-07 DIAGNOSIS — R2689 Other abnormalities of gait and mobility: Secondary | ICD-10-CM | POA: Diagnosis not present

## 2022-11-07 NOTE — Therapy (Signed)
OUTPATIENT PHYSICAL THERAPY NEURO TREATMENT   Patient Name: [facility] MRN: [id number] DOB:Feb 23, 1941, 82 y.o., male Today's Date: 11/07/2022   PCP: Seth Martyr, MD REFERRING PROVIDER: [name], MD  END OF SESSION:  PT End of Session - 11/07/22 1019     Visit Number 7    Number of Visits 17   16 + eval   Date for PT Re-Evaluation 12/22/22   pushed out due to scheduling   Authorization Type MEDICARE PART A AND B    Progress Note Due on Visit 10    PT Start Time 1018    PT Stop Time 1102    PT Time Calculation (min) 44 min    Equipment Utilized During Treatment Gait belt    Activity Tolerance Patient tolerated treatment well    Behavior During Therapy WFL for tasks assessed/performed               Past Medical History:  Diagnosis Date   Acute pharyngitis    AKI (acute kidney injury) 09/29/2020   Allergic rhinitis    Benign prostatic hyperplasia with lower urinary tract symptoms 09/23/2020   Bradykinesia    Cervical spondylosis without myelopathy 12/18/2019   Chronic back pain    Chronic sinusitis    Degeneration of lumbar intervertebral disc 08/20/2020   ED (erectile dysfunction)    Enlarged prostate 12/15/2020   Essential hypertension    nuclear test in epic 01-16-2020 normal perfusion no ischemia, normal lvf and wall motion, nuclear ef 65%   Family history of hemochromatosis    Fatigue    Gastroesophageal reflux disease without esophagitis 12/18/2019   Generalized weakness    Glaucoma, both eyes    Hardening of the aorta (main artery of the heart)    Hemorrhoids    History of syncope    cardiology evaluation by dr [facility], note in epic 12-26-2019, work-up includes event monitor 02-04-2020( showed SR with occ PAC & PVC rare PAT)/ echo  12-26-2019 (mild LVH, ef 50-55%, moderate MR, mild to moderate AV sclerosis no stenosis, ascending aorta 39mm) and nuclear test 01-16-2020 (normal perfusion w/ normal LV and wall motion, nuclear ef 65%)    Hyperglycemia 12/18/2019   Hypogonadism in male    Leukocytosis 09/29/2020   Macular degeneration, right eye    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   Mixed hyperlipidemia    Onychomycosis 12/18/2019   Osteoarthritis of knee 12/18/2019   Pain in hand 12/18/2019   Parkinson's disease 08/2020   Renal stone 12/18/2019   Sebaceous cyst 12/18/2019   Seizure-like activity 09/29/2020   Spinal stenosis of lumbar region 08/20/2020   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   SHOULDER SURGERY Right 1990's   TONSILLECTOMY  age 24   TRANSURETHRAL RESECTION OF PROSTATE N/A 09/23/2020   Procedure: TRANSURETHRAL RESECTION OF THE PROSTATE (TURP);  Surgeon: Seth Matar, MD;  Location: [facility];  Service: Urology;  Laterality: N/A;  37 MINS   Patient Active Problem List   Diagnosis Date Noted   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  Allergic rhinitis    Chronic sinusitis    Enlarged prostate    Hardening of the aorta (main artery of the heart)    Vitamin B12 deficiency    Seizure-like activity 09/29/2020   Parkinson's disease 09/29/2020   Leukocytosis 09/29/2020   Benign prostatic hyperplasia with lower urinary tract symptoms 09/23/2020   Degeneration of lumbar intervertebral disc 08/20/2020   Spinal stenosis of lumbar region 08/20/2020   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/2019   Hemorrhoids 12/18/2019   Vitamin D deficiency 12/18/2019   Male erectile dysfunction 12/18/2019   Osteoarthritis of knee 12/18/2019   Impaired fasting glucose 12/18/2019    Gastroesophageal reflux disease without esophagitis 12/18/2019   Essential hypertension 12/18/2019   Mixed hyperlipidemia 12/18/2019   Fatigue 12/18/2019   Pain in hand 12/18/2019   Onychomycosis 12/18/2019   Cervical spondylosis without myelopathy 12/18/2019   Toxic effect of venom of wasps 12/18/2019   Right flank pain 12/18/2019   Family history of hemochromatosis 12/18/2019   Pure hypercholesterolemia 12/18/2019   Hyperglycemia 12/18/2019   Shoulder joint pain 01/29/2019    ONSET DATE: 10/11/2022  REFERRING DIAG: I63.81 (ICD-10-CM) - Other cerebral infarction due to occlusion or stenosis of small artery  THERAPY DIAG:  Unsteadiness on feet  Muscle weakness (generalized)  Other abnormalities of gait and mobility  Rationale for Evaluation and Treatment: Rehabilitation  SUBJECTIVE:                                                                                                                                                                                             SUBJECTIVE STATEMENT: Pt reports being more dizzy this weekend and this morning. Did not check his BP this morning. HEP is "not really great", dealing with death in the family so has been busy. No falls.   Pt accompanied by: family member-Daughter, [facility] in lobby w/ wife  PERTINENT HISTORY: GERD, HTN, cervical myelopathy, Parkinson's Disease w/ mild neurocognitive disorder (diagnosed 2022), lumbar spinal stenosis, macular degeneration with difficulty with depth perception  From neurology note 09/06/2022: On 08/10/22, his daughter called to report a sudden clinical decline: Began requiring Rollator to walk short distances, had been going a mile or so by himself without Rollator. He is tired. His BP is high sitting and standing now. I advised he go to Urgent Care today to rule out any infections going on (& since they have traveled out of town and he experienced this sudden change.)  On 08/21/22, he messaged: "I  had sudden onset weakness, light headedness, feeling bad and BP fluctuating very high. Went to local ER and had CT scan which showed possible area  of stroke. Transferred to [facility] and admitted. Had MRI, MRA, Heart & carotid arteries ultrasound, along with EKG and extensive bloodwork. MRI didn't show a stroke & no definitive reason for flare up."   Pt is known to clinic from prior episode ending 12/23/2020.  PAIN:  Are you having pain? No  VITALS (LUE in sitting then standing prior to session onset): Vitals:   11/07/22 1022 11/07/22 1025  BP: (!) 140/68 (!) 104/56  Pulse: (!) 55 63    PRECAUTIONS: Fall and Other: orthostatic BP  WEIGHT BEARING RESTRICTIONS: No  FALLS: Has patient fallen in last 6 months? Yes. Number of falls 2-fell in the yard last week when stepping in a hole  LIVING ENVIRONMENT: Lives with: lives with their spouse-has one small dog Lives in: House/apartment Stairs: Yes: Internal: 14-16 steps; on left going up and External: 3 steps; on right going up Has following equipment at home: Single point cane, Environmental consultant - 2 wheeled, Environmental consultant - 4 wheeled, Wheelchair (manual), Shower bench, Grab bars, and chair lift and insertable bed rails  PLOF: Independent with community mobility with device, Independent with transfers, Requires assistive device for independence, Needs assistance with ADLs, and supervision for ADLs  PATIENT GOALS: "To get back to where I was before our incident in [place]."  OBJECTIVE:    DIAGNOSTIC FINDINGS:  DAT scan 03/15/2021 IMPRESSION: Tomma Rakers are irregular-shaped with decreased radiotracer activity. Loss of normal activity activity is greater on the LEFT. Findings are suggestive of Parkinsonian syndrome pathology.  COGNITION: Overall cognitive status: History of cognitive impairments - at baseline   SENSATION: WFL  COORDINATION: LE RAMS:  slow and deliberate Heel-to-shin: WFL bilaterally  EDEMA:  None noted in  BLE  POSTURE: rounded shoulders and forward head  LOWER EXTREMITY ROM:     Active  Right Eval Left Eval  Hip flexion Grossly WFL  Hip extension   Hip abduction   Hip adduction   Hip internal rotation   Hip external rotation   Knee flexion   Knee extension   Ankle dorsiflexion   Ankle plantarflexion   Ankle inversion    Ankle eversion     (Blank rows = not tested)  LOWER EXTREMITY MMT:    MMT Right Eval Left Eval  Hip flexion 4+/5 4+/5  Hip extension    Hip abduction    Hip adduction    Hip internal rotation    Hip external rotation    Knee flexion    Knee extension 5/5 5/5  Ankle dorsiflexion 4+/5 4/5  Ankle plantarflexion    Ankle inversion    Ankle eversion    (Blank rows = not tested)  BED MOBILITY:  Sit to supine Modified independence Supine to sit Modified independence *uses bed rail intermittently  TODAY'S TREATMENT:    Ther Act  Assessed pt's BP in sitting and standing (see above) and pt more symptomatic w/orthostatics today. Reviewed importance of wearing compression socks/abdominal binder, but pt reports he lost these items and has difficulty donning compression socks. Provided pt w/cup of water to drink and encouraged increased fluid intake, pt verbalized understanding   Ther Ex  SciFit multi-peaks level 6 for 8 minutes using BLEs for dynamic cardiovascular conditioning, BLE strength and increased amplitude of stepping. BP assessed following activity in LUE while seated: 150/70 mmHg, HR: 55 bpm. Pt asymptomatic.    NMR  Pt performed floor transfer mod I from mat table to floor mat x2. The following were performed on floor mat for improved lateral  weight shifting, truncal rotation and hip extension:  Alt spiderman lunges w/rotation, x10 per side. Pt w/increased difficulty performing on R side  Child's pose to tall kneel transition, x12 reps. Min cues to shift weight posteriorly in child's pose  Sit <>stands w/slam ball throw, x10 reps using 10#  medicine ball for improved anterior weight shift, power and high velocity movement. SBA for safety. Pt able to throw ball >20' without LOB, so progressed to adding blue wedge under B feet to bias posterior lean to facilitate more powerful hip extension and throw, x8 reps. Pt able to maintain ball distance of 20' even w/wedge and had single minor posterior LOB w/fatigue.  Pt reported difficulty pulling cord of generator due to not being able to pull cord fast enough, so discussed working on high amplitude and powerful movements in therapy to address this. Pt verbalized agreement.    PATIENT EDUCATION: Education details:  Continue HEP, review of wearing compression socks and increasing fluids  Person educated: Patient Education method: Explanation, Demonstration, and Verbal cues Education comprehension: verbalized understanding and needs further education  HOME EXERCISE PROGRAM: Seated PWR moves   Access Code: [email or web address] URL: [email or web address] Date: 10/27/2022 Prepared by: Sherlie Ban  Exercises - Side to Side Weight Shift with Overhead Reach and Counter Support  - 1-2 x daily - 5 x weekly - 2 sets - 10 reps - Step Sideways with Arms Reaching  - 1-2 x daily - 5 x weekly - 2 sets - 10 reps  GOALS: Goals reviewed with patient? Yes  SHORT TERM GOALS: Target date: 11/17/2022  Pt will be independent with initial strength and balance HEP to improve quality of movement. Baseline:  To be established. Goal status: INITIAL  2.  Pt will decrease 5xSTS to </=12 seconds w/o UE support or LE touching chair in order to demonstrate decreased risk for falls and improved functional bilateral LE strength and power. Baseline: 12.54 seconds w/ hands-on-knees and x1 instance of back of legs on chair Goal status: INITIAL  3.  to be assessed w/ goal set as appropriate. Baseline: LTG set only due to initial performance. Goal status: REVISED-D/C' (4/2)  4.  Pt will demonstrate  increased miniBEST score of >/=20/28 for improved dynamic balance and postural responses to decrease risk of falls. Baseline: 16/28 (4/2) Goal status: INITIAL  5.  Fall prevention and freezing management strategies to be reviewed with patient to promote improved safety with functional mobility. Baseline: To be provided. Goal status: INITIAL  LONG TERM GOALS: Target date: 12/15/2022  Patient to be independent with finalized strength and balance HEP to include PWR! If appropriate for patient level in order to promote improved functional mobility. Baseline:  To be established. Goal status: INITIAL  2.  Pt will ambulate >/=1200 feet on to demonstrate improved functional endurance for home and community participation. Baseline: 1019 ft w/ rollator Goal status: INITIAL  3.  Pt will demonstrate increased miniBEST score of >/=24/28 for improved dynamic balance and postural responses to decrease risk of falls. Baseline:  16/28 (4/2) Goal status: INITIAL  4.  Patient will require no more than modA for floor recovery in order to promote safe recovery from falls with caregiver at home. Baseline: Pt needs review due to fluctuation w/ cognition changes. Goal status: INITIAL  ASSESSMENT:  CLINICAL IMPRESSION: Emphasis of skilled PT session on endurance, truncal mobility, floor transfers and high amplitude movements. Pt symptomatic w/orthostatics initially, but responded well to cardio. Pt able to perform floor transfers mod I  and reports he frequently assists his wife off the floor at home. Had pt demonstrate this and pt performed well. Pt states he is having difficulty performing fast movements and enjoyed the slam ball throws. Will continue to work on high amplitude movements in clinic to assist w/ADLs at home. Continue POC.   OBJECTIVE IMPAIRMENTS: Abnormal gait, decreased activity tolerance, decreased balance, decreased cognition, decreased coordination, decreased endurance, difficulty  walking, improper body mechanics, postural dysfunction, and orthostatic BP .   ACTIVITY LIMITATIONS: carrying, lifting, bending, squatting, stairs, and locomotion level  PARTICIPATION LIMITATIONS: meal prep, cleaning, laundry, driving, and community activity  PERSONAL FACTORS: Age, Past/current experiences, Time since onset of injury/illness/exacerbation, Transportation, and 1-2 comorbidities: cervical myelopathy and macular degeneration  are also affecting patient's functional outcome.   REHAB POTENTIAL: Good  CLINICAL DECISION MAKING: Evolving/moderate complexity  EVALUATION COMPLEXITY: Moderate  PLAN:  PT FREQUENCY: 2x/week  PT DURATION: 8 weeks  PLANNED INTERVENTIONS: Therapeutic exercises, Therapeutic activity, Neuromuscular re-education, Balance training, Gait training, Patient/Family education, Self Care, Stair training, Vestibular training, DME instructions, and Re-evaluation  PLAN FOR NEXT SESSION: Pt has standing orthostatic BP! Re-discuss SLP. High velocity movements to mimic pulling cord of generator quickly. Add to HEP for balance. Assess R inattention, gait training w/ vs w/o rollator (pt and daughter's goal to get away from AD), dot drills, blaze pods, hurdles/adv-retreat   Jill Alexanders Tiona Ruane, PT, DPT 11/07/2022, 11:05 AM

## 2022-11-10 ENCOUNTER — Ambulatory Visit: Payer: Medicare Other | Admitting: Physical Therapy

## 2022-11-14 ENCOUNTER — Ambulatory Visit: Payer: Medicare Other | Admitting: Physical Therapy

## 2022-11-14 ENCOUNTER — Encounter: Payer: Self-pay | Admitting: Physical Therapy

## 2022-11-14 VITALS — BP 138/64 | HR 67

## 2022-11-14 DIAGNOSIS — R293 Abnormal posture: Secondary | ICD-10-CM

## 2022-11-14 DIAGNOSIS — R29818 Other symptoms and signs involving the nervous system: Secondary | ICD-10-CM

## 2022-11-14 DIAGNOSIS — R2689 Other abnormalities of gait and mobility: Secondary | ICD-10-CM | POA: Diagnosis not present

## 2022-11-14 DIAGNOSIS — R2681 Unsteadiness on feet: Secondary | ICD-10-CM | POA: Diagnosis not present

## 2022-11-14 DIAGNOSIS — M6281 Muscle weakness (generalized): Secondary | ICD-10-CM | POA: Diagnosis not present

## 2022-11-14 NOTE — Therapy (Signed)
OUTPATIENT PHYSICAL THERAPY NEURO TREATMENT   Patient Name: Seth Carter MRN: [id number] DOB:09/25/1940, 82 y.o., male Today's Date: 11/14/2022   PCP: [name], MD REFERRING PROVIDER: [name], MD  END OF SESSION:  PT End of Session - 11/14/22 1019     Visit Number 8    Number of Visits 17   16 + eval   Date for PT Re-Evaluation 12/22/22   pushed out due to scheduling   Authorization Type MEDICARE PART A AND B    Progress Note Due on Visit 10    PT Start Time 1018   pt late   PT Stop Time 1059    PT Time Calculation (min) 41 min    Equipment Utilized During Treatment Gait belt    Activity Tolerance Patient tolerated treatment well    Behavior During Therapy WFL for tasks assessed/performed               Past Medical History:  Diagnosis Date   Acute pharyngitis    AKI (acute kidney injury) 09/29/2020   Allergic rhinitis    Benign prostatic hyperplasia with lower urinary tract symptoms 09/23/2020   Bradykinesia    Cervical spondylosis without myelopathy 12/18/2019   Chronic back pain    Chronic sinusitis    Degeneration of lumbar intervertebral disc 08/20/2020   ED (erectile dysfunction)    Enlarged prostate 12/15/2020   Essential hypertension    nuclear test in epic 01-16-2020 normal perfusion no ischemia, normal lvf and wall motion, nuclear ef 65%   Family history of hemochromatosis    Fatigue    Gastroesophageal reflux disease without esophagitis 12/18/2019   Generalized weakness    Glaucoma, both eyes    Hardening of the aorta (main artery of the heart)    Hemorrhoids    History of syncope    cardiology evaluation by [name], note in epic 12-26-2019, work-up includes event monitor 02-04-2020( showed SR with occ PAC & PVC rare PAT)/ echo  12-26-2019 (mild LVH, ef 50-55%, moderate MR, mild to moderate AV sclerosis no stenosis, ascending aorta 39mm) and nuclear test 01-16-2020 (normal perfusion w/ normal LV and wall motion, nuclear ef 65%)    Hyperglycemia 12/18/2019   Hypogonadism in male    Leukocytosis 09/29/2020   Macular degeneration, right eye    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   Mixed hyperlipidemia    Onychomycosis 12/18/2019   Osteoarthritis of knee 12/18/2019   Pain in hand 12/18/2019   Parkinson's disease 08/2020   Renal stone 12/18/2019   Sebaceous cyst 12/18/2019   Seizure-like activity 09/29/2020   Spinal stenosis of lumbar region 08/20/2020   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   SHOULDER SURGERY Right 1990's   TONSILLECTOMY  age 41   TRANSURETHRAL RESECTION OF PROSTATE N/A 09/23/2020   Procedure: TRANSURETHRAL RESECTION OF THE PROSTATE (TURP);  Surgeon: [name], MD;  Location: [facility];  Service: Urology;  Laterality: N/A;  88 MINS   Patient Active Problem List   Diagnosis Date Noted   Mild neurocognitive disorder due to  Parkinson's disease 12/15/2020   Allergic rhinitis    Chronic sinusitis    Enlarged prostate    Hardening of the aorta (main artery of the heart)    Vitamin B12 deficiency    Seizure-like activity 09/29/2020   Parkinson's disease 09/29/2020   Leukocytosis 09/29/2020   Benign prostatic hyperplasia with lower urinary tract symptoms 09/23/2020   Degeneration of lumbar intervertebral disc 08/20/2020   Spinal stenosis of lumbar region 08/20/2020   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/2019   Hemorrhoids 12/18/2019   Vitamin D deficiency 12/18/2019   Male erectile dysfunction 12/18/2019   Osteoarthritis of knee 12/18/2019   Impaired fasting glucose 12/18/2019    Gastroesophageal reflux disease without esophagitis 12/18/2019   Essential hypertension 12/18/2019   Mixed hyperlipidemia 12/18/2019   Fatigue 12/18/2019   Pain in hand 12/18/2019   Onychomycosis 12/18/2019   Cervical spondylosis without myelopathy 12/18/2019   Toxic effect of venom of wasps 12/18/2019   Right flank pain 12/18/2019   Family history of hemochromatosis 12/18/2019   Pure hypercholesterolemia 12/18/2019   Hyperglycemia 12/18/2019   Shoulder joint pain 01/29/2019    ONSET DATE: 10/11/2022  REFERRING DIAG: I63.81 (ICD-10-CM) - Other cerebral infarction due to occlusion or stenosis of small artery  THERAPY DIAG:  Unsteadiness on feet  Muscle weakness (generalized)  Other abnormalities of gait and mobility  Other symptoms and signs involving the nervous system  Abnormal posture  Rationale for Evaluation and Treatment: Rehabilitation  SUBJECTIVE:                                                                                                                                                                                             SUBJECTIVE STATEMENT: Pt reports being a little dizzy this morning, but this has resolved.  No falls.    Pt accompanied by: family member-Daughter, [name] in lobby w/ wife  PERTINENT HISTORY: GERD, HTN, cervical myelopathy, Parkinson's Disease w/ mild neurocognitive disorder (diagnosed 2022), lumbar spinal stenosis, macular degeneration with difficulty with depth perception  From neurology note 09/06/2022: On 08/10/22, his daughter called to report a sudden clinical decline: Began requiring Rollator to walk short distances, had been going a mile or so by himself without Rollator. He is tired. His BP is high sitting and standing now. I advised he go to Urgent Care today to rule out any infections going on (& since they have traveled out of town and he experienced this sudden change.)  On 08/21/22, he messaged: "I had sudden onset  weakness, light headedness, feeling bad and BP fluctuating very high. Went to local ER and had CT scan which showed possible area of  stroke. Transferred to [facility] and admitted. Had MRI, MRA, Heart & carotid arteries ultrasound, along with EKG and extensive bloodwork. MRI didn't show a stroke & no definitive reason for flare up."   Pt is known to clinic from prior episode ending 12/23/2020.  PAIN:  Are you having pain? No  VITALS (LUE in sitting then standing prior to session onset): Vitals:   11/14/22 1019 11/14/22 1020  BP: (!) 166/78 138/64  Pulse: 60 67    PRECAUTIONS: Fall and Other: orthostatic BP  WEIGHT BEARING RESTRICTIONS: No  FALLS: Has patient fallen in last 6 months? Yes. Number of falls 2-fell in the yard last week when stepping in a hole  LIVING ENVIRONMENT: Lives with: lives with their spouse-has one small dog Lives in: House/apartment Stairs: Yes: Internal: 14-16 steps; on left going up and External: 3 steps; on right going up Has following equipment at home: Single point cane, Environmental consultant - 2 wheeled, Environmental consultant - 4 wheeled, Wheelchair (manual), Shower bench, Grab bars, and chair lift and insertable bed rails  PLOF: Independent with community mobility with device, Independent with transfers, Requires assistive device for independence, Needs assistance with ADLs, and supervision for ADLs  PATIENT GOALS: "To get back to where I was before our incident in [place]."  OBJECTIVE:   DIAGNOSTIC FINDINGS:  DAT scan 03/15/2021 IMPRESSION: Tomma Rakers are irregular-shaped with decreased radiotracer activity. Loss of normal activity activity is greater on the LEFT. Findings are suggestive of Parkinsonian syndrome pathology.  COGNITION: Overall cognitive status: History of cognitive impairments - at baseline   SENSATION: WFL  COORDINATION: LE RAMS:  slow and deliberate Heel-to-shin: WFL bilaterally  EDEMA:  None noted in BLE  POSTURE: rounded shoulders  and forward head  LOWER EXTREMITY ROM:     Active  Right Eval Left Eval  Hip flexion Grossly WFL  Hip extension   Hip abduction   Hip adduction   Hip internal rotation   Hip external rotation   Knee flexion   Knee extension   Ankle dorsiflexion   Ankle plantarflexion   Ankle inversion    Ankle eversion     (Blank rows = not tested)  LOWER EXTREMITY MMT:    MMT Right Eval Left Eval  Hip flexion 4+/5 4+/5  Hip extension    Hip abduction    Hip adduction    Hip internal rotation    Hip external rotation    Knee flexion    Knee extension 5/5 5/5  Ankle dorsiflexion 4+/5 4/5  Ankle plantarflexion    Ankle inversion    Ankle eversion    (Blank rows = not tested)  BED MOBILITY:  Sit to supine Modified independence Supine to sit Modified independence *uses bed rail intermittently  TODAY'S TREATMENT:    Ther Act  Assessed pt's BP in sitting and standing (see above), pt asymptomatic.  Orange theraband (15-35 lbs) forward bent rows > rotational trunk and scap squeeze components added for improved mechanics 3x15 quick pulls to mimic generator cord, discussed using staggered stance and LUE for balance when doing this.  He does need cuing to improve the amount of extension needed for biomechanical advantage when pulling cord.  NMR  Advance retreat over 4" hurdle progressing to no UE support over several reps Part practice using red and blue cloth for improved arm swing tossing and catching cloth > added forward step using single side for 15 reps, repeated w/ contralateral side and color > forward step w/ toss x12 reps each side,  intermittent CGA due to small lateral LOB Blaze pods 3 rounds: 1 minute, 20 sec rest 13 > 25 > 27 taps Setup: 2 pods on floor for BLE taps and 4 pods outside BOS and overhead reach Dot drills:  semi-circle formation using called foot and color progressed to sequence of 2 sets encouraging pivot on LE and trunk rotation in addition to dual  tasking  PATIENT EDUCATION: Education details:  Continue HEP, safety and modification of performance with generator cord. Person educated: Patient Education method: Explanation, Demonstration, and Verbal cues Education comprehension: verbalized understanding and needs further education  HOME EXERCISE PROGRAM: Seated PWR moves   Access Code: [email or web address] URL: [email or web address] Date: 10/27/2022 Prepared by: Sherlie Ban  Exercises - Side to Side Weight Shift with Overhead Reach and Counter Support  - 1-2 x daily - 5 x weekly - 2 sets - 10 reps - Step Sideways with Arms Reaching  - 1-2 x daily - 5 x weekly - 2 sets - 10 reps  GOALS: Goals reviewed with patient? Yes  SHORT TERM GOALS: Target date: 11/17/2022  Pt will be independent with initial strength and balance HEP to improve quality of movement. Baseline:  To be established. Goal status: INITIAL  2.  Pt will decrease 5xSTS to </=12 seconds w/o UE support or LE touching chair in order to demonstrate decreased risk for falls and improved functional bilateral LE strength and power. Baseline: 12.54 seconds w/ hands-on-knees and x1 instance of back of legs on chair Goal status: INITIAL  3.  to be assessed w/ goal set as appropriate. Baseline: LTG set only due to initial performance. Goal status: REVISED-D/C' (4/2)  4.  Pt will demonstrate increased miniBEST score of >/=20/28 for improved dynamic balance and postural responses to decrease risk of falls. Baseline: 16/28 (4/2) Goal status: INITIAL  5.  Fall prevention and freezing management strategies to be reviewed with patient to promote improved safety with functional mobility. Baseline: To be provided. Goal status: INITIAL  LONG TERM GOALS: Target date: 12/15/2022  Patient to be independent with finalized strength and balance HEP to include PWR! If appropriate for patient level in order to promote improved functional mobility. Baseline:  To be  established. Goal status: INITIAL  2.  Pt will ambulate >/=1200 feet on to demonstrate improved functional endurance for home and community participation. Baseline: 1019 ft w/ rollator Goal status: INITIAL  3.  Pt will demonstrate increased miniBEST score of >/=24/28 for improved dynamic balance and postural responses to decrease risk of falls. Baseline:  16/28 (4/2) Goal status: INITIAL  4.  Patient will require no more than modA for floor recovery in order to promote safe recovery from falls with caregiver at home. Baseline: Pt needs review due to fluctuation w/ cognition changes. Goal status: INITIAL  ASSESSMENT:  CLINICAL IMPRESSION:   OBJECTIVE IMPAIRMENTS: Abnormal gait, decreased activity tolerance, decreased balance, decreased cognition, decreased coordination, decreased endurance, difficulty walking, improper body mechanics, postural dysfunction, and orthostatic BP .   ACTIVITY LIMITATIONS: carrying, lifting, bending, squatting, stairs, and locomotion level  PARTICIPATION LIMITATIONS: meal prep, cleaning, laundry, driving, and community activity  PERSONAL FACTORS: Age, Past/current experiences, Time since onset of injury/illness/exacerbation, Transportation, and 1-2 comorbidities: cervical myelopathy and macular degeneration  are also affecting patient's functional outcome.   REHAB POTENTIAL: Good  CLINICAL DECISION MAKING: Evolving/moderate complexity  EVALUATION COMPLEXITY: Moderate  PLAN:  PT FREQUENCY: 2x/week  PT DURATION: 8 weeks  PLANNED INTERVENTIONS: Therapeutic exercises, Therapeutic activity, Neuromuscular re-education, Balance training, Gait training,  Patient/Family education, Self Care, Stair training, Vestibular training, DME instructions, and Re-evaluation  PLAN FOR NEXT SESSION: ASSESS STGS!  Pt has standing orthostatic BP! Re-discuss SLP. High velocity movements to mimic pulling cord of generator quickly. Add to HEP for balance. Assess R  inattention, gait training w/ vs w/o rollator (pt and daughter's goal to get away from AD), dot drills, blaze pods, boxing   Sadie Haber, PT, DPT 11/14/2022, 10:59 AM

## 2022-11-17 ENCOUNTER — Ambulatory Visit: Payer: Medicare Other | Admitting: Physical Therapy

## 2022-11-17 VITALS — BP 109/58 | HR 64

## 2022-11-17 DIAGNOSIS — R2681 Unsteadiness on feet: Secondary | ICD-10-CM | POA: Diagnosis not present

## 2022-11-17 DIAGNOSIS — R2689 Other abnormalities of gait and mobility: Secondary | ICD-10-CM

## 2022-11-17 DIAGNOSIS — R29818 Other symptoms and signs involving the nervous system: Secondary | ICD-10-CM | POA: Diagnosis not present

## 2022-11-17 DIAGNOSIS — M6281 Muscle weakness (generalized): Secondary | ICD-10-CM

## 2022-11-17 DIAGNOSIS — R293 Abnormal posture: Secondary | ICD-10-CM | POA: Diagnosis not present

## 2022-11-17 NOTE — Therapy (Signed)
OUTPATIENT PHYSICAL THERAPY NEURO TREATMENT   Patient Name: Seth Carter MRN: [id number] DOB:Sep 09, 1940, 82 y.o., male Today's Date: 11/17/2022   PCP: [name], MD REFERRING PROVIDER: [name], MD  END OF SESSION:  PT End of Session - 11/17/22 1018     Visit Number 9    Number of Visits 17   16 + eval   Date for PT Re-Evaluation 12/22/22   pushed out due to scheduling   Authorization Type MEDICARE PART A AND B    Progress Note Due on Visit 10    PT Start Time 1017    PT Stop Time 1100    PT Time Calculation (min) 43 min    Equipment Utilized During Treatment Gait belt    Activity Tolerance Patient tolerated treatment well    Behavior During Therapy WFL for tasks assessed/performed                Past Medical History:  Diagnosis Date   Acute pharyngitis    AKI (acute kidney injury) 09/29/2020   Allergic rhinitis    Benign prostatic hyperplasia with lower urinary tract symptoms 09/23/2020   Bradykinesia    Cervical spondylosis without myelopathy 12/18/2019   Chronic back pain    Chronic sinusitis    Degeneration of lumbar intervertebral disc 08/20/2020   ED (erectile dysfunction)    Enlarged prostate 12/15/2020   Essential hypertension    nuclear test in epic 01-16-2020 normal perfusion no ischemia, normal lvf and wall motion, nuclear ef 65%   Family history of hemochromatosis    Fatigue    Gastroesophageal reflux disease without esophagitis 12/18/2019   Generalized weakness    Glaucoma, both eyes    Hardening of the aorta (main artery of the heart)    Hemorrhoids    History of syncope    cardiology evaluation by [name], note in epic 12-26-2019, work-up includes event monitor 02-04-2020( showed SR with occ PAC & PVC rare PAT)/ echo  12-26-2019 (mild LVH, ef 50-55%, moderate MR, mild to moderate AV sclerosis no stenosis, ascending aorta 39mm) and nuclear test 01-16-2020 (normal perfusion w/ normal LV and wall motion, nuclear ef 65%)    Hyperglycemia 12/18/2019   Hypogonadism in male    Leukocytosis 09/29/2020   Macular degeneration, right eye    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   Mixed hyperlipidemia    Onychomycosis 12/18/2019   Osteoarthritis of knee 12/18/2019   Pain in hand 12/18/2019   Parkinson's disease 08/2020   Renal stone 12/18/2019   Sebaceous cyst 12/18/2019   Seizure-like activity 09/29/2020   Spinal stenosis of lumbar region 08/20/2020   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   SHOULDER SURGERY Right 1990's   TONSILLECTOMY  age 22   TRANSURETHRAL RESECTION OF PROSTATE N/A 09/23/2020   Procedure: TRANSURETHRAL RESECTION OF THE PROSTATE (TURP);  Surgeon: [name], MD;  Location: [facility];  Service: Urology;  Laterality: N/A;  16 MINS   Patient Active Problem List   Diagnosis Date Noted   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 disease  12/15/2020   Allergic rhinitis    Chronic sinusitis    Enlarged prostate    Hardening of the aorta (main artery of the heart)    Vitamin B12 deficiency    Seizure-like activity 09/29/2020   Parkinson's disease 09/29/2020   Leukocytosis 09/29/2020   Benign prostatic hyperplasia with lower urinary tract symptoms 09/23/2020   Degeneration of lumbar intervertebral disc 08/20/2020   Spinal stenosis of lumbar region 08/20/2020   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/2019   Hemorrhoids 12/18/2019   Vitamin D deficiency 12/18/2019   Male erectile dysfunction 12/18/2019   Osteoarthritis of knee 12/18/2019   Impaired fasting glucose 12/18/2019    Gastroesophageal reflux disease without esophagitis 12/18/2019   Essential hypertension 12/18/2019   Mixed hyperlipidemia 12/18/2019   Fatigue 12/18/2019   Pain in hand 12/18/2019   Onychomycosis 12/18/2019   Cervical spondylosis without myelopathy 12/18/2019   Toxic effect of venom of wasps 12/18/2019   Right flank pain 12/18/2019   Family history of hemochromatosis 12/18/2019   Pure hypercholesterolemia 12/18/2019   Hyperglycemia 12/18/2019   Shoulder joint pain 01/29/2019    ONSET DATE: 10/11/2022  REFERRING DIAG: I63.81 (ICD-10-CM) - Other cerebral infarction due to occlusion or stenosis of small artery  THERAPY DIAG:  Unsteadiness on feet  Other abnormalities of gait and mobility  Muscle weakness (generalized)  Rationale for Evaluation and Treatment: Rehabilitation  SUBJECTIVE:                                                                                                                                                                                             SUBJECTIVE STATEMENT: Pt reports being dizzy this morning when got up, started waning when he was up for about an hour. Had one stumble this week when working on his washing machine, was a tight space and tripped over his own feet. No pain today   Pt accompanied by: family member-Daughter, [name] in lobby w/ wife  PERTINENT HISTORY: GERD, HTN, cervical myelopathy, Parkinson's Disease w/ mild neurocognitive disorder (diagnosed 2022), lumbar spinal stenosis, macular degeneration with difficulty with depth perception  From neurology note 09/06/2022: On 08/10/22, his daughter called to report a sudden clinical decline: Began requiring Rollator to walk short distances, had been going a mile or so by himself without Rollator. He is tired. His BP is high sitting and standing now. I advised he go to Urgent Care today to rule out any infections going on (& since they have traveled out of town and he experienced this  sudden change.)  On 08/21/22, he messaged: "I had sudden onset weakness, light headedness, feeling bad and BP fluctuating very high.  Went to local ER and had CT scan which showed possible area of stroke. Transferred to [facility] and admitted. Had MRI, MRA, Heart & carotid arteries ultrasound, along with EKG and extensive bloodwork. MRI didn't show a stroke & no definitive reason for flare up."   Pt is known to clinic from prior episode ending 12/23/2020.  PAIN:  Are you having pain? No  VITALS (LUE in sitting then standing prior to session onset): Vitals:   11/17/22 1022 11/17/22 1023  BP: (!) 153/70 (!) 109/58  Pulse: 60 64     PRECAUTIONS: Fall and Other: orthostatic BP  WEIGHT BEARING RESTRICTIONS: No  FALLS: Has patient fallen in last 6 months? Yes. Number of falls 2-fell in the yard last week when stepping in a hole  LIVING ENVIRONMENT: Lives with: lives with their spouse-has one small dog Lives in: House/apartment Stairs: Yes: Internal: 14-16 steps; on left going up and External: 3 steps; on right going up Has following equipment at home: Single point cane, Environmental consultant - 2 wheeled, Environmental consultant - 4 wheeled, Wheelchair (manual), Shower bench, Grab bars, and chair lift and insertable bed rails  PLOF: Independent with community mobility with device, Independent with transfers, Requires assistive device for independence, Needs assistance with ADLs, and supervision for ADLs  PATIENT GOALS: "To get back to where I was before our incident in [place]."  OBJECTIVE:   DIAGNOSTIC FINDINGS:  DAT scan 03/15/2021 IMPRESSION: Tomma Rakers are irregular-shaped with decreased radiotracer activity. Loss of normal activity activity is greater on the LEFT. Findings are suggestive of Parkinsonian syndrome pathology.  COGNITION: Overall cognitive status: History of cognitive impairments - at baseline   SENSATION: WFL  COORDINATION: LE RAMS:  slow and deliberate Heel-to-shin: WFL  bilaterally  EDEMA:  None noted in BLE  POSTURE: rounded shoulders and forward head  LOWER EXTREMITY ROM:     Active  Right Eval Left Eval  Hip flexion Grossly WFL  Hip extension   Hip abduction   Hip adduction   Hip internal rotation   Hip external rotation   Knee flexion   Knee extension   Ankle dorsiflexion   Ankle plantarflexion   Ankle inversion    Ankle eversion     (Blank rows = not tested)  LOWER EXTREMITY MMT:    MMT Right Eval Left Eval  Hip flexion 4+/5 4+/5  Hip extension    Hip abduction    Hip adduction    Hip internal rotation    Hip external rotation    Knee flexion    Knee extension 5/5 5/5  Ankle dorsiflexion 4+/5 4/5  Ankle plantarflexion    Ankle inversion    Ankle eversion    (Blank rows = not tested)  BED MOBILITY:  Sit to supine Modified independence Supine to sit Modified independence *uses bed rail intermittently  TODAY'S TREATMENT:    Ther Act  Assessed pt's BP in sitting and standing (see above), pt asymptomatic.   STG Assessment   [facility] PT Assessment - 11/17/22 1027       Transfers   Five time sit to stand comments  9.75s   No UE support or bracing against chair     Balance   Balance Assessed Yes      Standardized Balance Assessment   Standardized Balance Assessment Timed Up and Go Test      Mini-BESTest   Sit To Stand Normal: Comes to stand without use of hands and stabilizes independently.    Rise to Toes Moderate: Heels  up, but not full range (smaller than when holding hands), OR noticeable instability for 3 s.    Stand on one leg (left) Moderate: < 20 s   2.62s   Stand on one leg (right) Moderate: < 20 s   4.4s   Stand on one leg - lowest score 1    Compensatory Stepping Correction - Forward Normal: Recovers independently with a single, large step (second realignement is allowed).    Compensatory Stepping Correction - Backward Normal: Recovers independently with a single, large step    Compensatory Stepping  Correction - Left Lateral Normal: Recovers independently with 1 step (crossover or lateral OK)    Compensatory Stepping Correction - Right Lateral Moderate: Several steps to recover equilibrium   two small steps   Stepping Corredtion Lateral - lowest score 1    Stance - Feet together, eyes open, firm surface  Normal: 30s    Stance - Feet together, eyes closed, foam surface  Moderate: < 30s   <7s   Incline - Eyes Closed Normal: Stands independently 30s and aligns with gravity    Change in Gait Speed Moderate: Unable to change walking speed or signs of imbalance   Festination   Walk with head turns - Horizontal Moderate: performs head turns with reduction in gait speed.   Significant lateral deviations   Walk with pivot turns Moderate:Turns with feet close SLOW (>4 steps) with good balance.   FOG noted w/LLE, very slow turn w/sliding of L foot   Step over obstacles Normal: Able to step over box with minimal change of gait speed and with good balance.    Timed UP & GO with Dual Task Moderate: Dual Task affects either counting OR walking (>10%) when compared to the TUG without Dual Task.    Mini-BEST total score 20      Timed Up and Go Test   Normal TUG (seconds) 12.63    Cognitive TUG (seconds) 20.62   Retro coutning by 1s. Pt required cues to sit down   TUG Comments No AD            Vestibular Screen  Pt reports double vision w/peripheral vision and noted abducted pupil position of L eye. Assessed smooth pursuit and saccades, no abnormalities noted. Pt could benefit from further vestibular screen next session.   PATIENT EDUCATION: Education details:  Goal outcomes, continue HEP Person educated: Patient Education method: Explanation, Demonstration, and Verbal cues Education comprehension: verbalized understanding and needs further education  HOME EXERCISE PROGRAM: Seated PWR moves   Access Code: [email or web address] URL: [email or web address] Date: 10/27/2022 Prepared by: Sherlie Ban  Exercises - Side to Side Weight Shift with Overhead Reach and Counter Support  - 1-2 x daily - 5 x weekly - 2 sets - 10 reps - Step Sideways with Arms Reaching  - 1-2 x daily - 5 x weekly - 2 sets - 10 reps  GOALS: Goals reviewed with patient? Yes  SHORT TERM GOALS: Target date: 11/17/2022  Pt will be independent with initial strength and balance HEP to improve quality of movement. Baseline:  To be established. Goal status: IN PROGRESS  2.  Pt will decrease 5xSTS to </=12 seconds w/o UE support or LE touching chair in order to demonstrate decreased risk for falls and improved functional bilateral LE strength and power. Baseline: 12.54 seconds w/ hands-on-knees and x1 instance of back of legs on chair; 9.75s without UE support  Goal status: MET  3.  to be assessed w/ goal  set as appropriate. Baseline: LTG set only due to initial performance. Goal status: REVISED-D/C' (4/2)  4.  Pt will demonstrate increased miniBEST score of >/=20/28 for improved dynamic balance and postural responses to decrease risk of falls. Baseline: 16/28 (4/2); 20/28 (4/26) Goal status: MET  5.  Fall prevention and freezing management strategies to be reviewed with patient to promote improved safety with functional mobility. Baseline: To be provided. Goal status: IN PROGRESS  LONG TERM GOALS: Target date: 12/15/2022  Patient to be independent with finalized strength and balance HEP to include PWR! If appropriate for patient level in order to promote improved functional mobility. Baseline:  To be established. Goal status: INITIAL  2.  Pt will ambulate >/=1200 feet on to demonstrate improved functional endurance for home and community participation. Baseline: 1019 ft w/ rollator Goal status: INITIAL  3.  Pt will demonstrate increased miniBEST score of >/=24/28 for improved dynamic balance and postural responses to decrease risk of falls. Baseline:  16/28 (4/2) Goal status:  INITIAL  4.  Patient will require no more than modA for floor recovery in order to promote safe recovery from falls with caregiver at home. Baseline: Pt needs review due to fluctuation w/ cognition changes. Goal status: INITIAL  ASSESSMENT:  CLINICAL IMPRESSION: Emphasis of skilled PT session on STG assessment and vestibular screen. Pt has met 2 of 4 STGs, improving his MiniBest to 20/28 and performing 5x STS without UE support or retropulsion. Pt continues to have difficulty w/single leg stance and dynamic gait tasks and noted pt leaning posteriorly w/static stance this date, requiring CGA at all times. Pt unaware of posterior lean and reported having difficulty w/diplopia, especially when looking to L side. Performed short vestibular screen w/no abnormalities noted but pt could benefit from further screening. Pt appearing more depressed this date due to recent death in family, wife's mental status and inability to drive.  Due to this, pt has been unable to perform HEP at home and could not recall fall prevention techniques or freezing strategies provided in previous session. Provided therapeutic listening as appropriate. Continue POC.   OBJECTIVE IMPAIRMENTS: Abnormal gait, decreased activity tolerance, decreased balance, decreased cognition, decreased coordination, decreased endurance, difficulty walking, improper body mechanics, postural dysfunction, and orthostatic BP .   ACTIVITY LIMITATIONS: carrying, lifting, bending, squatting, stairs, and locomotion level  PARTICIPATION LIMITATIONS: meal prep, cleaning, laundry, driving, and community activity  PERSONAL FACTORS: Age, Past/current experiences, Time since onset of injury/illness/exacerbation, Transportation, and 1-2 comorbidities: cervical myelopathy and macular degeneration  are also affecting patient's functional outcome.   REHAB POTENTIAL: Good  CLINICAL DECISION MAKING: Evolving/moderate complexity  EVALUATION COMPLEXITY:  Moderate  PLAN:  PT FREQUENCY: 2x/week  PT DURATION: 8 weeks  PLANNED INTERVENTIONS: Therapeutic exercises, Therapeutic activity, Neuromuscular re-education, Balance training, Gait training, Patient/Family education, Self Care, Stair training, Vestibular training, DME instructions, and Re-evaluation  PLAN FOR NEXT SESSION: 10th visit PN! ALF? Pt has standing orthostatic BP! Re-discuss SLP. High velocity movements to mimic pulling cord of generator quickly. Add to HEP for balance. Assess R inattention, gait training w/ vs w/o rollator (pt and daughter's goal to get away from AD), dot drills, blaze pods, boxing, balance w/reduced visual acuity    Rickey Sadowski E Maryanne Huneycutt, PT, DPT 11/17/2022, 11:00 AM

## 2022-11-21 ENCOUNTER — Ambulatory Visit: Payer: Medicare Other | Admitting: Physical Therapy

## 2022-11-21 ENCOUNTER — Encounter: Payer: Self-pay | Admitting: Physical Therapy

## 2022-11-21 VITALS — BP 86/49 | HR 57

## 2022-11-21 DIAGNOSIS — R2681 Unsteadiness on feet: Secondary | ICD-10-CM | POA: Diagnosis not present

## 2022-11-21 DIAGNOSIS — R293 Abnormal posture: Secondary | ICD-10-CM | POA: Diagnosis not present

## 2022-11-21 DIAGNOSIS — R29818 Other symptoms and signs involving the nervous system: Secondary | ICD-10-CM

## 2022-11-21 DIAGNOSIS — R2689 Other abnormalities of gait and mobility: Secondary | ICD-10-CM | POA: Diagnosis not present

## 2022-11-21 DIAGNOSIS — M6281 Muscle weakness (generalized): Secondary | ICD-10-CM | POA: Diagnosis not present

## 2022-11-21 NOTE — Therapy (Signed)
OUTPATIENT PHYSICAL THERAPY NEURO TREATMENT/10th Visit PN!   Patient Name: Seth Carter MRN: [id number] DOB:December 26, 1940, 82 y.o., male Today's Date: 11/21/2022   PCP: [name], MD REFERRING PROVIDER: [name], MD  PT progress note for Seth Carter.  Reporting period 10/16/2022 to 11/21/2022  See Note below for Objective Data and Assessment of Progress/Goals  Thank you for the referral of this patient. Camille Bal, PT, DPT  END OF SESSION:  PT End of Session - 11/21/22 1026     Visit Number 10    Number of Visits 17   16 + eval   Date for PT Re-Evaluation 12/22/22   pushed out due to scheduling   Authorization Type MEDICARE PART A AND B    Progress Note Due on Visit 10    PT Start Time 1015    PT Stop Time 1055    PT Time Calculation (min) 40 min    Equipment Utilized During Treatment Gait belt    Activity Tolerance Other (comment)   orthostatic BP, pt symptomatic (dizzy)   Behavior During Therapy [facility] for tasks assessed/performed                Past Medical History:  Diagnosis Date   Acute pharyngitis    AKI (acute kidney injury) 09/29/2020   Allergic rhinitis    Benign prostatic hyperplasia with lower urinary tract symptoms 09/23/2020   Bradykinesia    Cervical spondylosis without myelopathy 12/18/2019   Chronic back pain    Chronic sinusitis    Degeneration of lumbar intervertebral disc 08/20/2020   ED (erectile dysfunction)    Enlarged prostate 12/15/2020   Essential hypertension    nuclear test in epic 01-16-2020 normal perfusion no ischemia, normal lvf and wall motion, nuclear ef 65%   Family history of hemochromatosis    Fatigue    Gastroesophageal reflux disease without esophagitis 12/18/2019   Generalized weakness    Glaucoma, both eyes    Hardening of the aorta (main artery of the heart)    Hemorrhoids    History of syncope    cardiology evaluation by [name], note in epic 12-26-2019, work-up includes event monitor  02-04-2020( showed SR with occ PAC & PVC rare PAT)/ echo  12-26-2019 (mild LVH, ef 50-55%, moderate MR, mild to moderate AV sclerosis no stenosis, ascending aorta 39mm) and nuclear test 01-16-2020 (normal perfusion w/ normal LV and wall motion, nuclear ef 65%)   Hyperglycemia 12/18/2019   Hypogonadism in male    Leukocytosis 09/29/2020   Macular degeneration, right eye    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   Mixed hyperlipidemia    Onychomycosis 12/18/2019   Osteoarthritis of knee 12/18/2019   Pain in hand 12/18/2019   Parkinson's disease 08/2020   Renal stone 12/18/2019   Sebaceous cyst 12/18/2019   Seizure-like activity 09/29/2020   Spinal stenosis of lumbar region 08/20/2020   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   SHOULDER SURGERY Right 1990's   TONSILLECTOMY  age 68   TRANSURETHRAL RESECTION OF PROSTATE N/A 09/23/2020   Procedure: TRANSURETHRAL RESECTION OF  THE PROSTATE (TURP);  Surgeon: [name], MD;  Location: [facility];  Service: Urology;  Laterality: N/A;  56 MINS   Patient Active Problem List   Diagnosis Date Noted   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   Allergic rhinitis    Chronic sinusitis    Enlarged prostate    Hardening of the aorta (main artery of the heart)    Vitamin B12 deficiency    Seizure-like activity 09/29/2020   Parkinson's disease 09/29/2020   Leukocytosis 09/29/2020   Benign prostatic hyperplasia with lower urinary tract symptoms 09/23/2020   Degeneration of lumbar intervertebral disc 08/20/2020    Spinal stenosis of lumbar region 08/20/2020   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/2019   Hemorrhoids 12/18/2019   Vitamin D deficiency 12/18/2019   Male erectile dysfunction 12/18/2019   Osteoarthritis of knee 12/18/2019   Impaired fasting glucose 12/18/2019   Gastroesophageal reflux disease without esophagitis 12/18/2019   Essential hypertension 12/18/2019   Mixed hyperlipidemia 12/18/2019   Fatigue 12/18/2019   Pain in hand 12/18/2019   Onychomycosis 12/18/2019   Cervical spondylosis without myelopathy 12/18/2019   Toxic effect of venom of wasps 12/18/2019   Right flank pain 12/18/2019   Family history of hemochromatosis 12/18/2019   Pure hypercholesterolemia 12/18/2019   Hyperglycemia 12/18/2019   Shoulder joint pain 01/29/2019    ONSET DATE: 10/11/2022  REFERRING DIAG: I63.81 (ICD-10-CM) - Other cerebral infarction due to occlusion or stenosis of small artery  THERAPY DIAG:  Unsteadiness on feet  Other abnormalities of gait and mobility  Muscle weakness (generalized)  Other symptoms and signs involving the nervous system  Rationale for Evaluation and Treatment: Rehabilitation  SUBJECTIVE:                                                                                                                                                                                             SUBJECTIVE STATEMENT: Pt states he is dizzy at current and that the pollen does not help.  He states his eyes feel irritated.  Pt wishing to continue PT despite BP and symptoms.  Pt accompanied by: family member-dropped off  PERTINENT HISTORY: GERD, HTN, cervical myelopathy, Parkinson's Disease w/ mild neurocognitive disorder (diagnosed 2022), lumbar spinal stenosis, macular degeneration with difficulty with depth perception  From neurology note 09/06/2022: On 08/10/22, his daughter called to report a sudden clinical decline: Began requiring Rollator to walk short distances, had been going a mile or so  by himself without Rollator. He is tired. His BP is high sitting and standing now. I advised he go to Urgent Care today to rule out any infections going  on (& since they have traveled out of town and he experienced this sudden change.)  On 08/21/22, he messaged: "I had sudden onset weakness, light headedness, feeling bad and BP fluctuating very high. Went to local ER and had CT scan which showed possible area of stroke. Transferred to [facility] and admitted. Had MRI, MRA, Heart & carotid arteries ultrasound, along with EKG and extensive bloodwork. MRI didn't show a stroke & no definitive reason for flare up."   Pt is known to clinic from prior episode ending 12/23/2020.  PAIN:  Are you having pain? No-eye irritation, pt did not rate  VITALS (LUE in sitting then standing prior to session onset): Vitals:   11/21/22 1018 11/21/22 1021  BP: 124/60 (!) 86/49  Pulse: (!) 52 (!) 57     PRECAUTIONS: Fall and Other: orthostatic BP  WEIGHT BEARING RESTRICTIONS: No  FALLS: Has patient fallen in last 6 months? Yes. Number of falls 2-fell in the yard last week when stepping in a hole  LIVING ENVIRONMENT: Lives with: lives with their spouse-has one small dog Lives in: House/apartment Stairs: Yes: Internal: 14-16 steps; on left going up and External: 3 steps; on right going up Has following equipment at home: Single point cane, Environmental consultant - 2 wheeled, Environmental consultant - 4 wheeled, Wheelchair (manual), Shower bench, Grab bars, and chair lift and insertable bed rails  PLOF: Independent with community mobility with device, Independent with transfers, Requires assistive device for independence, Needs assistance with ADLs, and supervision for ADLs  PATIENT GOALS: "To get back to where I was before our incident in [place]."  OBJECTIVE:   DIAGNOSTIC FINDINGS:  DAT scan 03/15/2021 IMPRESSION: Tomma Rakers are irregular-shaped with decreased radiotracer activity. Loss of normal activity activity is  greater on the LEFT. Findings are suggestive of Parkinsonian syndrome pathology.  COGNITION: Overall cognitive status: History of cognitive impairments - at baseline   SENSATION: WFL  COORDINATION: LE RAMS:  slow and deliberate Heel-to-shin: WFL bilaterally  EDEMA:  None noted in BLE  POSTURE: rounded shoulders and forward head  LOWER EXTREMITY ROM:     Active  Right Eval Left Eval  Hip flexion Grossly WFL  Hip extension   Hip abduction   Hip adduction   Hip internal rotation   Hip external rotation   Knee flexion   Knee extension   Ankle dorsiflexion   Ankle plantarflexion   Ankle inversion    Ankle eversion     (Blank rows = not tested)  LOWER EXTREMITY MMT:    MMT Right Eval Left Eval  Hip flexion 4+/5 4+/5  Hip extension    Hip abduction    Hip adduction    Hip internal rotation    Hip external rotation    Knee flexion    Knee extension 5/5 5/5  Ankle dorsiflexion 4+/5 4/5  Ankle plantarflexion    Ankle inversion    Ankle eversion    (Blank rows = not tested)  BED MOBILITY:  Sit to supine Modified independence Supine to sit Modified independence *uses bed rail intermittently  TODAY'S TREATMENT:    THERACT: Assessed pt's BP in sitting and standing (see above), pt asymptomatic.  Assessed BP following supine tasks (132/67; HR 52bpm); again in sitting (103/69, HR 54bpm), pt reports feeling less dizzy and more "clear"  THEREX: Supine marching 2x30 seconds w/ cues for increased amplitude of movement Supine bridging 3x10 Supine single leg bridge x6 each LE Supine serratus punch w/ 3lb weight performed unilaterally 2x12; PT palpating providing  scapular facilitation as needed Seated hip lifts up and over 2" > 4" hurdle x10 each side and height Seated trunk rotation to dot target for increased amplitude of movement using 3.3lb wt ball 2x10 each side  PATIENT EDUCATION: Education details:  Continue HEP, monitoring BP as able.  Discussed expectations  of increased BP in supine, but goal of progressing to standing without symptomatic drop in BP so pt can remain functional.  Discussed SLP referral with pt stating he sees his MD at [facility] next week and will ask for this then, he did not wish for the PT to do this today.  He states he is most concerned about his BP.  Pt states he is not ready to consider ALF yet as his wife in also in PT working on fall prevention and recovery so he doesn't have to lift her. Person educated: Patient Education method: Explanation, Demonstration, and Verbal cues Education comprehension: verbalized understanding and needs further education  HOME EXERCISE PROGRAM: Seated PWR moves   Access Code: [email or web address] URL: [email or web address] Date: 10/27/2022 Prepared by: Sherlie Ban  Exercises - Side to Side Weight Shift with Overhead Reach and Counter Support  - 1-2 x daily - 5 x weekly - 2 sets - 10 reps - Step Sideways with Arms Reaching  - 1-2 x daily - 5 x weekly - 2 sets - 10 reps  GOALS: Goals reviewed with patient? Yes  SHORT TERM GOALS: Target date: 11/17/2022  Pt will be independent with initial strength and balance HEP to improve quality of movement. Baseline:  To be established. Goal status: IN PROGRESS  2.  Pt will decrease 5xSTS to </=12 seconds w/o UE support or LE touching chair in order to demonstrate decreased risk for falls and improved functional bilateral LE strength and power. Baseline: 12.54 seconds w/ hands-on-knees and x1 instance of back of legs on chair; 9.75s without UE support  Goal status: MET  3.  to be assessed w/ goal set as appropriate. Baseline: LTG set only due to initial performance. Goal status: REVISED-D/C' (4/2)  4.  Pt will demonstrate increased miniBEST score of >/=20/28 for improved dynamic balance and postural responses to decrease risk of falls. Baseline: 16/28 (4/2); 20/28 (4/26) Goal status: MET  5.  Fall prevention and freezing management  strategies to be reviewed with patient to promote improved safety with functional mobility. Baseline: To be provided. Goal status: IN PROGRESS  LONG TERM GOALS: Target date: 12/15/2022  Patient to be independent with finalized strength and balance HEP to include PWR! If appropriate for patient level in order to promote improved functional mobility. Baseline:  To be established. Goal status: INITIAL  2.  Pt will ambulate >/=1200 feet on to demonstrate improved functional endurance for home and community participation. Baseline: 1019 ft w/ rollator Goal status: INITIAL  3.  Pt will demonstrate increased miniBEST score of >/=24/28 for improved dynamic balance and postural responses to decrease risk of falls. Baseline:  16/28 (4/2) Goal status: INITIAL  4.  Patient will require no more than modA for floor recovery in order to promote safe recovery from falls with caregiver at home. Baseline: Pt needs review due to fluctuation w/ cognition changes. Goal status: INITIAL  ASSESSMENT:  CLINICAL IMPRESSION: Session limited by orthostatic BP and patient presenting more symptomatic at onset of session.  His vitals did improve as expected in supine and he was able to do simple therapeutic exercises to assist in stabilizing BP.  Transitioned to sitting at end of  session w/ ongoing drop in BP, but pt not symptomatic.  PT will continue to monitor and progress standing stability as able.  OBJECTIVE IMPAIRMENTS: Abnormal gait, decreased activity tolerance, decreased balance, decreased cognition, decreased coordination, decreased endurance, difficulty walking, improper body mechanics, postural dysfunction, and orthostatic BP .   ACTIVITY LIMITATIONS: carrying, lifting, bending, squatting, stairs, and locomotion level  PARTICIPATION LIMITATIONS: meal prep, cleaning, laundry, driving, and community activity  PERSONAL FACTORS: Age, Past/current experiences, Time since onset of  injury/illness/exacerbation, Transportation, and 1-2 comorbidities: cervical myelopathy and macular degeneration  are also affecting patient's functional outcome.   REHAB POTENTIAL: Good  CLINICAL DECISION MAKING: Evolving/moderate complexity  EVALUATION COMPLEXITY: Moderate  PLAN:  PT FREQUENCY: 2x/week  PT DURATION: 8 weeks  PLANNED INTERVENTIONS: Therapeutic exercises, Therapeutic activity, Neuromuscular re-education, Balance training, Gait training, Patient/Family education, Self Care, Stair training, Vestibular training, DME instructions, and Re-evaluation  PLAN FOR NEXT SESSION:  Pt has standing orthostatic BP! Did he get referral for SLP from MD at [facility]? High velocity movements to mimic pulling cord of generator quickly. Add to HEP for balance. Assess R inattention, gait training w/ vs w/o rollator (pt and daughter's goal to get away from AD), dot drills, blaze pods, boxing, balance w/reduced visual acuity    Sadie Haber, PT, DPT 11/21/2022, 1:21 PM

## 2022-11-24 ENCOUNTER — Ambulatory Visit: Payer: Medicare Other | Admitting: Physical Therapy

## 2022-11-27 DIAGNOSIS — Z8673 Personal history of transient ischemic attack (TIA), and cerebral infarction without residual deficits: Secondary | ICD-10-CM | POA: Diagnosis not present

## 2022-11-27 DIAGNOSIS — K5901 Slow transit constipation: Secondary | ICD-10-CM | POA: Diagnosis not present

## 2022-11-27 DIAGNOSIS — G903 Multi-system degeneration of the autonomic nervous system: Secondary | ICD-10-CM | POA: Diagnosis not present

## 2022-11-27 DIAGNOSIS — G20A1 Parkinson's disease without dyskinesia, without mention of fluctuations: Secondary | ICD-10-CM | POA: Diagnosis not present

## 2022-11-28 ENCOUNTER — Encounter: Payer: Self-pay | Admitting: Physical Therapy

## 2022-11-28 ENCOUNTER — Ambulatory Visit: Payer: Medicare Other | Attending: Internal Medicine | Admitting: Physical Therapy

## 2022-11-28 VITALS — BP 103/58 | HR 66

## 2022-11-28 DIAGNOSIS — R2689 Other abnormalities of gait and mobility: Secondary | ICD-10-CM | POA: Insufficient documentation

## 2022-11-28 DIAGNOSIS — M6281 Muscle weakness (generalized): Secondary | ICD-10-CM | POA: Diagnosis not present

## 2022-11-28 DIAGNOSIS — R29818 Other symptoms and signs involving the nervous system: Secondary | ICD-10-CM

## 2022-11-28 DIAGNOSIS — R2681 Unsteadiness on feet: Secondary | ICD-10-CM | POA: Insufficient documentation

## 2022-11-28 DIAGNOSIS — R293 Abnormal posture: Secondary | ICD-10-CM | POA: Diagnosis not present

## 2022-11-28 NOTE — Patient Instructions (Signed)
Access Code: [email or web address] URL: [email or web address] Date: 11/28/2022 Prepared by: Camille Bal  Exercises - Side to Side Weight Shift with Overhead Reach and Counter Support  - 1-2 x daily - 5 x weekly - 2 sets - 10 reps - Step Sideways with Arms Reaching  - 1-2 x daily - 5 x weekly - 2 sets - 10 reps - Seated Reaching Across Body  - 1 x daily - 5 x weekly - 2 sets - 10 reps - Seated Reaching to Side  - 1 x daily - 5 x weekly - 2 sets - 10 reps

## 2022-11-28 NOTE — Therapy (Signed)
OUTPATIENT PHYSICAL THERAPY NEURO TREATMENT   Patient Name: Seth Carter MRN: [id number] DOB:Nov 28, 1940, 82 y.o., male Today's Date: 11/28/2022  PCP: [name], MD REFERRING PROVIDER: [name], MD  END OF SESSION:  PT End of Session - 11/28/22 1001     Visit Number 11    Number of Visits 17   16 + eval   Date for PT Re-Evaluation 12/22/22   pushed out due to scheduling   Authorization Type MEDICARE PART A AND B    Progress Note Due on Visit 20    PT Start Time 1001    PT Stop Time 1100    PT Time Calculation (min) 59 min    Equipment Utilized During Treatment Gait belt    Activity Tolerance Patient tolerated treatment well    Behavior During Therapy Flat affect            Past Medical History:  Diagnosis Date   Acute pharyngitis    AKI (acute kidney injury) 09/29/2020   Allergic rhinitis    Benign prostatic hyperplasia with lower urinary tract symptoms 09/23/2020   Bradykinesia    Cervical spondylosis without myelopathy 12/18/2019   Chronic back pain    Chronic sinusitis    Degeneration of lumbar intervertebral disc 08/20/2020   ED (erectile dysfunction)    Enlarged prostate 12/15/2020   Essential hypertension    nuclear test in epic 01-16-2020 normal perfusion no ischemia, normal lvf and wall motion, nuclear ef 65%   Family history of hemochromatosis    Fatigue    Gastroesophageal reflux disease without esophagitis 12/18/2019   Generalized weakness    Glaucoma, both eyes    Hardening of the aorta (main artery of the heart)    Hemorrhoids    History of syncope    cardiology evaluation by [name], note in epic 12-26-2019, work-up includes event monitor 02-04-2020( showed SR with occ PAC & PVC rare PAT)/ echo  12-26-2019 (mild LVH, ef 50-55%, moderate MR, mild to moderate AV sclerosis no stenosis, ascending aorta 39mm) and nuclear test 01-16-2020 (normal perfusion w/ normal LV and wall motion, nuclear ef 65%)   Hyperglycemia 12/18/2019   Hypogonadism  in male    Leukocytosis 09/29/2020   Macular degeneration, right eye    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   Mixed hyperlipidemia    Onychomycosis 12/18/2019   Osteoarthritis of knee 12/18/2019   Pain in hand 12/18/2019   Parkinson's disease 08/2020   Renal stone 12/18/2019   Sebaceous cyst 12/18/2019   Seizure-like activity 09/29/2020   Spinal stenosis of lumbar region 08/20/2020   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   SHOULDER SURGERY Right 1990's   TONSILLECTOMY  age 29   TRANSURETHRAL RESECTION OF PROSTATE N/A 09/23/2020   Procedure: TRANSURETHRAL RESECTION OF THE PROSTATE (TURP);  Surgeon: [name], MD;  Location: [facility];  Service: Urology;  Laterality: N/A;  34 MINS   Patient Active Problem List   Diagnosis Date Noted   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   Allergic rhinitis  Chronic sinusitis    Enlarged prostate    Hardening of the aorta (main artery of the heart)    Vitamin B12 deficiency    Seizure-like activity 09/29/2020   Parkinson's disease 09/29/2020   Leukocytosis 09/29/2020   Benign prostatic hyperplasia with lower urinary tract symptoms 09/23/2020   Degeneration of lumbar intervertebral disc 08/20/2020   Spinal stenosis of lumbar region 08/20/2020   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/2019   Hemorrhoids 12/18/2019   Vitamin D deficiency 12/18/2019   Male erectile dysfunction 12/18/2019   Osteoarthritis of knee 12/18/2019   Impaired fasting glucose 12/18/2019   Gastroesophageal reflux disease without  esophagitis 12/18/2019   Essential hypertension 12/18/2019   Mixed hyperlipidemia 12/18/2019   Fatigue 12/18/2019   Pain in hand 12/18/2019   Onychomycosis 12/18/2019   Cervical spondylosis without myelopathy 12/18/2019   Toxic effect of venom of wasps 12/18/2019   Right flank pain 12/18/2019   Family history of hemochromatosis 12/18/2019   Pure hypercholesterolemia 12/18/2019   Hyperglycemia 12/18/2019   Shoulder joint pain 01/29/2019    ONSET DATE: 10/11/2022  REFERRING DIAG: I63.81 (ICD-10-CM) - Other cerebral infarction due to occlusion or stenosis of small artery  THERAPY DIAG:  Unsteadiness on feet  Other abnormalities of gait and mobility  Muscle weakness (generalized)  Other symptoms and signs involving the nervous system  Abnormal posture  Rationale for Evaluation and Treatment: Rehabilitation  SUBJECTIVE:                                                                                                                                                                                             SUBJECTIVE STATEMENT: Pt ambulates into clinic stating he is dizzy this morning.  He reports he has not been doing his PWR! Or other HEP due to other ongoing activities.  He forgot to ask for ST referral at appt at [facility] yesterday, but further states he does not want that right now because the dizziness is holding up his whole life.  He states he is burdening his daughter who has to take him and his wife everywhere since they cannot drive and he needs to address the "other stuff" first.  Pt accompanied by: family member-dropped off  PERTINENT HISTORY: GERD, HTN, cervical myelopathy, Parkinson's Disease w/ mild neurocognitive disorder (diagnosed 2022), lumbar spinal stenosis, macular degeneration with difficulty with depth perception  From neurology note 09/06/2022: On 08/10/22, his daughter called to report a sudden clinical decline: Began requiring Rollator to walk short  distances, had been going a mile or so by himself without Rollator. He is tired. His BP is high sitting and standing now. I advised he  go to Urgent Care today to rule out any infections going on (& since they have traveled out of town and he experienced this sudden change.)  On 08/21/22, he messaged: "I had sudden onset weakness, light headedness, feeling bad and BP fluctuating very high. Went to local ER and had CT scan which showed possible area of stroke. Transferred to [facility] and admitted. Had MRI, MRA, Heart & carotid arteries ultrasound, along with EKG and extensive bloodwork. MRI didn't show a stroke & no definitive reason for flare up."   Pt is known to clinic from prior episode ending 12/23/2020.  PAIN:  Are you having pain? No  PRECAUTIONS: Fall and Other: orthostatic BP  WEIGHT BEARING RESTRICTIONS: No  FALLS: Has patient fallen in last 6 months? Yes. Number of falls 2-fell in the yard last week when stepping in a hole  LIVING ENVIRONMENT: Lives with: lives with their spouse-has one small dog Lives in: House/apartment Stairs: Yes: Internal: 14-16 steps; on left going up and External: 3 steps; on right going up Has following equipment at home: Single point cane, Environmental consultant - 2 wheeled, Environmental consultant - 4 wheeled, Wheelchair (manual), Shower bench, Grab bars, and chair lift and insertable bed rails  PLOF: Independent with community mobility with device, Independent with transfers, Requires assistive device for independence, Needs assistance with ADLs, and supervision for ADLs  PATIENT GOALS: "To get back to where I was before our incident in [place]."  OBJECTIVE:   DIAGNOSTIC FINDINGS:  DAT scan 03/15/2021 IMPRESSION: Tomma Rakers are irregular-shaped with decreased radiotracer activity. Loss of normal activity activity is greater on the LEFT. Findings are suggestive of Parkinsonian syndrome pathology.  COGNITION: Overall cognitive status: History of cognitive impairments  - at baseline   SENSATION: WFL  COORDINATION: LE RAMS:  slow and deliberate Heel-to-shin: WFL bilaterally  EDEMA:  None noted in BLE  POSTURE: rounded shoulders and forward head  LOWER EXTREMITY ROM:     Active  Right Eval Left Eval  Hip flexion Grossly WFL  Hip extension   Hip abduction   Hip adduction   Hip internal rotation   Hip external rotation   Knee flexion   Knee extension   Ankle dorsiflexion   Ankle plantarflexion   Ankle inversion    Ankle eversion     (Blank rows = not tested)  LOWER EXTREMITY MMT:    MMT Right Eval Left Eval  Hip flexion 4+/5 4+/5  Hip extension    Hip abduction    Hip adduction    Hip internal rotation    Hip external rotation    Knee flexion    Knee extension 5/5 5/5  Ankle dorsiflexion 4+/5 4/5  Ankle plantarflexion    Ankle inversion    Ankle eversion    (Blank rows = not tested)  BED MOBILITY:  Sit to supine Modified independence Supine to sit Modified independence *uses bed rail intermittently  TODAY'S TREATMENT:    THERACT: Assessed BP at onset of session:   Today's Vitals   11/28/22 1008 11/28/22 1009  BP: (!) 156/76 (!) 103/58  Pulse: 61 66   THEREX: SciFit x8 minutes in multi-peaks mode up to level 5.0 w/ BUE/BLE for dynamic cardiovascular warmup and reciprocal mobility, RPE 5-6/10 following task.  NMR: Blaze pods:  3 rounds x 1 minute each w/ 30 sec rest b/w, using crossbody reach in wide stance x2 rounds and unilateral reach outside BOS x1 round w/ CGA no LOB, intermittent cuing for maintaining UE crossbody vs ipsilateral  use and to scan for light; 10, 24, and 13 taps Repeated x3 rounds on blue compliant mat w/ same parameters, he remains faster when using ipsilateral reach; 15, 27, and 19 taps Standing on blue mat surface: wide stance eyes open x30 seconds unsupported > eyes closed wide stance unsupported x30 sec > eyes closed wide stance unsupported w/ head rotation x30 sec > narrow stance eyes open  unsupported x30 sec > narrow stance eyes closed unsupported x30 sec > narrow stance eyes closed unsupported head rotation x30 sec > narrow semi-tandem unsupported alt LE in rear x30 sec each > attempted to progress to eyes closed for each semi-tandem position w/ only few sec achieved each prior to LOB laterally Seated crossbody reach x10 each side w/ return demo; added to HEP Seated ipsilateral reach outside BOS x10 each side; added to HEP  PATIENT EDUCATION: Education details:  Ongoing discussion of benefit of ST for patient.  Offered placing referral request w/ pt declining.  Further discussed using abdominal binder and compression stockings for BP management w/ pt stating his daughter said this was " a flop" but he cannot recall why.  PT encouraged to try them again  or bring to PT session for donning practice.  Please resume HEP (edu on benefits of PWR! For PD and general exercise for BP regulation), monitoring BP as able.   Person educated: Patient-spoke to daughter in lobby about bringing compression stockings and abdominal binder to next session. Education method: Explanation, Demonstration, and Verbal cues Education comprehension: verbalized understanding and needs further education  HOME EXERCISE PROGRAM: Seated PWR moves   Access Code: [email or web address] URL: [email or web address] Date: 11/28/2022 Prepared by: Camille Bal  Exercises - Side to Side Weight Shift with Overhead Reach and Counter Support  - 1-2 x daily - 5 x weekly - 2 sets - 10 reps - Step Sideways with Arms Reaching  - 1-2 x daily - 5 x weekly - 2 sets - 10 reps - Seated Reaching Across Body  - 1 x daily - 5 x weekly - 2 sets - 10 reps - Seated Reaching to Side  - 1 x daily - 5 x weekly - 2 sets - 10 reps  GOALS: Goals reviewed with patient? Yes  SHORT TERM GOALS: Target date: 11/17/2022  Pt will be independent with initial strength and balance HEP to improve quality of movement. Baseline:  To be  established. Goal status: IN PROGRESS  2.  Pt will decrease 5xSTS to </=12 seconds w/o UE support or LE touching chair in order to demonstrate decreased risk for falls and improved functional bilateral LE strength and power. Baseline: 12.54 seconds w/ hands-on-knees and x1 instance of back of legs on chair; 9.75s without UE support  Goal status: MET  3.  to be assessed w/ goal set as appropriate. Baseline: LTG set only due to initial performance. Goal status: REVISED-D/C' (4/2)  4.  Pt will demonstrate increased miniBEST score of >/=20/28 for improved dynamic balance and postural responses to decrease risk of falls. Baseline: 16/28 (4/2); 20/28 (4/26) Goal status: MET  5.  Fall prevention and freezing management strategies to be reviewed with patient to promote improved safety with functional mobility. Baseline: To be provided. Goal status: IN PROGRESS  LONG TERM GOALS: Target date: 12/15/2022  Patient to be independent with finalized strength and balance HEP to include PWR! If appropriate for patient level in order to promote improved functional mobility. Baseline:  To be established. Goal status: INITIAL  2.  Pt will ambulate >/=1200 feet on to demonstrate improved functional endurance for home and community participation. Baseline: 1019 ft w/ rollator Goal status: INITIAL  3.  Pt will demonstrate increased miniBEST score of >/=24/28 for improved dynamic balance and postural responses to decrease risk of falls. Baseline:  16/28 (4/2) Goal status: INITIAL  4.  Patient will require no more than modA for floor recovery in order to promote safe recovery from falls with caregiver at home. Baseline: Pt needs review due to fluctuation w/ cognition changes. Goal status: INITIAL  ASSESSMENT:  CLINICAL IMPRESSION: Focus of skilled session today on continuing to address BP management and dynamic balance with large amplitude movements.  He is at current not compliant to HEP due  to being busy outside clinic with personal matters.  He was encouraged to bring newly obtained abdominal binder and compression stockings to clinic for further trial and assessment of benefit as he is still having large swings in BP with positional transitions and remains limited by dizziness.  He does well with weight shifting and NMR tasks this session but needs moderate cuing to maintain patterns during blaze pod tasks as well as for improved scanning.  He seems to be challenged by dual tasking.  Will continue to address these deficits in upcoming sessions as able.  OBJECTIVE IMPAIRMENTS: Abnormal gait, decreased activity tolerance, decreased balance, decreased cognition, decreased coordination, decreased endurance, difficulty walking, improper body mechanics, postural dysfunction, and orthostatic BP .   ACTIVITY LIMITATIONS: carrying, lifting, bending, squatting, stairs, and locomotion level  PARTICIPATION LIMITATIONS: meal prep, cleaning, laundry, driving, and community activity  PERSONAL FACTORS: Age, Past/current experiences, Time since onset of injury/illness/exacerbation, Transportation, and 1-2 comorbidities: cervical myelopathy and macular degeneration  are also affecting patient's functional outcome.   REHAB POTENTIAL: Good  CLINICAL DECISION MAKING: Evolving/moderate complexity  EVALUATION COMPLEXITY: Moderate  PLAN:  PT FREQUENCY: 2x/week  PT DURATION: 8 weeks  PLANNED INTERVENTIONS: Therapeutic exercises, Therapeutic activity, Neuromuscular re-education, Balance training, Gait training, Patient/Family education, Self Care, Stair training, Vestibular training, DME instructions, and Re-evaluation  PLAN FOR NEXT SESSION:  Pt has standing orthostatic BP-did they bring abdominal binder and compression stockings to practice donning/assess if it's helping!  REVIEW/UPDATE HEP.   Add to HEP for balance. Assess R inattention, gait training w/ vs w/o rollator (pt and daughter's goal to  get away from AD), dot drills, boxing, balance w/reduced visual acuity, treadmill training w/ ball kicks, dual tasking   Sadie Haber, PT, DPT 11/28/2022, 11:01 AM

## 2022-12-01 ENCOUNTER — Ambulatory Visit: Payer: Medicare Other | Admitting: Physical Therapy

## 2022-12-01 VITALS — BP 98/53 | HR 63

## 2022-12-01 DIAGNOSIS — R293 Abnormal posture: Secondary | ICD-10-CM | POA: Diagnosis not present

## 2022-12-01 DIAGNOSIS — R2681 Unsteadiness on feet: Secondary | ICD-10-CM | POA: Diagnosis not present

## 2022-12-01 DIAGNOSIS — R29818 Other symptoms and signs involving the nervous system: Secondary | ICD-10-CM | POA: Diagnosis not present

## 2022-12-01 DIAGNOSIS — R2689 Other abnormalities of gait and mobility: Secondary | ICD-10-CM

## 2022-12-01 DIAGNOSIS — M6281 Muscle weakness (generalized): Secondary | ICD-10-CM | POA: Diagnosis not present

## 2022-12-01 NOTE — Therapy (Signed)
OUTPATIENT PHYSICAL THERAPY NEURO TREATMENT   Patient Name: Seth Carter MRN: [id number] DOB:Sep 27, 1940, 82 y.o., male Today's Date: 12/01/2022  PCP: [name], MD REFERRING PROVIDER: [name], MD  END OF SESSION:  PT End of Session - 12/01/22 1024     Visit Number 12    Number of Visits 17   16 + eval   Date for PT Re-Evaluation 12/22/22   pushed out due to scheduling   Authorization Type MEDICARE PART A AND B    Progress Note Due on Visit 20    PT Start Time 1016    PT Stop Time 1059    PT Time Calculation (min) 43 min    Equipment Utilized During Treatment Gait belt    Activity Tolerance Patient tolerated treatment well    Behavior During Therapy Flat affect             Past Medical History:  Diagnosis Date   Acute pharyngitis    AKI (acute kidney injury) 09/29/2020   Allergic rhinitis    Benign prostatic hyperplasia with lower urinary tract symptoms 09/23/2020   Bradykinesia    Cervical spondylosis without myelopathy 12/18/2019   Chronic back pain    Chronic sinusitis    Degeneration of lumbar intervertebral disc 08/20/2020   ED (erectile dysfunction)    Enlarged prostate 12/15/2020   Essential hypertension    nuclear test in epic 01-16-2020 normal perfusion no ischemia, normal lvf and wall motion, nuclear ef 65%   Family history of hemochromatosis    Fatigue    Gastroesophageal reflux disease without esophagitis 12/18/2019   Generalized weakness    Glaucoma, both eyes    Hardening of the aorta (main artery of the heart)    Hemorrhoids    History of syncope    cardiology evaluation by [name], note in epic 12-26-2019, work-up includes event monitor 02-04-2020( showed SR with occ PAC & PVC rare PAT)/ echo  12-26-2019 (mild LVH, ef 50-55%, moderate MR, mild to moderate AV sclerosis no stenosis, ascending aorta 39mm) and nuclear test 01-16-2020 (normal perfusion w/ normal LV and wall motion, nuclear ef 65%)   Hyperglycemia 12/18/2019    Hypogonadism in male    Leukocytosis 09/29/2020   Macular degeneration, right eye    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   Mixed hyperlipidemia    Onychomycosis 12/18/2019   Osteoarthritis of knee 12/18/2019   Pain in hand 12/18/2019   Parkinson's disease 08/2020   Renal stone 12/18/2019   Sebaceous cyst 12/18/2019   Seizure-like activity 09/29/2020   Spinal stenosis of lumbar region 08/20/2020   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   SHOULDER SURGERY Right 1990's   TONSILLECTOMY  age 88   TRANSURETHRAL RESECTION OF PROSTATE N/A 09/23/2020   Procedure: TRANSURETHRAL RESECTION OF THE PROSTATE (TURP);  Surgeon: [name], MD;  Location: [facility];  Service: Urology;  Laterality: N/A;  32 MINS   Patient Active Problem List   Diagnosis Date Noted   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   Allergic rhinitis  Chronic sinusitis    Enlarged prostate    Hardening of the aorta (main artery of the heart)    Vitamin B12 deficiency    Seizure-like activity 09/29/2020   Parkinson's disease 09/29/2020   Leukocytosis 09/29/2020   Benign prostatic hyperplasia with lower urinary tract symptoms 09/23/2020   Degeneration of lumbar intervertebral disc 08/20/2020   Spinal stenosis of lumbar region 08/20/2020   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/2019   Hemorrhoids 12/18/2019   Vitamin D deficiency 12/18/2019   Male erectile dysfunction 12/18/2019   Osteoarthritis of knee 12/18/2019   Impaired fasting glucose 12/18/2019   Gastroesophageal reflux  disease without esophagitis 12/18/2019   Essential hypertension 12/18/2019   Mixed hyperlipidemia 12/18/2019   Fatigue 12/18/2019   Pain in hand 12/18/2019   Onychomycosis 12/18/2019   Cervical spondylosis without myelopathy 12/18/2019   Toxic effect of venom of wasps 12/18/2019   Right flank pain 12/18/2019   Family history of hemochromatosis 12/18/2019   Pure hypercholesterolemia 12/18/2019   Hyperglycemia 12/18/2019   Shoulder joint pain 01/29/2019    ONSET DATE: 10/11/2022  REFERRING DIAG: I63.81 (ICD-10-CM) - Other cerebral infarction due to occlusion or stenosis of small artery  THERAPY DIAG:  Unsteadiness on feet  Other abnormalities of gait and mobility  Rationale for Evaluation and Treatment: Rehabilitation  SUBJECTIVE:                                                                                                                                                                                             SUBJECTIVE STATEMENT: Pt ambulates into clinic stating he is dizzy this morning.  Saw Dr. Fidela Juneau and had a few medications adjusted. No falls   Pt accompanied by: family member-Daughter in Lobby   PERTINENT HISTORY: GERD, HTN, cervical myelopathy, Parkinson's Disease w/ mild neurocognitive disorder (diagnosed 2022), lumbar spinal stenosis, macular degeneration with difficulty with depth perception  From neurology note 09/06/2022: On 08/10/22, his daughter called to report a sudden clinical decline: Began requiring Rollator to walk short distances, had been going a mile or so by himself without Rollator. He is tired. His BP is high sitting and standing now. I advised he go to Urgent Care today to rule out any infections going on (& since they have traveled out of town and he experienced this sudden change.)  On 08/21/22, he messaged: "I had sudden onset weakness, light headedness, feeling bad and BP fluctuating very high. Went to local ER and had CT scan which showed  possible area of stroke. Transferred to [facility] and admitted. Had MRI, MRA, Heart & carotid arteries ultrasound, along with EKG and extensive bloodwork.  MRI didn't show a stroke & no definitive reason for flare up."   Pt is known to clinic from prior episode ending 12/23/2020.  PAIN:  Are you having pain? No  PRECAUTIONS: Fall and Other: orthostatic BP  WEIGHT BEARING RESTRICTIONS: No  FALLS: Has patient fallen in last 6 months? Yes. Number of falls 2-fell in the yard last week when stepping in a hole  LIVING ENVIRONMENT: Lives with: lives with their spouse-has one small dog Lives in: House/apartment Stairs: Yes: Internal: 14-16 steps; on left going up and External: 3 steps; on right going up Has following equipment at home: Single point cane, Environmental consultant - 2 wheeled, Environmental consultant - 4 wheeled, Wheelchair (manual), Shower bench, Grab bars, and chair lift and insertable bed rails  PLOF: Independent with community mobility with device, Independent with transfers, Requires assistive device for independence, Needs assistance with ADLs, and supervision for ADLs  PATIENT GOALS: "To get back to where I was before our incident in [place]."  OBJECTIVE:   DIAGNOSTIC FINDINGS:  DAT scan 03/15/2021 IMPRESSION: Tomma Rakers are irregular-shaped with decreased radiotracer activity. Loss of normal activity activity is greater on the LEFT. Findings are suggestive of Parkinsonian syndrome pathology.  COGNITION: Overall cognitive status: History of cognitive impairments - at baseline   SENSATION: WFL  COORDINATION: LE RAMS:  slow and deliberate Heel-to-shin: WFL bilaterally  EDEMA:  None noted in BLE  POSTURE: rounded shoulders and forward head  LOWER EXTREMITY ROM:     Active  Right Eval Left Eval  Hip flexion Grossly WFL  Hip extension   Hip abduction   Hip adduction   Hip internal rotation   Hip external rotation   Knee flexion   Knee extension   Ankle dorsiflexion    Ankle plantarflexion   Ankle inversion    Ankle eversion     (Blank rows = not tested)  LOWER EXTREMITY MMT:    MMT Right Eval Left Eval  Hip flexion 4+/5 4+/5  Hip extension    Hip abduction    Hip adduction    Hip internal rotation    Hip external rotation    Knee flexion    Knee extension 5/5 5/5  Ankle dorsiflexion 4+/5 4/5  Ankle plantarflexion    Ankle inversion    Ankle eversion    (Blank rows = not tested)  BED MOBILITY:  Sit to supine Modified independence Supine to sit Modified independence *uses bed rail intermittently  TODAY'S TREATMENT:    THERACT: Had conversation w/pt's daughter in lobby at beginning of session regarding pt's living situation and progress in therapy. Daughter reports she could not find pt's abdominal binder but pt did bring compression socks to session. Pt saw Dr. Fidela Juneau a few days ago and had 3 medications changed in hopes of elevating BP. Daughter states she has tried to talked to pt about either moving into ALF or having daughter move houses and build a Engineer, agricultural for pt and wife, but pt not agreeable to either at this time. Daughter very supportive of whatever pt is wanting to do and provided encouragement to therapist to educate pt on this. Daughter also reports pt is depressed and refused medication for this but will continue to encourage pt to start this. Informed daughter that pt will need to continue to use rollator for safety, daughter verbalized understanding.  Assessed BP at onset of session in seated and standing positions of LUE. Pt reports increased dizziness today:   Today's Vitals   12/01/22 1027 12/01/22 1032  BP: 115/68 (!) 98/53  Pulse: (!) 59 63    THEREX: SciFit multi-peaks level 9 for 10 minutes using BUE/BLEs for neural priming for reciprocal movement, dynamic cardiovascular conditioning and increased amplitude of stepping. RPE of 5-6/10 and BP of 127/58 mmHg. following activity.   NMR  In // bars for improved  reaching of of BOS, facilitation of ankle strategy, single leg stance and LE coordination: Ipsilateral cone grab w/cross-body rotation and stack to various heights, x8 cones per side w/CGA and no UE support. Pt more challenged finding cones on L side vs R.  Progressed to performing same activity while standing on airex for added balance challenge and pt required intermittent UE support for stabilization, especially when rotating to R side. No major LOB noted, CGA for safety  On airex, alt double cone taps, x12 per side w/BUE support > single UE support. Pt initially required max multimodal cues (pt does better w/visual) for proper sequencing but was able to maintain without cues after 6 reps. Noted decreased step clearance w/RLE and pt frequently knocking cones over w/RLE > LLE. SBA for safety.    PATIENT EDUCATION: Education details:  Continue to monitor BP at home, plan for next session (pt wants to box)  Person educated: Patient-spoke to daughter in lobby about housing situation and realistic expectations in therapy  Education method: Explanation, Demonstration, and Verbal cues Education comprehension: verbalized understanding and needs further education  HOME EXERCISE PROGRAM: Seated PWR moves   Access Code: [email or web address] URL: [email or web address] Date: 11/28/2022 Prepared by: Camille Bal  Exercises - Side to Side Weight Shift with Overhead Reach and Counter Support  - 1-2 x daily - 5 x weekly - 2 sets - 10 reps - Step Sideways with Arms Reaching  - 1-2 x daily - 5 x weekly - 2 sets - 10 reps - Seated Reaching Across Body  - 1 x daily - 5 x weekly - 2 sets - 10 reps - Seated Reaching to Side  - 1 x daily - 5 x weekly - 2 sets - 10 reps  GOALS: Goals reviewed with patient? Yes  SHORT TERM GOALS: Target date: 11/17/2022  Pt will be independent with initial strength and balance HEP to improve quality of movement. Baseline:  To be established. Goal status: IN  PROGRESS  2.  Pt will decrease 5xSTS to </=12 seconds w/o UE support or LE touching chair in order to demonstrate decreased risk for falls and improved functional bilateral LE strength and power. Baseline: 12.54 seconds w/ hands-on-knees and x1 instance of back of legs on chair; 9.75s without UE support  Goal status: MET  3.  to be assessed w/ goal set as appropriate. Baseline: LTG set only due to initial performance. Goal status: REVISED-D/C' (4/2)  4.  Pt will demonstrate increased miniBEST score of >/=20/28 for improved dynamic balance and postural responses to decrease risk of falls. Baseline: 16/28 (4/2); 20/28 (4/26) Goal status: MET  5.  Fall prevention and freezing management strategies to be reviewed with patient to promote improved safety with functional mobility. Baseline: To be provided. Goal status: IN PROGRESS  LONG TERM GOALS: Target date: 12/15/2022  Patient to be independent with finalized strength and balance HEP to include PWR! If appropriate for patient level in order to promote improved functional mobility. Baseline:  To be established. Goal status: INITIAL  2.  Pt will ambulate >/=1200 feet on to demonstrate improved functional endurance for home and community participation. Baseline: 1019 ft w/ rollator Goal  status: INITIAL  3.  Pt will demonstrate increased miniBEST score of >/=24/28 for improved dynamic balance and postural responses to decrease risk of falls. Baseline:  16/28 (4/2) Goal status: INITIAL  4.  Patient will require no more than modA for floor recovery in order to promote safe recovery from falls with caregiver at home. Baseline: Pt needs review due to fluctuation w/ cognition changes. Goal status: INITIAL  ASSESSMENT:  CLINICAL IMPRESSION: Emphasis of skilled PT session on family education regarding progress in PT, reaching out of BOS and endurance. Spoke to pt's daughter during beginning of session in lobby regarding pt's housing  situation and safety/mental health concerns. Pt's daughter in agreement and is supportive of whatever will best suit pt, but pt not receptive to this conversation at this time. Pt unable to find abdominal binder at home but did bring compression socks in basket of rollator. Did not Joshu this date due to time constraints. Pt did have 3 medications changed earlier this week in hopes of elevating his BP, but pt still reporting symptoms of orthostatics this date. Will continue to monitor Pt disappointed over therapist's recommendation to use rollator at all times but verbalized understanding. Pt would like to work on higher amplitude activities, so will plan for this next session. Continue POC.   OBJECTIVE IMPAIRMENTS: Abnormal gait, decreased activity tolerance, decreased balance, decreased cognition, decreased coordination, decreased endurance, difficulty walking, improper body mechanics, postural dysfunction, and orthostatic BP .   ACTIVITY LIMITATIONS: carrying, lifting, bending, squatting, stairs, and locomotion level  PARTICIPATION LIMITATIONS: meal prep, cleaning, laundry, driving, and community activity  PERSONAL FACTORS: Age, Past/current experiences, Time since onset of injury/illness/exacerbation, Transportation, and 1-2 comorbidities: cervical myelopathy and macular degeneration  are also affecting patient's functional outcome.   REHAB POTENTIAL: Good  CLINICAL DECISION MAKING: Evolving/moderate complexity  EVALUATION COMPLEXITY: Moderate  PLAN:  PT FREQUENCY: 2x/week  PT DURATION: 8 weeks  PLANNED INTERVENTIONS: Therapeutic exercises, Therapeutic activity, Neuromuscular re-education, Balance training, Gait training, Patient/Family education, Self Care, Stair training, Vestibular training, DME instructions, and Re-evaluation  PLAN FOR NEXT SESSION:  Pt wants to box. Pt has standing orthostatic BP-did they bring abdominal binder and compression stockings to practice donning/assess if  it's helping! Pt did have meds changed on 5/7.  REVIEW/UPDATE HEP.   Add to HEP for balance. Assess R inattention, gait training w/ vs w/o rollator (pt and daughter's goal to get away from AD), dot drills, boxing, balance w/reduced visual acuity, treadmill training w/ ball kicks, dual tasking   Laurie Penado E Gurbani Figge, PT, DPT 12/01/2022, 11:00 AM

## 2022-12-05 ENCOUNTER — Ambulatory Visit: Payer: Medicare Other | Admitting: Physical Therapy

## 2022-12-08 ENCOUNTER — Ambulatory Visit: Payer: Medicare Other | Admitting: Physical Therapy

## 2022-12-08 VITALS — BP 109/62 | HR 59

## 2022-12-08 DIAGNOSIS — R2681 Unsteadiness on feet: Secondary | ICD-10-CM | POA: Diagnosis not present

## 2022-12-08 DIAGNOSIS — R29818 Other symptoms and signs involving the nervous system: Secondary | ICD-10-CM | POA: Diagnosis not present

## 2022-12-08 DIAGNOSIS — R2689 Other abnormalities of gait and mobility: Secondary | ICD-10-CM

## 2022-12-08 DIAGNOSIS — R293 Abnormal posture: Secondary | ICD-10-CM | POA: Diagnosis not present

## 2022-12-08 DIAGNOSIS — M6281 Muscle weakness (generalized): Secondary | ICD-10-CM

## 2022-12-08 NOTE — Therapy (Addendum)
OUTPATIENT PHYSICAL THERAPY NEURO TREATMENT   Patient Name: Seth Carter MRN: [id number] DOB:Dec 08, 1940, 82 y.o., male Today's Date: 12/08/2022  PCP: [name], MD REFERRING PROVIDER: [name], MD  END OF SESSION:  PT End of Session - 12/08/22 1017     Visit Number 13    Number of Visits 17   16 + eval   Date for PT Re-Evaluation 12/22/22   pushed out due to scheduling   Authorization Type MEDICARE PART A AND B    Progress Note Due on Visit 20    PT Start Time 1017    PT Stop Time 1101    PT Time Calculation (min) 44 min    Equipment Utilized During Treatment Gait belt    Activity Tolerance Patient tolerated treatment well    Behavior During Therapy WFL for tasks assessed/performed              Past Medical History:  Diagnosis Date   Acute pharyngitis    AKI (acute kidney injury) 09/29/2020   Allergic rhinitis    Benign prostatic hyperplasia with lower urinary tract symptoms 09/23/2020   Bradykinesia    Cervical spondylosis without myelopathy 12/18/2019   Chronic back pain    Chronic sinusitis    Degeneration of lumbar intervertebral disc 08/20/2020   ED (erectile dysfunction)    Enlarged prostate 12/15/2020   Essential hypertension    nuclear test in epic 01-16-2020 normal perfusion no ischemia, normal lvf and wall motion, nuclear ef 65%   Family history of hemochromatosis    Fatigue    Gastroesophageal reflux disease without esophagitis 12/18/2019   Generalized weakness    Glaucoma, both eyes    Hardening of the aorta (main artery of the heart)    Hemorrhoids    History of syncope    cardiology evaluation by [name], note in epic 12-26-2019, work-up includes event monitor 02-04-2020( showed SR with occ PAC & PVC rare PAT)/ echo  12-26-2019 (mild LVH, ef 50-55%, moderate MR, mild to moderate AV sclerosis no stenosis, ascending aorta 39mm) and nuclear test 01-16-2020 (normal perfusion w/ normal LV and wall motion, nuclear ef 65%)   Hyperglycemia  12/18/2019   Hypogonadism in male    Leukocytosis 09/29/2020   Macular degeneration, right eye    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   Mixed hyperlipidemia    Onychomycosis 12/18/2019   Osteoarthritis of knee 12/18/2019   Pain in hand 12/18/2019   Parkinson's disease 08/2020   Renal stone 12/18/2019   Sebaceous cyst 12/18/2019   Seizure-like activity 09/29/2020   Spinal stenosis of lumbar region 08/20/2020   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   SHOULDER SURGERY Right 1990's   TONSILLECTOMY  age 50   TRANSURETHRAL RESECTION OF PROSTATE N/A 09/23/2020   Procedure: TRANSURETHRAL RESECTION OF THE PROSTATE (TURP);  Surgeon: [name], MD;  Location: [facility];  Service: Urology;  Laterality: N/A;  62 MINS   Patient Active Problem List   Diagnosis Date Noted   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  Allergic rhinitis    Chronic sinusitis    Enlarged prostate    Hardening of the aorta (main artery of the heart)    Vitamin B12 deficiency    Seizure-like activity 09/29/2020   Parkinson's disease 09/29/2020   Leukocytosis 09/29/2020   Benign prostatic hyperplasia with lower urinary tract symptoms 09/23/2020   Degeneration of lumbar intervertebral disc 08/20/2020   Spinal stenosis of lumbar region 08/20/2020   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/2019   Hemorrhoids 12/18/2019   Vitamin D deficiency 12/18/2019   Male erectile dysfunction 12/18/2019   Osteoarthritis of knee 12/18/2019   Impaired fasting glucose 12/18/2019   Gastroesophageal  reflux disease without esophagitis 12/18/2019   Essential hypertension 12/18/2019   Mixed hyperlipidemia 12/18/2019   Fatigue 12/18/2019   Pain in hand 12/18/2019   Onychomycosis 12/18/2019   Cervical spondylosis without myelopathy 12/18/2019   Toxic effect of venom of wasps 12/18/2019   Right flank pain 12/18/2019   Family history of hemochromatosis 12/18/2019   Pure hypercholesterolemia 12/18/2019   Hyperglycemia 12/18/2019   Shoulder joint pain 01/29/2019    ONSET DATE: 10/11/2022  REFERRING DIAG: I63.81 (ICD-10-CM) - 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 No dizziness this morning.   Pt accompanied by: family member-Daughter in Lobby   PERTINENT HISTORY: GERD, HTN, cervical myelopathy, Parkinson's Disease w/ mild neurocognitive disorder (diagnosed 2022), lumbar spinal stenosis, macular degeneration with difficulty with depth perception  From neurology note 09/06/2022: On 08/10/22, his daughter called to report a sudden clinical decline: Began requiring Rollator to walk short distances, had been going a mile or so by himself without Rollator. He is tired. His BP is high sitting and standing now. I advised he go to Urgent Care today to rule out any infections going on (& since they have traveled out of town and he experienced this sudden change.)  On 08/21/22, he messaged: "I had sudden onset weakness, light headedness, feeling bad and BP fluctuating very high. Went to local ER and had  CT scan which showed possible area of stroke. Transferred to [facility] and admitted. Had MRI, MRA, Heart & carotid  arteries ultrasound, along with EKG and extensive bloodwork. MRI didn't show a stroke & no definitive reason for flare up."   Pt is known to clinic from prior episode ending 12/23/2020.  PAIN:  Are you having pain? No  PRECAUTIONS: Fall and Other: orthostatic BP  WEIGHT BEARING RESTRICTIONS: No  FALLS: Has patient fallen in last 6 months? Yes. Number of falls 2-fell in the yard last week when stepping in a hole  LIVING ENVIRONMENT: Lives with: lives with their spouse-has one small dog Lives in: House/apartment Stairs: Yes: Internal: 14-16 steps; on left going up and External: 3 steps; on right going up Has following equipment at home: Single point cane, Environmental consultant - 2 wheeled, Environmental consultant - 4 wheeled, Wheelchair (manual), Shower bench, Grab bars, and chair lift and insertable bed rails  PLOF: Independent with community mobility with device, Independent with transfers, Requires assistive device for independence, Needs assistance with ADLs, and supervision for ADLs  PATIENT GOALS: "To get back to where I was before our incident in [place]."  OBJECTIVE:   DIAGNOSTIC FINDINGS:  DAT scan 03/15/2021 IMPRESSION: Tomma Rakers are irregular-shaped with decreased radiotracer activity. Loss of normal activity activity is greater on the LEFT. Findings are suggestive of Parkinsonian syndrome pathology.  COGNITION: Overall cognitive status: History of cognitive impairments - at baseline   SENSATION: WFL  COORDINATION: LE RAMS:  slow and deliberate Heel-to-shin: WFL bilaterally  EDEMA:  None noted in BLE  POSTURE: rounded shoulders and forward head  LOWER EXTREMITY ROM:     Active  Right Eval Left Eval  Hip flexion Grossly WFL  Hip extension   Hip abduction   Hip adduction   Hip internal rotation   Hip external rotation   Knee flexion   Knee extension    Ankle dorsiflexion   Ankle plantarflexion   Ankle inversion    Ankle eversion     (Blank rows = not tested)  LOWER EXTREMITY MMT:    MMT Right Eval Left Eval  Hip flexion 4+/5 4+/5  Hip extension    Hip abduction    Hip adduction    Hip internal rotation    Hip external rotation    Knee flexion    Knee extension 5/5 5/5  Ankle dorsiflexion 4+/5 4/5  Ankle plantarflexion    Ankle inversion    Ankle eversion    (Blank rows = not tested)  BED MOBILITY:  Sit to supine Modified independence Supine to sit Modified independence *uses bed rail intermittently  TODAY'S TREATMENT:    THERACT: Assessed BP at onset of session in seated and standing positions of LUE.  Today's Vitals   12/08/22 1023 12/08/22 1025  BP: (!) 161/77 109/62  Pulse: (!) 56 (!) 59     NMR  Boxing w/second therapist for improved reciprocal coordination, high amplitude movement, anticipatory balance strategies and determination of safety to return to [facility]:  Alt fwd step w/ipsilateral punch, x15 per side, using two targets. Max cues to perform contralateral punch, but pt unable to coordinate. CGA throughout and min verbal cues to reduce anterior migration of pt towards targets  Progressed to alt fwd step w/ipsilateral punch and contralateral jab, x15 per side. Pt continued to have difficulty sequencing this but did better w/single target to punch towards. No instability noted, CGA throughout  Slam ball throws to colored dot targets using 10# med ball, x12 reps w/SBA. Pt able to retrieve ball from ground after each throw without issue. Pt performed well w/no instability or reports of  dizziness.  15# KB wide stance deadlift progression:  x10 b/l deadlift - x 8 each side u/l deadlift. Mod verbal cues for proper form when holding KB on L side  - x 10 each side alternating u/l deadlift, progressing to add contralateral UE flick. Pt did well w/concurrent visual cues to properly perform on L side, as  pt placing KB to lateral aspect of L foot rather than medial. No difficulty performing on R   Ther Ex  SciFit ramp up level 9 for 8 minutes using BLEs only for dynamic cardiovascular conditioning and increased amplitude of stepping. RPE of 6/10 following activity   Gait pattern: step through pattern, decreased stride length, and trunk flexed Distance walked: Various clinic distances  Assistive device utilized: Environmental consultant - 4 wheeled Level of assistance: SBA Comments: min cues for upright posture and to maintain close distance to AD w/turns     PATIENT EDUCATION: Education details:  Plan to contact [facility] this week and inquire about returning, printed updated appointment calendar for pt and gave to daughter  Person educated: Patient-spoke to daughter in lobby about housing situation and realistic expectations in therapy  Education method: Explanation, Facilities manager, Verbal cues, and Handouts Education comprehension: verbalized understanding and needs further education  HOME EXERCISE PROGRAM: Seated PWR moves   Access Code: [email or web address] URL: [email or web address] Date: 11/28/2022 Prepared by: Camille Bal  Exercises - Side to Side Weight Shift with Overhead Reach and Counter Support  - 1-2 x daily - 5 x weekly - 2 sets - 10 reps - Step Sideways with Arms Reaching  - 1-2 x daily - 5 x weekly - 2 sets - 10 reps - Seated Reaching Across Body  - 1 x daily - 5 x weekly - 2 sets - 10 reps - Seated Reaching to Side  - 1 x daily - 5 x weekly - 2 sets - 10 reps  GOALS: Goals reviewed with patient? Yes  SHORT TERM GOALS: Target date: 11/17/2022  Pt will be independent with initial strength and balance HEP to improve quality of movement. Baseline:  To be established. Goal status: IN PROGRESS  2.  Pt will decrease 5xSTS to </=12 seconds w/o UE support or LE touching chair in order to demonstrate decreased risk for falls and improved functional bilateral LE strength  and power. Baseline: 12.54 seconds w/ hands-on-knees and x1 instance of back of legs on chair; 9.75s without UE support  Goal status: MET  3.  to be assessed w/ goal set as appropriate. Baseline: LTG set only due to initial performance. Goal status: REVISED-D/C' (4/2)  4.  Pt will demonstrate increased miniBEST score of >/=20/28 for improved dynamic balance and postural responses to decrease risk of falls. Baseline: 16/28 (4/2); 20/28 (4/26) Goal status: MET  5.  Fall prevention and freezing management strategies to be reviewed with patient to promote improved safety with functional mobility. Baseline: To be provided. Goal status: IN PROGRESS  LONG TERM GOALS: Target date: 12/15/2022  Patient to be independent with finalized strength and balance HEP to include PWR! If appropriate for patient level in order to promote improved functional mobility. Baseline:  To be established. Goal status: INITIAL  2.  Pt will ambulate >/=1200 feet on to demonstrate improved functional endurance for home and community participation. Baseline: 1019 ft w/ rollator Goal status: INITIAL  3.  Pt will demonstrate increased miniBEST score of >/=24/28 for improved dynamic balance and postural responses to decrease risk of falls. Baseline:  16/28 (4/2)  Goal status: INITIAL  4.  Patient will require no more than modA for floor recovery in order to promote safe recovery from falls with caregiver at home. Baseline: Pt needs review due to fluctuation w/ cognition changes. Goal status: INITIAL  ASSESSMENT:  CLINICAL IMPRESSION: Emphasis of skilled PT session on reciprocal coordination, high amplitude movement, and endurance. Pt's BP more controlled this date but continues to be orthostatic. However, pt less symptomatic today following medication change a week ago. Pt states he would like to return to [facility] and pt performed boxing well during session with no instability noted but did have  difficulty w/reciprocal coordination. Pt and daughter to contact [facility] and inquire about returning prior to next session. Pt demonstrates reduced coordination and amplitude of movement on L hemibody > R but responds well to use of visual cues. Continue POC.   OBJECTIVE IMPAIRMENTS: Abnormal gait, decreased activity tolerance, decreased balance, decreased cognition, decreased coordination, decreased endurance, difficulty walking, improper body mechanics, postural dysfunction, and orthostatic BP .   ACTIVITY LIMITATIONS: carrying, lifting, bending, squatting, stairs, and locomotion level  PARTICIPATION LIMITATIONS: meal prep, cleaning, laundry, driving, and community activity  PERSONAL FACTORS: Age, Past/current experiences, Time since onset of injury/illness/exacerbation, Transportation, and 1-2 comorbidities: cervical myelopathy and macular degeneration  are also affecting patient's functional outcome.   REHAB POTENTIAL: Good  CLINICAL DECISION MAKING: Evolving/moderate complexity  EVALUATION COMPLEXITY: Moderate  PLAN:  PT FREQUENCY: 2x/week  PT DURATION: 8 weeks  PLANNED INTERVENTIONS: Therapeutic exercises, Therapeutic activity, Neuromuscular re-education, Balance training, Gait training, Patient/Family education, Self Care, Stair training, Vestibular training, DME instructions, and Re-evaluation  PLAN FOR NEXT SESSION:  Did they contact [facility]? DC?  Pt has standing orthostatic BP-did they bring abdominal binder and compression stockings to practice donning/assess if it's helping! Pt did have meds changed on 5/7.  REVIEW/UPDATE HEP.   Add to HEP for balance. Assess R inattention, gait training w/ vs w/o rollator (pt and daughter's goal to get away from AD), dot drills, boxing, balance w/reduced visual acuity, treadmill training w/ ball kicks, dual tasking   Alanta Scobey E Sofie Schendel, PT, DPT 12/08/2022, 11:04 AM

## 2022-12-12 ENCOUNTER — Ambulatory Visit: Payer: Self-pay | Admitting: Physical Therapy

## 2022-12-14 ENCOUNTER — Ambulatory Visit: Payer: Medicare Other | Admitting: Physical Therapy

## 2022-12-14 VITALS — BP 114/65 | HR 57

## 2022-12-14 DIAGNOSIS — R2681 Unsteadiness on feet: Secondary | ICD-10-CM | POA: Diagnosis not present

## 2022-12-14 DIAGNOSIS — M6281 Muscle weakness (generalized): Secondary | ICD-10-CM | POA: Diagnosis not present

## 2022-12-14 DIAGNOSIS — R2689 Other abnormalities of gait and mobility: Secondary | ICD-10-CM | POA: Diagnosis not present

## 2022-12-14 DIAGNOSIS — R293 Abnormal posture: Secondary | ICD-10-CM | POA: Diagnosis not present

## 2022-12-14 DIAGNOSIS — R29818 Other symptoms and signs involving the nervous system: Secondary | ICD-10-CM | POA: Diagnosis not present

## 2022-12-14 NOTE — Therapy (Signed)
OUTPATIENT PHYSICAL THERAPY NEURO TREATMENT- DISCHARGE SUMMARY   Patient Name: Seth Carter MRN: [id number] DOB:Nov 19, 1940, 82 y.o., male Today's Date: 12/14/2022  PCP: [name], MD REFERRING PROVIDER: [name], MD  PHYSICAL THERAPY DISCHARGE SUMMARY  Visits from Start of Care: 14  Current functional level related to goals / functional outcomes: Mod I w/ambulation w/use of rollator, SBA w/cane or no AD   Remaining deficits: Shuffling gait, festination, orthostatic hypotension    Education / Equipment: HEP, Plan to return to [facility]    Patient agrees to discharge. Patient goals were partially met. Patient is being discharged due to  wanting to return to [facility] and being pleased with current functional level .   END OF SESSION:  PT End of Session - 12/14/22 1024     Visit Number 14    Number of Visits 17   16 + eval   Date for PT Re-Evaluation 12/22/22   pushed out due to scheduling   Authorization Type MEDICARE PART A AND B    Progress Note Due on Visit 20    PT Start Time 1020   Previous pt session ran late   PT Stop Time 1105    PT Time Calculation (min) 45 min    Equipment Utilized During Treatment Gait belt    Activity Tolerance Patient tolerated treatment well    Behavior During Therapy WFL for tasks assessed/performed               Past Medical History:  Diagnosis Date   Acute pharyngitis    AKI (acute kidney injury) 09/29/2020   Allergic rhinitis    Benign prostatic hyperplasia with lower urinary tract symptoms 09/23/2020   Bradykinesia    Cervical spondylosis without myelopathy 12/18/2019   Chronic back pain    Chronic sinusitis    Degeneration of lumbar intervertebral disc 08/20/2020   ED (erectile dysfunction)    Enlarged prostate 12/15/2020   Essential hypertension    nuclear test in epic 01-16-2020 normal perfusion no ischemia, normal lvf and wall motion, nuclear ef 65%   Family history of  hemochromatosis    Fatigue    Gastroesophageal reflux disease without esophagitis 12/18/2019   Generalized weakness    Glaucoma, both eyes    Hardening of the aorta (main artery of the heart)    Hemorrhoids    History of syncope    cardiology evaluation by [name], note in epic 12-26-2019, work-up includes event monitor 02-04-2020( showed SR with occ PAC & PVC rare PAT)/ echo  12-26-2019 (mild LVH, ef 50-55%, moderate MR, mild to moderate AV sclerosis no stenosis, ascending aorta 39mm) and nuclear test 01-16-2020 (normal perfusion w/ normal LV and wall motion, nuclear ef 65%)   Hyperglycemia 12/18/2019   Hypogonadism in male    Leukocytosis 09/29/2020   Macular degeneration, right eye    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   Mixed hyperlipidemia    Onychomycosis 12/18/2019   Osteoarthritis of knee 12/18/2019   Pain in hand 12/18/2019   Parkinson's disease 08/2020   Renal stone 12/18/2019   Sebaceous cyst 12/18/2019   Seizure-like activity 09/29/2020   Spinal stenosis of lumbar region 08/20/2020   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/2019   Toxic effect of venom of wasps 12/18/2019   Vitamin B12 deficiency    Vitamin D deficiency    Past Surgical History:  Procedure Laterality Date   ANKLE SURGERY Right 10-04-2015  @[facility]    debridement achilles tendon &  reattachment and heel excision osteophyte   CATARACT EXTRACTION W/ INTRAOCULAR LENS  IMPLANT, BILATERAL  yrs ago   CYSTOSCOPY WITH INSERTION OF UROLIFT  2019 approx.   SHOULDER SURGERY Right 1990's   TONSILLECTOMY  age 36   TRANSURETHRAL RESECTION OF PROSTATE N/A 09/23/2020   Procedure: TRANSURETHRAL RESECTION OF THE PROSTATE (TURP);  Surgeon: [name], MD;  Location: [facility];  Service: Urology;  Laterality: N/A;  1 MINS   Patient Active Problem List   Diagnosis Date Noted   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   Allergic rhinitis    Chronic sinusitis     Enlarged prostate    Hardening of the aorta (main artery of the heart)    Vitamin B12 deficiency    Seizure-like activity 09/29/2020   Parkinson's disease 09/29/2020   Leukocytosis 09/29/2020   Benign prostatic hyperplasia with lower urinary tract symptoms 09/23/2020   Degeneration of lumbar intervertebral disc 08/20/2020   Spinal stenosis of lumbar region 08/20/2020   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/2019   Hemorrhoids 12/18/2019   Vitamin D deficiency 12/18/2019   Male erectile dysfunction 12/18/2019   Osteoarthritis of knee 12/18/2019   Impaired fasting glucose 12/18/2019   Gastroesophageal reflux disease without esophagitis 12/18/2019   Essential hypertension 12/18/2019   Mixed hyperlipidemia 12/18/2019   Fatigue 12/18/2019   Pain in hand 12/18/2019   Onychomycosis 12/18/2019   Cervical spondylosis without myelopathy 12/18/2019   Toxic effect of venom of wasps 12/18/2019   Right flank pain 12/18/2019   Family history of hemochromatosis 12/18/2019   Pure hypercholesterolemia 12/18/2019   Hyperglycemia 12/18/2019   Shoulder joint pain 01/29/2019    ONSET DATE: 10/11/2022  REFERRING DIAG: I63.81 (ICD-10-CM) - Other cerebral infarction due to occlusion or stenosis of small artery  THERAPY DIAG:  Unsteadiness on feet  Other abnormalities of gait and mobility  Muscle weakness (generalized)  Rationale for Evaluation and Treatment: Rehabilitation  SUBJECTIVE:                                                                                                                                                                                             SUBJECTIVE STATEMENT: Pt reports he has not been dizzy much this week. Ambulated into clinic w/3-prong cane. States he is going back to [facility] next week on Tuesday and Thursday and would like to DC today. No falls  Pt accompanied by: family member-Daughter in Lobby   PERTINENT HISTORY: GERD, HTN, cervical myelopathy,  Parkinson's Disease w/ mild neurocognitive disorder (diagnosed 2022), lumbar spinal stenosis, macular degeneration with difficulty with depth perception  From neurology note 09/06/2022:  On 08/10/22, his daughter called to report a sudden clinical decline: Began requiring Rollator to walk short distances, had been going a mile or so by himself without Rollator. He is tired. His BP is high sitting and standing now. I advised he go to Urgent Care today to rule out any infections going on (& since they have traveled out of town and he experienced this sudden change.)  On 08/21/22, he messaged: "I had sudden onset weakness, light headedness, feeling bad and BP fluctuating very high. Went to local ER and had CT scan which showed possible area of stroke. Transferred to [facility] and admitted. Had MRI, MRA, Heart & carotid arteries ultrasound, along with EKG and extensive bloodwork. MRI didn't show a stroke & no definitive reason for flare up."   Pt is known to clinic from prior episode ending 12/23/2020.  PAIN:  Are you having pain? No  PRECAUTIONS: Fall and Other: orthostatic BP  WEIGHT BEARING RESTRICTIONS: No  FALLS: Has patient fallen in last 6 months? Yes. Number of falls 2-fell in the yard last week when stepping in a hole  LIVING ENVIRONMENT: Lives with: lives with their spouse-has one small dog Lives in: House/apartment Stairs: Yes: Internal: 14-16 steps; on left going up and External: 3 steps; on right going up Has following equipment at home: Single point cane, Environmental consultant - 2 wheeled, Environmental consultant - 4 wheeled, Wheelchair (manual), Shower bench, Grab bars, and chair lift and insertable bed rails  PLOF: Independent with community mobility with device, Independent with transfers, Requires assistive device for independence, Needs assistance with ADLs, and supervision for ADLs  PATIENT GOALS: "To get back to where I was before our incident in [place]."  OBJECTIVE:   DIAGNOSTIC  FINDINGS:  DAT scan 03/15/2021 IMPRESSION: Tomma Rakers are irregular-shaped with decreased radiotracer activity. Loss of normal activity activity is greater on the LEFT. Findings are suggestive of Parkinsonian syndrome pathology.  COGNITION: Overall cognitive status: History of cognitive impairments - at baseline   SENSATION: WFL  COORDINATION: LE RAMS:  slow and deliberate Heel-to-shin: WFL bilaterally  EDEMA:  None noted in BLE  POSTURE: rounded shoulders and forward head  LOWER EXTREMITY ROM:     Active  Right Eval Left Eval  Hip flexion Grossly WFL  Hip extension   Hip abduction   Hip adduction   Hip internal rotation   Hip external rotation   Knee flexion   Knee extension   Ankle dorsiflexion   Ankle plantarflexion   Ankle inversion    Ankle eversion     (Blank rows = not tested)  LOWER EXTREMITY MMT:    MMT Right Eval Left Eval  Hip flexion 4+/5 4+/5  Hip extension    Hip abduction    Hip adduction    Hip internal rotation    Hip external rotation    Knee flexion    Knee extension 5/5 5/5  Ankle dorsiflexion 4+/5 4/5  Ankle plantarflexion    Ankle inversion    Ankle eversion    (Blank rows = not tested)  BED MOBILITY:  Sit to supine Modified independence Supine to sit Modified independence *uses bed rail intermittently  TODAY'S TREATMENT:    THERACT: Assessed BP at onset of session in seated and standing positions of LUE. Pt asymptomatic today  Today's Vitals   12/14/22 1030 12/14/22 1032  BP: (!) 155/70 114/65  Pulse: (!) 56 (!) 57    LTG Assessment    [facility] PT Assessment - 12/14/22 1049  Mini-BESTest   Sit To Stand Normal: Comes to stand without use of hands and stabilizes independently.    Rise to Toes Moderate: Heels up, but not full range (smaller than when holding hands), OR noticeable instability for 3 s.    Stand on one leg (left) Moderate: < 20 s   2.72s   Stand on one leg (right) Moderate: < 20 s   2.25s   Stand on one  leg - lowest score 1    Compensatory Stepping Correction - Forward Normal: Recovers independently with a single, large step (second realignement is allowed).    Compensatory Stepping Correction - Backward Normal: Recovers independently with a single, large step    Compensatory Stepping Correction - Left Lateral Moderate: Several steps to recover equilibrium   several small steps   Compensatory Stepping Correction - Right Lateral Severe:  Falls, or cannot step    Stepping Corredtion Lateral - lowest score 0    Stance - Feet together, eyes open, firm surface  Normal: 30s    Stance - Feet together, eyes closed, foam surface  Normal: 30s    Incline - Eyes Closed Normal: Stands independently 30s and aligns with gravity    Change in Gait Speed Normal: Significantly changes walkling speed without imbalance    Walk with head turns - Horizontal Moderate: performs head turns with reduction in gait speed.   Lateral deviations   Walk with pivot turns Moderate:Turns with feet close SLOW (>4 steps) with good balance.   FOG noted, sliding of LLE   Step over obstacles Normal: Able to step over box with minimal change of gait speed and with good balance.    Timed UP & GO with Dual Task Severe: Stops counting while walking OR stops walking while counting.    Mini-BEST total score 20      Timed Up and Go Test   Normal TUG (seconds) 10.47    Cognitive TUG (seconds) 14.31   Pt stopped counting, cues to sit down             Gait pattern: step through pattern, decreased arm swing- Right, decreased stride length, decreased hip/knee flexion- Right, decreased ankle dorsiflexion- Right, Right foot flat, Left foot flat, shuffling, decreased trunk rotation, trunk flexed, and poor foot clearance- Right Distance walked: 115' loop completed 7x + 66' = 871'  Assistive device utilized: None Level of assistance: SBA Comments: No instability noted. Pt reported he could have ambulated more quickly but chose not to.     Gait pattern: step through pattern, decreased stride length, Right foot flat, Left foot flat, decreased trunk rotation, trunk flexed, and poor foot clearance- Right Distance walked: Various clinic distances  Assistive device utilized:  SPC w/3-prong tip Level of assistance: SBA Comments: Pt holding cane rather than using it     PATIENT EDUCATION: Education details:  Goal outcomes, continue to recommend use of AD as pt safest and most independent w/rollator and due to orthostatic hypotension. Plan to return to PT if [facility] is too advanced for pt.  Person educated: Patient and Daughter -spoke to daughter in lobby  Education method: Medical illustrator Education comprehension: verbalized understanding  HOME EXERCISE PROGRAM: Seated PWR moves   Access Code: [email or web address] URL: [email or web address] Date: 11/28/2022 Prepared by: Camille Bal  Exercises - Side to Side Weight Shift with Overhead Reach and Counter Support  - 1-2 x daily - 5 x weekly - 2 sets - 10 reps - Step Sideways with Arms Reaching  -  1-2 x daily - 5 x weekly - 2 sets - 10 reps - Seated Reaching Across Body  - 1 x daily - 5 x weekly - 2 sets - 10 reps - Seated Reaching to Side  - 1 x daily - 5 x weekly - 2 sets - 10 reps  GOALS: Goals reviewed with patient? Yes  SHORT TERM GOALS: Target date: 11/17/2022  Pt will be independent with initial strength and balance HEP to improve quality of movement. Baseline:  To be established. Goal status: IN PROGRESS  2.  Pt will decrease 5xSTS to </=12 seconds w/o UE support or LE touching chair in order to demonstrate decreased risk for falls and improved functional bilateral LE strength and power. Baseline: 12.54 seconds w/ hands-on-knees and x1 instance of back of legs on chair; 9.75s without UE support  Goal status: MET  3.  to be assessed w/ goal set as appropriate. Baseline: LTG set only due to initial performance. Goal status:  REVISED-D/C' (4/2)  4.  Pt will demonstrate increased miniBEST score of >/=20/28 for improved dynamic balance and postural responses to decrease risk of falls. Baseline: 16/28 (4/2); 20/28 (4/26) Goal status: MET  5.  Fall prevention and freezing management strategies to be reviewed with patient to promote improved safety with functional mobility. Baseline: To be provided. Goal status: IN PROGRESS  LONG TERM GOALS: Target date: 12/15/2022  Patient to be independent with finalized strength and balance HEP to include PWR! If appropriate for patient level in order to promote improved functional mobility. Baseline:  To be established. Goal status: MET  2.  Pt will ambulate >/=1200 feet on to demonstrate improved functional endurance for home and community participation. Baseline: 1019 ft w/ rollator; 871' no AD Goal status: NOT MET  3.  Pt will demonstrate increased miniBEST score of >/=24/28 for improved dynamic balance and postural responses to decrease risk of falls. Baseline:  16/28 (4/2); 20/28 (5/23) Goal status: NOT MET  4.  Patient will require no more than modA for floor recovery in order to promote safe recovery from falls with caregiver at home. Baseline: Pt needs review due to fluctuation w/ cognition changes; mod I on 11/07/22 Goal status: MET  ASSESSMENT:  CLINICAL IMPRESSION: Emphasis of skilled PT session on LTG assessment and DC from PT. Pt requesting to DC at this time as he will be returning to [facility] next week on Tuesday and Thursday and would like to focus on that. Pt has met 2 of 4 LTGs, performing his HEP independently and performing floor transfers mod I to safely assist w/wife at home. Pt maintained his score of 20/28 on MiniBest from previous assessment, indicative of pt being close to baseline function. Pt continues to demonstrate freezing of gait w/turns and decreased step length/clearance of RLE. Pt unable to facilitate stepping strategy to R  side this date and had significant difficulty w/dual-task of cog TUG. Pt did perform without AD and SBA but did not improve speed or distance. Continue to recommend pt use AD at all times for reduced fall risk and independence as well as orthostatic hypotension. Pt and daughter verbalized understanding.   OBJECTIVE IMPAIRMENTS: Abnormal gait, decreased activity tolerance, decreased balance, decreased cognition, decreased coordination, decreased endurance, difficulty walking, improper body mechanics, postural dysfunction, and orthostatic BP .   ACTIVITY LIMITATIONS: carrying, lifting, bending, squatting, stairs, and locomotion level  PARTICIPATION LIMITATIONS: meal prep, cleaning, laundry, driving, and community activity  PERSONAL FACTORS: Age, Past/current experiences, Time  since onset of injury/illness/exacerbation, Transportation, and 1-2 comorbidities: cervical myelopathy and macular degeneration  are also affecting patient's functional outcome.   REHAB POTENTIAL: Good  CLINICAL DECISION MAKING: Evolving/moderate complexity  EVALUATION COMPLEXITY: Moderate  PLAN:  PT FREQUENCY: 2x/week  PT DURATION: 8 weeks  PLANNED INTERVENTIONS: Therapeutic exercises, Therapeutic activity, Neuromuscular re-education, Balance training, Gait training, Patient/Family education, Self Care, Stair training, Vestibular training, DME instructions, and Re-evaluation    Olufemi Mofield E Wynnie Pacetti, PT, DPT 12/14/2022, 11:13 AM

## 2022-12-15 ENCOUNTER — Ambulatory Visit: Payer: Self-pay | Admitting: Physical Therapy

## 2022-12-15 DIAGNOSIS — G20B2 Parkinson's disease with dyskinesia, with fluctuations: Secondary | ICD-10-CM | POA: Diagnosis not present

## 2022-12-15 DIAGNOSIS — F32A Depression, unspecified: Secondary | ICD-10-CM | POA: Diagnosis not present

## 2022-12-15 DIAGNOSIS — I6381 Other cerebral infarction due to occlusion or stenosis of small artery: Secondary | ICD-10-CM | POA: Diagnosis not present

## 2022-12-15 DIAGNOSIS — I951 Orthostatic hypotension: Secondary | ICD-10-CM | POA: Diagnosis not present

## 2022-12-15 DIAGNOSIS — N400 Enlarged prostate without lower urinary tract symptoms: Secondary | ICD-10-CM | POA: Diagnosis not present

## 2022-12-29 ENCOUNTER — Emergency Department (HOSPITAL_COMMUNITY): Payer: Medicare Other

## 2022-12-29 ENCOUNTER — Observation Stay (HOSPITAL_COMMUNITY)
Admission: EM | Admit: 2022-12-29 | Discharge: 2023-01-01 | Disposition: A | Discharge status: Home Health | Payer: Medicare Other | Attending: Internal Medicine | Admitting: Internal Medicine

## 2022-12-29 ENCOUNTER — Other Ambulatory Visit: Payer: Self-pay

## 2022-12-29 DIAGNOSIS — Z79899 Other long term (current) drug therapy: Secondary | ICD-10-CM | POA: Diagnosis not present

## 2022-12-29 DIAGNOSIS — Z7982 Long term (current) use of aspirin: Secondary | ICD-10-CM | POA: Insufficient documentation

## 2022-12-29 DIAGNOSIS — R29810 Facial weakness: Secondary | ICD-10-CM | POA: Diagnosis not present

## 2022-12-29 DIAGNOSIS — I160 Hypertensive urgency: Secondary | ICD-10-CM | POA: Diagnosis not present

## 2022-12-29 DIAGNOSIS — I10 Essential (primary) hypertension: Secondary | ICD-10-CM | POA: Diagnosis not present

## 2022-12-29 DIAGNOSIS — R4701 Aphasia: Secondary | ICD-10-CM | POA: Insufficient documentation

## 2022-12-29 DIAGNOSIS — I672 Cerebral atherosclerosis: Secondary | ICD-10-CM | POA: Insufficient documentation

## 2022-12-29 DIAGNOSIS — G20A1 Parkinson's disease without dyskinesia, without mention of fluctuations: Secondary | ICD-10-CM | POA: Diagnosis not present

## 2022-12-29 DIAGNOSIS — I6381 Other cerebral infarction due to occlusion or stenosis of small artery: Secondary | ICD-10-CM | POA: Insufficient documentation

## 2022-12-29 DIAGNOSIS — R29818 Other symptoms and signs involving the nervous system: Principal | ICD-10-CM | POA: Insufficient documentation

## 2022-12-29 DIAGNOSIS — R4182 Altered mental status, unspecified: Secondary | ICD-10-CM | POA: Diagnosis present

## 2022-12-29 DIAGNOSIS — R404 Transient alteration of awareness: Secondary | ICD-10-CM | POA: Diagnosis not present

## 2022-12-29 DIAGNOSIS — R2990 Unspecified symptoms and signs involving the nervous system: Secondary | ICD-10-CM | POA: Diagnosis not present

## 2022-12-29 DIAGNOSIS — I951 Orthostatic hypotension: Secondary | ICD-10-CM

## 2022-12-29 LAB — PROTIME-INR
INR: 1.2 (ref 0.8–1.2)
Prothrombin Time: 15 seconds (ref 11.4–15.2)

## 2022-12-29 LAB — COMPREHENSIVE METABOLIC PANEL
ALT: <5 U/L (ref 0–44)
AST: 19 U/L (ref 15–41)
Albumin: 3.7 g/dL (ref 3.5–5.0)
Alkaline Phosphatase: 62 U/L (ref 38–126)
Anion gap: 9 (ref 5–15)
BUN: 24 mg/dL — ABNORMAL HIGH (ref 8–23)
CO2: 28 mmol/L (ref 22–32)
Calcium: 9.5 mg/dL (ref 8.9–10.3)
Chloride: 102 mmol/L (ref 98–111)
Creatinine, Ser: 1.11 mg/dL (ref 0.61–1.24)
GFR, Estimated: >60 mL/min (ref >60)
Glucose, Bld: 137 mg/dL — ABNORMAL HIGH (ref 70–99)
Potassium: 4.1 mmol/L (ref 3.5–5.1)
Sodium: 139 mmol/L (ref 135–145)
Total Bilirubin: 0.6 mg/dL (ref 0.3–1.2)
Total Protein: 6.6 g/dL (ref 6.5–8.1)

## 2022-12-29 LAB — I-STAT CHEM 8, ED
BUN: 26 mg/dL — ABNORMAL HIGH (ref 8–23)
Calcium, Ion: 1.23 mmol/L (ref 1.15–1.40)
Chloride: 101 mmol/L (ref 98–111)
Creatinine, Ser: 1.1 mg/dL (ref 0.61–1.24)
Glucose, Bld: 135 mg/dL — ABNORMAL HIGH (ref 70–99)
HCT: 37 % — ABNORMAL LOW (ref 39.0–52.0)
Hemoglobin: 12.6 g/dL — ABNORMAL LOW (ref 13.0–17.0)
Potassium: 4.2 mmol/L (ref 3.5–5.1)
Sodium: 139 mmol/L (ref 135–145)
TCO2: 29 mmol/L (ref 22–32)

## 2022-12-29 LAB — CBC
HCT: 38.6 % — ABNORMAL LOW (ref 39.0–52.0)
Hemoglobin: 13.1 g/dL (ref 13.0–17.0)
MCH: 31.5 pg (ref 26.0–34.0)
MCHC: 33.9 g/dL (ref 30.0–36.0)
MCV: 92.8 fL (ref 80.0–100.0)
Platelets: 166 10*3/uL (ref 150–400)
RBC: 4.16 MIL/uL — ABNORMAL LOW (ref 4.22–5.81)
RDW: 12.8 % (ref 11.5–15.5)
WBC: 8 10*3/uL (ref 4.0–10.5)
nRBC: 0 % (ref 0.0–0.2)

## 2022-12-29 LAB — DIFFERENTIAL
Abs Immature Granulocytes: 0.01 10*3/uL (ref 0.00–0.07)
Basophils Absolute: 0 10*3/uL (ref 0.0–0.1)
Basophils Relative: 1 %
Eosinophils Absolute: 0.2 10*3/uL (ref 0.0–0.5)
Eosinophils Relative: 2 %
Immature Granulocytes: 0 %
Lymphocytes Relative: 15 %
Lymphs Abs: 1.2 10*3/uL (ref 0.7–4.0)
Monocytes Absolute: 0.6 10*3/uL (ref 0.1–1.0)
Monocytes Relative: 8 %
Neutro Abs: 6 10*3/uL (ref 1.7–7.7)
Neutrophils Relative %: 74 %

## 2022-12-29 LAB — APTT: aPTT: 31 seconds (ref 24–36)

## 2022-12-29 LAB — CBG MONITORING, ED: Glucose-Capillary: 122 mg/dL — ABNORMAL HIGH (ref 70–99)

## 2022-12-29 LAB — ETHANOL: Alcohol, Ethyl (B): <10 mg/dL (ref <10)

## 2022-12-29 MED ORDER — CARBIDOPA-LEVODOPA 25-100 MG PO TABS
2.0000 | ORAL_TABLET | Freq: Three times a day (TID) | ORAL | Status: DC
  Administered 2022-12-29 – 2023-01-01 (×8): 2 via ORAL
  Filled 2022-12-29 (×9): qty 2

## 2022-12-29 MED ORDER — CARBIDOPA-LEVODOPA 25-100 MG PO TABS: 2.0000 | ORAL_TABLET | Freq: Three times a day (TID) | ORAL | Status: DC

## 2022-12-29 NOTE — ED Triage Notes (Signed)
Patient's daughter reports episode of disorientation and altered gait lasting about fifteen minutes. Patient has hx of Parkinson's disease and daughter states it was like a Parkinson's freeze where he was spatially disoriented and confused with a possible facial droop. Symptoms have resolved at the time of triage.

## 2022-12-29 NOTE — H&P (Signed)
History and Physical    Seth Carter DOB: [birth year] DOA: 12/29/2022  PCP: Marden Noble, MD (Inactive)  Patient coming from: Home  I have personally briefly reviewed patient's old medical records in [facility]  Chief Complaint: Dizziness, confusion, facial droop, gait abnormality  HPI: Seth Carter is a 82 y.o. male with medical history significant for Parkinson's disease, neurogenic orthostatic hypotension on Florinef, TIA, BPH s/p TURP who presented to the ED for evaluation of***  ED Course  Labs/Imaging on admission: I have personally reviewed following labs and imaging studies.  Initial vitals showed BP 206/104, pulse 56, RR 17, temp 98.0 F, SpO2 100% on room air.  Labs show WBC 8.0, hemoglobin 13.1, platelets 166,000, sodium 139, potassium 4.1, bicarb 28, BUN 24, creatinine 1.11, serum glucose 137, LFTs within normal limits.  CT head without contrast shows left mastoid effusion, no acute intracranial abnormality.  Atrophy with chronic small vessel white matter ischemic disease noted.  Neurology consulted and recommended medical admission for CVA workup.  The hospitalist service was consulted to admit for further evaluation and management.  Review of Systems: All systems reviewed and are negative except as documented in history of present illness above.   Past Medical History:  Diagnosis Date   Acute pharyngitis    AKI (acute kidney injury) 09/29/2020   Allergic rhinitis    Benign prostatic hyperplasia with lower urinary tract symptoms 09/23/2020   Bradykinesia    Cervical spondylosis without myelopathy 12/18/2019   Chronic back pain    Chronic sinusitis    Degeneration of lumbar intervertebral disc 08/20/2020   ED (erectile dysfunction)    Enlarged prostate 12/15/2020   Essential hypertension    nuclear test in epic 01-16-2020 normal perfusion no ischemia, normal lvf and wall motion, nuclear ef 65%   Family history of hemochromatosis    Fatigue     Gastroesophageal reflux disease without esophagitis 12/18/2019   Generalized weakness    Glaucoma, both eyes    Hardening of the aorta (main artery of the heart)    Hemorrhoids    History of syncope    cardiology evaluation by [name], note in epic 12-26-2019, work-up includes event monitor 02-04-2020( showed SR with occ PAC & PVC rare PAT)/ echo  12-26-2019 (mild LVH, ef 50-55%, moderate MR, mild to moderate AV sclerosis no stenosis, ascending aorta 39mm) and nuclear test 01-16-2020 (normal perfusion w/ normal LV and wall motion, nuclear ef 65%)   Hyperglycemia 12/18/2019   Hypogonadism in male    Leukocytosis 09/29/2020   Macular degeneration, right eye    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   Mixed hyperlipidemia    Onychomycosis 12/18/2019   Osteoarthritis of knee 12/18/2019   Pain in hand 12/18/2019   Parkinson's disease 08/2020   Renal stone 12/18/2019   Sebaceous cyst 12/18/2019   Seizure-like activity 09/29/2020   Spinal stenosis of lumbar region 08/20/2020   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/2019   Toxic effect of venom of wasps 12/18/2019   Vitamin B12 deficiency    Vitamin D deficiency     Past Surgical History:  Procedure Laterality Date   ANKLE SURGERY Right 10-04-2015  @[facility]    debridement achilles tendon & reattachment and heel excision osteophyte   CATARACT EXTRACTION W/ INTRAOCULAR LENS  IMPLANT, BILATERAL  yrs ago   CYSTOSCOPY WITH INSERTION OF UROLIFT  2019 approx.   SHOULDER SURGERY Right 1990's   TONSILLECTOMY  age 46   TRANSURETHRAL RESECTION OF PROSTATE N/A 09/23/2020  Procedure: TRANSURETHRAL RESECTION OF THE PROSTATE (TURP);  Surgeon: [name], MD;  Location: [facility];  Service: Urology;  Laterality: N/A;  38 MINS    Social History:  reports that he has never smoked. He has never used smokeless tobacco. He reports current alcohol use of about 5.0 - 6.0 standard drinks of alcohol per week. He reports that he does  not use drugs.  Allergies  Allergen Reactions   Cialis [Tadalafil] Other (See Comments)    Severe back pain   Milk-Related Compounds Diarrhea and Other (See Comments)    Upsets the stomach   Pneumovax [Pneumococcal Polysaccharide Vaccine] Swelling and Other (See Comments)    Arm swelling and pain   Milk (Cow) Diarrhea and Other (See Comments)    Upsets the stomach   Simvastatin Other (See Comments)    Nightmares    Ace Inhibitors Cough   Contrast Media [Iodinated Contrast Media] Itching, Rash and Other (See Comments)    POSSIBLE delayed reaction = all-over body rash and seizures that began after this was used   Sinemet [Carbidopa W-Levodopa] Itching, Rash and Other (See Comments)    POSSIBLE delayed reaction = all-over body rash and seizures that began after starting this    Family History  Problem Relation Age of Onset   Cancer Mother    Heart attack Father    Melanoma Father    Alzheimer's disease Sister    Heart disease Sister    Other Sister        Polio   Thyroid disease Daughter    Hemachromatosis Daughter    High blood pressure Daughter    Diabetes Daughter      Prior to Admission medications   Medication Sig Start Date End Date Taking? Authorizing Provider  amLODipine (NORVASC) 5 MG tablet Take 5 mg by mouth daily. Patient not taking: Reported on 10/16/2022 05/21/20   [provider]  aspirin EC 81 MG tablet Take 81 mg by mouth at bedtime. Swallow whole.    [provider]  Besifloxacin HCl (BESIVANCE) 0.6 % SUSP Place 1 drop into the right eye See admin instructions. Instill 1 drop into the right eye three times a a day for for 3 days after the monthly shot    [provider]  cetirizine (ZYRTEC) 10 MG tablet Take 10 mg by mouth daily.    [provider]  cholecalciferol (VITAMIN D3) 25 MCG (1000 UNIT) tablet Take 1,000 Units by mouth daily.    [provider]  Coenzyme Q10 (CO Q-10) 100 MG CAPS Take 100 mg by mouth  daily. Patient not taking: Reported on 10/16/2022    [provider]  docusate sodium (COLACE) 100 MG capsule Take 1 capsule (100 mg total) by mouth every 12 (twelve) hours. Patient not taking: Reported on 10/16/2022 09/28/20   Pricilla Loveless, MD  dorzolamide-timolol (COSOPT) 22.3-6.8 MG/ML ophthalmic solution Place 1 drop into both eyes 2 (two) times daily.    [provider]  famotidine (PEPCID) 40 MG tablet Take one tablet by mouth once daily as directed. Patient not taking: Reported on 10/16/2022 10/29/20   Jessica Priest, MD  latanoprost (XALATAN) 0.005 % ophthalmic solution Place 1 drop into both eyes at bedtime.    [provider]  Multiple Vitamin (MULTIVITAMIN WITH MINERALS) TABS tablet Take 1 tablet by mouth daily.    [provider]  Multiple Vitamins-Minerals (PRESERVISION/LUTEIN) CAPS Take 1 capsule by mouth daily.    [provider]  prednisoLONE  acetate (PRED FORTE) 1 % ophthalmic suspension Place 1 drop into both eyes 4 (four) times daily. Patient not taking: Reported on 10/16/2022    [provider]  triamcinolone (NASACORT) 55 MCG/ACT AERO nasal inhaler Place 2 sprays into the nose daily as needed (for allergies). Patient not taking: Reported on 10/16/2022    [provider]  valsartan (DIOVAN) 80 MG tablet Take 80 mg by mouth daily. Patient not taking: Reported on 10/16/2022    [provider]  vitamin B-12 (CYANOCOBALAMIN) 1000 MCG tablet Take 1,000 mcg by mouth daily.    [provider]    Physical Exam: Vitals:   12/29/22 1629 12/29/22 2034 12/29/22 2145 12/29/22 2315  BP: (!) 145/72 (!) 206/104 (!) 222/79 (!) 222/79  Pulse: (!) 56 (!) 56 63 64  Resp: 16 17 17 17   Temp: 97.9 F (36.6 C) 98 F (36.7 C) 97.9 F (36.6 C) 98 F (36.7 C)  TempSrc: Oral Oral Oral Oral  SpO2: 99% 100% 99% 100%  Weight:  83.9 kg    Height:  5\' 11"  (1.803 m)     *** Constitutional: NAD, calm, comfortable Eyes:  PERRL, lids and conjunctivae normal ENMT: Mucous membranes are moist. Posterior pharynx clear of any exudate or lesions.Normal dentition.  Neck: normal, supple, no masses. Respiratory: clear to auscultation bilaterally, no wheezing, no crackles. Normal respiratory effort. No accessory muscle use.  Cardiovascular: Regular rate and rhythm, no murmurs / rubs / gallops. No extremity edema. 2+ pedal pulses. Abdomen: no tenderness, no masses palpated. No hepatosplenomegaly. Bowel sounds positive.  Musculoskeletal: no clubbing / cyanosis. No joint deformity upper and lower extremities. Good ROM, no contractures. Normal muscle tone.  Skin: no rashes, lesions, ulcers. No induration Neurologic: CN 2-12 grossly intact. Sensation intact. Strength 5/5 in all 4.  Psychiatric: Normal judgment and insight. Alert and oriented x 3. Normal mood.   EKG: Personally reviewed. Sinus rhythm, rate 56, no acute ischemic changes.  Similar to prior.  Assessment/Plan Active Problems:   * No active hospital problems. *   *** No notes on file *** Assessment and Plan: No notes have been filed under this hospital service. Service: Hospitalist      DVT prophylaxis: ***  Code Status: ***  Family Communication: ***  Disposition Plan: ***  Consults called: ***  Severity of Illness: {Observation/Inpatient:21159}  Darreld Mclean MD [facility]  If 7PM-7AM, please contact night-coverage [email or web address]  12/30/2022, 12:01 AM

## 2022-12-29 NOTE — ED Provider Notes (Signed)
[place] EMERGENCY DEPARTMENT AT [facility] Provider Note   CSN: [id number] Arrival date & time: 12/29/22  1611     History {Add pertinent medical, surgical, social history, OB history to HPI:1} Chief Complaint  Patient presents with   Altered Mental Status    Seth Carter is a 82 y.o. male with HTN, HLD, parkinson's disease, neurogenic orthostatic hypotension, seizure-like activity, who presents with AMS. Patient presents with wife and daughter who provide additional history.  Went to get trash cans from the road, rode a golf cart down to the road, patient became very dizzy. Patient walked up to the house and then became disoriented, confused. Patient has hx of Parkinson's disease and daughter states it was like a "Parkinson's freeze." His eyes were open but "no one was home," began walking very stiffly and disoriented, pale. Daughter states he was "not there mentally" for 10 minutes. He wasn't alert enough for her to get him into the house until 10 minutes after it began. Hadn't been outside long, she doesn't think he was overheated. He has had orthostatic hypotension prior and this was different than that. He hasn't ever had a similar episode before. Has h/o TIA in January 2024. Recently had fludrocortisone added to meds in May for dizziness.     Altered Mental Status      Home Medications Prior to Admission medications   Medication Sig Start Date End Date Taking? Authorizing Provider  amLODipine (NORVASC) 5 MG tablet Take 5 mg by mouth daily. Patient not taking: Reported on 10/16/2022 05/21/20   [provider]  aspirin EC 81 MG tablet Take 81 mg by mouth at bedtime. Swallow whole.    [provider]  Besifloxacin HCl (BESIVANCE) 0.6 % SUSP Place 1 drop into the right eye See admin instructions. Instill 1 drop into the right eye three times a a day for for 3 days after the monthly shot    [provider]  cetirizine (ZYRTEC) 10 MG tablet Take  10 mg by mouth daily.    [provider]  cholecalciferol (VITAMIN D3) 25 MCG (1000 UNIT) tablet Take 1,000 Units by mouth daily.    [provider]  Coenzyme Q10 (CO Q-10) 100 MG CAPS Take 100 mg by mouth daily. Patient not taking: Reported on 10/16/2022    [provider]  docusate sodium (COLACE) 100 MG capsule Take 1 capsule (100 mg total) by mouth every 12 (twelve) hours. Patient not taking: Reported on 10/16/2022 09/28/20   Pricilla Loveless, MD  dorzolamide-timolol (COSOPT) 22.3-6.8 MG/ML ophthalmic solution Place 1 drop into both eyes 2 (two) times daily.    [provider]  famotidine (PEPCID) 40 MG tablet Take one tablet by mouth once daily as directed. Patient not taking: Reported on 10/16/2022 10/29/20   Jessica Priest, MD  latanoprost (XALATAN) 0.005 % ophthalmic solution Place 1 drop into both eyes at bedtime.    [provider]  Multiple Vitamin (MULTIVITAMIN WITH MINERALS) TABS tablet Take 1 tablet by mouth daily.    [provider]  Multiple Vitamins-Minerals (PRESERVISION/LUTEIN) CAPS Take 1 capsule by mouth daily.    [provider]  prednisoLONE acetate (PRED FORTE) 1 % ophthalmic suspension Place 1 drop into both eyes 4 (four) times daily. Patient not taking: Reported on 10/16/2022    [provider]  triamcinolone (NASACORT) 55 MCG/ACT AERO nasal inhaler Place 2 sprays into the nose daily as needed (for allergies). Patient not taking: Reported on 10/16/2022  [provider]  valsartan (DIOVAN) 80 MG tablet Take 80 mg by mouth daily. Patient not taking: Reported on 10/16/2022    [provider]  vitamin B-12 (CYANOCOBALAMIN) 1000 MCG tablet Take 1,000 mcg by mouth daily.    [provider]      Allergies    Cialis [tadalafil], Milk-related compounds, Pneumovax [pneumococcal polysaccharide vaccine], Milk (cow), Simvastatin, Ace inhibitors, Contrast media [iodinated contrast media], and  Sinemet [carbidopa w-levodopa]    Review of Systems   Review of Systems Review of systems Negative for f/c.  A 10 point review of systems was performed and is negative unless otherwise reported in HPI.  Physical Exam Updated Vital Signs BP (!) 222/79 (BP Location: Right Arm)   Pulse 63   Temp 97.9 F (36.6 C) (Oral)   Resp 17   Ht 5\' 11"  (1.803 m)   Wt 83.9 kg   SpO2 99%   BMI 25.80 kg/m  Physical Exam General: Normal appearing elderly male, lying in bed.  HEENT: PERRLA, EOMI, no nystagmus, Sclera anicteric, MMM, trachea midline. Tongue protrudes midline. NCAT. Masked facies. Cardiology: RRR, no murmurs/rubs/gallops. BL radial and DP pulses equal bilaterally.  Resp: Normal respiratory rate and effort. CTAB, no wheezes, rhonchi, crackles.  Abd: Soft, non-tender, non-distended. No rebound tenderness or guarding.  GU: Deferred. MSK: No peripheral edema or signs of trauma. Extremities without deformity or TTP. No cyanosis or clubbing. Skin: warm, dry.  Neuro: A&Ox4, CNs II-XII grossly intact. 5/5 strength in all extremities. Sensation grossly intact. Mild tremor noted. Slow but deliberate speech. Psych: Pleasant mood and affect.   ED Results / Procedures / Treatments   Labs (all labs ordered are listed, but only abnormal results are displayed) Labs Reviewed  CBC - Abnormal; Notable for the following components:      Result Value   RBC 4.16 (*)    HCT 38.6 (*)    All other components within normal limits  COMPREHENSIVE METABOLIC PANEL - Abnormal; Notable for the following components:   Glucose, Bld 137 (*)    BUN 24 (*)    All other components within normal limits  I-STAT CHEM 8, ED - Abnormal; Notable for the following components:   BUN 26 (*)    Glucose, Bld 135 (*)    Hemoglobin 12.6 (*)    HCT 37.0 (*)    All other components within normal limits  CBG MONITORING, ED - Abnormal; Notable for the following components:   Glucose-Capillary 122 (*)    All other components  within normal limits  PROTIME-INR  APTT  DIFFERENTIAL  ETHANOL    EKG EKG Interpretation  Date/Time:  Friday December 29 2022 16:20:37 EDT Ventricular Rate:  56 PR Interval:  178 QRS Duration: 96 QT Interval:  438 QTC Calculation: 422 R Axis:   29 Text Interpretation: Sinus bradycardia Similar to prior Confirmed by Vivi Barrack [phone]) on 12/29/2022 9:26:57 PM  Radiology CT HEAD WO CONTRAST  Result Date: 12/29/2022 CLINICAL DATA:  Transient ischemic attack. EXAM: CT HEAD WITHOUT CONTRAST TECHNIQUE: Contiguous axial images were obtained from the base of the skull through the vertex without intravenous contrast. RADIATION DOSE REDUCTION: This exam was performed according to the departmental dose-optimization program which includes automated exposure control, adjustment of the mA and/or kV according to patient size and/or use of iterative reconstruction technique. COMPARISON:  09/29/2020. FINDINGS: Brain: There is no evidence for acute hemorrhage, hydrocephalus, mass lesion, or abnormal extra-axial fluid collection. No definite CT evidence for acute infarction. Diffuse loss  of parenchymal volume is consistent with atrophy. Patchy low attenuation in the deep hemispheric and periventricular white matter is nonspecific, but likely reflects chronic microvascular ischemic demyelination. Chronic lacunar infarcts are seen in the basal ganglia bilaterally. Vascular: No hyperdense vessel or unexpected calcification. Skull: No evidence for fracture. No worrisome lytic or sclerotic lesion. Sinuses/Orbits: Left mastoid effusion is new in the interval. Right mastoid air cells are clear. Paranasal sinuses are clear. Visualized portions of the globes and intraorbital fat are unremarkable. Other: None. IMPRESSION: 1. Left mastoid effusion.  New since prior study. 2. No acute intracranial abnormality. 3. Atrophy with chronic small vessel white matter ischemic disease. Electronically Signed   By: Kennith Center M.D.   On:  12/29/2022 18:12    Procedures Procedures  {Document cardiac monitor, telemetry assessment procedure when appropriate:1}  Medications Ordered in ED Medications  carbidopa-levodopa (SINEMET IR) 25-100 MG per tablet immediate release 2 tablet (2 tablets Oral Given 12/29/22 2140)    ED Course/ Medical Decision Making/ A&P                          Medical Decision Making Amount and/or Complexity of Data Reviewed Labs: ordered. Decision-making details documented in ED Course. Radiology: ordered. Decision-making details documented in ED Course.  Risk Prescription drug management.    This patient presents to the ED for concern of ***, this involves an extensive number of treatment options, and is a complaint that carries with it a high risk of complications and morbidity.  I considered the following differential and admission for this acute, potentially life threatening condition.   MDM:    Ddx of acute altered mental status or encephalopathy considered but not limited to: -Intracranial abnormalities such as ICH, hydrocephalus, head trauma -Infection such as UTI, PNA - no reported infectious symptoms -Toxic ingestion such as opioid overdose, anticholinergic toxicity***, -Electrolyte abnormalities or hyper/hypoglycemia -Hypercarbia or hypoxia -Hepatic encephalopathy or uremia -ACS or arrhythmia -Endocrine abnormality such as thyroid storm or myxedema coma -Also consider hypertensive emergency vs PRES given patient's hypertension here in the ED. Has had BPs ranging from 140s systolic to 220s systolic. Typically does not have BP that high. He does have history of orthostatic hypotension which could have caused him to feel dizzy/be pale but this was not positional, also reports not similar to prior episodes of orthostasis in the past, and he hasn't had any low BPs here.  Clinical Course as of 12/29/22 2257  Fri Dec 29, 2022  1936 Glucose-Capillary(!): 122 [HN]  1936 Comprehensive  metabolic panel(!) Unremarkable in the context of this patient's presentation  [HN]  1936 WBC: 8.0 [HN]  1936 Hemoglobin: 13.1 [HN]  1936 INR: 1.2 [HN]  1936 Alcohol, Ethyl (B): <10 [HN]  1936 CT HEAD WO CONTRAST 1. Left mastoid effusion.  New since prior study. 2. No acute intracranial abnormality. 3. Atrophy with chronic small vessel white matter ischemic disease.   [HN]    Clinical Course User Index [HN] Loetta Rough, MD    Labs: I Ordered, and personally interpreted labs.  The pertinent results include:  those listed above  Imaging Studies ordered: I ordered imaging studies including CTH w/o contrast I independently visualized and interpreted imaging. I agree with the radiologist interpretation  Additional history obtained from chart review, family at bedside.  External records from outside source obtained and reviewed including [facility] neurology records  Cardiac Monitoring: The patient was maintained on a cardiac monitor.  I personally viewed and  interpreted the cardiac monitored which showed an underlying rhythm of: NSR  Reevaluation: After the interventions noted above, I reevaluated the patient and found that they have :{resolved/improved/worsened:23923::"improved"}  Social Determinants of Health: Patient lives independently with his wife  Disposition:  ***  Co morbidities that complicate the patient evaluation  Past Medical History:  Diagnosis Date   Acute pharyngitis    AKI (acute kidney injury) 09/29/2020   Allergic rhinitis    Benign prostatic hyperplasia with lower urinary tract symptoms 09/23/2020   Bradykinesia    Cervical spondylosis without myelopathy 12/18/2019   Chronic back pain    Chronic sinusitis    Degeneration of lumbar intervertebral disc 08/20/2020   ED (erectile dysfunction)    Enlarged prostate 12/15/2020   Essential hypertension    nuclear test in epic 01-16-2020 normal perfusion no ischemia, normal lvf and wall motion, nuclear ef 65%    Family history of hemochromatosis    Fatigue    Gastroesophageal reflux disease without esophagitis 12/18/2019   Generalized weakness    Glaucoma, both eyes    Hardening of the aorta (main artery of the heart)    Hemorrhoids    History of syncope    cardiology evaluation by [name], note in epic 12-26-2019, work-up includes event monitor 02-04-2020( showed SR with occ PAC & PVC rare PAT)/ echo  12-26-2019 (mild LVH, ef 50-55%, moderate MR, mild to moderate AV sclerosis no stenosis, ascending aorta 39mm) and nuclear test 01-16-2020 (normal perfusion w/ normal LV and wall motion, nuclear ef 65%)   Hyperglycemia 12/18/2019   Hypogonadism in male    Leukocytosis 09/29/2020   Macular degeneration, right eye    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   Mixed hyperlipidemia    Onychomycosis 12/18/2019   Osteoarthritis of knee 12/18/2019   Pain in hand 12/18/2019   Parkinson's disease 08/2020   Renal stone 12/18/2019   Sebaceous cyst 12/18/2019   Seizure-like activity 09/29/2020   Spinal stenosis of lumbar region 08/20/2020   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/2019   Toxic effect of venom of wasps 12/18/2019   Vitamin B12 deficiency    Vitamin D deficiency      Medicines Meds ordered this encounter  Medications   DISCONTD: carbidopa-levodopa (SINEMET IR) 25-100 MG per tablet immediate release 2 tablet   carbidopa-levodopa (SINEMET IR) 25-100 MG per tablet immediate release 2 tablet    I have reviewed the patients home medicines and have made adjustments as needed  Problem List / ED Course: Problem List Items Addressed This Visit   None        {Document critical care time when appropriate:1} {Document review of labs and clinical decision tools ie heart score, Chads2Vasc2 etc:1}  {Document your independent review of radiology images, and any outside records:1} {Document your discussion with family members, caretakers, and with consultants:1} {Document social  determinants of health affecting pt's care:1} {Document your decision making why or why not admission, treatments were needed:1}  This note was created using dictation software, which may contain spelling or grammatical errors.

## 2022-12-30 ENCOUNTER — Observation Stay (HOSPITAL_BASED_OUTPATIENT_CLINIC_OR_DEPARTMENT_OTHER): Payer: Medicare Other

## 2022-12-30 ENCOUNTER — Observation Stay (HOSPITAL_COMMUNITY): Payer: Medicare Other

## 2022-12-30 DIAGNOSIS — R2990 Unspecified symptoms and signs involving the nervous system: Secondary | ICD-10-CM | POA: Diagnosis present

## 2022-12-30 DIAGNOSIS — I951 Orthostatic hypotension: Secondary | ICD-10-CM | POA: Diagnosis not present

## 2022-12-30 DIAGNOSIS — I6501 Occlusion and stenosis of right vertebral artery: Secondary | ICD-10-CM | POA: Diagnosis not present

## 2022-12-30 DIAGNOSIS — I6621 Occlusion and stenosis of right posterior cerebral artery: Secondary | ICD-10-CM | POA: Diagnosis not present

## 2022-12-30 DIAGNOSIS — G319 Degenerative disease of nervous system, unspecified: Secondary | ICD-10-CM | POA: Diagnosis not present

## 2022-12-30 DIAGNOSIS — G459 Transient cerebral ischemic attack, unspecified: Secondary | ICD-10-CM | POA: Diagnosis not present

## 2022-12-30 DIAGNOSIS — R55 Syncope and collapse: Secondary | ICD-10-CM

## 2022-12-30 DIAGNOSIS — R4182 Altered mental status, unspecified: Secondary | ICD-10-CM | POA: Diagnosis not present

## 2022-12-30 DIAGNOSIS — R42 Dizziness and giddiness: Secondary | ICD-10-CM

## 2022-12-30 DIAGNOSIS — I6602 Occlusion and stenosis of left middle cerebral artery: Secondary | ICD-10-CM | POA: Diagnosis not present

## 2022-12-30 DIAGNOSIS — I160 Hypertensive urgency: Secondary | ICD-10-CM | POA: Diagnosis not present

## 2022-12-30 DIAGNOSIS — R29818 Other symptoms and signs involving the nervous system: Secondary | ICD-10-CM | POA: Diagnosis not present

## 2022-12-30 LAB — LIPID PANEL
Cholesterol: 151 mg/dL (ref 0–200)
HDL: 48 mg/dL (ref >40)
LDL Cholesterol: 87 mg/dL (ref 0–99)
Total CHOL/HDL Ratio: 3.1 RATIO
Triglycerides: 79 mg/dL (ref <150)
VLDL: 16 mg/dL (ref 0–40)

## 2022-12-30 LAB — ECHOCARDIOGRAM COMPLETE
AR max vel: 2.63 cm2
AV Area VTI: 2.45 cm2
AV Area mean vel: 2.58 cm2
AV Mean grad: 4.6 mmHg
AV Peak grad: 9.2 mmHg
Ao pk vel: 1.52 m/s
Area-P 1/2: 2.91 cm2
Height: 71 in
S' Lateral: 3.2 cm
Weight: 2960 oz

## 2022-12-30 LAB — CBC
HCT: 38.9 % — ABNORMAL LOW (ref 39.0–52.0)
Hemoglobin: 13.5 g/dL (ref 13.0–17.0)
MCH: 31.5 pg (ref 26.0–34.0)
MCHC: 34.7 g/dL (ref 30.0–36.0)
MCV: 90.7 fL (ref 80.0–100.0)
Platelets: 159 10*3/uL (ref 150–400)
RBC: 4.29 MIL/uL (ref 4.22–5.81)
RDW: 12.7 % (ref 11.5–15.5)
WBC: 8.6 10*3/uL (ref 4.0–10.5)
nRBC: 0 % (ref 0.0–0.2)

## 2022-12-30 LAB — BASIC METABOLIC PANEL
Anion gap: 14 (ref 5–15)
BUN: 21 mg/dL (ref 8–23)
CO2: 23 mmol/L (ref 22–32)
Calcium: 9.3 mg/dL (ref 8.9–10.3)
Chloride: 101 mmol/L (ref 98–111)
Creatinine, Ser: 0.9 mg/dL (ref 0.61–1.24)
GFR, Estimated: >60 mL/min (ref >60)
Glucose, Bld: 96 mg/dL (ref 70–99)
Potassium: 3.5 mmol/L (ref 3.5–5.1)
Sodium: 138 mmol/L (ref 135–145)

## 2022-12-30 LAB — HEMOGLOBIN A1C
Hgb A1c MFr Bld: 5.4 % (ref 4.8–5.6)
Mean Plasma Glucose: 108.28 mg/dL

## 2022-12-30 MED ORDER — STROKE: EARLY STAGES OF RECOVERY BOOK
Freq: Once | Status: AC
  Filled 2022-12-30: qty 1

## 2022-12-30 MED ORDER — SENNOSIDES-DOCUSATE SODIUM 8.6-50 MG PO TABS: 1.0000 | ORAL_TABLET | Freq: Every evening | ORAL | Status: DC | PRN

## 2022-12-30 MED ORDER — POTASSIUM CHLORIDE CRYS ER 20 MEQ PO TBCR
40.0000 meq | EXTENDED_RELEASE_TABLET | Freq: Once | ORAL | Status: AC
  Administered 2022-12-30: 40 meq via ORAL
  Filled 2022-12-30: qty 2

## 2022-12-30 MED ORDER — ENOXAPARIN SODIUM 40 MG/0.4ML IJ SOSY
40.0000 mg | PREFILLED_SYRINGE | INTRAMUSCULAR | Status: DC
  Administered 2022-12-30 – 2023-01-01 (×3): 40 mg via SUBCUTANEOUS
  Filled 2022-12-30 (×3): qty 0.4

## 2022-12-30 MED ORDER — GADOBUTROL 1 MMOL/ML IV SOLN
9.0000 mL | Freq: Once | INTRAVENOUS | Status: AC | PRN
  Administered 2022-12-30: 9 mL via INTRAVENOUS

## 2022-12-30 MED ORDER — ACETAMINOPHEN 325 MG PO TABS
650.0000 mg | ORAL_TABLET | ORAL | Status: DC | PRN
  Administered 2022-12-30 – 2022-12-31 (×2): 650 mg via ORAL
  Filled 2022-12-30 (×2): qty 2

## 2022-12-30 MED ORDER — DORZOLAMIDE HCL-TIMOLOL MAL 2-0.5 % OP SOLN
1.0000 [drp] | Freq: Two times a day (BID) | OPHTHALMIC | Status: DC
  Administered 2022-12-30 – 2023-01-01 (×4): 1 [drp] via OPHTHALMIC
  Filled 2022-12-30: qty 10

## 2022-12-30 MED ORDER — ONDANSETRON HCL 4 MG/2ML IJ SOLN: 4.0000 mg | Freq: Four times a day (QID) | INTRAMUSCULAR | Status: DC | PRN

## 2022-12-30 MED ORDER — LATANOPROST 0.005 % OP SOLN
1.0000 [drp] | Freq: Every day | OPHTHALMIC | Status: DC
  Administered 2022-12-30 – 2022-12-31 (×2): 1 [drp] via OPHTHALMIC
  Filled 2022-12-30 (×2): qty 2.5

## 2022-12-30 MED ORDER — ACETAMINOPHEN 160 MG/5ML PO SOLN: 650.0000 mg | ORAL | Status: DC | PRN

## 2022-12-30 MED ORDER — FLUDROCORTISONE ACETATE 0.1 MG PO TABS
0.1000 mg | ORAL_TABLET | Freq: Every day | ORAL | Status: DC
  Administered 2022-12-30 – 2023-01-01 (×3): 0.1 mg via ORAL
  Filled 2022-12-30 (×3): qty 1

## 2022-12-30 MED ORDER — ASPIRIN 81 MG PO TBEC
81.0000 mg | DELAYED_RELEASE_TABLET | Freq: Every day | ORAL | Status: DC
  Administered 2022-12-30 – 2023-01-01 (×3): 81 mg via ORAL
  Filled 2022-12-30 (×3): qty 1

## 2022-12-30 MED ORDER — ACETAMINOPHEN 650 MG RE SUPP: 650.0000 mg | RECTAL | Status: DC | PRN

## 2022-12-30 MED ORDER — HYDRALAZINE HCL 20 MG/ML IJ SOLN
10.0000 mg | INTRAMUSCULAR | Status: DC | PRN
  Administered 2022-12-30: 10 mg via INTRAVENOUS
  Filled 2022-12-30 (×2): qty 1

## 2022-12-30 MED ORDER — DONEPEZIL HCL 10 MG PO TABS
5.0000 mg | ORAL_TABLET | Freq: Every day | ORAL | Status: DC
  Administered 2022-12-30 – 2022-12-31 (×3): 5 mg via ORAL
  Filled 2022-12-30 (×3): qty 1

## 2022-12-30 MED ORDER — ROSUVASTATIN CALCIUM 5 MG PO TABS
10.0000 mg | ORAL_TABLET | Freq: Every day | ORAL | Status: DC
  Administered 2022-12-30 – 2023-01-01 (×3): 10 mg via ORAL
  Filled 2022-12-30 (×3): qty 2

## 2022-12-30 NOTE — Progress Notes (Addendum)
STROKE TEAM PROGRESS NOTE   INTERVAL HISTORY Patient is seen in his room with 2 family members at the bedside.  Yesterday, he had an episode where he first became confused and started talking about taking his truck for maintenance when he does not drive, then stood up, froze,, began having generalized trembling movements, had diminished left facial movement and difficulty speaking for about 10 minutes.  Patient does have a history of TIA in January with similar symptoms.  Also, 2 years ago, he had a possible seizure, after which she was unresponsive for about 30 minutes.  Vitals:   12/30/22 0729 12/30/22 0836 12/30/22 0900 12/30/22 1155  BP: (!) 142/66 (!) 88/54 (!) 143/75 (!) 155/80  Pulse:  69 67 64  Resp:  18 20 20   Temp:  97.7 F (36.5 C)  97.9 F (36.6 C)  TempSrc:  Oral  Oral  SpO2:  100% 100% 98%  Weight:      Height:       CBC:  Recent Labs  Lab 12/29/22 1629 12/29/22 1649 12/30/22 0323  WBC 8.0  --  8.6  NEUTROABS 6.0  --   --   HGB 13.1 12.6* 13.5  HCT 38.6* 37.0* 38.9*  MCV 92.8  --  90.7  PLT 166  --  159   Basic Metabolic Panel:  Recent Labs  Lab 12/29/22 1629 12/29/22 1649 12/30/22 0323  NA 139 139 138  K 4.1 4.2 3.5  CL 102 101 101  CO2 28  --  23  GLUCOSE 137* 135* 96  BUN 24* 26* 21  CREATININE 1.11 1.10 0.90  CALCIUM 9.5  --  9.3   Lipid Panel:  Recent Labs  Lab 12/30/22 0323  CHOL 151  TRIG 79  HDL 48  CHOLHDL 3.1  VLDL 16  LDLCALC 87   HgbA1c:  Recent Labs  Lab 12/30/22 0323  HGBA1C 5.4   Urine Drug Screen: No results for input(s): "LABOPIA", "COCAINSCRNUR", "LABBENZ", "AMPHETMU", "THCU", "LABBARB" in the last 168 hours.  Alcohol Level  Recent Labs  Lab 12/29/22 1629  ETH <10    IMAGING past 24 hours MR ANGIO HEAD WO CONTRAST  Result Date: 12/30/2022 CLINICAL DATA:  Initial evaluation for TIA, mental status change. EXAM: MRI HEAD WITHOUT CONTRAST MRA HEAD WITHOUT CONTRAST MRA NECK WITHOUT AND WITH CONTRAST TECHNIQUE:  Multiplanar, multi-echo pulse sequences of the brain and surrounding structures were acquired without intravenous contrast. Angiographic images of the Circle of Willis were acquired using MRA technique without intravenous contrast. Angiographic images of the neck were acquired using MRA technique without and with intravenous contrast. Carotid stenosis measurements (when applicable) are obtained utilizing NASCET criteria, using the distal internal carotid diameter as the denominator. CONTRAST:  9mL GADAVIST GADOBUTROL 1 MMOL/ML IV SOLN COMPARISON:  Prior CT from 12/29/2022. FINDINGS: MRI HEAD FINDINGS Brain: Mild age-related cerebral atrophy. Patchy T2/FLAIR hyperintensity involving the periventricular and deep white matter both cerebral hemispheres as well as the pons, consistent with chronic small vessel ischemic disease, mild for age. Few small remote left cerebellar infarcts noted. No evidence for acute or subacute ischemia. Gray-white matter differentiation maintained. No other areas of chronic cortical infarction. No acute intracranial hemorrhage. Subtle siderosis noted about the cerebellar vermis, suggesting prior subarachnoid hemorrhage. Few additional punctate chronic micro hemorrhages noted within the right cerebellum and right temporal lobe. No mass lesion, midline shift or mass effect no hydrocephalus or extra-axial fluid collection. Pituitary gland and suprasellar region within normal limits. Vascular: Loss of normal flow void  within the left vertebral artery, consistent with slow flow and/or occlusion. Major intracranial vascular flow voids are otherwise maintained. Skull and upper cervical spine: Craniocervical junction within normal limits. Bone marrow signal intensity normal. No scalp soft tissue abnormality. Sinuses/Orbits: Prior bilateral ocular lens replacement. Scattered mucosal thickening noted about the ethmoidal air cells. Paranasal sinuses are otherwise largely clear. Moderate left mastoid  effusion, of uncertain significance. Other: None. MRA HEAD FINDINGS Anterior circulation: Both internal carotid arteries are patent through the siphons without significant stenosis or other abnormality. A1 segments patent bilaterally. Normal anterior communicating complex. Anterior cerebral arteries patent without stenosis. No significant M1 stenosis. Left M1 bifurcates early. Focal moderate proximal left M2 stenosis, superior division (series 10 20, image 1). Focal severe proximal right M2 stenosis, inferior division (series 10 20, image 1). MCA branches are otherwise patent and well perfused, although demonstrates small vessel atheromatous irregularity. Posterior circulation: Right vertebral artery dominant and widely patent as it courses into the cranial vault. Mild stenosis noted just prior to the vertebrobasilar junction. Left V4 segment is hypoplastic and not well seen on this portion of the exam. On corresponding MRA of the neck, the left vertebral artery appears to be patent to the skull base. Attenuated flow within the proximal left V4 segment with some perfusion of the left PICA. Left V4 segment occluded distally. Neither PICA is well visualized. Basilar mildly irregular but patent without significant stenosis. Superior cerebral arteries patent bilaterally. Both PCAs primarily supplied via the basilar. Short-segment moderate right and severe left proximal P2 stenoses (series 1010, image 6). PCAs are irregular but remain patent to their distal aspects. Anatomic variants: As above.  No aneurysm. MRA NECK FINDINGS Aortic arch: Visualized aortic arch normal caliber with standard branch pattern. No stenosis about the origin the great vessels. Right carotid system: Right common and internal carotid arteries are patent without evidence for dissection. Minor for age atheromatous irregularity about the right carotid bulb without hemodynamically significant stenosis. Left carotid system: Left common and internal  carotid arteries are patent without evidence for dissection. Mild for age atheromatous irregularity about the left carotid bulb without hemodynamically significant stenosis. Vertebral arteries: Both vertebral arteries arise from subclavian arteries. The proximal left subclavian artery is incompletely visualized on this exam. Vertebral arteries M cells are patent within the neck without stenosis or evidence for dissection. Other: None IMPRESSION: MRI HEAD IMPRESSION: 1. No acute intracranial infarct or other abnormality. 2. Mild age-related cerebral atrophy with chronic small vessel ischemic disease, with a few small remote left cerebellar infarcts. 3. Subtle siderosis about the cerebellar vermis, suggesting prior subarachnoid hemorrhage. MRA HEAD IMPRESSION: 1. Negative intracranial MRA for acute large vessel occlusion. 2. Occlusion of the intradural left V4 segment beyond the takeoff of the left PICA, presumably chronic in nature given the lack of acute ischemia. 3. Intracranial atherosclerotic disease with associated moderate to severe bilateral M2 and P2 stenoses as above. MRA NECK IMPRESSION: Wide patency of both carotid artery systems and vertebral arteries within the neck. No hemodynamically significant stenosis or other acute vascular abnormality. Electronically Signed   By: Rise Mu M.D.   On: 12/30/2022 01:35   MR BRAIN WO CONTRAST  Result Date: 12/30/2022 CLINICAL DATA:  Initial evaluation for TIA, mental status change. EXAM: MRI HEAD WITHOUT CONTRAST MRA HEAD WITHOUT CONTRAST MRA NECK WITHOUT AND WITH CONTRAST TECHNIQUE: Multiplanar, multi-echo pulse sequences of the brain and surrounding structures were acquired without intravenous contrast. Angiographic images of the Circle of Willis were acquired using MRA technique  without intravenous contrast. Angiographic images of the neck were acquired using MRA technique without and with intravenous contrast. Carotid stenosis measurements (when  applicable) are obtained utilizing NASCET criteria, using the distal internal carotid diameter as the denominator. CONTRAST:  9mL GADAVIST GADOBUTROL 1 MMOL/ML IV SOLN COMPARISON:  Prior CT from 12/29/2022. FINDINGS: MRI HEAD FINDINGS Brain: Mild age-related cerebral atrophy. Patchy T2/FLAIR hyperintensity involving the periventricular and deep white matter both cerebral hemispheres as well as the pons, consistent with chronic small vessel ischemic disease, mild for age. Few small remote left cerebellar infarcts noted. No evidence for acute or subacute ischemia. Gray-white matter differentiation maintained. No other areas of chronic cortical infarction. No acute intracranial hemorrhage. Subtle siderosis noted about the cerebellar vermis, suggesting prior subarachnoid hemorrhage. Few additional punctate chronic micro hemorrhages noted within the right cerebellum and right temporal lobe. No mass lesion, midline shift or mass effect no hydrocephalus or extra-axial fluid collection. Pituitary gland and suprasellar region within normal limits. Vascular: Loss of normal flow void within the left vertebral artery, consistent with slow flow and/or occlusion. Major intracranial vascular flow voids are otherwise maintained. Skull and upper cervical spine: Craniocervical junction within normal limits. Bone marrow signal intensity normal. No scalp soft tissue abnormality. Sinuses/Orbits: Prior bilateral ocular lens replacement. Scattered mucosal thickening noted about the ethmoidal air cells. Paranasal sinuses are otherwise largely clear. Moderate left mastoid effusion, of uncertain significance. Other: None. MRA HEAD FINDINGS Anterior circulation: Both internal carotid arteries are patent through the siphons without significant stenosis or other abnormality. A1 segments patent bilaterally. Normal anterior communicating complex. Anterior cerebral arteries patent without stenosis. No significant M1 stenosis. Left M1 bifurcates  early. Focal moderate proximal left M2 stenosis, superior division (series 10 20, image 1). Focal severe proximal right M2 stenosis, inferior division (series 10 20, image 1). MCA branches are otherwise patent and well perfused, although demonstrates small vessel atheromatous irregularity. Posterior circulation: Right vertebral artery dominant and widely patent as it courses into the cranial vault. Mild stenosis noted just prior to the vertebrobasilar junction. Left V4 segment is hypoplastic and not well seen on this portion of the exam. On corresponding MRA of the neck, the left vertebral artery appears to be patent to the skull base. Attenuated flow within the proximal left V4 segment with some perfusion of the left PICA. Left V4 segment occluded distally. Neither PICA is well visualized. Basilar mildly irregular but patent without significant stenosis. Superior cerebral arteries patent bilaterally. Both PCAs primarily supplied via the basilar. Short-segment moderate right and severe left proximal P2 stenoses (series 1010, image 6). PCAs are irregular but remain patent to their distal aspects. Anatomic variants: As above.  No aneurysm. MRA NECK FINDINGS Aortic arch: Visualized aortic arch normal caliber with standard branch pattern. No stenosis about the origin the great vessels. Right carotid system: Right common and internal carotid arteries are patent without evidence for dissection. Minor for age atheromatous irregularity about the right carotid bulb without hemodynamically significant stenosis. Left carotid system: Left common and internal carotid arteries are patent without evidence for dissection. Mild for age atheromatous irregularity about the left carotid bulb without hemodynamically significant stenosis. Vertebral arteries: Both vertebral arteries arise from subclavian arteries. The proximal left subclavian artery is incompletely visualized on this exam. Vertebral arteries M cells are patent within the  neck without stenosis or evidence for dissection. Other: None IMPRESSION: MRI HEAD IMPRESSION: 1. No acute intracranial infarct or other abnormality. 2. Mild age-related cerebral atrophy with chronic small vessel ischemic disease, with  a few small remote left cerebellar infarcts. 3. Subtle siderosis about the cerebellar vermis, suggesting prior subarachnoid hemorrhage. MRA HEAD IMPRESSION: 1. Negative intracranial MRA for acute large vessel occlusion. 2. Occlusion of the intradural left V4 segment beyond the takeoff of the left PICA, presumably chronic in nature given the lack of acute ischemia. 3. Intracranial atherosclerotic disease with associated moderate to severe bilateral M2 and P2 stenoses as above. MRA NECK IMPRESSION: Wide patency of both carotid artery systems and vertebral arteries within the neck. No hemodynamically significant stenosis or other acute vascular abnormality. Electronically Signed   By: Rise Mu M.D.   On: 12/30/2022 01:35   MR Angiogram Neck W or Wo Contrast  Result Date: 12/30/2022 CLINICAL DATA:  Initial evaluation for TIA, mental status change. EXAM: MRI HEAD WITHOUT CONTRAST MRA HEAD WITHOUT CONTRAST MRA NECK WITHOUT AND WITH CONTRAST TECHNIQUE: Multiplanar, multi-echo pulse sequences of the brain and surrounding structures were acquired without intravenous contrast. Angiographic images of the Circle of Willis were acquired using MRA technique without intravenous contrast. Angiographic images of the neck were acquired using MRA technique without and with intravenous contrast. Carotid stenosis measurements (when applicable) are obtained utilizing NASCET criteria, using the distal internal carotid diameter as the denominator. CONTRAST:  9mL GADAVIST GADOBUTROL 1 MMOL/ML IV SOLN COMPARISON:  Prior CT from 12/29/2022. FINDINGS: MRI HEAD FINDINGS Brain: Mild age-related cerebral atrophy. Patchy T2/FLAIR hyperintensity involving the periventricular and deep white matter both  cerebral hemispheres as well as the pons, consistent with chronic small vessel ischemic disease, mild for age. Few small remote left cerebellar infarcts noted. No evidence for acute or subacute ischemia. Gray-white matter differentiation maintained. No other areas of chronic cortical infarction. No acute intracranial hemorrhage. Subtle siderosis noted about the cerebellar vermis, suggesting prior subarachnoid hemorrhage. Few additional punctate chronic micro hemorrhages noted within the right cerebellum and right temporal lobe. No mass lesion, midline shift or mass effect no hydrocephalus or extra-axial fluid collection. Pituitary gland and suprasellar region within normal limits. Vascular: Loss of normal flow void within the left vertebral artery, consistent with slow flow and/or occlusion. Major intracranial vascular flow voids are otherwise maintained. Skull and upper cervical spine: Craniocervical junction within normal limits. Bone marrow signal intensity normal. No scalp soft tissue abnormality. Sinuses/Orbits: Prior bilateral ocular lens replacement. Scattered mucosal thickening noted about the ethmoidal air cells. Paranasal sinuses are otherwise largely clear. Moderate left mastoid effusion, of uncertain significance. Other: None. MRA HEAD FINDINGS Anterior circulation: Both internal carotid arteries are patent through the siphons without significant stenosis or other abnormality. A1 segments patent bilaterally. Normal anterior communicating complex. Anterior cerebral arteries patent without stenosis. No significant M1 stenosis. Left M1 bifurcates early. Focal moderate proximal left M2 stenosis, superior division (series 10 20, image 1). Focal severe proximal right M2 stenosis, inferior division (series 10 20, image 1). MCA branches are otherwise patent and well perfused, although demonstrates small vessel atheromatous irregularity. Posterior circulation: Right vertebral artery dominant and widely patent as  it courses into the cranial vault. Mild stenosis noted just prior to the vertebrobasilar junction. Left V4 segment is hypoplastic and not well seen on this portion of the exam. On corresponding MRA of the neck, the left vertebral artery appears to be patent to the skull base. Attenuated flow within the proximal left V4 segment with some perfusion of the left PICA. Left V4 segment occluded distally. Neither PICA is well visualized. Basilar mildly irregular but patent without significant stenosis. Superior cerebral arteries patent bilaterally. Both PCAs primarily  supplied via the basilar. Short-segment moderate right and severe left proximal P2 stenoses (series 1010, image 6). PCAs are irregular but remain patent to their distal aspects. Anatomic variants: As above.  No aneurysm. MRA NECK FINDINGS Aortic arch: Visualized aortic arch normal caliber with standard branch pattern. No stenosis about the origin the great vessels. Right carotid system: Right common and internal carotid arteries are patent without evidence for dissection. Minor for age atheromatous irregularity about the right carotid bulb without hemodynamically significant stenosis. Left carotid system: Left common and internal carotid arteries are patent without evidence for dissection. Mild for age atheromatous irregularity about the left carotid bulb without hemodynamically significant stenosis. Vertebral arteries: Both vertebral arteries arise from subclavian arteries. The proximal left subclavian artery is incompletely visualized on this exam. Vertebral arteries M cells are patent within the neck without stenosis or evidence for dissection. Other: None IMPRESSION: MRI HEAD IMPRESSION: 1. No acute intracranial infarct or other abnormality. 2. Mild age-related cerebral atrophy with chronic small vessel ischemic disease, with a few small remote left cerebellar infarcts. 3. Subtle siderosis about the cerebellar vermis, suggesting prior subarachnoid  hemorrhage. MRA HEAD IMPRESSION: 1. Negative intracranial MRA for acute large vessel occlusion. 2. Occlusion of the intradural left V4 segment beyond the takeoff of the left PICA, presumably chronic in nature given the lack of acute ischemia. 3. Intracranial atherosclerotic disease with associated moderate to severe bilateral M2 and P2 stenoses as above. MRA NECK IMPRESSION: Wide patency of both carotid artery systems and vertebral arteries within the neck. No hemodynamically significant stenosis or other acute vascular abnormality. Electronically Signed   By: Rise Mu M.D.   On: 12/30/2022 01:35   CT HEAD WO CONTRAST  Result Date: 12/29/2022 CLINICAL DATA:  Transient ischemic attack. EXAM: CT HEAD WITHOUT CONTRAST TECHNIQUE: Contiguous axial images were obtained from the base of the skull through the vertex without intravenous contrast. RADIATION DOSE REDUCTION: This exam was performed according to the departmental dose-optimization program which includes automated exposure control, adjustment of the mA and/or kV according to patient size and/or use of iterative reconstruction technique. COMPARISON:  09/29/2020. FINDINGS: Brain: There is no evidence for acute hemorrhage, hydrocephalus, mass lesion, or abnormal extra-axial fluid collection. No definite CT evidence for acute infarction. Diffuse loss of parenchymal volume is consistent with atrophy. Patchy low attenuation in the deep hemispheric and periventricular white matter is nonspecific, but likely reflects chronic microvascular ischemic demyelination. Chronic lacunar infarcts are seen in the basal ganglia bilaterally. Vascular: No hyperdense vessel or unexpected calcification. Skull: No evidence for fracture. No worrisome lytic or sclerotic lesion. Sinuses/Orbits: Left mastoid effusion is new in the interval. Right mastoid air cells are clear. Paranasal sinuses are clear. Visualized portions of the globes and intraorbital fat are unremarkable.  Other: None. IMPRESSION: 1. Left mastoid effusion.  New since prior study. 2. No acute intracranial abnormality. 3. Atrophy with chronic small vessel white matter ischemic disease. Electronically Signed   By: Kennith Center M.D.   On: 12/29/2022 18:12    PHYSICAL EXAM General: Alert, well-nourished, well-developed elderly patient in no acute distress Respiratory: Regular, unlabored respirations on room air  NEURO:  Mental Status: AA&Ox2 (gets month wrong) Speech/Language: speech is without dysarthria or aphasia.    Cranial Nerves:  II: PERRL.  III, IV, VI: EOMI. Eyelids elevate symmetrically.  V: Sensation is intact to light touch and symmetrical to face.  VII: Smile is symmetrical.  VIII: hearing intact to voice. IX, X: Voice is slightly hypophonic [id number] shrug 5/5.  XII: tongue is midline without fasciculations. Motor: 5/5 strength to all muscle groups tested.  Tone: is normal and bulk is normal Sensation- Intact to light touch bilaterally.  Coordination: FTN intact bilaterally.No drift.  Gait- deferred  ASSESSMENT/PLAN Mr. WHIT TONES is a 82 y.o. male with history of Parkinson's disease with neurogenic orthostatic hypotension, TIA and BPH presenting with an episode where he first became confused and started talking about taking his truck for maintenance when he does not drive, then stood up, froze,, began having generalized trembling movements, had diminished left facial movement and difficulty speaking for about 10 minutes.  Patient does have a history of TIA in January with similar symptoms.  Also, 2 years ago, he had a possible seizure, after which she was unresponsive for about 30 minutes.  This morning, orthostatic vitals were taken, and patient was noted to be quite orthostatic with systolic blood pressure dropping from the 140s down to 88 when he stood up  Likely presyncope related to orthostatic hypotension, DDx including seizure and TIA CT head No acute abnormality.  Small vessel disease. Atrophy.  MRI no acute abnormality, atrophy and chronic small vessel ischemic disease with a few remote left cerebellar infarcts MRA no emergent LVO but occlusion of intradural left V4 segment, appears chronic, bilateral moderate to severe M2 and P2 stenosis 2D Echo EF 60 to 65% EEG mild diffuse slowing, no seizure LDL 87 HgbA1c 5.4 VTE prophylaxis -Lovenox aspirin 81 mg daily prior to admission, now on aspirin 81 mg daily.  Continue on discharge Therapy recommendations: Outpatient PT OT Disposition: Pending  Neurogenic orthostatic hypotension Parkinson disease Parkinson dementia Patient has history of neurogenic orthostatic hypotension secondary to Parkinson's disease Was on midodrine, now on Florinef at home This admission orthostatic vital supine 142/66, sitting 122/62, standing 88/54. Put on TED hose Continue home Florinef and Sinemet and Aricept Continue follow-up with Dr. Fidela Juneau at [facility] consider Northera for PD related orthostatic hypotension as outpatient  Hyperlipidemia Home meds: None LDL 87, goal < 70 Add rosuvastatin 10 mg daily High intensity statin not indicated due to previous intolerance Continue statin at discharge  Other Stroke Risk Factors Advanced Age >/= 42  Hx TIA in 07/2022 at [facility] - per daughter, pt had gradually decline but no specific event.  Patient was on DAPT and then aspirin alone.  Other Active Problems BPH  Hospital day # 0  Cortney E Ernestina Columbia , MSN, AGACNP-BC [facility] See Amion for schedule and pager information 12/30/2022 12:21 PM   ATTENDING NOTE: I reviewed above note and agree with the assessment and plan. Pt was seen and examined.   Wife and daughter at bedside. Pt shuffling gait walked from bathroom to chair with assistance. Pt was able to feed self with lunch. However, bradykinesia.  On exam, patient awake alert and orientated to place, people and year, stated months was in  April.  No focal neurological deficit, however psychomotor slowing, bradykinesia, hypophonia with bradyphonia.  No significant send resting tremor or rigidity.  Earlier today, PT measured orthostatic vitals showed significant orthostatic hypotension.  Patient was on midodrine in the past, currently on Florinef PTA.  Put patient on TED hose and resume Florinef.  Recommend follow-up with Dr. Fidela Juneau at [facility] to consider Northera if needed.  Patient current episode concerning for orthostatic related pre-syncope.  Less likely TIA or seizure.  Continue aspirin, add low-dose Crestor.  PT and OT recommend outpatient.  For detailed assessment and plan, please refer to  above/below as I have made changes wherever appropriate.   Neurology will sign off. Please call with questions. Pt will follow up with Dr. Fidela Juneau at [facility] on 03/05/23 as scheduled. Thanks for the consult.   Marvel Plan, MD PhD Stroke Neurology 12/30/2022 7:01 PM    To contact Stroke Continuity provider, please refer to [email or web address]. After hours, contact General Neurology

## 2022-12-30 NOTE — Evaluation (Signed)
Physical Therapy Evaluation Patient Details Name: Seth Carter MRN: [id number] DOB: [birth year] Today's Date: 12/30/2022  History of Present Illness  82 yo male reports difficulty speaking with abrupt facial droop and unsteady gait for 10 minutes. PMH parkinsons, atrophy and chronic microvascular ischemia, prior syncopal spells AKI bradykinesia, enlarged prostate, glaucoma bil, macular degeneration R eye TIA in 07/2022  Clinical Impression  Pt admitted with above diagnosis. Pt from home with wife where he has been independent without AD, goes to gym to workout with Parkinsons group, wife drives him. On eval with PT/OT pt was dizzy with position changes and was positive for orthostatic hypotension. Pt relays some recent med changes. Pt required min A and RW due to unsteadiness and ambulation could not be tested. Will follow acutely and recommend outpt PT at d/c.  Pt currently with functional limitations due to the deficits listed below (see PT Problem List). Pt will benefit from acute skilled PT to increase their independence and safety with mobility to allow discharge.   BP supine 142/66    Sitting 124/62    Standing 88/54        Recommendations for follow up therapy are one component of a multi-disciplinary discharge planning process, led by the attending physician.  Recommendations may be updated based on patient status, additional functional criteria and insurance authorization.  Follow Up Recommendations       Assistance Recommended at Discharge Frequent or constant Supervision/Assistance  Patient can return home with the following  Help with stairs or ramp for entrance;Assist for transportation;Assistance with cooking/housework;A little help with walking and/or transfers    Equipment Recommendations None recommended by PT  Recommendations for Other Services       Functional Status Assessment Patient has had a recent decline in their functional status and demonstrates the ability to make  significant improvements in function in a reasonable and predictable amount of time.     Precautions / Restrictions Precautions Precautions: Fall Precaution Comments: watch BP Restrictions Weight Bearing Restrictions: No      Mobility  Bed Mobility Overal bed mobility: Needs Assistance Bed Mobility: Rolling, Supine to Sit, Sit to Supine Rolling: Supervision   Supine to sit: Min assist Sit to supine: Min assist   General bed mobility comments: pt with posterior LOB and small input for forward flexion to continue to progress OOB. Min A to LE's for return to supine    Transfers Overall transfer level: Needs assistance Equipment used: Rolling walker (2 wheels) Transfers: Sit to/from Stand Sit to Stand: Min guard           General transfer comment: pt does not use DME at baseline. 2 person min (A) due to weakness reported from RN staff and near fall. Pt reliant on RW in standing today    Ambulation/Gait               General Gait Details: did not progress due to orthostasis  Stairs            Wheelchair Mobility    Modified Rankin (Stroke Patients Only)       Balance Overall balance assessment: Mild deficits observed, not formally tested                                           Pertinent Vitals/Pain Pain Assessment Pain Assessment: Faces Faces Pain Scale: Hurts a little bit Pain Location: eyes  Pain Descriptors / Indicators: Discomfort Pain Intervention(s): Monitored during session    Home Living Family/patient expects to be discharged to:: Private residence Living Arrangements: Spouse/significant other Available Help at Discharge: Family;Available 24 hours/day Type of Home: House Home Access: Stairs to enter Entrance Stairs-Rails: Can reach both Entrance Stairs-Number of Steps: 15 Alternate Level Stairs-Number of Steps: 15 Home Layout: Able to live on main level with bedroom/bathroom;Multi-level (split leve style home  with additional bedrooms, rec room and a workshop on the lower level) Home Equipment: Pharmacist, hospital (2 wheels);BSC/3in1;Grab bars - toilet Additional Comments: dog = Daisy    Prior Function Prior Level of Function : Independent/Modified Independent             Mobility Comments: reports he is not restricted from driving but chooses not to drive, participates in Parkinsons classes at [employer] x2 per week. had a fall in [place] 07/2022 (2 total in the last 6 months) ADLs Comments: indep     Hand Dominance   Dominant Hand: Right    Extremity/Trunk Assessment   Upper Extremity Assessment Upper Extremity Assessment: Defer to OT evaluation    Lower Extremity Assessment Lower Extremity Assessment: RLE deficits/detail RLE Deficits / Details: mild RLE weakness compares to LLE but grossly 4/5 RLE Sensation: WNL RLE Coordination: WNL    Cervical / Trunk Assessment Cervical / Trunk Assessment: Kyphotic  Communication   Communication: Other (comment) (delayed in his word finding but able to communicate needs. Pt reports that his word finding delay is worse than normal.)  Cognition Arousal/Alertness: Awake/alert Behavior During Therapy: WFL for tasks assessed/performed Overall Cognitive Status: Impaired/Different from baseline Area of Impairment: Safety/judgement                         Safety/Judgement: Decreased awareness of deficits     General Comments: decreased verbalization of dizziness sitting EOB until asked        General Comments General comments (skin integrity, edema, etc.): pt dizzy in sitting and standing, significant BP drop in each position, see doc flowsheets. HR in 70's throughout, SPO2 in 90's on RA.    Exercises     Assessment/Plan    PT Assessment Patient needs continued PT services  PT Problem List Decreased strength;Decreased activity tolerance;Decreased balance;Decreased mobility;Decreased  cognition;Decreased safety awareness;Decreased knowledge of precautions;Decreased knowledge of use of DME;Pain;Cardiopulmonary status limiting activity       PT Treatment Interventions DME instruction;Gait training;Stair training;Functional mobility training;Therapeutic activities;Therapeutic exercise;Balance training;Neuromuscular re-education;Cognitive remediation;Patient/family education    PT Goals (Current goals can be found in the Care Plan section)  Acute Rehab PT Goals Patient Stated Goal: return to home PT Goal Formulation: With patient Time For Goal Achievement: 01/13/23 Potential to Achieve Goals: Good    Frequency Min 3X/week     Carter-evaluation PT/OT/SLP Carter-Evaluation/Treatment: Yes Reason for Carter-Treatment: To address functional/ADL transfers;For patient/therapist safety PT goals addressed during session: Mobility/safety with mobility;Balance;Proper use of DME OT goals addressed during session: ADL's and self-care       AM-PAC PT "6 Clicks" Mobility  Outcome Measure Help needed turning from your back to your side while in a flat bed without using bedrails?: A Little Help needed moving from lying on your back to sitting on the side of a flat bed without using bedrails?: A Little Help needed moving to and from a bed to a chair (including a wheelchair)?: A Little Help needed standing up from a chair using your arms (e.g., wheelchair  or bedside chair)?: A Little Help needed to walk in hospital room?: Total Help needed climbing 3-5 steps with a railing? : Total 6 Click Score: 14    End of Session Equipment Utilized During Treatment: Gait belt Activity Tolerance: Treatment limited secondary to medical complications (Comment) (orthostatic hypotension) Patient left: in bed;with call bell/phone within reach Nurse Communication: Mobility status PT Visit Diagnosis: Unsteadiness on feet (R26.81);History of falling (Z91.81);Muscle weakness (generalized) (M62.81);Pain Pain -  part of body:  (eyes)    Time: 4098-1191 PT Time Calculation (min) (ACUTE ONLY): 22 min   Charges:   PT Evaluation $PT Eval Moderate Complexity: 1 Mod          Seth Carter, PT  Acute Rehab Services Secure chat preferred Office [phone]   Seth Carter 12/30/2022, 10:44 AM

## 2022-12-30 NOTE — Procedures (Signed)
Routine EEG Report  Seth Carter is a 82 y.o. male with a history of altered mental status  who is undergoing an EEG to evaluate for seizures.  Report: This EEG was acquired with electrodes placed according to the International 10-20 electrode system (including Fp1, Fp2, F3, F4, C3, C4, P3, P4, O1, O2, T3, T4, T5, T6, A1, A2, Fz, Cz, Pz). The following electrodes were missing or displaced: none.  The occipital dominant rhythm was 7 Hz. This activity is reactive to stimulation. Drowsiness was manifested by background fragmentation; deeper stages of sleep were identified by K complexes and sleep spindles. There was no focal slowing. There were no interictal epileptiform discharges. There were no electrographic seizures identified. There was no abnormal response to photic stimulation or hyperventilation.   Impression and clinical correlation: This EEG was obtained while awake and asleep and is abnormal due to mild diffuse slowing indicative of global cerebral dysfunction. Epileptiform abnormalities were not seen during this recording.  Bing Neighbors, MD [facility] [phone]  If 7pm- 7am, please page neurology on call as listed in AMION.

## 2022-12-30 NOTE — Consult Note (Addendum)
Neurology Consultation Reason for Consult: Transient episode of aphasia Referring Physician: Herbert Pun  CC: Episode of difficulty speaking  History is obtained from: Patient  HPI: Seth Carter is a 82 y.o. male who reports that he has had some difficulty speaking for quite a while, but had an abrupt onset of facial droop, difficulty speaking, unsteady gait that lasted for about 10 minutes.  He states that he remembers the entire episode, and could understand everything people were saying, and thinks that he got out some words correctly but many words were not correct.  He has never had anything like this happen before.  He did not notice any facial weakness, numbness, or other findings.    He does have a history of Parkinson's disease, managed on Sinemet 3 times daily.   Past Medical History:  Diagnosis Date   Acute pharyngitis    AKI (acute kidney injury) 09/29/2020   Allergic rhinitis    Benign prostatic hyperplasia with lower urinary tract symptoms 09/23/2020   Bradykinesia    Cervical spondylosis without myelopathy 12/18/2019   Chronic back pain    Chronic sinusitis    Degeneration of lumbar intervertebral disc 08/20/2020   ED (erectile dysfunction)    Enlarged prostate 12/15/2020   Essential hypertension    nuclear test in epic 01-16-2020 normal perfusion no ischemia, normal lvf and wall motion, nuclear ef 65%   Family history of hemochromatosis    Fatigue    Gastroesophageal reflux disease without esophagitis 12/18/2019   Generalized weakness    Glaucoma, both eyes    Hardening of the aorta (main artery of the heart)    Hemorrhoids    History of syncope    cardiology evaluation by [name], note in epic 12-26-2019, work-up includes event monitor 02-04-2020( showed SR with occ PAC & PVC rare PAT)/ echo  12-26-2019 (mild LVH, ef 50-55%, moderate MR, mild to moderate AV sclerosis no stenosis, ascending aorta 39mm) and nuclear test 01-16-2020 (normal perfusion w/ normal LV and wall  motion, nuclear ef 65%)   Hyperglycemia 12/18/2019   Hypogonadism in male    Leukocytosis 09/29/2020   Macular degeneration, right eye    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   Mixed hyperlipidemia    Onychomycosis 12/18/2019   Osteoarthritis of knee 12/18/2019   Pain in hand 12/18/2019   Parkinson's disease 08/2020   Renal stone 12/18/2019   Sebaceous cyst 12/18/2019   Seizure-like activity 09/29/2020   Spinal stenosis of lumbar region 08/20/2020   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 He has never used smokeless tobacco. He reports current alcohol use of about 5.0 - 6.0 standard drinks of alcohol per week. He reports that he does not use drugs.   Exam: Current vital signs: BP (!) 222/79 (BP Location: Right Arm)   Pulse 64   Temp 98 F (36.7 C) (Oral)  Resp 17   Ht 5\' 11"  (1.803 m)   Wt 83.9 kg   SpO2 100%   BMI 25.80 kg/m  Vital signs in last 24 hours: Temp:  [97.9 F (36.6 C)-98 F (36.7 C)] 98 F (36.7 C) (06/07 2315) Pulse Rate:  [56-64] 64 (06/07 2315) Resp:  [16-17] 17 (06/07 2315) BP: (145-222)/(72-104) 222/79 (06/07 2315) SpO2:  [99 %-100 %] 100 % (06/07 2315) Weight:  [83.9 kg] 83.9 kg (06/07 2034)   Physical Exam  Appears well-developed and well-nourished.   Neuro: Mental Status: Patient is awake, alert, oriented to person, place, month, and situation.  He gives the year as 2040 Patient is able to give a clear and coherent history. No signs of neglect, speech is slow, but no  difficulty with naming Cranial Nerves: II: Visual Fields are full. Pupils are equal, round, and reactive to light.   III,IV, VI: EOMI without ptosis or diploplia.  V: Facial sensation is symmetric to temperature VII: Facial movement is symmetric.  VIII: hearing is intact to voice X: Uvula elevates symmetrically XI: Shoulder shrug is symmetric. XII: tongue is midline without atrophy or fasciculations.  Motor: Tone is mildly increased. Bulk is normal. 5/5 strength was present in all four extremities.  Sensory: Sensation is symmetric to light touch and temperature in the arms and legs. Cerebellar: No clear ataxia on finger-nose-finger     I have reviewed labs in epic and the results pertinent to this consultation are: Creatinine 1.1  I have reviewed the images obtained: CT head-negative  Impression: 82 year old male with transient episode of aphasia, unsteady gait, and facial droop.  Given this constellation of symptoms, I think the most likely etiology is transient ischemic attack and would favor admission for workup.  Recommendations: - HgbA1c, fasting lipid panel - MRI of the brain without contrast - Frequent neuro checks - Echocardiogram - CTA head and neck - Prophylactic therapy-Antiplatelet med: Aspirin - dose 81mg   - Risk factor modification - Telemetry monitoring - PT consult, OT consult, Speech consult - Stroke team to follow  Ritta Slot, MD [facility] [phone]  If 7pm- 7am, please page neurology on call as listed in AMION.

## 2022-12-30 NOTE — Progress Notes (Signed)
[facility] PROGRESS NOTE   Seth Carter ZOX:[id number] DOB: March 09, 1941 DOA: 12/29/2022  PCP: Seth Noble, MD (Inactive)  Brief History/Interval Summary: 82 y.o. male with medical history significant for Parkinson's disease, neurogenic orthostatic hypotension on Seth Carter, TIA, BPH s/p TURP who presented to the ED for evaluation of change in mental status. Patient loaded golf cart down to the road to get trash cans when he became very dizzy.  He walked up to the house and was disoriented/confused.  Family noted this appeared similar to prior Parkinson's freeze.  He was walking stiffly with ataxic gait.  He was "not there mentally" for 10 minutes.  Family noted that he appeared to have left lower facial droop at the time.  He was not alert enough to get into the house until 10 minutes after symptoms began.  Patient states that he had difficulty getting his words out. He has a history of TIA in January 2024 in [place], [place].  He was on DAPT with aspirin and Plavix, then aspirin 81 mg alone after Plavix discontinued last month.  Patient however states that he has not been taking aspirin lately.  He was started on Seth Carter last month by his neurologist for management of orthostatic hypotension.  Patient reports improvement in most of his symptoms although he is still having some word finding difficulty.  Neurology was consulted.  Patient was hospitalized for further management.   Consultants: Neurology  Procedures: Echocardiogram is pending    Subjective/Interval History: Patient somewhat slow to respond to questions due to his Parkinson's.  Denies any speech difficulties this morning.  Denies any weakness in any 1 side of his body this morning.  No chest pain or shortness of breath.    Assessment/Plan:  Transient aphasia left-sided facial droop plan gait instability Differential diagnoses include TIA, orthostatic hypotension. CT head was negative for acute intracranial abnormalities.   MRI brain negative for stroke.  MRA head and neck was also done.  Results deferred to neurology. Echocardiogram is pending. LDL is 87.  Patient on rosuvastatin currently. HbA1c 5.4. PT and OT evaluation. It looks like patient was on aspirin and Plavix till about a month ago when Plavix was discontinued.  He may not have been taking aspirin on a regular basis as well.  Currently on aspirin. Await further input from neurology.  Orthostatic hypotension He was started on Seth Carter for orthostatic hypotension.  Blood pressure was elevated at the time of admission.  Seth Carter currently on hold.  TED stockings.  Parkinson's disease Continue Seth Carter.  PT and OT evaluation.  SLP evaluation.  DVT Prophylaxis: Lovenox Code Status: Full code Family Communication: Discussed with patient Disposition Plan: To be determined  Status is: Observation The patient remains OBS appropriate and may or may not d/c before 2 midnights.      Medications: Scheduled:  [START ON 12/31/2022]  stroke: early stages of recovery book   Does not apply Once   aspirin EC  81 mg Oral Daily   carbidopa-levodopa  2 tablet Oral TID   donepezil  5 mg Oral QHS   enoxaparin (LOVENOX) injection  40 mg Subcutaneous Q24H   rosuvastatin  10 mg Oral Daily   Continuous: WJX:[id number] **OR** acetaminophen (TYLENOL) oral liquid 160 mg/5 mL **OR** acetaminophen, hydrALAZINE, ondansetron (ZOFRAN) IV, senna-docusate  Antibiotics: Anti-infectives (From admission, onward)    None       Objective:  Vital Signs  Vitals:   12/30/22 0200 12/30/22 0400 12/30/22 0500 12/30/22 0729  BP: (!) 180/80 Marland Kitchen)  156/110 (!) 160/85 (!) 142/66  Pulse: 65 74 68   Resp: 14 14 (!) 21   Temp:  97.6 F (36.4 C)    TempSrc:  Oral    SpO2: 99% 100% 100%   Weight:      Height:       No intake or output data in the 24 hours ending 12/30/22 0836 Filed Weights   12/29/22 2034  Weight: 83.9 kg    General appearance: Awake alert.  In no  distress Resp: Clear to auscultation bilaterally.  Normal effort Cardio: S1-S2 is normal regular.  No S3-S4.  No rubs murmurs or bruit GI: Abdomen is soft.  Nontender nondistended.  Bowel sounds are present normal.  No masses organomegaly Extremities: No edema.  Full range of motion of lower extremities.  Physical deconditioning is noted. Neurologic: Alert and oriented x3.  No focal neurological deficits.  Tremulous.   Lab Results:  Data Reviewed: I have personally reviewed following labs and reports of the imaging studies  CBC: Recent Labs  Lab 12/29/22 1629 12/29/22 1649 12/30/22 0323  WBC 8.0  --  8.6  NEUTROABS 6.0  --   --   HGB 13.1 12.6* 13.5  HCT 38.6* 37.0* 38.9*  MCV 92.8  --  90.7  PLT 166  --  159    Basic Metabolic Panel: Recent Labs  Lab 12/29/22 1629 12/29/22 1649 12/30/22 0323  NA 139 139 138  K 4.1 4.2 3.5  CL 102 101 101  CO2 28  --  23  GLUCOSE 137* 135* 96  BUN 24* 26* 21  CREATININE 1.11 1.10 0.90  CALCIUM 9.5  --  9.3    GFR: Estimated Creatinine Clearance: 65.1 mL/min (by C-G formula based on SCr of 0.9 mg/dL).  Liver Function Tests: Recent Labs  Lab 12/29/22 1629  AST 19  ALT <5  ALKPHOS 62  BILITOT 0.6  PROT 6.6  ALBUMIN 3.7     Coagulation Profile: Recent Labs  Lab 12/29/22 1629  INR 1.2     HbA1C: Recent Labs    12/30/22 0323  HGBA1C 5.4    CBG: Recent Labs  Lab 12/29/22 1652  GLUCAP 122*    Lipid Profile: Recent Labs    12/30/22 0323  CHOL 151  HDL 48  LDLCALC 87  TRIG 79  CHOLHDL 3.1     Radiology Studies: MR ANGIO HEAD WO CONTRAST  Result Date: 12/30/2022 CLINICAL DATA:  Initial evaluation for TIA, mental status change. EXAM: MRI HEAD WITHOUT CONTRAST MRA HEAD WITHOUT CONTRAST MRA NECK WITHOUT AND WITH CONTRAST TECHNIQUE: Multiplanar, multi-echo pulse sequences of the brain and surrounding structures were acquired without intravenous contrast. Angiographic images of the Circle of Willis were  acquired using MRA technique without intravenous contrast. Angiographic images of the neck were acquired using MRA technique without and with intravenous contrast. Carotid stenosis measurements (when applicable) are obtained utilizing NASCET criteria, using the distal internal carotid diameter as the denominator. CONTRAST:  9mL GADAVIST GADOBUTROL 1 MMOL/ML IV SOLN COMPARISON:  Prior CT from 12/29/2022. FINDINGS: MRI HEAD FINDINGS Brain: Mild age-related cerebral atrophy. Patchy T2/FLAIR hyperintensity involving the periventricular and deep white matter both cerebral hemispheres as well as the pons, consistent with chronic small vessel ischemic disease, mild for age. Few small remote left cerebellar infarcts noted. No evidence for acute or subacute ischemia. Gray-white matter differentiation maintained. No other areas of chronic cortical infarction. No acute intracranial hemorrhage. Subtle siderosis noted about the cerebellar vermis, suggesting prior subarachnoid hemorrhage. Few  additional punctate chronic micro hemorrhages noted within the right cerebellum and right temporal lobe. No mass lesion, midline shift or mass effect no hydrocephalus or extra-axial fluid collection. Pituitary gland and suprasellar region within normal limits. Vascular: Loss of normal flow void within the left vertebral artery, consistent with slow flow and/or occlusion. Major intracranial vascular flow voids are otherwise maintained. Skull and upper cervical spine: Craniocervical junction within normal limits. Bone marrow signal intensity normal. No scalp soft tissue abnormality. Sinuses/Orbits: Prior bilateral ocular lens replacement. Scattered mucosal thickening noted about the ethmoidal air cells. Paranasal sinuses are otherwise largely clear. Moderate left mastoid effusion, of uncertain significance. Other: None. MRA HEAD FINDINGS Anterior circulation: Both internal carotid arteries are patent through the siphons without significant  stenosis or other abnormality. A1 segments patent bilaterally. Normal anterior communicating complex. Anterior cerebral arteries patent without stenosis. No significant M1 stenosis. Left M1 bifurcates early. Focal moderate proximal left M2 stenosis, superior division (series 10 20, image 1). Focal severe proximal right M2 stenosis, inferior division (series 10 20, image 1). MCA branches are otherwise patent and well perfused, although demonstrates small vessel atheromatous irregularity. Posterior circulation: Right vertebral artery dominant and widely patent as it courses into the cranial vault. Mild stenosis noted just prior to the vertebrobasilar junction. Left V4 segment is hypoplastic and not well seen on this portion of the exam. On corresponding MRA of the neck, the left vertebral artery appears to be patent to the skull base. Attenuated flow within the proximal left V4 segment with some perfusion of the left PICA. Left V4 segment occluded distally. Neither PICA is well visualized. Basilar mildly irregular but patent without significant stenosis. Superior cerebral arteries patent bilaterally. Both PCAs primarily supplied via the basilar. Short-segment moderate right and severe left proximal P2 stenoses (series 1010, image 6). PCAs are irregular but remain patent to their distal aspects. Anatomic variants: As above.  No aneurysm. MRA NECK FINDINGS Aortic arch: Visualized aortic arch normal caliber with standard branch pattern. No stenosis about the origin the great vessels. Right carotid system: Right common and internal carotid arteries are patent without evidence for dissection. Minor for age atheromatous irregularity about the right carotid bulb without hemodynamically significant stenosis. Left carotid system: Left common and internal carotid arteries are patent without evidence for dissection. Mild for age atheromatous irregularity about the left carotid bulb without hemodynamically significant stenosis.  Vertebral arteries: Both vertebral arteries arise from subclavian arteries. The proximal left subclavian artery is incompletely visualized on this exam. Vertebral arteries M cells are patent within the neck without stenosis or evidence for dissection. Other: None IMPRESSION: MRI HEAD IMPRESSION: 1. No acute intracranial infarct or other abnormality. 2. Mild age-related cerebral atrophy with chronic small vessel ischemic disease, with a few small remote left cerebellar infarcts. 3. Subtle siderosis about the cerebellar vermis, suggesting prior subarachnoid hemorrhage. MRA HEAD IMPRESSION: 1. Negative intracranial MRA for acute large vessel occlusion. 2. Occlusion of the intradural left V4 segment beyond the takeoff of the left PICA, presumably chronic in nature given the lack of acute ischemia. 3. Intracranial atherosclerotic disease with associated moderate to severe bilateral M2 and P2 stenoses as above. MRA NECK IMPRESSION: Wide patency of both carotid artery systems and vertebral arteries within the neck. No hemodynamically significant stenosis or other acute vascular abnormality. Electronically Signed   By: Rise Mu M.D.   On: 12/30/2022 01:35   MR BRAIN WO CONTRAST  Result Date: 12/30/2022 CLINICAL DATA:  Initial evaluation for TIA, mental status change. EXAM:  MRI HEAD WITHOUT CONTRAST MRA HEAD WITHOUT CONTRAST MRA NECK WITHOUT AND WITH CONTRAST TECHNIQUE: Multiplanar, multi-echo pulse sequences of the brain and surrounding structures were acquired without intravenous contrast. Angiographic images of the Circle of Willis were acquired using MRA technique without intravenous contrast. Angiographic images of the neck were acquired using MRA technique without and with intravenous contrast. Carotid stenosis measurements (when applicable) are obtained utilizing NASCET criteria, using the distal internal carotid diameter as the denominator. CONTRAST:  9mL GADAVIST GADOBUTROL 1 MMOL/ML IV SOLN  COMPARISON:  Prior CT from 12/29/2022. FINDINGS: MRI HEAD FINDINGS Brain: Mild age-related cerebral atrophy. Patchy T2/FLAIR hyperintensity involving the periventricular and deep white matter both cerebral hemispheres as well as the pons, consistent with chronic small vessel ischemic disease, mild for age. Few small remote left cerebellar infarcts noted. No evidence for acute or subacute ischemia. Gray-white matter differentiation maintained. No other areas of chronic cortical infarction. No acute intracranial hemorrhage. Subtle siderosis noted about the cerebellar vermis, suggesting prior subarachnoid hemorrhage. Few additional punctate chronic micro hemorrhages noted within the right cerebellum and right temporal lobe. No mass lesion, midline shift or mass effect no hydrocephalus or extra-axial fluid collection. Pituitary gland and suprasellar region within normal limits. Vascular: Loss of normal flow void within the left vertebral artery, consistent with slow flow and/or occlusion. Major intracranial vascular flow voids are otherwise maintained. Skull and upper cervical spine: Craniocervical junction within normal limits. Bone marrow signal intensity normal. No scalp soft tissue abnormality. Sinuses/Orbits: Prior bilateral ocular lens replacement. Scattered mucosal thickening noted about the ethmoidal air cells. Paranasal sinuses are otherwise largely clear. Moderate left mastoid effusion, of uncertain significance. Other: None. MRA HEAD FINDINGS Anterior circulation: Both internal carotid arteries are patent through the siphons without significant stenosis or other abnormality. A1 segments patent bilaterally. Normal anterior communicating complex. Anterior cerebral arteries patent without stenosis. No significant M1 stenosis. Left M1 bifurcates early. Focal moderate proximal left M2 stenosis, superior division (series 10 20, image 1). Focal severe proximal right M2 stenosis, inferior division (series 10 20,  image 1). MCA branches are otherwise patent and well perfused, although demonstrates small vessel atheromatous irregularity. Posterior circulation: Right vertebral artery dominant and widely patent as it courses into the cranial vault. Mild stenosis noted just prior to the vertebrobasilar junction. Left V4 segment is hypoplastic and not well seen on this portion of the exam. On corresponding MRA of the neck, the left vertebral artery appears to be patent to the skull base. Attenuated flow within the proximal left V4 segment with some perfusion of the left PICA. Left V4 segment occluded distally. Neither PICA is well visualized. Basilar mildly irregular but patent without significant stenosis. Superior cerebral arteries patent bilaterally. Both PCAs primarily supplied via the basilar. Short-segment moderate right and severe left proximal P2 stenoses (series 1010, image 6). PCAs are irregular but remain patent to their distal aspects. Anatomic variants: As above.  No aneurysm. MRA NECK FINDINGS Aortic arch: Visualized aortic arch normal caliber with standard branch pattern. No stenosis about the origin the great vessels. Right carotid system: Right common and internal carotid arteries are patent without evidence for dissection. Minor for age atheromatous irregularity about the right carotid bulb without hemodynamically significant stenosis. Left carotid system: Left common and internal carotid arteries are patent without evidence for dissection. Mild for age atheromatous irregularity about the left carotid bulb without hemodynamically significant stenosis. Vertebral arteries: Both vertebral arteries arise from subclavian arteries. The proximal left subclavian artery is incompletely visualized on this exam.  Vertebral arteries M cells are patent within the neck without stenosis or evidence for dissection. Other: None IMPRESSION: MRI HEAD IMPRESSION: 1. No acute intracranial infarct or other abnormality. 2. Mild  age-related cerebral atrophy with chronic small vessel ischemic disease, with a few small remote left cerebellar infarcts. 3. Subtle siderosis about the cerebellar vermis, suggesting prior subarachnoid hemorrhage. MRA HEAD IMPRESSION: 1. Negative intracranial MRA for acute large vessel occlusion. 2. Occlusion of the intradural left V4 segment beyond the takeoff of the left PICA, presumably chronic in nature given the lack of acute ischemia. 3. Intracranial atherosclerotic disease with associated moderate to severe bilateral M2 and P2 stenoses as above. MRA NECK IMPRESSION: Wide patency of both carotid artery systems and vertebral arteries within the neck. No hemodynamically significant stenosis or other acute vascular abnormality. Electronically Signed   By: Rise Mu M.D.   On: 12/30/2022 01:35   MR Angiogram Neck W or Wo Contrast  Result Date: 12/30/2022 CLINICAL DATA:  Initial evaluation for TIA, mental status change. EXAM: MRI HEAD WITHOUT CONTRAST MRA HEAD WITHOUT CONTRAST MRA NECK WITHOUT AND WITH CONTRAST TECHNIQUE: Multiplanar, multi-echo pulse sequences of the brain and surrounding structures were acquired without intravenous contrast. Angiographic images of the Circle of Willis were acquired using MRA technique without intravenous contrast. Angiographic images of the neck were acquired using MRA technique without and with intravenous contrast. Carotid stenosis measurements (when applicable) are obtained utilizing NASCET criteria, using the distal internal carotid diameter as the denominator. CONTRAST:  9mL GADAVIST GADOBUTROL 1 MMOL/ML IV SOLN COMPARISON:  Prior CT from 12/29/2022. FINDINGS: MRI HEAD FINDINGS Brain: Mild age-related cerebral atrophy. Patchy T2/FLAIR hyperintensity involving the periventricular and deep white matter both cerebral hemispheres as well as the pons, consistent with chronic small vessel ischemic disease, mild for age. Few small remote left cerebellar infarcts  noted. No evidence for acute or subacute ischemia. Gray-white matter differentiation maintained. No other areas of chronic cortical infarction. No acute intracranial hemorrhage. Subtle siderosis noted about the cerebellar vermis, suggesting prior subarachnoid hemorrhage. Few additional punctate chronic micro hemorrhages noted within the right cerebellum and right temporal lobe. No mass lesion, midline shift or mass effect no hydrocephalus or extra-axial fluid collection. Pituitary gland and suprasellar region within normal limits. Vascular: Loss of normal flow void within the left vertebral artery, consistent with slow flow and/or occlusion. Major intracranial vascular flow voids are otherwise maintained. Skull and upper cervical spine: Craniocervical junction within normal limits. Bone marrow signal intensity normal. No scalp soft tissue abnormality. Sinuses/Orbits: Prior bilateral ocular lens replacement. Scattered mucosal thickening noted about the ethmoidal air cells. Paranasal sinuses are otherwise largely clear. Moderate left mastoid effusion, of uncertain significance. Other: None. MRA HEAD FINDINGS Anterior circulation: Both internal carotid arteries are patent through the siphons without significant stenosis or other abnormality. A1 segments patent bilaterally. Normal anterior communicating complex. Anterior cerebral arteries patent without stenosis. No significant M1 stenosis. Left M1 bifurcates early. Focal moderate proximal left M2 stenosis, superior division (series 10 20, image 1). Focal severe proximal right M2 stenosis, inferior division (series 10 20, image 1). MCA branches are otherwise patent and well perfused, although demonstrates small vessel atheromatous irregularity. Posterior circulation: Right vertebral artery dominant and widely patent as it courses into the cranial vault. Mild stenosis noted just prior to the vertebrobasilar junction. Left V4 segment is hypoplastic and not well seen on  this portion of the exam. On corresponding MRA of the neck, the left vertebral artery appears to be patent to the skull  base. Attenuated flow within the proximal left V4 segment with some perfusion of the left PICA. Left V4 segment occluded distally. Neither PICA is well visualized. Basilar mildly irregular but patent without significant stenosis. Superior cerebral arteries patent bilaterally. Both PCAs primarily supplied via the basilar. Short-segment moderate right and severe left proximal P2 stenoses (series 1010, image 6). PCAs are irregular but remain patent to their distal aspects. Anatomic variants: As above.  No aneurysm. MRA NECK FINDINGS Aortic arch: Visualized aortic arch normal caliber with standard branch pattern. No stenosis about the origin the great vessels. Right carotid system: Right common and internal carotid arteries are patent without evidence for dissection. Minor for age atheromatous irregularity about the right carotid bulb without hemodynamically significant stenosis. Left carotid system: Left common and internal carotid arteries are patent without evidence for dissection. Mild for age atheromatous irregularity about the left carotid bulb without hemodynamically significant stenosis. Vertebral arteries: Both vertebral arteries arise from subclavian arteries. The proximal left subclavian artery is incompletely visualized on this exam. Vertebral arteries M cells are patent within the neck without stenosis or evidence for dissection. Other: None IMPRESSION: MRI HEAD IMPRESSION: 1. No acute intracranial infarct or other abnormality. 2. Mild age-related cerebral atrophy with chronic small vessel ischemic disease, with a few small remote left cerebellar infarcts. 3. Subtle siderosis about the cerebellar vermis, suggesting prior subarachnoid hemorrhage. MRA HEAD IMPRESSION: 1. Negative intracranial MRA for acute large vessel occlusion. 2. Occlusion of the intradural left V4 segment beyond the  takeoff of the left PICA, presumably chronic in nature given the lack of acute ischemia. 3. Intracranial atherosclerotic disease with associated moderate to severe bilateral M2 and P2 stenoses as above. MRA NECK IMPRESSION: Wide patency of both carotid artery systems and vertebral arteries within the neck. No hemodynamically significant stenosis or other acute vascular abnormality. Electronically Signed   By: Rise Mu M.D.   On: 12/30/2022 01:35   CT HEAD WO CONTRAST  Result Date: 12/29/2022 CLINICAL DATA:  Transient ischemic attack. EXAM: CT HEAD WITHOUT CONTRAST TECHNIQUE: Contiguous axial images were obtained from the base of the skull through the vertex without intravenous contrast. RADIATION DOSE REDUCTION: This exam was performed according to the departmental dose-optimization program which includes automated exposure control, adjustment of the mA and/or kV according to patient size and/or use of iterative reconstruction technique. COMPARISON:  09/29/2020. FINDINGS: Brain: There is no evidence for acute hemorrhage, hydrocephalus, mass lesion, or abnormal extra-axial fluid collection. No definite CT evidence for acute infarction. Diffuse loss of parenchymal volume is consistent with atrophy. Patchy low attenuation in the deep hemispheric and periventricular white matter is nonspecific, but likely reflects chronic microvascular ischemic demyelination. Chronic lacunar infarcts are seen in the basal ganglia bilaterally. Vascular: No hyperdense vessel or unexpected calcification. Skull: No evidence for fracture. No worrisome lytic or sclerotic lesion. Sinuses/Orbits: Left mastoid effusion is new in the interval. Right mastoid air cells are clear. Paranasal sinuses are clear. Visualized portions of the globes and intraorbital fat are unremarkable. Other: None. IMPRESSION: 1. Left mastoid effusion.  New since prior study. 2. No acute intracranial abnormality. 3. Atrophy with chronic small vessel white  matter ischemic disease. Electronically Signed   By: Kennith Center M.D.   On: 12/29/2022 18:12       LOS: 0 days   Seth Carter  [facility] Pager on [email or web address]  12/30/2022, 8:36 AM

## 2022-12-30 NOTE — ED Notes (Signed)
Pt sleeping, remains hypertensive, due hydralazine in 1 hour. No complaints voiced.

## 2022-12-30 NOTE — Hospital Course (Signed)
Seth Carter is a 82 y.o. male with medical history significant for Parkinson's disease, neurogenic orthostatic hypotension on Florinef, TIA, BPH s/p TURP who presented with change in mentation with transient left facial droop and admitted for CVA workup per neurology recommendation.

## 2022-12-30 NOTE — Progress Notes (Signed)
  Echocardiogram 2D Echocardiogram has been performed.  [name] 12/30/2022, 9:59 AM

## 2022-12-30 NOTE — ED Notes (Signed)
ED TO INPATIENT HANDOFF REPORT  ED Nurse Name and Phone #:  Marisue Ivan [phone]  S Name/Age/Gender Seth Carter 82 y.o. male Room/Bed: 043C/043C  Code Status   Code Status: Full Code  Home/SNF/Other Home Patient oriented to: self, place, and situation Is this baseline? Yes   Triage Complete: Triage complete  Chief Complaint Stroke-like symptoms [R29.90]  Triage Note Patient's daughter reports episode of disorientation and altered gait lasting about fifteen minutes. Patient has hx of Parkinson's disease and daughter states it was like a Parkinson's freeze where he was spatially disoriented and confused with a possible facial droop. Symptoms have resolved at the time of triage.    Allergies Allergies  Allergen Reactions   Cialis [Tadalafil] Other (See Comments)    Severe back pain   Milk-Related Compounds Diarrhea and Other (See Comments)    Upsets the stomach   Pneumovax [Pneumococcal Polysaccharide Vaccine] Swelling and Other (See Comments)    Arm swelling and pain   Milk (Cow) Diarrhea and Other (See Comments)    Upsets the stomach   Simvastatin Other (See Comments)    Nightmares    Ace Inhibitors Cough   Contrast Media [Iodinated Contrast Media] Itching, Rash and Other (See Comments)    POSSIBLE delayed reaction = all-over body rash and seizures that began after this was used   Sinemet [Carbidopa W-Levodopa] Itching, Rash and Other (See Comments)    POSSIBLE delayed reaction = all-over body rash and seizures that began after starting this    Level of Care/Admitting Diagnosis ED Disposition     ED Disposition  Admit   Condition  --   Comment  Hospital Area: [facility] [[id number]]  Level of Care: Telemetry Medical [104]  May place patient in observation at [facility] or [facility] if equivalent level of care is available:: No  Covid Evaluation: Asymptomatic - no recent exposure (last 10 days) testing not required  Diagnosis: Stroke-like symptoms  [725552]  Admitting Physician: Charlsie Quest [[id number]]  Attending Physician: Charlsie Quest [[id number]]          B Medical/Surgery History Past Medical History:  Diagnosis Date   Acute pharyngitis    AKI (acute kidney injury) 09/29/2020   Allergic rhinitis    Benign prostatic hyperplasia with lower urinary tract symptoms 09/23/2020   Bradykinesia    Cervical spondylosis without myelopathy 12/18/2019   Chronic back pain    Chronic sinusitis    Degeneration of lumbar intervertebral disc 08/20/2020   ED (erectile dysfunction)    Enlarged prostate 12/15/2020   Essential hypertension    nuclear test in epic 01-16-2020 normal perfusion no ischemia, normal lvf and wall motion, nuclear ef 65%   Family history of hemochromatosis    Fatigue    Gastroesophageal reflux disease without esophagitis 12/18/2019   Generalized weakness    Glaucoma, both eyes    Hardening of the aorta (main artery of the heart)    Hemorrhoids    History of syncope    cardiology evaluation by [name], note in epic 12-26-2019, work-up includes event monitor 02-04-2020( showed SR with occ PAC & PVC rare PAT)/ echo  12-26-2019 (mild LVH, ef 50-55%, moderate MR, mild to moderate AV sclerosis no stenosis, ascending aorta 39mm) and nuclear test 01-16-2020 (normal perfusion w/ normal LV and wall motion, nuclear ef 65%)   Hyperglycemia 12/18/2019   Hypogonadism in male    Leukocytosis 09/29/2020   Macular degeneration, right eye    Mild neurocognitive disorder due to  Parkinson's disease 12/15/2020   Mixed hyperlipidemia    Onychomycosis 12/18/2019   Osteoarthritis of knee 12/18/2019   Pain in hand 12/18/2019   Parkinson's disease 08/2020   Renal stone 12/18/2019   Sebaceous cyst 12/18/2019   Seizure-like activity 09/29/2020   Spinal stenosis of lumbar region 08/20/2020   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/2019   Toxic effect of venom of wasps 12/18/2019   Vitamin B12 deficiency    Vitamin D deficiency    Past Surgical History:   Procedure Laterality Date   ANKLE SURGERY Right 10-04-2015  @[facility]    debridement achilles tendon & reattachment and heel excision osteophyte   CATARACT EXTRACTION W/ INTRAOCULAR LENS  IMPLANT, BILATERAL  yrs ago   CYSTOSCOPY WITH INSERTION OF UROLIFT  2019 approx.   SHOULDER SURGERY Right 1990's   TONSILLECTOMY  age 52   TRANSURETHRAL RESECTION OF PROSTATE N/A 09/23/2020   Procedure: TRANSURETHRAL RESECTION OF THE PROSTATE (TURP);  Surgeon: [name], MD;  Location: [facility];  Service: Urology;  Laterality: N/A;  75 MINS     A IV Location/Drains/Wounds Patient Lines/Drains/Airways Status     Active Line/Drains/Airways     Name Placement date Placement time Site Days   Peripheral IV 12/30/22 22 G Right Antecubital 12/30/22  0015  Antecubital  less than 1   Incision (Closed) 09/23/20 Penis 09/23/20  0850  -- 828            Intake/Output Last 24 hours No intake or output data in the 24 hours ending 12/30/22 1010  Labs/Imaging Results for orders placed or performed during the hospital encounter of 12/29/22 (from the past 48 hour(s))  Protime-INR     Status: None   Collection Time: 12/29/22  4:29 PM  Result Value Ref Range   Prothrombin Time 15.0 11.4 - 15.2 seconds   INR 1.2 0.8 - 1.2    Comment: (NOTE) INR goal varies based on device and disease states. Performed at [facility], [address] [place], [place] [address]   APTT     Status: None   Collection Time: 12/29/22  4:29 PM  Result Value Ref Range   aPTT 31 24 - 36 seconds    Comment: Performed at [facility], [address], [place], [place] [address]  CBC     Status: Abnormal   Collection Time: 12/29/22  4:29 PM  Result Value Ref Range   WBC 8.0 4.0 - 10.5 K/uL   RBC 4.16 (L) 4.22 - 5.81 MIL/uL   Hemoglobin 13.1 13.0 - 17.0 g/dL   HCT 09.8 (L) 11.9 - 14.7 %   MCV 92.8 80.0 - 100.0 fL   MCH 31.5 26.0 - 34.0 pg   MCHC 33.9 30.0 - 36.0 g/dL   RDW 82.9 56.2 - 13.0 %    Platelets 166 150 - 400 K/uL   nRBC 0.0 0.0 - 0.2 %    Comment: Performed at [facility], [address], [place], [place] [address]  Differential     Status: None   Collection Time: 12/29/22  4:29 PM  Result Value Ref Range   Neutrophils Relative % 74 %   Neutro Abs 6.0 1.7 - 7.7 K/uL   Lymphocytes Relative 15 %   Lymphs Abs 1.2 0.7 - 4.0 K/uL   Monocytes Relative 8 %   Monocytes Absolute 0.6 0.1 - 1.0 K/uL   Eosinophils Relative 2 %   Eosinophils Absolute 0.2 0.0 - 0.5 K/uL   Basophils Relative  1 %   Basophils Absolute 0.0 0.0 - 0.1 K/uL   Immature Granulocytes 0 %   Abs Immature Granulocytes 0.01 0.00 - 0.07 K/uL    Comment: Performed at [facility], [address] [place], [place] [address]  Comprehensive metabolic panel     Status: Abnormal   Collection Time: 12/29/22  4:29 PM  Result Value Ref Range   Sodium 139 135 - 145 mmol/L   Potassium 4.1 3.5 - 5.1 mmol/L   Chloride 102 98 - 111 mmol/L   CO2 28 22 - 32 mmol/L   Glucose, Bld 137 (H) 70 - 99 mg/dL    Comment: Glucose reference range applies only to samples taken after fasting for at least 8 hours.   BUN 24 (H) 8 - 23 mg/dL   Creatinine, Ser 1.91 0.61 - 1.24 mg/dL   Calcium 9.5 8.9 - 47.8 mg/dL   Total Protein 6.6 6.5 - 8.1 g/dL   Albumin 3.7 3.5 - 5.0 g/dL   AST 19 15 - 41 U/L   ALT <5 0 - 44 U/L   Alkaline Phosphatase 62 38 - 126 U/L   Total Bilirubin 0.6 0.3 - 1.2 mg/dL   GFR, Estimated >29 >56 mL/min    Comment: (NOTE) Calculated using the CKD-EPI Creatinine Equation (2021)    Anion gap 9 5 - 15    Comment: Performed at [facility], [address], [place], [place] [address]  Ethanol     Status: None   Collection Time: 12/29/22  4:29 PM  Result Value Ref Range   Alcohol, Ethyl (B) <10 <10 mg/dL    Comment: (NOTE) Lowest detectable limit for serum alcohol is 10 mg/dL.  For medical purposes only. Performed at [facility], [address] [place], [place] [address]   I-stat  chem 8, ED     Status: Abnormal   Collection Time: 12/29/22  4:49 PM  Result Value Ref Range   Sodium 139 135 - 145 mmol/L   Potassium 4.2 3.5 - 5.1 mmol/L   Chloride 101 98 - 111 mmol/L   BUN 26 (H) 8 - 23 mg/dL   Creatinine, Ser 6.96 0.61 - 1.24 mg/dL   Glucose, Bld 295 (H) 70 - 99 mg/dL    Comment: Glucose reference range applies only to samples taken after fasting for at least 8 hours.   Calcium, Ion 1.23 1.15 - 1.40 mmol/L   TCO2 29 22 - 32 mmol/L   Hemoglobin 12.6 (L) 13.0 - 17.0 g/dL   HCT 28.4 (L) 13.2 - 44.0 %  CBG monitoring, ED     Status: Abnormal   Collection Time: 12/29/22  4:52 PM  Result Value Ref Range   Glucose-Capillary 122 (H) 70 - 99 mg/dL    Comment: Glucose reference range applies only to samples taken after fasting for at least 8 hours.  Lipid panel     Status: None   Collection Time: 12/30/22  3:23 AM  Result Value Ref Range   Cholesterol 151 0 - 200 mg/dL   Triglycerides 79 <102 mg/dL   HDL 48 >72 mg/dL   Total CHOL/HDL Ratio 3.1 RATIO   VLDL 16 0 - 40 mg/dL   LDL Cholesterol 87 0 - 99 mg/dL    Comment:        Total Cholesterol/HDL:CHD Risk Coronary Heart Disease Risk Table                     Men  Women  1/2 Average Risk   3.4   3.3  Average Risk       5.0   4.4  2 X Average Risk   9.6   7.1  3 X Average Risk  23.4   11.0        Use the calculated Patient Ratio above and the CHD Risk Table to determine the patient's CHD Risk.        ATP III CLASSIFICATION (LDL):  <100     mg/dL   Optimal  161-096  mg/dL   Near or Above                    Optimal  130-159  mg/dL   Borderline  045-409  mg/dL   High  >811     mg/dL   Very High Performed at [facility], [address] [place], [place] [address]   Hemoglobin A1c     Status: None   Collection Time: 12/30/22  3:23 AM  Result Value Ref Range   Hgb A1c MFr Bld 5.4 4.8 - 5.6 %    Comment: (NOTE) Pre diabetes:          5.7%-6.4%  Diabetes:              >6.4%  Glycemic control for    <7.0% adults with diabetes    Mean Plasma Glucose 108.28 mg/dL    Comment: Performed at [facility], [address], [place], [place] [address]  CBC     Status: Abnormal   Collection Time: 12/30/22  3:23 AM  Result Value Ref Range   WBC 8.6 4.0 - 10.5 K/uL   RBC 4.29 4.22 - 5.81 MIL/uL   Hemoglobin 13.5 13.0 - 17.0 g/dL   HCT 13.0 (L) 86.5 - 78.4 %   MCV 90.7 80.0 - 100.0 fL   MCH 31.5 26.0 - 34.0 pg   MCHC 34.7 30.0 - 36.0 g/dL   RDW 69.6 29.5 - 28.4 %   Platelets 159 150 - 400 K/uL   nRBC 0.0 0.0 - 0.2 %    Comment: Performed at [facility], [address], [place], [place] [address]  Basic metabolic panel     Status: None   Collection Time: 12/30/22  3:23 AM  Result Value Ref Range   Sodium 138 135 - 145 mmol/L   Potassium 3.5 3.5 - 5.1 mmol/L   Chloride 101 98 - 111 mmol/L   CO2 23 22 - 32 mmol/L   Glucose, Bld 96 70 - 99 mg/dL    Comment: Glucose reference range applies only to samples taken after fasting for at least 8 hours.   BUN 21 8 - 23 mg/dL   Creatinine, Ser 0.10 0.61 - 1.24 mg/dL   Calcium 9.3 8.9 - 27.2 mg/dL   GFR, Estimated >53 >66 mL/min    Comment: (NOTE) Calculated using the CKD-EPI Creatinine Equation (2021)    Anion gap 14 5 - 15    Comment: Performed at [facility], [address], [place], [place] [address]   MR ANGIO HEAD WO CONTRAST  Result Date: 12/30/2022 CLINICAL DATA:  Initial evaluation for TIA, mental status change. EXAM: MRI HEAD WITHOUT CONTRAST MRA HEAD WITHOUT CONTRAST MRA NECK WITHOUT AND WITH CONTRAST TECHNIQUE: Multiplanar, multi-echo pulse sequences of the brain and surrounding structures were acquired without intravenous contrast. Angiographic images of the Circle of Willis were acquired using MRA technique without intravenous contrast. Angiographic  images of the neck were acquired using MRA technique without and with intravenous contrast. Carotid stenosis measurements (when applicable) are obtained utilizing NASCET  criteria, using the distal internal carotid diameter as the denominator. CONTRAST:  9mL GADAVIST GADOBUTROL 1 MMOL/ML IV SOLN COMPARISON:  Prior CT from 12/29/2022. FINDINGS: MRI HEAD FINDINGS Brain: Mild age-related cerebral atrophy. Patchy T2/FLAIR hyperintensity involving the periventricular and deep white matter both cerebral hemispheres as well as the pons, consistent with chronic small vessel ischemic disease, mild for age. Few small remote left cerebellar infarcts noted. No evidence for acute or subacute ischemia. Gray-white matter differentiation maintained. No other areas of chronic cortical infarction. No acute intracranial hemorrhage. Subtle siderosis noted about the cerebellar vermis, suggesting prior subarachnoid hemorrhage. Few additional punctate chronic micro hemorrhages noted within the right cerebellum and right temporal lobe. No mass lesion, midline shift or mass effect no hydrocephalus or extra-axial fluid collection. Pituitary gland and suprasellar region within normal limits. Vascular: Loss of normal flow void within the left vertebral artery, consistent with slow flow and/or occlusion. Major intracranial vascular flow voids are otherwise maintained. Skull and upper cervical spine: Craniocervical junction within normal limits. Bone marrow signal intensity normal. No scalp soft tissue abnormality. Sinuses/Orbits: Prior bilateral ocular lens replacement. Scattered mucosal thickening noted about the ethmoidal air cells. Paranasal sinuses are otherwise largely clear. Moderate left mastoid effusion, of uncertain significance. Other: None. MRA HEAD FINDINGS Anterior circulation: Both internal carotid arteries are patent through the siphons without significant stenosis or other abnormality. A1 segments patent bilaterally. Normal anterior communicating complex. Anterior cerebral arteries patent without stenosis. No significant M1 stenosis. Left M1 bifurcates early. Focal moderate proximal left M2  stenosis, superior division (series 10 20, image 1). Focal severe proximal right M2 stenosis, inferior division (series 10 20, image 1). MCA branches are otherwise patent and well perfused, although demonstrates small vessel atheromatous irregularity. Posterior circulation: Right vertebral artery dominant and widely patent as it courses into the cranial vault. Mild stenosis noted just prior to the vertebrobasilar junction. Left V4 segment is hypoplastic and not well seen on this portion of the exam. On corresponding MRA of the neck, the left vertebral artery appears to be patent to the skull base. Attenuated flow within the proximal left V4 segment with some perfusion of the left PICA. Left V4 segment occluded distally. Neither PICA is well visualized. Basilar mildly irregular but patent without significant stenosis. Superior cerebral arteries patent bilaterally. Both PCAs primarily supplied via the basilar. Short-segment moderate right and severe left proximal P2 stenoses (series 1010, image 6). PCAs are irregular but remain patent to their distal aspects. Anatomic variants: As above.  No aneurysm. MRA NECK FINDINGS Aortic arch: Visualized aortic arch normal caliber with standard branch pattern. No stenosis about the origin the great vessels. Right carotid system: Right common and internal carotid arteries are patent without evidence for dissection. Minor for age atheromatous irregularity about the right carotid bulb without hemodynamically significant stenosis. Left carotid system: Left common and internal carotid arteries are patent without evidence for dissection. Mild for age atheromatous irregularity about the left carotid bulb without hemodynamically significant stenosis. Vertebral arteries: Both vertebral arteries arise from subclavian arteries. The proximal left subclavian artery is incompletely visualized on this exam. Vertebral arteries M cells are patent within the neck without stenosis or evidence for  dissection. Other: None IMPRESSION: MRI HEAD IMPRESSION: 1. No acute intracranial infarct or other abnormality. 2. Mild age-related cerebral atrophy with chronic small vessel ischemic disease, with a few small remote  left cerebellar infarcts. 3. Subtle siderosis about the cerebellar vermis, suggesting prior subarachnoid hemorrhage. MRA HEAD IMPRESSION: 1. Negative intracranial MRA for acute large vessel occlusion. 2. Occlusion of the intradural left V4 segment beyond the takeoff of the left PICA, presumably chronic in nature given the lack of acute ischemia. 3. Intracranial atherosclerotic disease with associated moderate to severe bilateral M2 and P2 stenoses as above. MRA NECK IMPRESSION: Wide patency of both carotid artery systems and vertebral arteries within the neck. No hemodynamically significant stenosis or other acute vascular abnormality. Electronically Signed   By: Rise Mu M.D.   On: 12/30/2022 01:35   MR BRAIN WO CONTRAST  Result Date: 12/30/2022 CLINICAL DATA:  Initial evaluation for TIA, mental status change. EXAM: MRI HEAD WITHOUT CONTRAST MRA HEAD WITHOUT CONTRAST MRA NECK WITHOUT AND WITH CONTRAST TECHNIQUE: Multiplanar, multi-echo pulse sequences of the brain and surrounding structures were acquired without intravenous contrast. Angiographic images of the Circle of Willis were acquired using MRA technique without intravenous contrast. Angiographic images of the neck were acquired using MRA technique without and with intravenous contrast. Carotid stenosis measurements (when applicable) are obtained utilizing NASCET criteria, using the distal internal carotid diameter as the denominator. CONTRAST:  9mL GADAVIST GADOBUTROL 1 MMOL/ML IV SOLN COMPARISON:  Prior CT from 12/29/2022. FINDINGS: MRI HEAD FINDINGS Brain: Mild age-related cerebral atrophy. Patchy T2/FLAIR hyperintensity involving the periventricular and deep white matter both cerebral hemispheres as well as the pons, consistent  with chronic small vessel ischemic disease, mild for age. Few small remote left cerebellar infarcts noted. No evidence for acute or subacute ischemia. Gray-white matter differentiation maintained. No other areas of chronic cortical infarction. No acute intracranial hemorrhage. Subtle siderosis noted about the cerebellar vermis, suggesting prior subarachnoid hemorrhage. Few additional punctate chronic micro hemorrhages noted within the right cerebellum and right temporal lobe. No mass lesion, midline shift or mass effect no hydrocephalus or extra-axial fluid collection. Pituitary gland and suprasellar region within normal limits. Vascular: Loss of normal flow void within the left vertebral artery, consistent with slow flow and/or occlusion. Major intracranial vascular flow voids are otherwise maintained. Skull and upper cervical spine: Craniocervical junction within normal limits. Bone marrow signal intensity normal. No scalp soft tissue abnormality. Sinuses/Orbits: Prior bilateral ocular lens replacement. Scattered mucosal thickening noted about the ethmoidal air cells. Paranasal sinuses are otherwise largely clear. Moderate left mastoid effusion, of uncertain significance. Other: None. MRA HEAD FINDINGS Anterior circulation: Both internal carotid arteries are patent through the siphons without significant stenosis or other abnormality. A1 segments patent bilaterally. Normal anterior communicating complex. Anterior cerebral arteries patent without stenosis. No significant M1 stenosis. Left M1 bifurcates early. Focal moderate proximal left M2 stenosis, superior division (series 10 20, image 1). Focal severe proximal right M2 stenosis, inferior division (series 10 20, image 1). MCA branches are otherwise patent and well perfused, although demonstrates small vessel atheromatous irregularity. Posterior circulation: Right vertebral artery dominant and widely patent as it courses into the cranial vault. Mild stenosis  noted just prior to the vertebrobasilar junction. Left V4 segment is hypoplastic and not well seen on this portion of the exam. On corresponding MRA of the neck, the left vertebral artery appears to be patent to the skull base. Attenuated flow within the proximal left V4 segment with some perfusion of the left PICA. Left V4 segment occluded distally. Neither PICA is well visualized. Basilar mildly irregular but patent without significant stenosis. Superior cerebral arteries patent bilaterally. Both PCAs primarily supplied via the basilar. Short-segment moderate right  and severe left proximal P2 stenoses (series 1010, image 6). PCAs are irregular but remain patent to their distal aspects. Anatomic variants: As above.  No aneurysm. MRA NECK FINDINGS Aortic arch: Visualized aortic arch normal caliber with standard branch pattern. No stenosis about the origin the great vessels. Right carotid system: Right common and internal carotid arteries are patent without evidence for dissection. Minor for age atheromatous irregularity about the right carotid bulb without hemodynamically significant stenosis. Left carotid system: Left common and internal carotid arteries are patent without evidence for dissection. Mild for age atheromatous irregularity about the left carotid bulb without hemodynamically significant stenosis. Vertebral arteries: Both vertebral arteries arise from subclavian arteries. The proximal left subclavian artery is incompletely visualized on this exam. Vertebral arteries M cells are patent within the neck without stenosis or evidence for dissection. Other: None IMPRESSION: MRI HEAD IMPRESSION: 1. No acute intracranial infarct or other abnormality. 2. Mild age-related cerebral atrophy with chronic small vessel ischemic disease, with a few small remote left cerebellar infarcts. 3. Subtle siderosis about the cerebellar vermis, suggesting prior subarachnoid hemorrhage. MRA HEAD IMPRESSION: 1. Negative  intracranial MRA for acute large vessel occlusion. 2. Occlusion of the intradural left V4 segment beyond the takeoff of the left PICA, presumably chronic in nature given the lack of acute ischemia. 3. Intracranial atherosclerotic disease with associated moderate to severe bilateral M2 and P2 stenoses as above. MRA NECK IMPRESSION: Wide patency of both carotid artery systems and vertebral arteries within the neck. No hemodynamically significant stenosis or other acute vascular abnormality. Electronically Signed   By: Rise Mu M.D.   On: 12/30/2022 01:35   MR Angiogram Neck W or Wo Contrast  Result Date: 12/30/2022 CLINICAL DATA:  Initial evaluation for TIA, mental status change. EXAM: MRI HEAD WITHOUT CONTRAST MRA HEAD WITHOUT CONTRAST MRA NECK WITHOUT AND WITH CONTRAST TECHNIQUE: Multiplanar, multi-echo pulse sequences of the brain and surrounding structures were acquired without intravenous contrast. Angiographic images of the Circle of Willis were acquired using MRA technique without intravenous contrast. Angiographic images of the neck were acquired using MRA technique without and with intravenous contrast. Carotid stenosis measurements (when applicable) are obtained utilizing NASCET criteria, using the distal internal carotid diameter as the denominator. CONTRAST:  9mL GADAVIST GADOBUTROL 1 MMOL/ML IV SOLN COMPARISON:  Prior CT from 12/29/2022. FINDINGS: MRI HEAD FINDINGS Brain: Mild age-related cerebral atrophy. Patchy T2/FLAIR hyperintensity involving the periventricular and deep white matter both cerebral hemispheres as well as the pons, consistent with chronic small vessel ischemic disease, mild for age. Few small remote left cerebellar infarcts noted. No evidence for acute or subacute ischemia. Gray-white matter differentiation maintained. No other areas of chronic cortical infarction. No acute intracranial hemorrhage. Subtle siderosis noted about the cerebellar vermis, suggesting prior  subarachnoid hemorrhage. Few additional punctate chronic micro hemorrhages noted within the right cerebellum and right temporal lobe. No mass lesion, midline shift or mass effect no hydrocephalus or extra-axial fluid collection. Pituitary gland and suprasellar region within normal limits. Vascular: Loss of normal flow void within the left vertebral artery, consistent with slow flow and/or occlusion. Major intracranial vascular flow voids are otherwise maintained. Skull and upper cervical spine: Craniocervical junction within normal limits. Bone marrow signal intensity normal. No scalp soft tissue abnormality. Sinuses/Orbits: Prior bilateral ocular lens replacement. Scattered mucosal thickening noted about the ethmoidal air cells. Paranasal sinuses are otherwise largely clear. Moderate left mastoid effusion, of uncertain significance. Other: None. MRA HEAD FINDINGS Anterior circulation: Both internal carotid arteries are patent through the  siphons without significant stenosis or other abnormality. A1 segments patent bilaterally. Normal anterior communicating complex. Anterior cerebral arteries patent without stenosis. No significant M1 stenosis. Left M1 bifurcates early. Focal moderate proximal left M2 stenosis, superior division (series 10 20, image 1). Focal severe proximal right M2 stenosis, inferior division (series 10 20, image 1). MCA branches are otherwise patent and well perfused, although demonstrates small vessel atheromatous irregularity. Posterior circulation: Right vertebral artery dominant and widely patent as it courses into the cranial vault. Mild stenosis noted just prior to the vertebrobasilar junction. Left V4 segment is hypoplastic and not well seen on this portion of the exam. On corresponding MRA of the neck, the left vertebral artery appears to be patent to the skull base. Attenuated flow within the proximal left V4 segment with some perfusion of the left PICA. Left V4 segment occluded  distally. Neither PICA is well visualized. Basilar mildly irregular but patent without significant stenosis. Superior cerebral arteries patent bilaterally. Both PCAs primarily supplied via the basilar. Short-segment moderate right and severe left proximal P2 stenoses (series 1010, image 6). PCAs are irregular but remain patent to their distal aspects. Anatomic variants: As above.  No aneurysm. MRA NECK FINDINGS Aortic arch: Visualized aortic arch normal caliber with standard branch pattern. No stenosis about the origin the great vessels. Right carotid system: Right common and internal carotid arteries are patent without evidence for dissection. Minor for age atheromatous irregularity about the right carotid bulb without hemodynamically significant stenosis. Left carotid system: Left common and internal carotid arteries are patent without evidence for dissection. Mild for age atheromatous irregularity about the left carotid bulb without hemodynamically significant stenosis. Vertebral arteries: Both vertebral arteries arise from subclavian arteries. The proximal left subclavian artery is incompletely visualized on this exam. Vertebral arteries M cells are patent within the neck without stenosis or evidence for dissection. Other: None IMPRESSION: MRI HEAD IMPRESSION: 1. No acute intracranial infarct or other abnormality. 2. Mild age-related cerebral atrophy with chronic small vessel ischemic disease, with a few small remote left cerebellar infarcts. 3. Subtle siderosis about the cerebellar vermis, suggesting prior subarachnoid hemorrhage. MRA HEAD IMPRESSION: 1. Negative intracranial MRA for acute large vessel occlusion. 2. Occlusion of the intradural left V4 segment beyond the takeoff of the left PICA, presumably chronic in nature given the lack of acute ischemia. 3. Intracranial atherosclerotic disease with associated moderate to severe bilateral M2 and P2 stenoses as above. MRA NECK IMPRESSION: Wide patency of both  carotid artery systems and vertebral arteries within the neck. No hemodynamically significant stenosis or other acute vascular abnormality. Electronically Signed   By: Rise Mu M.D.   On: 12/30/2022 01:35   CT HEAD WO CONTRAST  Result Date: 12/29/2022 CLINICAL DATA:  Transient ischemic attack. EXAM: CT HEAD WITHOUT CONTRAST TECHNIQUE: Contiguous axial images were obtained from the base of the skull through the vertex without intravenous contrast. RADIATION DOSE REDUCTION: This exam was performed according to the departmental dose-optimization program which includes automated exposure control, adjustment of the mA and/or kV according to patient size and/or use of iterative reconstruction technique. COMPARISON:  09/29/2020. FINDINGS: Brain: There is no evidence for acute hemorrhage, hydrocephalus, mass lesion, or abnormal extra-axial fluid collection. No definite CT evidence for acute infarction. Diffuse loss of parenchymal volume is consistent with atrophy. Patchy low attenuation in the deep hemispheric and periventricular white matter is nonspecific, but likely reflects chronic microvascular ischemic demyelination. Chronic lacunar infarcts are seen in the basal ganglia bilaterally. Vascular: No hyperdense vessel or unexpected  calcification. Skull: No evidence for fracture. No worrisome lytic or sclerotic lesion. Sinuses/Orbits: Left mastoid effusion is new in the interval. Right mastoid air cells are clear. Paranasal sinuses are clear. Visualized portions of the globes and intraorbital fat are unremarkable. Other: None. IMPRESSION: 1. Left mastoid effusion.  New since prior study. 2. No acute intracranial abnormality. 3. Atrophy with chronic small vessel white matter ischemic disease. Electronically Signed   By: Kennith Center M.D.   On: 12/29/2022 18:12    Pending Labs Unresulted Labs (From admission, onward)    None       Vitals/Pain Today's Vitals   12/30/22 0400 12/30/22 0500 12/30/22  0729 12/30/22 0836  BP: (!) 156/110 (!) 160/85 (!) 142/66 (!) 88/54  Pulse: 74 68  69  Resp: 14 (!) 21  18  Temp: 97.6 F (36.4 C)   97.7 F (36.5 C)  TempSrc: Oral   Oral  SpO2: 100% 100%  100%  Weight:      Height:      PainSc: 0-No pain   0-No pain    Isolation Precautions No active isolations  Medications Medications  carbidopa-levodopa (SINEMET IR) 25-100 MG per tablet immediate release 2 tablet (2 tablets Oral Given 12/29/22 2140)   stroke: early stages of recovery book (has no administration in time range)  acetaminophen (TYLENOL) tablet 650 mg (has no administration in time range)    Or  acetaminophen (TYLENOL) 160 MG/5ML solution 650 mg (has no administration in time range)    Or  acetaminophen (TYLENOL) suppository 650 mg (has no administration in time range)  senna-docusate (Senokot-S) tablet 1 tablet (has no administration in time range)  enoxaparin (LOVENOX) injection 40 mg (has no administration in time range)  ondansetron (ZOFRAN) injection 4 mg (has no administration in time range)  aspirin EC tablet 81 mg (has no administration in time range)  hydrALAZINE (APRESOLINE) injection 10 mg (10 mg Intravenous Given 12/30/22 0138)  rosuvastatin (CRESTOR) tablet 10 mg (has no administration in time range)  donepezil (ARICEPT) tablet 5 mg (5 mg Oral Given 12/30/22 0137)  dorzolamide-timolol (COSOPT) 2-0.5 % ophthalmic solution 1 drop (has no administration in time range)  latanoprost (XALATAN) 0.005 % ophthalmic solution 1 drop (has no administration in time range)  fludrocortisone (FLORINEF) tablet 0.1 mg (has no administration in time range)  gadobutrol (GADAVIST) 1 MMOL/ML injection 9 mL (9 mLs Intravenous Contrast Given 12/30/22 0044)  potassium chloride SA (KLOR-CON M) CR tablet 40 mEq (40 mEq Oral Given 12/30/22 0655)    Mobility walks with device     Focused Assessments Cardiac Assessment Handoff:    No results found for: "CKTOTAL", "CKMB", "CKMBINDEX",  "TROPONINI" No results found for: "DDIMER" Does the Patient currently have chest pain? No   , Pulmonary Assessment Handoff:  Lung sounds:   O2 Device: Room Air      R Recommendations: See Admitting Provider Note  Report given to:   Additional Notes:  He is orthostatic.

## 2022-12-30 NOTE — Evaluation (Signed)
Occupational Therapy Evaluation Patient Details Name: Seth Carter MRN: [id number] DOB: 14-Sep-1940 Today's Date: 12/30/2022   History of Present Illness 82 yo male reports difficulty speaking with abrupt facial droop and unsteady gait for 10 minutes. PMH parkinsons, atrophy and chronic microvascular ischemia, prior syncopal spells AKI bradykinesia, enlarged prostate, glaucoma bil, macular degeneration R eye TIA in 07/2022   Clinical Impression   PT admitted with weakness and difficulty speeching. Pt currently with functional limitiations due to the deficits listed below (see OT problem list). Pt at baseline walks without DME and attends [employer] x2 a week. Pt does not drive by choice but does not have restrictions. Pt currently with delayed word retrieval and noted to have orthostatic BP during position changes.  Pt will benefit from skilled OT to increase their independence and safety with adls and balance to allow discharge outpatient pending progress.       Recommendations for follow up therapy are one component of a multi-disciplinary discharge planning process, led by the attending physician.  Recommendations may be updated based on patient status, additional functional criteria and insurance authorization.   Assistance Recommended at Discharge PRN  Patient can return home with the following A little help with walking and/or transfers;A little help with bathing/dressing/bathroom    Functional Status Assessment  Patient has had a recent decline in their functional status and demonstrates the ability to make significant improvements in function in a reasonable and predictable amount of time.  Equipment Recommendations  None recommended by OT    Recommendations for Other Services       Precautions / Restrictions Precautions Precautions: Fall Precaution Comments: watch BP Restrictions Weight Bearing Restrictions: No      Mobility Bed Mobility Overal bed mobility: Needs  Assistance Bed Mobility: Rolling, Supine to Sit, Sit to Supine Rolling: Supervision   Supine to sit: Min assist Sit to supine: Min assist   General bed mobility comments: pt with posterior LOB and small input for forward flexion to continue to progress OOB. pt static sitting eob supervision level. pt standing min guard with RW. pt with (A) to swing back into bed surface by PT    Transfers Overall transfer level: Needs assistance Equipment used: Rolling walker (2 wheels) Transfers: Sit to/from Stand Sit to Stand: Min guard           General transfer comment: pt does not use DME at baseline. 2 person (A) due to weakness reported from RN staff and near fall      Balance Overall balance assessment: Mild deficits observed, not formally tested                                         ADL either performed or assessed with clinical judgement   ADL Overall ADL's : Needs assistance/impaired Eating/Feeding: Modified independent;Bed level Eating/Feeding Details (indicate cue type and reason): eating with increased time. pt was able to pour syrup and self feed oatmeal Grooming: Wash/dry hands;Set up;Bed level   Upper Body Bathing: Minimal assistance   Lower Body Bathing: Moderate assistance   Upper Body Dressing : Minimal assistance   Lower Body Dressing: Moderate assistance     Toilet Transfer Details (indicate cue type and reason): pt able to power up from eob min guard (A) but orthostatic so not tested fully at this time           General ADL Comments:  pt progressed from supine to sitting EOB and standing. pt with orthostatic with delayed onset of symptoms. pt returned to supine for safety.     Vision Baseline Vision/History: 3 Glaucoma;6 Macular Degeneration;1 Wears glasses Vision Assessment?: Vision impaired- to be further tested in functional context Additional Comments: pt with a very static stare to his gaze during session. pt reports he does not  have his glasses and he sees best with his glasses     Perception     Praxis      Pertinent Vitals/Pain Pain Assessment Pain Assessment: Faces Faces Pain Scale: Hurts a little bit Pain Location: eyes Pain Descriptors / Indicators: Discomfort Pain Intervention(s): Monitored during session, Repositioned, Other (comment) (pt reports eyes hurt without eye drops. found eye drops in bag and notified the RN)     Hand Dominance Right   Extremity/Trunk Assessment Upper Extremity Assessment Upper Extremity Assessment: Overall WFL for tasks assessed   Lower Extremity Assessment Lower Extremity Assessment: Defer to PT evaluation   Cervical / Trunk Assessment Cervical / Trunk Assessment: Kyphotic   Communication Communication Communication:  (delayed in his word finding but able to communicate needs. Pt reports that his speech is worse than normal.)   Cognition Arousal/Alertness: Awake/alert Behavior During Therapy: WFL for tasks assessed/performed Overall Cognitive Status: Impaired/Different from baseline Area of Impairment: Safety/judgement                         Safety/Judgement: Decreased awareness of deficits           General Comments  RA    Exercises     Shoulder Instructions      Home Living Family/patient expects to be discharged to:: Private residence Living Arrangements: Spouse/significant other Available Help at Discharge: Family;Available 24 hours/day Type of Home: House Home Access: Stairs to enter [employer] of Steps: 15 Entrance Stairs-Rails: Can reach both Home Layout: Able to live on main level with bedroom/bathroom;Multi-level (split leve style home with additional bedrooms, rec room and a workshop on the lower level) Alternate Level Stairs-Number of Steps: 15   Bathroom Shower/Tub: Producer, television/film/video: Handicapped height (x1 in the house is standard)     Home Equipment: Pharmacist, hospital (2  wheels);BSC/3in1;Grab bars - toilet   Additional Comments: dog = Daisy      Prior Functioning/Environment Prior Level of Function : Independent/Modified Independent             Mobility Comments: reports he is not restricted from driving but chooses not to drive, participates in Parkinsons classes at [employer] x2 per week. had a fall in [place] 07/2022 (2 total in the last 6 months) ADLs Comments: indep        OT Problem List: Decreased strength;Decreased activity tolerance;Impaired balance (sitting and/or standing);Impaired vision/perception;Decreased coordination;Decreased cognition;Decreased safety awareness;Decreased knowledge of precautions;Decreased knowledge of use of DME or AE      OT Treatment/Interventions: Self-care/ADL training;Energy conservation;DME and/or AE instruction;Therapeutic activities;Cognitive remediation/compensation;Patient/family education;Balance training;Visual/perceptual remediation/compensation;Therapeutic exercise;Manual therapy    OT Goals(Current goals can be found in the care plan section) Acute Rehab OT Goals Patient Stated Goal: to return home- return to rockboxing classes OT Goal Formulation: With patient Time For Goal Achievement: 01/13/23 Potential to Achieve Goals: Good  OT Frequency: Min 2X/week    Co-evaluation PT/OT/SLP Co-Evaluation/Treatment: Yes Reason for Co-Treatment: To address functional/ADL transfers   OT goals addressed during session: ADL's and self-care      AM-PAC OT "  6 Clicks" Daily Activity     Outcome Measure Help from another person eating meals?: None Help from another person taking care of personal grooming?: None Help from another person toileting, which includes using toliet, bedpan, or urinal?: A Lot Help from another person bathing (including washing, rinsing, drying)?: A Lot Help from another person to put on and taking off regular upper body clothing?: A Little Help from another person to  put on and taking off regular lower body clothing?: A Lot 6 Click Score: 17   End of Session Equipment Utilized During Treatment: Gait belt;Rolling walker (2 wheels) Nurse Communication: Mobility status;Precautions  Activity Tolerance: Patient tolerated treatment well Patient left: in bed;with call bell/phone within reach;with bed alarm set  OT Visit Diagnosis: Unsteadiness on feet (R26.81);Muscle weakness (generalized) (M62.81)                Time: 4098-1191 OT Time Calculation (min): 37 min Charges:  OT General Charges $OT Visit: 1 Visit OT Evaluation $OT Eval Moderate Complexity: 1 Mod   Brynn, OTR/L  Acute Rehabilitation Services Office: [phone] .   Mateo Flow 12/30/2022, 8:50 AM

## 2022-12-31 DIAGNOSIS — I951 Orthostatic hypotension: Secondary | ICD-10-CM | POA: Diagnosis not present

## 2022-12-31 DIAGNOSIS — R29818 Other symptoms and signs involving the nervous system: Secondary | ICD-10-CM | POA: Diagnosis not present

## 2022-12-31 NOTE — TOC Initial Note (Addendum)
Transition of Care [facility]) - Initial/Assessment Note    Patient Details  Name: Seth Carter MRN: [id number] Date of Birth: 11/18/1940  [facility] CM/SW Contact:    Seth Bacon, Carter Phone Number: 12/31/2022, 8:55 AM  Clinical Narrative:   Spoke with patient at bedside. Patient had daughter, Seth Carter, on speaker phone.  Daughter reports, patient lives at home with spouse, Seth Carter, and confirmed that patient has RW, WC, BSC, and rollator at home. Daughter will transport patient home and is coming to visit this morning. PCP is Dr. Margaretann Loveless with [facility] Internal medicine, he also sees a Dr. Penelope Coop @ [facility].   Daughter request [facility] for [facility] PT/OT services since they are already coming out to the house for his spouse. Will contact [facility] to see if they will accept patient to their service.     [id number] Cower with [facility] notified of patient preference. Awaiting if able to accept patient for [facility] PT/OT services.        [id number] Cower with [facility] confirmed they can accept pt to their service for [facility] PT/OT.       Expected Discharge Plan: Home w Home Health Services Barriers to Discharge: Continued Medical Work up   Patient Goals and CMS Choice Patient states their goals for this hospitalization and ongoing recovery are:: To go home          Expected Discharge Plan and Services   Discharge Planning Services: CM Consult   Living arrangements for the past 2 months: Single Family Home                                      Prior Living Arrangements/Services Living arrangements for the past 2 months: Single Family Home Lives with:: Spouse Patient language and need for interpreter reviewed:: Yes Do you feel safe going back to the place where you live?: Yes      Need for Family Participation in Patient Care: Yes (Comment) Care giver support system in place?: Yes (comment) Current home services: DME (RW, Rollator, BSC, WC) Criminal Activity/Legal Involvement Pertinent to Current  Situation/Hospitalization: No - Comment as needed  Activities of Daily Living Home Assistive Devices/Equipment: Other (Comment) (rollator?) ADL Screening (condition at time of admission) Patient's cognitive ability adequate to safely complete daily activities?: Yes Is the patient deaf or have difficulty hearing?: No Does the patient have difficulty seeing, even when wearing glasses/contacts?: No Does the patient have difficulty concentrating, remembering, or making decisions?: Yes Patient able to express need for assistance with ADLs?: Yes Does the patient have difficulty dressing or bathing?: Yes Independently performs ADLs?: No Does the patient have difficulty walking or climbing stairs?: Yes Weakness of Legs: Both (tremors d/t parkinson) Weakness of Arms/Hands: Both (tremors d/t parkinson)  Permission Sought/Granted Permission sought to share information with : Case Manager, Photographer granted to share info w AGENCY: [facility]        Emotional Assessment Appearance:: Appears stated age Attitude/Demeanor/Rapport: Engaged Affect (typically observed): Appropriate Orientation: : Oriented to Self, Oriented to Place, Oriented to  Time, Oriented to Situation Alcohol / Substance Use: Not Applicable Psych Involvement: No (comment)  Admission diagnosis:  Transient alteration of awareness [R40.4] Hypertensive urgency [I16.0] Stroke-like symptoms [R29.90] Patient Active Problem List   Diagnosis Date Noted   Stroke-like symptoms 12/30/2022   Orthostatic hypotension 12/30/2022   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  disease 12/15/2020   Allergic rhinitis    Chronic sinusitis    Enlarged prostate    Hardening of the aorta (main artery of the heart)    Vitamin B12 deficiency    Seizure-like activity 09/29/2020   Parkinson's disease 09/29/2020   Leukocytosis 09/29/2020   Benign prostatic hyperplasia with lower urinary tract symptoms  09/23/2020   Degeneration of lumbar intervertebral disc 08/20/2020   Spinal stenosis of lumbar region 08/20/2020   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/2019   Hemorrhoids 12/18/2019   Vitamin D deficiency 12/18/2019   Male erectile dysfunction 12/18/2019   Osteoarthritis of knee 12/18/2019   Impaired fasting glucose 12/18/2019   Gastroesophageal reflux disease without esophagitis 12/18/2019   Essential hypertension 12/18/2019   Mixed hyperlipidemia 12/18/2019   Fatigue 12/18/2019   Pain in hand 12/18/2019   Onychomycosis 12/18/2019   Cervical spondylosis without myelopathy 12/18/2019   Toxic effect of venom of wasps 12/18/2019   Right flank pain 12/18/2019   Family history of hemochromatosis 12/18/2019   Pure hypercholesterolemia 12/18/2019   Hyperglycemia 12/18/2019   Shoulder joint pain 01/29/2019   PCP:  Marden Noble, MD (Inactive) Pharmacy:   [id number] - [place], [place] - [address] [address] Phone: [phone] Fax: [phone]     Social Determinants of Health (SDOH) Social History: SDOH Screenings   Food Insecurity: No Food Insecurity (12/30/2022)  Housing: Low Risk  (12/30/2022)  Transportation Needs: No Transportation Needs (12/30/2022)  Utilities: Not At Risk (12/30/2022)  Depression (PHQ2-9): Low Risk  (12/18/2019)  Tobacco Use: Low Risk  (11/28/2022)   SDOH Interventions:     Readmission Risk Interventions     No data to display

## 2022-12-31 NOTE — Plan of Care (Signed)
  Problem: Education: Goal: Knowledge of disease or condition will improve Outcome: Not Progressing Goal: Knowledge of secondary prevention will improve (MUST DOCUMENT ALL) Outcome: Progressing Goal: Knowledge of patient specific risk factors will improve Loraine Leriche N/A or DELETE if not current risk factor) Outcome: Progressing   Problem: Ischemic Stroke/TIA Tissue Perfusion: Goal: Complications of ischemic stroke/TIA will be minimized Outcome: Progressing   Problem: Coping: Goal: Will verbalize positive feelings about self Outcome: Progressing Goal: Will identify appropriate support needs Outcome: Progressing   Problem: Health Behavior/Discharge Planning: Goal: Ability to manage health-related needs will improve Outcome: Progressing Goal: Goals will be collaboratively established with patient/family Outcome: Progressing   Problem: Self-Care: Goal: Ability to participate in self-care as condition permits will improve Outcome: Progressing Goal: Verbalization of feelings and concerns over difficulty with self-care will improve Outcome: Progressing Goal: Ability to communicate needs accurately will improve Outcome: Progressing   Problem: Education: Goal: Knowledge of General Education information will improve Description: Including pain rating scale, medication(s)/side effects and non-pharmacologic comfort measures Outcome: Progressing   Problem: Health Behavior/Discharge Planning: Goal: Ability to manage health-related needs will improve Outcome: Progressing   Problem: Clinical Measurements: Goal: Ability to maintain clinical measurements within normal limits will improve Outcome: Progressing Goal: Will remain free from infection Outcome: Progressing Goal: Diagnostic test results will improve Outcome: Progressing Goal: Respiratory complications will improve Outcome: Progressing Goal: Cardiovascular complication will be avoided Outcome: Progressing   Problem:  Activity: Goal: Risk for activity intolerance will decrease Outcome: Progressing   Problem: Nutrition: Goal: Adequate nutrition will be maintained Outcome: Progressing   Problem: Coping: Goal: Level of anxiety will decrease Outcome: Progressing   Problem: Pain Managment: Goal: General experience of comfort will improve Outcome: Progressing   Problem: Safety: Goal: Ability to remain free from injury will improve Outcome: Progressing

## 2023-01-01 DIAGNOSIS — R29818 Other symptoms and signs involving the nervous system: Secondary | ICD-10-CM | POA: Diagnosis not present

## 2023-01-01 DIAGNOSIS — I951 Orthostatic hypotension: Secondary | ICD-10-CM | POA: Diagnosis not present

## 2023-01-01 MED ORDER — FLUDROCORTISONE ACETATE 0.1 MG PO TABS: 0.1000 mg | ORAL_TABLET | ORAL | 1 refills | Status: DC

## 2023-01-01 NOTE — Plan of Care (Signed)
Problem: Education: Goal: Knowledge of disease or condition will improve Outcome: Not Progressing Goal: Knowledge of secondary prevention will improve (MUST DOCUMENT ALL) Outcome: Progressing Goal: Knowledge of patient specific risk factors will improve Loraine Leriche N/A or DELETE if not current risk factor) Outcome: Progressing   Problem: Ischemic Stroke/TIA Tissue Perfusion: Goal: Complications of ischemic stroke/TIA will be minimized Outcome: Progressing   Problem: Coping: Goal: Will verbalize positive feelings about self Outcome: Progressing Goal: Will identify appropriate support needs Outcome: Progressing   Problem: Health Behavior/Discharge Planning: Goal: Ability to manage health-related needs will improve Outcome: Progressing Goal: Goals will be collaboratively established with patient/family Outcome: Progressing   Problem: Self-Care: Goal: Ability to participate in self-care as condition permits will improve Outcome: Progressing Goal: Verbalization of feelings and concerns over difficulty with self-care will improve Outcome: Progressing Goal: Ability to communicate needs accurately will improve Outcome: Progressing   Problem: Education: Goal: Knowledge of General Education information will improve Description: Including pain rating scale, medication(s)/side effects and non-pharmacologic comfort measures Outcome: Progressing   Problem: Health Behavior/Discharge Planning: Goal: Ability to manage health-related needs will improve Outcome: Progressing   Problem: Clinical Measurements: Goal: Ability to maintain clinical measurements within normal limits will improve Outcome: Progressing Goal: Will remain free from infection Outcome: Progressing Goal: Diagnostic test results will improve Outcome: Progressing Goal: Respiratory complications will improve Outcome: Progressing Goal: Cardiovascular complication will be avoided Outcome: Progressing   Problem:  Activity: Goal: Risk for activity intolerance will decrease Outcome: Progressing   Problem: Nutrition: Goal: Adequate nutrition will be maintained Outcome: Progressing   Problem: Coping: Goal: Level of anxiety will decrease Outcome: Progressing   Problem: Pain Managment: Goal: General experience of comfort will improve Outcome: Progressing   Problem: Safety: Goal: Ability to remain free from injury will improve Outcome: Progressing

## 2023-01-01 NOTE — Progress Notes (Signed)
Occupational Therapy Treatment Patient Details Name: Seth Carter MRN: [id number] DOB: July 28, 1940 Today's Date: 01/01/2023   History of present illness 82 yo male reports 12/29/22 difficulty speaking with abrupt facial droop and unsteady gait for 10 minutes. CT head was negative for acute intracranial abnormalities.  MRI brain negative for stroke.  MRA head and neck was also done. PMH parkinsons, atrophy and chronic microvascular ischemia, prior syncopal spells AKI bradykinesia, enlarged prostate, glaucoma bil, macular degeneration R eye TIA in 07/2022   OT comments  The patient remains very orthostatic and experiences no symptoms to the SBP changes. Pt sustained a HR in 50s the entire session. Pt progressing well with transfers with minimal assistance. Pt remains high fall risk for home d/c dur to orthostatic blood pressure. Recommendations for outpatient remain appropriate.    Recommendations for follow up therapy are one component of a multi-disciplinary discharge planning process, led by the attending physician.  Recommendations may be updated based on patient status, additional functional criteria and insurance authorization.    Assistance Recommended at Discharge PRN  Patient can return home with the following  A little help with walking and/or transfers;A little help with bathing/dressing/bathroom   Equipment Recommendations  None recommended by OT    Recommendations for Other Services      Precautions / Restrictions Precautions Precautions: Fall Precaution Comments: watch BP Restrictions Weight Bearing Restrictions: No       Mobility Bed Mobility Overal bed mobility: Independent                  Transfers Overall transfer level: Modified independent                 General transfer comment: cues for hand placement and close monitoring of BP     Balance                                           ADL either performed or assessed with  clinical judgement   ADL Overall ADL's : Needs assistance/impaired Eating/Feeding: Independent   Grooming: Wash/dry Lawyer: Supervision/safety           Functional mobility during ADLs: Supervision/safety;Rolling walker (2 wheels)      Extremity/Trunk Assessment Upper Extremity Assessment Upper Extremity Assessment: Overall WFL for tasks assessed   Lower Extremity Assessment Lower Extremity Assessment: Defer to PT evaluation        Vision       Perception     Praxis      Cognition Arousal/Alertness: Awake/alert Behavior During Therapy: WFL for tasks assessed/performed Overall Cognitive Status: Impaired/Different from baseline Area of Impairment: Safety/judgement, Problem solving                             Problem Solving: Slow processing          Exercises      Shoulder Instructions       General Comments orthostatic vitals listed in the vital section. pt with large SBP drops and not symptoms. pt is high riskf or falls due to lack of awareness    Pertinent Vitals/ Pain       Pain Assessment Pain Assessment: No/denies pain  Home Living  Prior Functioning/Environment              Frequency  Min 2X/week        Progress Toward Goals  OT Goals(current goals can now be found in the care plan section)  Progress towards OT goals: Progressing toward goals  Acute Rehab OT Goals Patient Stated Goal: to have a bowel movement-- feels that he is currently constipated OT Goal Formulation: With patient Time For Goal Achievement: 01/13/23 Potential to Achieve Goals: Good ADL Goals Pt Will Perform Grooming: with modified independence;sitting Pt Will Perform Upper Body Dressing: sitting Pt Will Perform Lower Body Dressing: with supervision;sit to/from stand Pt Will Transfer to Toilet: with supervision;ambulating;regular height  toilet Additional ADL Goal #1: pt will complete bed mobility mod I as precursor to adls  Plan Discharge plan remains appropriate    Co-evaluation                 AM-PAC OT "6 Clicks" Daily Activity     Outcome Measure   Help from another person eating meals?: None Help from another person taking care of personal grooming?: None Help from another person toileting, which includes using toliet, bedpan, or urinal?: A Little Help from another person bathing (including washing, rinsing, drying)?: A Little Help from another person to put on and taking off regular upper body clothing?: A Little Help from another person to put on and taking off regular lower body clothing?: A Little 6 Click Score: 20    End of Session Equipment Utilized During Treatment: Gait belt;Rolling walker (2 wheels)  OT Visit Diagnosis: Unsteadiness on feet (R26.81);Muscle weakness (generalized) (M62.81)   Activity Tolerance Patient tolerated treatment well   Patient Left in bed;with call bell/phone within reach;with bed alarm set   Nurse Communication Mobility status;Precautions        Time: 4098-1191 OT Time Calculation (min): 19 min  Charges: OT General Charges $OT Visit: 1 Visit OT Treatments $Self Care/Home Management : 8-22 mins   Brynn, OTR/L  Acute Rehabilitation Services Office: [phone] .   Mateo Flow 01/01/2023, 3:50 PM

## 2023-01-01 NOTE — Progress Notes (Signed)
Physical Therapy Treatment Patient Details Name: Seth Carter MRN: [id number] DOB: Sep 26, 1940 Today's Date: 01/01/2023   History of Present Illness 82 yo male reports 12/29/22 difficulty speaking with abrupt facial droop and unsteady gait for 10 minutes. CT head was negative for acute intracranial abnormalities.  MRI brain negative for stroke.  MRA head and neck was also done. PMH parkinsons, atrophy and chronic microvascular ischemia, prior syncopal spells AKI bradykinesia, enlarged prostate, glaucoma bil, macular degeneration R eye TIA in 07/2022    PT Comments    PTA pt reports appearing at [facility] 2x a week and uses an AD (rollator/RW) PRN. Orthostatics today were measured; Rest (supine): BP 198/81 (115) w HR 52 BPM, Seated: BP 198/81 (108) w HR 49 BPM, Standing: BP 117/68 (82) w HR 55 BPM. Pt reports no adverse symptoms at this time so PT went forward with mobilization. Currently pt requires supervision for bed mobility, and min G for transfers, ambulation of 200', and stairs. Pt benefits from verbal cues for AD management, improved gait pattern, and to initiate expression of any adverse symptoms they may be experiencing. Post amb BP recorded at 153/82 (102) w HR 54 BPM. Pt will continue to benefit from skilled therapy to facilitate their functional mobility and the listed impairments below.    Recommendations for follow up therapy are one component of a multi-disciplinary discharge planning process, led by the attending physician.  Recommendations may be updated based on patient status, additional functional criteria and insurance authorization.  Follow Up Recommendations       Assistance Recommended at Discharge Frequent or constant Supervision/Assistance  Patient can return home with the following Help with stairs or ramp for entrance;Assist for transportation;Assistance with cooking/housework;A little help with walking and/or transfers   Equipment Recommendations  None recommended by  PT    Recommendations for Other Services       Precautions / Restrictions Precautions Precautions: Fall Precaution Comments: watch BP Restrictions Weight Bearing Restrictions: No     Mobility  Bed Mobility Overal bed mobility: Needs Assistance Bed Mobility: Supine to Sit     Supine to sit: Min assist     General bed mobility comments: Min A for supine<>sit w required verbal cueing for BLE navigation, increased time.    Transfers Overall transfer level: Needs assistance Equipment used: Rolling walker (2 wheels) Transfers: Sit to/from Stand Sit to Stand: Min guard           General transfer comment: 2 attempts to sit<>stand to RW, tries to bilaterally pull on RW to stand, verbally cued to push from bed surface with corrections made.    Ambulation/Gait Ambulation/Gait assistance: Min guard Gait Distance (Feet): 200 Feet Assistive device: Rolling walker (2 wheels) Gait Pattern/deviations: Step-through pattern, Decreased stride length, Shuffle, Narrow base of support, Trunk flexed   Gait velocity interpretation: <1.8 ft/sec, indicate of risk for recurrent falls   General Gait Details: Verbal cues for incerased step length bilaterally with immediate correction but only maintains for a short period of time. Navigates bilateral turning, stays close proximity with RW.   Stairs Stairs: Yes Stairs assistance: Min guard Stair Management: Two rails Number of Stairs: 4 General stair comments: Alternating pattern for ascent and descent, increased time.   Wheelchair Mobility    Modified Rankin (Stroke Patients Only)       Balance Overall balance assessment: Mild deficits observed, not formally tested  Cognition Arousal/Alertness: Awake/alert Behavior During Therapy: WFL for tasks assessed/performed Overall Cognitive Status: Impaired/Different from baseline                            Safety/Judgement: Decreased awareness of deficits              Exercises      General Comments General comments (skin integrity, edema, etc.): Orthostatics: Rest (supine); BP 198/81 (115) w HR 52 BPM, Seated; BP 198/81 (108) w HR 49 BPM, Standing ; BP: 117/68 (82) w HR 55 BPM. Pt denies any symptoms throughout session. Post ambulation: BP 153/82 (102) w HR 54 BPM.      Pertinent Vitals/Pain Pain Assessment Pain Assessment: No/denies pain    Home Living                          Prior Function            PT Goals (current goals can now be found in the care plan section) Acute Rehab PT Goals Patient Stated Goal: return to home PT Goal Formulation: With patient Time For Goal Achievement: 01/13/23 Potential to Achieve Goals: Good Progress towards PT goals: Progressing toward goals    Frequency    Min 3X/week      PT Plan Current plan remains appropriate    Co-evaluation              AM-PAC PT "6 Clicks" Mobility   Outcome Measure  Help needed turning from your back to your side while in a flat bed without using bedrails?: A Little Help needed moving from lying on your back to sitting on the side of a flat bed without using bedrails?: A Little Help needed moving to and from a bed to a chair (including a wheelchair)?: A Little Help needed standing up from a chair using your arms (e.g., wheelchair or bedside chair)?: A Little Help needed to walk in hospital room?: A Little Help needed climbing 3-5 steps with a railing? : A Little 6 Click Score: 18    End of Session Equipment Utilized During Treatment: Gait belt Activity Tolerance: Patient tolerated treatment well Patient left: with call bell/phone within reach;with chair alarm set;in chair Nurse Communication: Other (comment) (Orthostatics, recommend Ted Hose) PT Visit Diagnosis: Unsteadiness on feet (R26.81);History of falling (Z91.81);Muscle weakness (generalized) (M62.81);Pain     Time:  6045-4098 PT Time Calculation (min) (ACUTE ONLY): 27 min  Charges:  $Gait Training: 8-22 mins $Therapeutic Activity: 8-22 mins                     Hendricks Milo, SPT  Acute Rehabilitation Services    Hendricks Milo 01/01/2023, 1:53 PM

## 2023-01-01 NOTE — Progress Notes (Signed)
[facility] PROGRESS NOTE   Seth Carter ZOX:[id number] DOB: 10-05-1940 DOA: 12/29/2022  PCP: Seth Noble, MD (Inactive)  Brief History/Interval Summary: 82 y.o. male with medical history significant for Parkinson's disease, neurogenic orthostatic hypotension on Florinef, TIA, BPH s/p TURP who presented to the ED for evaluation of change in mental status. Patient loaded golf cart down to the road to get trash cans when he became very dizzy.  He walked up to the house and was disoriented/confused.  Family noted this appeared similar to prior Parkinson's freeze.  He was walking stiffly with ataxic gait.  He was "not there mentally" for 10 minutes.  Family noted that he appeared to have left lower facial droop at the time.  He was not alert enough to get into the house until 10 minutes after symptoms began.  Patient states that he had difficulty getting his words out. He has a history of TIA in January 2024 in [place], [place].  He was on DAPT with aspirin and Plavix, then aspirin 81 mg alone after Plavix discontinued last month.  Patient however states that he has not been taking aspirin lately.  He was started on Florinef last month by his neurologist for management of orthostatic hypotension.  Patient reports improvement in most of his symptoms although he is still having some word finding difficulty.  Neurology was consulted.  Patient was hospitalized for further management.   Consultants: Neurology  Procedures: Echocardiogram    Subjective/Interval History: Patient without any new complaints.  Wants to go home.  Daughter is at the bedside.      Assessment/Plan:  Transient aphasia left-sided facial droop plan gait instability Differential diagnoses include TIA, orthostatic hypotension. CT head was negative for acute intracranial abnormalities.  MRI brain negative for stroke.  MRA head and neck was also done.  Results deferred to neurology. Echocardiogram is pending. LDL is 87.   Patient on rosuvastatin currently. HbA1c 5.4. PT and OT evaluation. It looks like patient was on aspirin and Plavix till about a month ago when Plavix was discontinued.  He may not have been taking aspirin on a regular basis as well.  Currently on aspirin. Neurology feels that his symptoms are more likely secondary to orthostatic hypotension rather than a TIA.  Recommend only aspirin for now.  Orthostatic hypotension Recently started on Florinef for orthostatic hypotension by his neurologist at [facility].  He was taken off of midodrine.  Elevated blood pressure noted.  We may need to tolerate high blood pressure and have him to avoid significant drop in his blood pressures due to orthostasis.  Continue with TED stockings.  Did not do well when he was mobilized out of bed.  Had to sit back on the bed after just a few minutes.  Patient's daughter would like for him to at least be able to walk to the bathroom and back without much assistance before she can take him back home.  Parkinson's disease Continue Sinemet.  PT and OT evaluation.  SLP evaluation.  DVT Prophylaxis: Lovenox Code Status: Full code Family Communication: Discussed with patient Disposition Plan: To be determined  Status is: Observation The patient remains OBS appropriate and may or may not d/c before 2 midnights.      Medications: Scheduled:  aspirin EC  81 mg Oral Daily   carbidopa-levodopa  2 tablet Oral TID   donepezil  5 mg Oral QHS   dorzolamide-timolol  1 drop Both Eyes BID   enoxaparin (LOVENOX) injection  40 mg  Subcutaneous Q24H   fludrocortisone  0.1 mg Oral Daily   latanoprost  1 drop Both Eyes QHS   rosuvastatin  10 mg Oral Daily   Continuous: [id number] **OR** acetaminophen (TYLENOL) oral liquid 160 mg/5 mL **OR** acetaminophen, hydrALAZINE, ondansetron (ZOFRAN) IV, senna-docusate  Antibiotics: Anti-infectives (From admission, onward)    None       Objective:  Vital Signs  Vitals:    12/31/22 2028 12/31/22 2338 01/01/23 0331 01/01/23 0746  BP: (!) 191/76 (!) 189/83 (!) 178/87 (!) 198/84  Pulse: (!) 55 (!) 52 61 (!) 53  Resp: 17 18 18 18   Temp: (!) 97.5 F (36.4 C) 97.7 F (36.5 C) 98.5 F (36.9 C) 97.8 F (36.6 C)  TempSrc: Oral Oral Oral Oral  SpO2: 98% 96% 98% 98%  Weight:      Height:        Intake/Output Summary (Last 24 hours) at 01/01/2023 0854 Last data filed at 12/31/2022 1634 Gross per 24 hour  Intake --  Output 25 ml  Net -25 ml   Filed Weights   12/29/22 2034  Weight: 83.9 kg   General appearance: Awake alert.  In no distress Resp: Clear to auscultation bilaterally.  Normal effort Cardio: S1-S2 is normal regular.  No S3-S4.  No rubs murmurs or bruit GI: Abdomen is soft.  Nontender nondistended.  Bowel sounds are present normal.  No masses organomegaly Extremities: No edema.     Lab Results:  Data Reviewed: I have personally reviewed following labs and reports of the imaging studies  CBC: Recent Labs  Lab 12/29/22 1629 12/29/22 1649 01/05/2023 0323  WBC 8.0  --  8.6  NEUTROABS 6.0  --   --   HGB 13.1 12.6* 13.5  HCT 38.6* 37.0* 38.9*  MCV 92.8  --  90.7  PLT 166  --  159     Basic Metabolic Panel: Recent Labs  Lab 12/29/22 1629 12/29/22 1649 01/05/23 0323  NA 139 139 138  K 4.1 4.2 3.5  CL 102 101 101  CO2 28  --  23  GLUCOSE 137* 135* 96  BUN 24* 26* 21  CREATININE 1.11 1.10 0.90  CALCIUM 9.5  --  9.3     GFR: Estimated Creatinine Clearance: 65.1 mL/min (by C-G formula based on SCr of 0.9 mg/dL).  Liver Function Tests: Recent Labs  Lab 12/29/22 1629  AST 19  ALT <5  ALKPHOS 62  BILITOT 0.6  PROT 6.6  ALBUMIN 3.7      Coagulation Profile: Recent Labs  Lab 12/29/22 1629  INR 1.2      HbA1C: Recent Labs    05-Jan-2023 0323  HGBA1C 5.4     CBG: Recent Labs  Lab 12/29/22 1652  GLUCAP 122*     Lipid Profile: Recent Labs    2023-01-05 0323  CHOL 151  HDL 48  LDLCALC 87  TRIG 79   CHOLHDL 3.1      Radiology Studies: EEG adult  Result Date: 2023-01-05 Jefferson Fuel, MD     January 05, 2023  2:39 PM Routine EEG Report Seth Carter is a 82 y.o. male with a history of altered mental status  who is undergoing an EEG to evaluate for seizures. Report: This EEG was acquired with electrodes placed according to the International 10-20 electrode system (including Fp1, Fp2, F3, F4, C3, C4, P3, P4, O1, O2, T3, T4, T5, T6, A1, A2, Fz, Cz, Pz). The following electrodes were missing or displaced: none. The occipital dominant rhythm was  7 Hz. This activity is reactive to stimulation. Drowsiness was manifested by background fragmentation; deeper stages of sleep were identified by K complexes and sleep spindles. There was no focal slowing. There were no interictal epileptiform discharges. There were no electrographic seizures identified. There was no abnormal response to photic stimulation or hyperventilation. Impression and clinical correlation: This EEG was obtained while awake and asleep and is abnormal due to mild diffuse slowing indicative of global cerebral dysfunction. Epileptiform abnormalities were not seen during this recording. Bing Neighbors, MD [facility] [phone] If 7pm- 7am, please page neurology on call as listed in AMION.   ECHOCARDIOGRAM COMPLETE  Result Date: 12/30/2022    ECHOCARDIOGRAM REPORT   Patient Name:   Seth Carter Guidotti Date of Exam: 12/30/2022 Medical Rec #:  [id number]    Height:       71.0 in Accession #:    [id number]   Weight:       185.0 lb Date of Birth:  11-28-1940     BSA:          2.040 m Patient Age:    82 years     BP:           160/85 mmHg Patient Gender: M            HR:           69 bpm. Exam Location:  Inpatient Procedure: 2D Echo, Cardiac Doppler and Color Doppler Indications:    TIA G45.9  History:        Patient has prior history of Echocardiogram examinations, most                 recent 01/20/2020. Risk Factors:Hypertension and Dyslipidemia.   Sonographer:    [name] Referring Phys: [id number] VISHAL R PATEL IMPRESSIONS  1. Left ventricular ejection fraction, by estimation, is 60 to 65%. The left ventricle has normal function. The left ventricle has no regional wall motion abnormalities. Left ventricular diastolic parameters are consistent with Grade I diastolic dysfunction (impaired relaxation).  2. Right ventricular systolic function is normal. The right ventricular size is normal.  3. Left atrial size was mildly dilated.  4. The mitral valve is normal in structure. No evidence of mitral valve regurgitation. No evidence of mitral stenosis.  5. The aortic valve is tricuspid. There is mild calcification of the aortic valve. There is mild thickening of the aortic valve. Aortic valve regurgitation is not visualized. No aortic stenosis is present.  6. Aortic dilatation noted. There is mild dilatation of the ascending aorta, measuring 38 mm. FINDINGS  Left Ventricle: Left ventricular ejection fraction, by estimation, is 60 to 65%. The left ventricle has normal function. The left ventricle has no regional wall motion abnormalities. The left ventricular internal cavity size was normal in size. There is  no left ventricular hypertrophy. Left ventricular diastolic parameters are consistent with Grade I diastolic dysfunction (impaired relaxation). Normal left ventricular filling pressure. Right Ventricle: The right ventricular size is normal. Right vetricular wall thickness was not well visualized. Right ventricular systolic function is normal. Left Atrium: Left atrial size was mildly dilated. Right Atrium: Right atrial size was normal in size. Pericardium: There is no evidence of pericardial effusion. Mitral Valve: The mitral valve is normal in structure. No evidence of mitral valve regurgitation. No evidence of mitral valve stenosis. Tricuspid Valve: The tricuspid valve is normal in structure. Tricuspid valve regurgitation is not demonstrated. No evidence  of tricuspid stenosis. Aortic Valve: The aortic valve is tricuspid.  There is mild calcification of the aortic valve. There is mild thickening of the aortic valve. There is mild aortic valve annular calcification. Aortic valve regurgitation is not visualized. No aortic stenosis  is present. Aortic valve mean gradient measures 4.6 mmHg. Aortic valve peak gradient measures 9.2 mmHg. Aortic valve area, by VTI measures 2.45 cm. Pulmonic Valve: The pulmonic valve was not well visualized. Pulmonic valve regurgitation is not visualized. No evidence of pulmonic stenosis. Aorta: The aortic root is normal in size and structure and aortic dilatation noted. There is mild dilatation of the ascending aorta, measuring 38 mm. Venous: The inferior vena cava was not well visualized. IAS/Shunts: No atrial level shunt detected by color flow Doppler.  LEFT VENTRICLE PLAX 2D LVIDd:         4.40 cm   Diastology LVIDs:         3.20 cm   LV e' medial:    4.20 cm/s LV PW:         1.00 cm   LV E/e' medial:  12.5 LV IVS:        1.00 cm   LV e' lateral:   9.68 cm/s LVOT diam:     2.20 cm   LV E/e' lateral: 5.4 LV SV:         78 LV SV Index:   38 LVOT Area:     3.80 cm  RIGHT VENTRICLE             IVC RV S prime:     16.60 cm/s  IVC diam: 1.90 cm TAPSE (M-mode): 3.1 cm LEFT ATRIUM             Index        RIGHT ATRIUM           Index LA diam:        3.10 cm 1.52 cm/m   RA Area:     17.00 cm LA Vol (A2C):   60.6 ml 29.71 ml/m  RA Volume:   48.30 ml  23.68 ml/m LA Vol (A4C):   70.0 ml 34.32 ml/m LA Biplane Vol: 75.1 ml 36.82 ml/m  AORTIC VALVE                    PULMONIC VALVE AV Area (Vmax):    2.63 cm     PR End Diast Vel: 2.57 msec AV Area (Vmean):   2.58 cm AV Area (VTI):     2.45 cm AV Vmax:           152.05 cm/s AV Vmean:          98.622 cm/s AV VTI:            0.316 m AV Peak Grad:      9.2 mmHg AV Mean Grad:      4.6 mmHg LVOT Vmax:         105.00 cm/s LVOT Vmean:        66.900 cm/s LVOT VTI:          0.204 m LVOT/AV VTI ratio:  0.65  AORTA Ao Root diam: 3.30 cm Ao Asc diam:  3.80 cm MITRAL VALVE                TRICUSPID VALVE MV Area (PHT): 2.91 cm     TR Peak grad:   5.4 mmHg MV Decel Time: 261 msec     TR Vmax:        116.00 cm/s MV E velocity: 52.40 cm/s  MV A velocity: 106.00 cm/s  SHUNTS MV E/A ratio:  0.49         Systemic VTI:  0.20 m                             Systemic Diam: 2.20 cm Dina Rich MD Electronically signed by Dina Rich MD Signature Date/Time: 12/30/2022/12:38:59 PM    Final        LOS: 0 days   [name]  [facility] Pager on [email or web address]  01/01/2023, 8:54 AM

## 2023-01-01 NOTE — Discharge Summary (Signed)
[facility]  Physician Discharge Summary   Patient ID: Seth Carter MRN: [id number] DOB/AGE: 23-Jan-1941 82 y.o.  Admit date: 12/29/2022 Discharge date:   01/01/2023   PCP: Marden Noble, MD (Inactive)  DISCHARGE DIAGNOSES:    Parkinson's disease   Orthostatic hypotension   RECOMMENDATIONS FOR OUTPATIENT FOLLOW UP: Patient to follow-up with his neurologist at [facility]: PT OT RN Equipment/Devices: None  CODE STATUS: Full code  DISCHARGE CONDITION: fair  Diet recommendation:.  As before  INITIAL HISTORY: 82 y.o. male with medical history significant for Parkinson's disease, neurogenic orthostatic hypotension on Florinef, TIA, BPH s/p TURP who presented to the ED for evaluation of change in mental status. Patient loaded golf cart down to the road to get trash cans when he became very dizzy.  He walked up to the house and was disoriented/confused.  Family noted this appeared similar to prior Parkinson's freeze.  He was walking stiffly with ataxic gait.  He was "not there mentally" for 10 minutes.  Family noted that he appeared to have left lower facial droop at the time.  He was not alert enough to get into the house until 10 minutes after symptoms began.  Patient states that he had difficulty getting his words out. He has a history of TIA in January 2024 in [place], [place].  He was on DAPT with aspirin and Plavix, then aspirin 81 mg alone after Plavix discontinued last month.  Patient however states that he has not been taking aspirin lately.  He was started on Florinef last month by his neurologist for management of orthostatic hypotension.  Patient reports improvement in most of his symptoms although he is still having some word finding difficulty.  Neurology was consulted.  Patient was hospitalized for further management.   Consultants: Neurology   Procedures: Echocardiogram  HOSPITAL COURSE:   Transient aphasia left-sided facial droop plan gait  instability Differential diagnoses include TIA, orthostatic hypotension. CT head was negative for acute intracranial abnormalities.  MRI brain negative for stroke.  MRA head and neck was also done.  Results deferred to neurology. Echocardiogram shows normal systolic function.  No significant valvular abnormalities. LDL is 87.  Patient on rosuvastatin currently. HbA1c 5.4. PT and OT evaluation. It looks like patient was on aspirin and Plavix till about a month ago when Plavix was discontinued.  He may not have been taking aspirin on a regular basis as well.  Currently on aspirin. Neurology feels that his symptoms are more likely secondary to orthostatic hypotension rather than a TIA.  Recommend only aspirin for now.   Orthostatic hypotension Recently started on Florinef for orthostatic hypotension by his neurologist at [facility].  He was taken off of midodrine.  Elevated blood pressure noted.  We may need to tolerate high blood pressure and have him to avoid significant drop in his blood pressures due to orthostasis.  Continue with TED stockings.  Did not do well when he was mobilized out of bed.  Had to sit back on the bed after just a few minutes.  Patient's daughter would like for him to at least be able to walk to the bathroom and back without much assistance before she can take him back home. He was also able to ambulate with the nurse.  Waiting on PT evaluation prior to discharge. Blood pressure is however noted to be elevated.  Will need to tolerate higher supine blood pressures in this patient.  Can try changing Florinef to every other day.  Discussed with daughter and she is agreeable to the same.  They will also follow-up with patient's neurologist.   Parkinson's disease Continue Sinemet.    Patient is stable.  Okay for discharge later today if he does well with physical therapy.   PERTINENT LABS:  The results of significant diagnostics from this hospitalization (including imaging,  microbiology, ancillary and laboratory) are listed below for reference.    Labs:   Basic Metabolic Panel: Recent Labs  Lab 12/29/22 1629 12/29/22 1649 12/30/22 0323  NA 139 139 138  K 4.1 4.2 3.5  CL 102 101 101  CO2 28  --  23  GLUCOSE 137* 135* 96  BUN 24* 26* 21  CREATININE 1.11 1.10 0.90  CALCIUM 9.5  --  9.3   Liver Function Tests: Recent Labs  Lab 12/29/22 1629  AST 19  ALT <5  ALKPHOS 62  BILITOT 0.6  PROT 6.6  ALBUMIN 3.7    CBC: Recent Labs  Lab 12/29/22 1629 12/29/22 1649 12/30/22 0323  WBC 8.0  --  8.6  NEUTROABS 6.0  --   --   HGB 13.1 12.6* 13.5  HCT 38.6* 37.0* 38.9*  MCV 92.8  --  90.7  PLT 166  --  159     CBG: Recent Labs  Lab 12/29/22 1652  GLUCAP 122*     IMAGING STUDIES EEG adult  Result Date: 12/30/2022 Jefferson Fuel, MD     12/30/2022  2:39 PM Routine EEG Report MIRIAM BIONDO is a 82 y.o. male with a history of altered mental status  who is undergoing an EEG to evaluate for seizures. Report: This EEG was acquired with electrodes placed according to the International 10-20 electrode system (including Fp1, Fp2, F3, F4, C3, C4, P3, P4, O1, O2, T3, T4, T5, T6, A1, A2, Fz, Cz, Pz). The following electrodes were missing or displaced: none. The occipital dominant rhythm was 7 Hz. This activity is reactive to stimulation. Drowsiness was manifested by background fragmentation; deeper stages of sleep were identified by K complexes and sleep spindles. There was no focal slowing. There were no interictal epileptiform discharges. There were no electrographic seizures identified. There was no abnormal response to photic stimulation or hyperventilation. Impression and clinical correlation: This EEG was obtained while awake and asleep and is abnormal due to mild diffuse slowing indicative of global cerebral dysfunction. Epileptiform abnormalities were not seen during this recording. Bing Neighbors, MD [facility] [phone] If 7pm- 7am,  please page neurology on call as listed in AMION.   ECHOCARDIOGRAM COMPLETE  Result Date: 12/30/2022    ECHOCARDIOGRAM REPORT   Patient Name:   Seth Carter Brophy Date of Exam: 12/30/2022 Medical Rec #:  [id number]    Height:       71.0 in Accession #:    [id number]   Weight:       185.0 lb Date of Birth:  July 27, 1940     BSA:          2.040 m Patient Age:    82 years     BP:           160/85 mmHg Patient Gender: M            HR:           69 bpm. Exam Location:  Inpatient Procedure: 2D Echo, Cardiac Doppler and Color Doppler Indications:    TIA G45.9  History:        Patient has prior history of Echocardiogram examinations, most  recent 01/20/2020. Risk Factors:Hypertension and Dyslipidemia.  Sonographer:    [name] Referring Phys: [id number] VISHAL R PATEL IMPRESSIONS  1. Left ventricular ejection fraction, by estimation, is 60 to 65%. The left ventricle has normal function. The left ventricle has no regional wall motion abnormalities. Left ventricular diastolic parameters are consistent with Grade I diastolic dysfunction (impaired relaxation).  2. Right ventricular systolic function is normal. The right ventricular size is normal.  3. Left atrial size was mildly dilated.  4. The mitral valve is normal in structure. No evidence of mitral valve regurgitation. No evidence of mitral stenosis.  5. The aortic valve is tricuspid. There is mild calcification of the aortic valve. There is mild thickening of the aortic valve. Aortic valve regurgitation is not visualized. No aortic stenosis is present.  6. Aortic dilatation noted. There is mild dilatation of the ascending aorta, measuring 38 mm. FINDINGS  Left Ventricle: Left ventricular ejection fraction, by estimation, is 60 to 65%. The left ventricle has normal function. The left ventricle has no regional wall motion abnormalities. The left ventricular internal cavity size was normal in size. There is  no left ventricular hypertrophy. Left ventricular  diastolic parameters are consistent with Grade I diastolic dysfunction (impaired relaxation). Normal left ventricular filling pressure. Right Ventricle: The right ventricular size is normal. Right vetricular wall thickness was not well visualized. Right ventricular systolic function is normal. Left Atrium: Left atrial size was mildly dilated. Right Atrium: Right atrial size was normal in size. Pericardium: There is no evidence of pericardial effusion. Mitral Valve: The mitral valve is normal in structure. No evidence of mitral valve regurgitation. No evidence of mitral valve stenosis. Tricuspid Valve: The tricuspid valve is normal in structure. Tricuspid valve regurgitation is not demonstrated. No evidence of tricuspid stenosis. Aortic Valve: The aortic valve is tricuspid. There is mild calcification of the aortic valve. There is mild thickening of the aortic valve. There is mild aortic valve annular calcification. Aortic valve regurgitation is not visualized. No aortic stenosis  is present. Aortic valve mean gradient measures 4.6 mmHg. Aortic valve peak gradient measures 9.2 mmHg. Aortic valve area, by VTI measures 2.45 cm. Pulmonic Valve: The pulmonic valve was not well visualized. Pulmonic valve regurgitation is not visualized. No evidence of pulmonic stenosis. Aorta: The aortic root is normal in size and structure and aortic dilatation noted. There is mild dilatation of the ascending aorta, measuring 38 mm. Venous: The inferior vena cava was not well visualized. IAS/Shunts: No atrial level shunt detected by color flow Doppler.  LEFT VENTRICLE PLAX 2D LVIDd:         4.40 cm   Diastology LVIDs:         3.20 cm   LV e' medial:    4.20 cm/s LV PW:         1.00 cm   LV E/e' medial:  12.5 LV IVS:        1.00 cm   LV e' lateral:   9.68 cm/s LVOT diam:     2.20 cm   LV E/e' lateral: 5.4 LV SV:         78 LV SV Index:   38 LVOT Area:     3.80 cm  RIGHT VENTRICLE             IVC RV S prime:     16.60 cm/s  IVC diam:  1.90 cm TAPSE (M-mode): 3.1 cm LEFT ATRIUM  Index        RIGHT ATRIUM           Index LA diam:        3.10 cm 1.52 cm/m   RA Area:     17.00 cm LA Vol (A2C):   60.6 ml 29.71 ml/m  RA Volume:   48.30 ml  23.68 ml/m LA Vol (A4C):   70.0 ml 34.32 ml/m LA Biplane Vol: 75.1 ml 36.82 ml/m  AORTIC VALVE                    PULMONIC VALVE AV Area (Vmax):    2.63 cm     PR End Diast Vel: 2.57 msec AV Area (Vmean):   2.58 cm AV Area (VTI):     2.45 cm AV Vmax:           152.05 cm/s AV Vmean:          98.622 cm/s AV VTI:            0.316 m AV Peak Grad:      9.2 mmHg AV Mean Grad:      4.6 mmHg LVOT Vmax:         105.00 cm/s LVOT Vmean:        66.900 cm/s LVOT VTI:          0.204 m LVOT/AV VTI ratio: 0.65  AORTA Ao Root diam: 3.30 cm Ao Asc diam:  3.80 cm MITRAL VALVE                TRICUSPID VALVE MV Area (PHT): 2.91 cm     TR Peak grad:   5.4 mmHg MV Decel Time: 261 msec     TR Vmax:        116.00 cm/s MV E velocity: 52.40 cm/s MV A velocity: 106.00 cm/s  SHUNTS MV E/A ratio:  0.49         Systemic VTI:  0.20 m                             Systemic Diam: 2.20 cm Dina Rich MD Electronically signed by Dina Rich MD Signature Date/Time: 12/30/2022/12:38:59 PM    Final    MR ANGIO HEAD WO CONTRAST  Result Date: 12/30/2022 CLINICAL DATA:  Initial evaluation for TIA, mental status change. EXAM: MRI HEAD WITHOUT CONTRAST MRA HEAD WITHOUT CONTRAST MRA NECK WITHOUT AND WITH CONTRAST TECHNIQUE: Multiplanar, multi-echo pulse sequences of the brain and surrounding structures were acquired without intravenous contrast. Angiographic images of the Circle of Willis were acquired using MRA technique without intravenous contrast. Angiographic images of the neck were acquired using MRA technique without and with intravenous contrast. Carotid stenosis measurements (when applicable) are obtained utilizing NASCET criteria, using the distal internal carotid diameter as the denominator. CONTRAST:  9mL GADAVIST  GADOBUTROL 1 MMOL/ML IV SOLN COMPARISON:  Prior CT from 12/29/2022. FINDINGS: MRI HEAD FINDINGS Brain: Mild age-related cerebral atrophy. Patchy T2/FLAIR hyperintensity involving the periventricular and deep white matter both cerebral hemispheres as well as the pons, consistent with chronic small vessel ischemic disease, mild for age. Few small remote left cerebellar infarcts noted. No evidence for acute or subacute ischemia. Gray-white matter differentiation maintained. No other areas of chronic cortical infarction. No acute intracranial hemorrhage. Subtle siderosis noted about the cerebellar vermis, suggesting prior subarachnoid hemorrhage. Few additional punctate chronic micro hemorrhages noted within the right cerebellum and right temporal lobe. No mass lesion, midline  shift or mass effect no hydrocephalus or extra-axial fluid collection. Pituitary gland and suprasellar region within normal limits. Vascular: Loss of normal flow void within the left vertebral artery, consistent with slow flow and/or occlusion. Major intracranial vascular flow voids are otherwise maintained. Skull and upper cervical spine: Craniocervical junction within normal limits. Bone marrow signal intensity normal. No scalp soft tissue abnormality. Sinuses/Orbits: Prior bilateral ocular lens replacement. Scattered mucosal thickening noted about the ethmoidal air cells. Paranasal sinuses are otherwise largely clear. Moderate left mastoid effusion, of uncertain significance. Other: None. MRA HEAD FINDINGS Anterior circulation: Both internal carotid arteries are patent through the siphons without significant stenosis or other abnormality. A1 segments patent bilaterally. Normal anterior communicating complex. Anterior cerebral arteries patent without stenosis. No significant M1 stenosis. Left M1 bifurcates early. Focal moderate proximal left M2 stenosis, superior division (series 10 20, image 1). Focal severe proximal right M2 stenosis, inferior  division (series 10 20, image 1). MCA branches are otherwise patent and well perfused, although demonstrates small vessel atheromatous irregularity. Posterior circulation: Right vertebral artery dominant and widely patent as it courses into the cranial vault. Mild stenosis noted just prior to the vertebrobasilar junction. Left V4 segment is hypoplastic and not well seen on this portion of the exam. On corresponding MRA of the neck, the left vertebral artery appears to be patent to the skull base. Attenuated flow within the proximal left V4 segment with some perfusion of the left PICA. Left V4 segment occluded distally. Neither PICA is well visualized. Basilar mildly irregular but patent without significant stenosis. Superior cerebral arteries patent bilaterally. Both PCAs primarily supplied via the basilar. Short-segment moderate right and severe left proximal P2 stenoses (series 1010, image 6). PCAs are irregular but remain patent to their distal aspects. Anatomic variants: As above.  No aneurysm. MRA NECK FINDINGS Aortic arch: Visualized aortic arch normal caliber with standard branch pattern. No stenosis about the origin the great vessels. Right carotid system: Right common and internal carotid arteries are patent without evidence for dissection. Minor for age atheromatous irregularity about the right carotid bulb without hemodynamically significant stenosis. Left carotid system: Left common and internal carotid arteries are patent without evidence for dissection. Mild for age atheromatous irregularity about the left carotid bulb without hemodynamically significant stenosis. Vertebral arteries: Both vertebral arteries arise from subclavian arteries. The proximal left subclavian artery is incompletely visualized on this exam. Vertebral arteries M cells are patent within the neck without stenosis or evidence for dissection. Other: None IMPRESSION: MRI HEAD IMPRESSION: 1. No acute intracranial infarct or other  abnormality. 2. Mild age-related cerebral atrophy with chronic small vessel ischemic disease, with a few small remote left cerebellar infarcts. 3. Subtle siderosis about the cerebellar vermis, suggesting prior subarachnoid hemorrhage. MRA HEAD IMPRESSION: 1. Negative intracranial MRA for acute large vessel occlusion. 2. Occlusion of the intradural left V4 segment beyond the takeoff of the left PICA, presumably chronic in nature given the lack of acute ischemia. 3. Intracranial atherosclerotic disease with associated moderate to severe bilateral M2 and P2 stenoses as above. MRA NECK IMPRESSION: Wide patency of both carotid artery systems and vertebral arteries within the neck. No hemodynamically significant stenosis or other acute vascular abnormality. Electronically Signed   By: Rise Mu M.D.   On: 12/30/2022 01:35   MR BRAIN WO CONTRAST  Result Date: 12/30/2022 CLINICAL DATA:  Initial evaluation for TIA, mental status change. EXAM: MRI HEAD WITHOUT CONTRAST MRA HEAD WITHOUT CONTRAST MRA NECK WITHOUT AND WITH CONTRAST TECHNIQUE: Multiplanar, multi-echo pulse  sequences of the brain and surrounding structures were acquired without intravenous contrast. Angiographic images of the Circle of Willis were acquired using MRA technique without intravenous contrast. Angiographic images of the neck were acquired using MRA technique without and with intravenous contrast. Carotid stenosis measurements (when applicable) are obtained utilizing NASCET criteria, using the distal internal carotid diameter as the denominator. CONTRAST:  9mL GADAVIST GADOBUTROL 1 MMOL/ML IV SOLN COMPARISON:  Prior CT from 12/29/2022. FINDINGS: MRI HEAD FINDINGS Brain: Mild age-related cerebral atrophy. Patchy T2/FLAIR hyperintensity involving the periventricular and deep white matter both cerebral hemispheres as well as the pons, consistent with chronic small vessel ischemic disease, mild for age. Few small remote left cerebellar  infarcts noted. No evidence for acute or subacute ischemia. Gray-white matter differentiation maintained. No other areas of chronic cortical infarction. No acute intracranial hemorrhage. Subtle siderosis noted about the cerebellar vermis, suggesting prior subarachnoid hemorrhage. Few additional punctate chronic micro hemorrhages noted within the right cerebellum and right temporal lobe. No mass lesion, midline shift or mass effect no hydrocephalus or extra-axial fluid collection. Pituitary gland and suprasellar region within normal limits. Vascular: Loss of normal flow void within the left vertebral artery, consistent with slow flow and/or occlusion. Major intracranial vascular flow voids are otherwise maintained. Skull and upper cervical spine: Craniocervical junction within normal limits. Bone marrow signal intensity normal. No scalp soft tissue abnormality. Sinuses/Orbits: Prior bilateral ocular lens replacement. Scattered mucosal thickening noted about the ethmoidal air cells. Paranasal sinuses are otherwise largely clear. Moderate left mastoid effusion, of uncertain significance. Other: None. MRA HEAD FINDINGS Anterior circulation: Both internal carotid arteries are patent through the siphons without significant stenosis or other abnormality. A1 segments patent bilaterally. Normal anterior communicating complex. Anterior cerebral arteries patent without stenosis. No significant M1 stenosis. Left M1 bifurcates early. Focal moderate proximal left M2 stenosis, superior division (series 10 20, image 1). Focal severe proximal right M2 stenosis, inferior division (series 10 20, image 1). MCA branches are otherwise patent and well perfused, although demonstrates small vessel atheromatous irregularity. Posterior circulation: Right vertebral artery dominant and widely patent as it courses into the cranial vault. Mild stenosis noted just prior to the vertebrobasilar junction. Left V4 segment is hypoplastic and not well  seen on this portion of the exam. On corresponding MRA of the neck, the left vertebral artery appears to be patent to the skull base. Attenuated flow within the proximal left V4 segment with some perfusion of the left PICA. Left V4 segment occluded distally. Neither PICA is well visualized. Basilar mildly irregular but patent without significant stenosis. Superior cerebral arteries patent bilaterally. Both PCAs primarily supplied via the basilar. Short-segment moderate right and severe left proximal P2 stenoses (series 1010, image 6). PCAs are irregular but remain patent to their distal aspects. Anatomic variants: As above.  No aneurysm. MRA NECK FINDINGS Aortic arch: Visualized aortic arch normal caliber with standard branch pattern. No stenosis about the origin the great vessels. Right carotid system: Right common and internal carotid arteries are patent without evidence for dissection. Minor for age atheromatous irregularity about the right carotid bulb without hemodynamically significant stenosis. Left carotid system: Left common and internal carotid arteries are patent without evidence for dissection. Mild for age atheromatous irregularity about the left carotid bulb without hemodynamically significant stenosis. Vertebral arteries: Both vertebral arteries arise from subclavian arteries. The proximal left subclavian artery is incompletely visualized on this exam. Vertebral arteries M cells are patent within the neck without stenosis or evidence for dissection. Other: None IMPRESSION:  MRI HEAD IMPRESSION: 1. No acute intracranial infarct or other abnormality. 2. Mild age-related cerebral atrophy with chronic small vessel ischemic disease, with a few small remote left cerebellar infarcts. 3. Subtle siderosis about the cerebellar vermis, suggesting prior subarachnoid hemorrhage. MRA HEAD IMPRESSION: 1. Negative intracranial MRA for acute large vessel occlusion. 2. Occlusion of the intradural left V4 segment beyond  the takeoff of the left PICA, presumably chronic in nature given the lack of acute ischemia. 3. Intracranial atherosclerotic disease with associated moderate to severe bilateral M2 and P2 stenoses as above. MRA NECK IMPRESSION: Wide patency of both carotid artery systems and vertebral arteries within the neck. No hemodynamically significant stenosis or other acute vascular abnormality. Electronically Signed   By: Rise Mu M.D.   On: 12/30/2022 01:35   MR Angiogram Neck W or Wo Contrast  Result Date: 12/30/2022 CLINICAL DATA:  Initial evaluation for TIA, mental status change. EXAM: MRI HEAD WITHOUT CONTRAST MRA HEAD WITHOUT CONTRAST MRA NECK WITHOUT AND WITH CONTRAST TECHNIQUE: Multiplanar, multi-echo pulse sequences of the brain and surrounding structures were acquired without intravenous contrast. Angiographic images of the Circle of Willis were acquired using MRA technique without intravenous contrast. Angiographic images of the neck were acquired using MRA technique without and with intravenous contrast. Carotid stenosis measurements (when applicable) are obtained utilizing NASCET criteria, using the distal internal carotid diameter as the denominator. CONTRAST:  9mL GADAVIST GADOBUTROL 1 MMOL/ML IV SOLN COMPARISON:  Prior CT from 12/29/2022. FINDINGS: MRI HEAD FINDINGS Brain: Mild age-related cerebral atrophy. Patchy T2/FLAIR hyperintensity involving the periventricular and deep white matter both cerebral hemispheres as well as the pons, consistent with chronic small vessel ischemic disease, mild for age. Few small remote left cerebellar infarcts noted. No evidence for acute or subacute ischemia. Gray-white matter differentiation maintained. No other areas of chronic cortical infarction. No acute intracranial hemorrhage. Subtle siderosis noted about the cerebellar vermis, suggesting prior subarachnoid hemorrhage. Few additional punctate chronic micro hemorrhages noted within the right cerebellum  and right temporal lobe. No mass lesion, midline shift or mass effect no hydrocephalus or extra-axial fluid collection. Pituitary gland and suprasellar region within normal limits. Vascular: Loss of normal flow void within the left vertebral artery, consistent with slow flow and/or occlusion. Major intracranial vascular flow voids are otherwise maintained. Skull and upper cervical spine: Craniocervical junction within normal limits. Bone marrow signal intensity normal. No scalp soft tissue abnormality. Sinuses/Orbits: Prior bilateral ocular lens replacement. Scattered mucosal thickening noted about the ethmoidal air cells. Paranasal sinuses are otherwise largely clear. Moderate left mastoid effusion, of uncertain significance. Other: None. MRA HEAD FINDINGS Anterior circulation: Both internal carotid arteries are patent through the siphons without significant stenosis or other abnormality. A1 segments patent bilaterally. Normal anterior communicating complex. Anterior cerebral arteries patent without stenosis. No significant M1 stenosis. Left M1 bifurcates early. Focal moderate proximal left M2 stenosis, superior division (series 10 20, image 1). Focal severe proximal right M2 stenosis, inferior division (series 10 20, image 1). MCA branches are otherwise patent and well perfused, although demonstrates small vessel atheromatous irregularity. Posterior circulation: Right vertebral artery dominant and widely patent as it courses into the cranial vault. Mild stenosis noted just prior to the vertebrobasilar junction. Left V4 segment is hypoplastic and not well seen on this portion of the exam. On corresponding MRA of the neck, the left vertebral artery appears to be patent to the skull base. Attenuated flow within the proximal left V4 segment with some perfusion of the left PICA. Left V4 segment  occluded distally. Neither PICA is well visualized. Basilar mildly irregular but patent without significant stenosis. Superior  cerebral arteries patent bilaterally. Both PCAs primarily supplied via the basilar. Short-segment moderate right and severe left proximal P2 stenoses (series 1010, image 6). PCAs are irregular but remain patent to their distal aspects. Anatomic variants: As above.  No aneurysm. MRA NECK FINDINGS Aortic arch: Visualized aortic arch normal caliber with standard branch pattern. No stenosis about the origin the great vessels. Right carotid system: Right common and internal carotid arteries are patent without evidence for dissection. Minor for age atheromatous irregularity about the right carotid bulb without hemodynamically significant stenosis. Left carotid system: Left common and internal carotid arteries are patent without evidence for dissection. Mild for age atheromatous irregularity about the left carotid bulb without hemodynamically significant stenosis. Vertebral arteries: Both vertebral arteries arise from subclavian arteries. The proximal left subclavian artery is incompletely visualized on this exam. Vertebral arteries M cells are patent within the neck without stenosis or evidence for dissection. Other: None IMPRESSION: MRI HEAD IMPRESSION: 1. No acute intracranial infarct or other abnormality. 2. Mild age-related cerebral atrophy with chronic small vessel ischemic disease, with a few small remote left cerebellar infarcts. 3. Subtle siderosis about the cerebellar vermis, suggesting prior subarachnoid hemorrhage. MRA HEAD IMPRESSION: 1. Negative intracranial MRA for acute large vessel occlusion. 2. Occlusion of the intradural left V4 segment beyond the takeoff of the left PICA, presumably chronic in nature given the lack of acute ischemia. 3. Intracranial atherosclerotic disease with associated moderate to severe bilateral M2 and P2 stenoses as above. MRA NECK IMPRESSION: Wide patency of both carotid artery systems and vertebral arteries within the neck. No hemodynamically significant stenosis or other acute  vascular abnormality. Electronically Signed   By: Rise Mu M.D.   On: 12/30/2022 01:35   CT HEAD WO CONTRAST  Result Date: 12/29/2022 CLINICAL DATA:  Transient ischemic attack. EXAM: CT HEAD WITHOUT CONTRAST TECHNIQUE: Contiguous axial images were obtained from the base of the skull through the vertex without intravenous contrast. RADIATION DOSE REDUCTION: This exam was performed according to the departmental dose-optimization program which includes automated exposure control, adjustment of the mA and/or kV according to patient size and/or use of iterative reconstruction technique. COMPARISON:  09/29/2020. FINDINGS: Brain: There is no evidence for acute hemorrhage, hydrocephalus, mass lesion, or abnormal extra-axial fluid collection. No definite CT evidence for acute infarction. Diffuse loss of parenchymal volume is consistent with atrophy. Patchy low attenuation in the deep hemispheric and periventricular white matter is nonspecific, but likely reflects chronic microvascular ischemic demyelination. Chronic lacunar infarcts are seen in the basal ganglia bilaterally. Vascular: No hyperdense vessel or unexpected calcification. Skull: No evidence for fracture. No worrisome lytic or sclerotic lesion. Sinuses/Orbits: Left mastoid effusion is new in the interval. Right mastoid air cells are clear. Paranasal sinuses are clear. Visualized portions of the globes and intraorbital fat are unremarkable. Other: None. IMPRESSION: 1. Left mastoid effusion.  New since prior study. 2. No acute intracranial abnormality. 3. Atrophy with chronic small vessel white matter ischemic disease. Electronically Signed   By: Kennith Center M.D.   On: 12/29/2022 18:12    DISCHARGE EXAMINATION: Vitals:   01/01/23 0331 01/01/23 0746 01/01/23 1123 01/01/23 1132  BP: (!) 178/87 (!) 198/84 102/64 (!) 193/83  Pulse: 61 (!) 53 (!) 56 (!) 54  Resp: 18 18    Temp: 98.5 F (36.9 C) 97.8 F (36.6 C)  98.4 F (36.9 C)  TempSrc:  Oral Oral  Oral  SpO2: 98% 98%  98%  Weight:      Height:       General appearance: Awake alert.  In no distress Resp: Clear to auscultation bilaterally.  Normal effort Cardio: S1-S2 is normal regular.  No S3-S4.  No rubs murmurs or bruit GI: Abdomen is soft.  Nontender nondistended.  Bowel sounds are present normal.  No masses organomegaly   DISPOSITION: Home  Discharge Instructions     Call MD for:  difficulty breathing, headache or visual disturbances   Complete by: As directed    Call MD for:  extreme fatigue   Complete by: As directed    Call MD for:  persistant dizziness or light-headedness   Complete by: As directed    Call MD for:  persistant nausea and vomiting   Complete by: As directed    Call MD for:  severe uncontrolled pain   Complete by: As directed    Call MD for:  temperature >100.4   Complete by: As directed    Diet - low sodium heart healthy   Complete by: As directed    Discharge instructions   Complete by: As directed    Please be sure to follow-up with your neurologist at [facility].  Take your medications as prescribed.  Please get up slowly from a lying and sitting position to avoid excessive drop in blood pressure.  Do not start moving immediately.  Give your body time to adjust to the change in position.  You were cared for by a hospitalist during your hospital stay. If you have any questions about your discharge medications or the care you received while you were in the hospital after you are discharged, you can call the unit and asked to speak with the hospitalist on call if the hospitalist that took care of you is not available. Once you are discharged, your primary care physician will handle any further medical issues. Please note that NO REFILLS for any discharge medications will be authorized once you are discharged, as it is imperative that you return to your primary care physician (or establish a relationship with a primary care physician if you do not  have one) for your aftercare needs so that they can reassess your need for medications and monitor your lab values. If you do not have a primary care physician, you can call [phone] for a physician referral.   Increase activity slowly   Complete by: As directed           Allergies as of 01/01/2023       Reactions   Cialis [tadalafil] Other (See Comments)   Severe back pain   Pneumovax [pneumococcal Polysaccharide Vaccine] Swelling, Other (See Comments)   Arm swelling and pain   Zocor [simvastatin] Other (See Comments)   Nightmares   Ace Inhibitors Cough   Contrast Media [iodinated Contrast Media] Itching, Rash, Other (See Comments)   Possible delayed reaction = all-over body rash and seizures that began after this was used        Medication List     STOP taking these medications    midodrine 5 MG tablet Commonly known as: PROAMATINE       TAKE these medications    aspirin EC 81 MG tablet Take 81 mg by mouth at bedtime.   carbidopa-levodopa 25-100 MG tablet Commonly known as: SINEMET IR Take 2 tablets by mouth 3 (three) times daily.   cetirizine 10 MG tablet Commonly known as: ZYRTEC Take 10 mg by mouth daily.  dorzolamide-timolol 2-0.5 % ophthalmic solution Commonly known as: COSOPT Place 1 drop into both eyes 2 (two) times daily.   fludrocortisone 0.1 MG tablet Commonly known as: FLORINEF Take 1 tablet (0.1 mg total) by mouth every other day.   latanoprost 0.005 % ophthalmic solution Commonly known as: XALATAN Place 1 drop into both eyes at bedtime.   MAGNESIUM OXIDE PO Take 1 tablet by mouth at bedtime.   multivitamin with minerals Tabs tablet Take 1 tablet by mouth daily.   PreserVision/Lutein Caps Take 1 capsule by mouth daily with lunch.   rosuvastatin 10 MG tablet Commonly known as: CRESTOR Take 10 mg by mouth at bedtime.   VITAMIN B-12 PO Take 1 tablet by mouth daily with lunch.          Follow-up Information     Corie Chiquito, MD. Go on 02/02/2023.   Specialty: Neurology Contact information: [address] [place] [address] [phone]         [facility] Follow up.   Why: [phone] The home health agency will contact you for the first home visit                TOTAL DISCHARGE TIME: 35 minutes  Djibril Glogowski [employer] on [email or web address]  01/01/2023, 1:05 PM

## 2023-01-01 NOTE — TOC Transition Note (Signed)
Transition of Care [facility]) - CM/SW Discharge Note   Patient Details  Name: Seth Carter MRN: [id number] Date of Birth: 1941-03-08  Transition of Care [facility]) CM/SW Contact:  Kermit Balo, RN Phone Number: 01/01/2023, 12:54 PM   Clinical Narrative:    Pt is discharging home with home health through [facility]. Information on the AVS.  No DME needs.  Pt has transportation home.   Final next level of care: Home w Home Health Services Barriers to Discharge: No Barriers Identified   Patient Goals and CMS Choice      Discharge Placement                         Discharge Plan and Services Additional resources added to the After Visit Summary for     Discharge Planning Services: CM Consult                      HH Arranged: RN, PT, OT [facility] Agency: [facility] Date [facility] Agency Contacted: 12/31/22   Representative spoke with at [facility] Agency: Barbara Cower  Social Determinants of Health (SDOH) Interventions SDOH Screenings   Food Insecurity: No Food Insecurity (12/30/2022)  Housing: Low Risk  (12/30/2022)  Transportation Needs: No Transportation Needs (12/30/2022)  Utilities: Not At Risk (12/30/2022)  Depression (PHQ2-9): Low Risk  (12/18/2019)  Tobacco Use: Low Risk  (11/28/2022)     Readmission Risk Interventions     No data to display

## 2023-01-01 NOTE — Plan of Care (Signed)

## 2023-01-02 DIAGNOSIS — G20A1 Parkinson's disease without dyskinesia, without mention of fluctuations: Secondary | ICD-10-CM | POA: Diagnosis not present

## 2023-01-02 DIAGNOSIS — I951 Orthostatic hypotension: Secondary | ICD-10-CM | POA: Diagnosis not present

## 2023-01-02 DIAGNOSIS — H409 Unspecified glaucoma: Secondary | ICD-10-CM | POA: Diagnosis not present

## 2023-01-02 DIAGNOSIS — Z8673 Personal history of transient ischemic attack (TIA), and cerebral infarction without residual deficits: Secondary | ICD-10-CM | POA: Diagnosis not present

## 2023-01-02 DIAGNOSIS — Z9181 History of falling: Secondary | ICD-10-CM | POA: Diagnosis not present

## 2023-01-02 DIAGNOSIS — Z556 Problems related to health literacy: Secondary | ICD-10-CM | POA: Diagnosis not present

## 2023-01-02 DIAGNOSIS — F0670 Mild neurocognitive disorder due to known physiological condition without behavioral disturbance: Secondary | ICD-10-CM | POA: Diagnosis not present

## 2023-01-02 DIAGNOSIS — F0671 Mild neurocognitive disorder due to known physiological condition with behavioral disturbance: Secondary | ICD-10-CM | POA: Diagnosis not present

## 2023-01-02 DIAGNOSIS — E538 Deficiency of other specified B group vitamins: Secondary | ICD-10-CM | POA: Diagnosis not present

## 2023-01-02 DIAGNOSIS — M48061 Spinal stenosis, lumbar region without neurogenic claudication: Secondary | ICD-10-CM | POA: Diagnosis not present

## 2023-01-02 DIAGNOSIS — Z7982 Long term (current) use of aspirin: Secondary | ICD-10-CM | POA: Diagnosis not present

## 2023-01-02 DIAGNOSIS — N401 Enlarged prostate with lower urinary tract symptoms: Secondary | ICD-10-CM | POA: Diagnosis not present

## 2023-01-06 DIAGNOSIS — I951 Orthostatic hypotension: Secondary | ICD-10-CM | POA: Diagnosis not present

## 2023-01-06 DIAGNOSIS — E538 Deficiency of other specified B group vitamins: Secondary | ICD-10-CM | POA: Diagnosis not present

## 2023-01-06 DIAGNOSIS — N401 Enlarged prostate with lower urinary tract symptoms: Secondary | ICD-10-CM | POA: Diagnosis not present

## 2023-01-06 DIAGNOSIS — H409 Unspecified glaucoma: Secondary | ICD-10-CM | POA: Diagnosis not present

## 2023-01-06 DIAGNOSIS — G20A1 Parkinson's disease without dyskinesia, without mention of fluctuations: Secondary | ICD-10-CM | POA: Diagnosis not present

## 2023-01-06 DIAGNOSIS — F0670 Mild neurocognitive disorder due to known physiological condition without behavioral disturbance: Secondary | ICD-10-CM | POA: Diagnosis not present

## 2023-01-09 DIAGNOSIS — G20A1 Parkinson's disease without dyskinesia, without mention of fluctuations: Secondary | ICD-10-CM | POA: Diagnosis not present

## 2023-01-09 DIAGNOSIS — N401 Enlarged prostate with lower urinary tract symptoms: Secondary | ICD-10-CM | POA: Diagnosis not present

## 2023-01-09 DIAGNOSIS — I951 Orthostatic hypotension: Secondary | ICD-10-CM | POA: Diagnosis not present

## 2023-01-09 DIAGNOSIS — F0670 Mild neurocognitive disorder due to known physiological condition without behavioral disturbance: Secondary | ICD-10-CM | POA: Diagnosis not present

## 2023-01-09 DIAGNOSIS — H409 Unspecified glaucoma: Secondary | ICD-10-CM | POA: Diagnosis not present

## 2023-01-09 DIAGNOSIS — E538 Deficiency of other specified B group vitamins: Secondary | ICD-10-CM | POA: Diagnosis not present

## 2023-01-10 DIAGNOSIS — N401 Enlarged prostate with lower urinary tract symptoms: Secondary | ICD-10-CM | POA: Diagnosis not present

## 2023-01-10 DIAGNOSIS — F0670 Mild neurocognitive disorder due to known physiological condition without behavioral disturbance: Secondary | ICD-10-CM | POA: Diagnosis not present

## 2023-01-10 DIAGNOSIS — E538 Deficiency of other specified B group vitamins: Secondary | ICD-10-CM | POA: Diagnosis not present

## 2023-01-10 DIAGNOSIS — I951 Orthostatic hypotension: Secondary | ICD-10-CM | POA: Diagnosis not present

## 2023-01-10 DIAGNOSIS — G20A1 Parkinson's disease without dyskinesia, without mention of fluctuations: Secondary | ICD-10-CM | POA: Diagnosis not present

## 2023-01-10 DIAGNOSIS — H409 Unspecified glaucoma: Secondary | ICD-10-CM | POA: Diagnosis not present

## 2023-01-11 ENCOUNTER — Emergency Department (HOSPITAL_COMMUNITY): Payer: Medicare Other

## 2023-01-11 ENCOUNTER — Emergency Department (HOSPITAL_COMMUNITY)
Admission: EM | Admit: 2023-01-11 | Discharge: 2023-01-11 | Disposition: A | Discharge status: Home / Self Care | Payer: Medicare Other | Attending: Emergency Medicine | Admitting: Emergency Medicine

## 2023-01-11 DIAGNOSIS — G20A1 Parkinson's disease without dyskinesia, without mention of fluctuations: Secondary | ICD-10-CM | POA: Diagnosis not present

## 2023-01-11 DIAGNOSIS — R410 Disorientation, unspecified: Secondary | ICD-10-CM

## 2023-01-11 DIAGNOSIS — E538 Deficiency of other specified B group vitamins: Secondary | ICD-10-CM | POA: Diagnosis not present

## 2023-01-11 DIAGNOSIS — I10 Essential (primary) hypertension: Secondary | ICD-10-CM | POA: Diagnosis not present

## 2023-01-11 DIAGNOSIS — R29818 Other symptoms and signs involving the nervous system: Secondary | ICD-10-CM | POA: Diagnosis not present

## 2023-01-11 DIAGNOSIS — Z79899 Other long term (current) drug therapy: Secondary | ICD-10-CM | POA: Diagnosis not present

## 2023-01-11 DIAGNOSIS — R9431 Abnormal electrocardiogram [ECG] [EKG]: Secondary | ICD-10-CM | POA: Diagnosis not present

## 2023-01-11 DIAGNOSIS — F0670 Mild neurocognitive disorder due to known physiological condition without behavioral disturbance: Secondary | ICD-10-CM | POA: Diagnosis not present

## 2023-01-11 DIAGNOSIS — H409 Unspecified glaucoma: Secondary | ICD-10-CM | POA: Diagnosis not present

## 2023-01-11 DIAGNOSIS — R479 Unspecified speech disturbances: Secondary | ICD-10-CM | POA: Diagnosis present

## 2023-01-11 DIAGNOSIS — G20A2 Parkinson's disease without dyskinesia, with fluctuations: Secondary | ICD-10-CM | POA: Insufficient documentation

## 2023-01-11 DIAGNOSIS — Z7982 Long term (current) use of aspirin: Secondary | ICD-10-CM | POA: Diagnosis not present

## 2023-01-11 DIAGNOSIS — G459 Transient cerebral ischemic attack, unspecified: Secondary | ICD-10-CM | POA: Diagnosis not present

## 2023-01-11 DIAGNOSIS — N401 Enlarged prostate with lower urinary tract symptoms: Secondary | ICD-10-CM | POA: Diagnosis not present

## 2023-01-11 DIAGNOSIS — R791 Abnormal coagulation profile: Secondary | ICD-10-CM | POA: Insufficient documentation

## 2023-01-11 DIAGNOSIS — R4701 Aphasia: Secondary | ICD-10-CM | POA: Diagnosis not present

## 2023-01-11 DIAGNOSIS — Z20822 Contact with and (suspected) exposure to covid-19: Secondary | ICD-10-CM | POA: Insufficient documentation

## 2023-01-11 DIAGNOSIS — R059 Cough, unspecified: Secondary | ICD-10-CM | POA: Diagnosis not present

## 2023-01-11 DIAGNOSIS — F0390 Unspecified dementia without behavioral disturbance: Secondary | ICD-10-CM | POA: Insufficient documentation

## 2023-01-11 DIAGNOSIS — I951 Orthostatic hypotension: Secondary | ICD-10-CM | POA: Diagnosis not present

## 2023-01-11 LAB — COMPREHENSIVE METABOLIC PANEL
ALT: 7 U/L (ref 0–44)
AST: 18 U/L (ref 15–41)
Albumin: 4.1 g/dL (ref 3.5–5.0)
Alkaline Phosphatase: 80 U/L (ref 38–126)
Anion gap: 15 (ref 5–15)
BUN: 27 mg/dL — ABNORMAL HIGH (ref 8–23)
CO2: 26 mmol/L (ref 22–32)
Calcium: 10.2 mg/dL (ref 8.9–10.3)
Chloride: 99 mmol/L (ref 98–111)
Creatinine, Ser: 1.16 mg/dL (ref 0.61–1.24)
GFR, Estimated: >60 mL/min (ref >60)
Glucose, Bld: 129 mg/dL — ABNORMAL HIGH (ref 70–99)
Potassium: 4.2 mmol/L (ref 3.5–5.1)
Sodium: 140 mmol/L (ref 135–145)
Total Bilirubin: 1 mg/dL (ref 0.3–1.2)
Total Protein: 6.8 g/dL (ref 6.5–8.1)

## 2023-01-11 LAB — URINALYSIS, ROUTINE W REFLEX MICROSCOPIC
Bilirubin Urine: NEGATIVE
Glucose, UA: NEGATIVE mg/dL
Hgb urine dipstick: NEGATIVE
Ketones, ur: 5 mg/dL — AB
Leukocytes,Ua: NEGATIVE
Nitrite: NEGATIVE
Protein, ur: NEGATIVE mg/dL
Specific Gravity, Urine: 1.021 (ref 1.005–1.030)
pH: 6 (ref 5.0–8.0)

## 2023-01-11 LAB — DIFFERENTIAL
Abs Immature Granulocytes: 0.04 10*3/uL (ref 0.00–0.07)
Basophils Absolute: 0.1 10*3/uL (ref 0.0–0.1)
Basophils Relative: 1 %
Eosinophils Absolute: 0.2 10*3/uL (ref 0.0–0.5)
Eosinophils Relative: 3 %
Immature Granulocytes: 0 %
Lymphocytes Relative: 18 %
Lymphs Abs: 1.7 10*3/uL (ref 0.7–4.0)
Monocytes Absolute: 0.7 10*3/uL (ref 0.1–1.0)
Monocytes Relative: 8 %
Neutro Abs: 6.6 10*3/uL (ref 1.7–7.7)
Neutrophils Relative %: 70 %

## 2023-01-11 LAB — CBC
HCT: 42.5 % (ref 39.0–52.0)
Hemoglobin: 14.2 g/dL (ref 13.0–17.0)
MCH: 31 pg (ref 26.0–34.0)
MCHC: 33.4 g/dL (ref 30.0–36.0)
MCV: 92.8 fL (ref 80.0–100.0)
Platelets: 182 10*3/uL (ref 150–400)
RBC: 4.58 MIL/uL (ref 4.22–5.81)
RDW: 12.8 % (ref 11.5–15.5)
WBC: 9.4 10*3/uL (ref 4.0–10.5)
nRBC: 0 % (ref 0.0–0.2)

## 2023-01-11 LAB — CBG MONITORING, ED
Glucose-Capillary: 121 mg/dL — ABNORMAL HIGH (ref 70–99)
Glucose-Capillary: 115 mg/dL — ABNORMAL HIGH (ref 70–99)

## 2023-01-11 LAB — PROTIME-INR
INR: 1.1 (ref 0.8–1.2)
Prothrombin Time: 14.5 seconds (ref 11.4–15.2)

## 2023-01-11 LAB — I-STAT CHEM 8, ED
BUN: 29 mg/dL — ABNORMAL HIGH (ref 8–23)
Calcium, Ion: 1.29 mmol/L (ref 1.15–1.40)
Chloride: 102 mmol/L (ref 98–111)
Creatinine, Ser: 1.1 mg/dL (ref 0.61–1.24)
Glucose, Bld: 122 mg/dL — ABNORMAL HIGH (ref 70–99)
HCT: 43 % (ref 39.0–52.0)
Hemoglobin: 14.6 g/dL (ref 13.0–17.0)
Potassium: 4.2 mmol/L (ref 3.5–5.1)
Sodium: 140 mmol/L (ref 135–145)
TCO2: 27 mmol/L (ref 22–32)

## 2023-01-11 LAB — RAPID URINE DRUG SCREEN, HOSP PERFORMED
Amphetamines: NOT DETECTED
Barbiturates: NOT DETECTED
Benzodiazepines: NOT DETECTED
Cocaine: NOT DETECTED
Opiates: NOT DETECTED
Tetrahydrocannabinol: NOT DETECTED

## 2023-01-11 LAB — SARS CORONAVIRUS 2 BY RT PCR: SARS Coronavirus 2 by RT PCR: NEGATIVE

## 2023-01-11 LAB — ETHANOL: Alcohol, Ethyl (B): <10 mg/dL (ref <10)

## 2023-01-11 LAB — APTT: aPTT: 31 seconds (ref 24–36)

## 2023-01-11 NOTE — ED Triage Notes (Signed)
Pt BIB EMS from home after episode of being in a "daze and staring into space". Per EMS pt's wife reports pt has 15 min episodes of previously stated but this time he also has expressive aphasia. HX of parkinson's & dementia. A&Ox1to self only (baseline).  EMS VS 160/84 58 HR 98% RA [id number]

## 2023-01-11 NOTE — Consult Note (Signed)
Neurology Consultation Reason for Consult: Code stroke Requesting Physician: Jacalyn Lefevre  CC: AMS   History is obtained from: EMS, family did not answer calls   HPI: Seth Carter is a 82 y.o. male with a past medical history significant for Parkinson's disease is with Parkinson's dementia, orthostatic hypotension, hyperlipidemia, recent admission for TIA workup  Reportedly he was last seen well at 4 PM by his family and had one of his typical Parkinson freezing episodes.  However these typically resolve after a few minutes and given the symptoms were not resolving with significant speech impairment, family activated EMS.  Patient was improving during transport with his ability to speak.  LKW: 4 MP Thrombolytic given?: No, MRI negative IA performed?: No, exam not consistent with LVO Premorbid modified rankin scale:      3 - Moderate disability. Requires some help, but able to walk unassisted.  ROS: Unable to obtain due to altered mental status.   Past Medical History:  Diagnosis Date   Acute pharyngitis    AKI (acute kidney injury) 09/29/2020   Allergic rhinitis    Benign prostatic hyperplasia with lower urinary tract symptoms 09/23/2020   Bradykinesia    Cervical spondylosis without myelopathy 12/18/2019   Chronic back pain    Chronic sinusitis    Degeneration of lumbar intervertebral disc 08/20/2020   ED (erectile dysfunction)    Enlarged prostate 12/15/2020   Essential hypertension    nuclear test in epic 01-16-2020 normal perfusion no ischemia, normal lvf and wall motion, nuclear ef 65%   Family history of hemochromatosis    Fatigue    Gastroesophageal reflux disease without esophagitis 12/18/2019   Generalized weakness    Glaucoma, both eyes    Hardening of the aorta (main artery of the heart)    Hemorrhoids    History of syncope    cardiology evaluation by [name], note in epic 12-26-2019, work-up includes event monitor 02-04-2020( showed SR with occ PAC & PVC rare  PAT)/ echo  12-26-2019 (mild LVH, ef 50-55%, moderate MR, mild to moderate AV sclerosis no stenosis, ascending aorta 39mm) and nuclear test 01-16-2020 (normal perfusion w/ normal LV and wall motion, nuclear ef 65%)   Hyperglycemia 12/18/2019   Hypogonadism in male    Leukocytosis 09/29/2020   Macular degeneration, right eye    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   Mixed hyperlipidemia    Onychomycosis 12/18/2019   Osteoarthritis of knee 12/18/2019   Pain in hand 12/18/2019   Parkinson's disease 08/2020   Renal stone 12/18/2019   Sebaceous cyst 12/18/2019   Seizure-like activity 09/29/2020   Spinal stenosis of lumbar region 08/20/2020   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   SHOULDER SURGERY Right 1990's   TONSILLECTOMY  age 45   TRANSURETHRAL RESECTION OF PROSTATE N/A 09/23/2020   Procedure: TRANSURETHRAL RESECTION OF THE PROSTATE (TURP);  Surgeon: [name], MD;  Location: [facility];  Service: Urology;  Laterality: N/A;  76 MINS     Family History  Problem Relation Age of Onset   Cancer Mother  Heart attack Father    Melanoma Father    Alzheimer's disease Sister    Heart disease Sister    Other Sister        Polio   Thyroid disease Daughter    Hemachromatosis Daughter    High blood pressure Daughter    Diabetes Daughter     Social History:  reports that he has never smoked. He has never used smokeless tobacco. He reports current alcohol use of about 5.0 - 6.0 standard drinks of alcohol per week. He reports that he does not use  drugs.   Exam: Current vital signs: Wt 82.4 kg   SpO2 98%   BMI 25.34 kg/m  Vital signs in last 24 hours: SpO2:  [98 %] 98 % (06/20 1850) Weight:  [82.4 kg] 82.4 kg (06/20 1800)   Physical Exam  Constitutional: Appears well-developed and well-nourished.  Psych: Affect mildly confused but calm Eyes: No scleral injection HENT: No oropharyngeal obstruction.  MSK: no major joint deformities.  Cardiovascular: Perfusing extremities well Respiratory: Effort normal, non-labored breathing GI: Soft.  No distension. There is no tenderness.  Skin: Warm dry and intact visible skin  Neuro: Mental Status: Patient is awake, alert, oriented to self but cannot answer month or age.  Names some simple objects but at times does not respond when asked to name things.  Unable to provide significant history.  No clear neglect.  Poor attention/concentration Cranial Nerves: II: Visual Fields are full. Pupils are equal, round, and reactive to light.   III,IV, VI: EOMI without ptosis or diploplia.  Saccadic pursuits V: Facial sensation is symmetric to temperature VII: Facial movement is symmetric.  VIII: hearing is intact to voice Motor: Good antigravity strength in all 4 extremities Sensory: Sensation symmetric in the bilateral legs.  In the arms he reports perhaps some diminished sensation but is unable to elaborate Cerebellar: Finger-nose intact bilaterally    NIHSS total 5 Score breakdown: 2 points for not being able to answer age or month, 1 point for sensory loss versus confusion, 1 point aphasia versus confusion, 1 point for dysarthria   I have reviewed labs in epic and the results pertinent to this consultation are:  Basic Metabolic Panel: Recent Labs  Lab 01/11/23 1812 01/11/23 1817  NA 140 140  K 4.2 4.2  CL 99 102  CO2 26  --   GLUCOSE 129* 122*  BUN 27* 29*  CREATININE 1.16 1.10  CALCIUM 10.2  --     CBC: Recent Labs  Lab 01/11/23 1812 01/11/23 1817  WBC 9.4  --    NEUTROABS 6.6  --   HGB 14.2 14.6  HCT 42.5 43.0  MCV 92.8  --   PLT 182  --     Coagulation Studies: Recent Labs    01/11/23 1812  LABPROT 14.5  INR 1.1      I have reviewed the images obtained:  Head CT without acute intracranial process on my read, radiology read reviewed and discussed with radiology  MRI brain personally reviewed, agree with radiology 1. No acute intracranial process. 2. Absent flow void in the V4 segment of the left vertebral artery, compatible with known occlusion. 3. Moderate left-sided mastoid effusion.  Impression: Most likely dementia related fluctuation however history is limited at the time of my evaluation given family is unavailable and patient cannot provide significant history.  Code stroke recommendations: -Emergent MRI to rule out intervenable lesion within a treatment window  Additional recommendations: -Toxic/metabolic/infectious workup per ED -Inpatient neurology  will be available as needed, please reach out if any additional questions or concerns arise   Brooke Dare MD-PhD [facility] [phone] Available 7 AM to 7 PM, outside these hours please contact Neurologist on call listed on AMION   Total critical care time: 45 minutes   Critical care time was exclusive of separately billable procedures and treating other patients.   Critical care was necessary to treat or prevent imminent or life-threatening deterioration.   Critical care was time spent personally by me on the following activities: development of treatment plan with patient and/or surrogate as well as nursing, discussions with consultants/primary team, evaluation of patient's response to treatment, examination of patient, obtaining history from patient or surrogate, ordering and performing treatments and interventions, ordering and review of laboratory studies, ordering and review of radiographic studies, and re-evaluation of patient's condition as  needed, as documented above.

## 2023-01-11 NOTE — Code Documentation (Addendum)
Stroke Response Nurse Documentation Code Documentation  Seth Carter is a 82 y.o. male arriving to [facility]  via [place] [facility] on 6/20 with past medical hx of Parkinson's disease, hyperglycemia. On No antithrombotic. Code stroke was activated by EMS.   Patient from home where he was LKW at 1600 and now complaining of disorientation. Per patient's family he has Parkinson's related episodes when he stares off into space and becomes disoriented for a brief period but always returns to his baseline, but today he did not snap out of it like usual so EMS was called.    Stroke team at the bedside on patient arrival. Labs drawn and patient cleared for CT by Dr. Charm Barges. Patient to CT with team. NIHSS 5, see documentation for details and code stroke times. Patient with disoriented, sensory deficit, aphasia, and dysarthria on exam, though it is difficult to distinguish the last three due to the patient's disorientation. The following imaging was completed:  CT Head and MRI. Patient is not a candidate for IV Thrombolytic due to no stroke on MRI per MD. Patient is not not a candidate for IR due to no LVO suspected.   Care Plan: cancel code stroke   Bedside handoff with ED RN [name].    Pearlie Oyster  Stroke Response RN

## 2023-01-11 NOTE — ED Notes (Signed)
Pt care taken, no complaints at this time. Md has canceled the stroke alert.

## 2023-01-11 NOTE — ED Provider Notes (Signed)
[place] EMERGENCY DEPARTMENT AT [facility] Provider Note   CSN: [id number] Arrival date & time: 01/11/23  1810  An emergency department physician performed an initial assessment on this suspected stroke patient at 56.  History  Chief Complaint  Patient presents with   Code Stroke    Seth Carter is a 82 y.o. male.  Pt is a 82 yo male with pmhx significant for Parkinson's disease, dementia, hld, ED, macular degeneration, glaucoma, chronic pain, htn, gerd, oa, and bph.  Pt had an episode this evening where he had trouble speaking and stared into space.  This has happened in the past with his Parkinson's disease, but the family was concerned for stroke.  Pt was admitted from 6/7-10 for a stroke work up and sx were thought to be due to orthostatic hypotension.  Pt's wife has been sick with a sinus infection.  He has not had any sinus issues.  Code stroke called by EMS.  PT met at the bridge by the stroke team.       Home Medications Prior to Admission medications   Medication Sig Start Date End Date Taking? Authorizing Provider  aspirin EC 81 MG tablet Take 81 mg by mouth at bedtime.    [provider]  carbidopa-levodopa (SINEMET IR) 25-100 MG tablet Take 2 tablets by mouth 3 (three) times daily.    [provider]  cetirizine (ZYRTEC) 10 MG tablet Take 10 mg by mouth daily.    [provider]  Cyanocobalamin (VITAMIN B-12 PO) Take 1 tablet by mouth daily with lunch.    [provider]  dorzolamide-timolol (COSOPT) 22.3-6.8 MG/ML ophthalmic solution Place 1 drop into both eyes 2 (two) times daily.    [provider]  fludrocortisone (FLORINEF) 0.1 MG tablet Take 1 tablet (0.1 mg total) by mouth every other day. 01/01/23   [name], MD  latanoprost (XALATAN) 0.005 % ophthalmic solution Place 1 drop into both eyes at bedtime.    [provider]  MAGNESIUM OXIDE PO Take 1 tablet by mouth at bedtime.    [provider]  Multiple Vitamin (MULTIVITAMIN WITH MINERALS) TABS tablet Take 1 tablet by mouth daily.    [provider]  Multiple Vitamins-Minerals (PRESERVISION/LUTEIN) CAPS Take 1 capsule by mouth daily with lunch.    [provider]  rosuvastatin (CRESTOR) 10 MG tablet Take 10 mg by mouth at bedtime.    [provider]      Allergies    Cialis [tadalafil], Pneumovax [pneumococcal polysaccharide vaccine], Zocor [simvastatin], Ace inhibitors, and Contrast media [iodinated contrast media]    Review of Systems   Review of Systems  Neurological:  Positive for speech difficulty.    Physical Exam Updated Vital Signs BP (!) 174/87   Pulse 70   Temp (!) 97.1 F (36.2 C)   Resp (!) 22   Wt 82.4 kg   SpO2 99%   BMI 25.34 kg/m  Physical Exam Vitals and nursing note reviewed.  Constitutional:      Appearance: Normal appearance.  HENT:     Head: Normocephalic and atraumatic.     Right Ear: External ear normal.     Left Ear: External ear normal.     Nose: Nose normal.     Mouth/Throat:     Mouth: Mucous membranes are moist.     Pharynx: Oropharynx is clear.  Eyes:     Extraocular Movements: Extraocular movements intact.     Conjunctiva/sclera: Conjunctivae normal.  Pupils: Pupils are equal, round, and reactive to light.  Cardiovascular:     Rate and Rhythm: Normal rate and regular rhythm.     Pulses: Normal pulses.     Heart sounds: Normal heart sounds.  Pulmonary:     Effort: Pulmonary effort is normal.     Breath sounds: Normal breath sounds.  Abdominal:     General: Abdomen is flat. Bowel sounds are normal.     Palpations: Abdomen is soft.  Musculoskeletal:        General: Normal range of motion.     Cervical back: Normal range of motion and neck supple.  Skin:    General: Skin is warm.     Capillary Refill: Capillary refill takes less than 2 seconds.  Neurological:     General: No focal deficit present.     Mental Status: He is  alert. Mental status is at baseline.     Motor: Tremor present.  Psychiatric:        Mood and Affect: Mood normal.        Behavior: Behavior normal.     ED Results / Procedures / Treatments   Labs (all labs ordered are listed, but only abnormal results are displayed) Labs Reviewed  COMPREHENSIVE METABOLIC PANEL - Abnormal; Notable for the following components:      Result Value   Glucose, Bld 129 (*)    BUN 27 (*)    All other components within normal limits  URINALYSIS, ROUTINE W REFLEX MICROSCOPIC - Abnormal; Notable for the following components:   Ketones, ur 5 (*)    All other components within normal limits  I-STAT CHEM 8, ED - Abnormal; Notable for the following components:   BUN 29 (*)    Glucose, Bld 122 (*)    All other components within normal limits  CBG MONITORING, ED - Abnormal; Notable for the following components:   Glucose-Capillary 121 (*)    All other components within normal limits  CBG MONITORING, ED - Abnormal; Notable for the following components:   Glucose-Capillary 115 (*)    All other components within normal limits  SARS CORONAVIRUS 2 BY RT PCR  ETHANOL  PROTIME-INR  APTT  CBC  DIFFERENTIAL  RAPID URINE DRUG SCREEN, HOSP PERFORMED    EKG EKG Interpretation  Date/Time:  Thursday January 11 2023 19:00:25 EDT Ventricular Rate:  71 PR Interval:  182 QRS Duration: 105 QT Interval:  422 QTC Calculation: 459 R Axis:   76 Text Interpretation: Sinus rhythm Minimal ST depression, lateral leads No significant change since last tracing Confirmed by Jacalyn Lefevre [id number]) on 01/11/2023 8:12:14 PM  Radiology DG Chest Portable 1 View  Result Date: 01/11/2023 CLINICAL DATA:  Cough. Expressive aphasia. History of Parkinson's and dementia. EXAM: PORTABLE CHEST 1 VIEW COMPARISON:  04/25/2022. FINDINGS: The heart size and mediastinal contours are within normal limits. Both lungs are clear. No acute osseous abnormality. IMPRESSION: No active disease.  Electronically Signed   By: Thornell Sartorius M.D.   On: 01/11/2023 20:46   MR BRAIN WO CONTRAST  Result Date: 01/11/2023 CLINICAL DATA:  Neuro deficit, acute, stroke suspected EXAM: MRI HEAD WITHOUT CONTRAST TECHNIQUE: Multiplanar, multiecho pulse sequences of the brain and surrounding structures were obtained without intravenous contrast. COMPARISON:  Same day CT head FINDINGS: Brain: Negative for an acute infarct. No hemorrhage. No hydrocephalus. No extra-axial fluid collection. Sequela of mild overall chronic microvascular ischemic change. Generalized volume loss. Chronic infarct in the right basal ganglia. Vascular: Normal flow void in  the basilar artery. Absent flow void in the V4 segment of the left vertebral artery, compatible with known occlusion. Skull and upper cervical spine: Normal marrow signal. Sinuses/Orbits: Moderate left-sided mastoid effusion. No middle ear effusion. Paranasal sinuses are notable for mild mucosal thickening in the posterior ethmoid air cells bilaterally. Bilateral lens replacement. Orbits are otherwise unremarkable. Other: None. IMPRESSION: 1. No acute intracranial process. 2. Absent flow void in the V4 segment of the left vertebral artery, compatible with known occlusion. 3. Moderate left-sided mastoid effusion. Electronically Signed   By: Lorenza Cambridge M.D.   On: 01/11/2023 18:50   CT HEAD CODE STROKE WO CONTRAST  Result Date: 01/11/2023 CLINICAL DATA:  Code stroke. Neuro deficit, acute, stroke suspected no lateralizing signs provided EXAM: CT HEAD WITHOUT CONTRAST TECHNIQUE: Contiguous axial images were obtained from the base of the skull through the vertex without intravenous contrast. RADIATION DOSE REDUCTION: This exam was performed according to the departmental dose-optimization program which includes automated exposure control, adjustment of the mA and/or kV according to patient size and/or use of iterative reconstruction technique. COMPARISON:  CT Head 12/29/22  FINDINGS: Brain: No evidence of acute cortical infarction, hemorrhage, hydrocephalus, extra-axial collection or mass lesion/mass effect. Somewhat hypodense appearance of the pons may be artifactual in the setting of streak artifact at the skull base, but a pontine infarct is not excluded. Sequela of mild chronic microvascular ischemic change with likely chronic infarcts in the bilateral basal ganglia. Vascular: Hyperdense appearance of the basilar artery (series 6, image 34). Skull: Normal. Negative for fracture or focal lesion. Sinuses/Orbits: Small left mastoid effusion. No middle ear effusion. Paranasal sinuses are clear. Bilateral lens replacement. Orbits are otherwise unremarkable Other: None ASPECTS ([place] Stroke Program Early CT Score): 10 IMPRESSION: Somewhat hypodense appearance of the pons may be artifactual in the setting of streak artifact at the skull base, but a pontine infarct is not excluded. If there is clinical concern for an acute infarct, consider MRI for further evaluation. Findings were paged to Dr. Iver Nestle at 6:30 PM on 01/11/23 via [facility] paging system. Electronically Signed   By: Lorenza Cambridge M.D.   On: 01/11/2023 18:34    Procedures Procedures    Medications Ordered in ED Medications - No data to display  ED Course/ Medical Decision Making/ A&P                             Medical Decision Making Amount and/or Complexity of Data Reviewed Labs: ordered. Radiology: ordered.   This patient presents to the ED for concern of speech difficulty, this involves an extensive number of treatment options, and is a complaint that carries with it a high risk of complications and morbidity.  The differential diagnosis includes parkinson's, cva, infection, electrolyte abn   Co morbidities that complicate the patient evaluation  Parkinson's disease, dementia, hld, ED, macular degeneration, glaucoma, chronic pain, htn, gerd, oa, and bph   Additional history obtained:  Additional  history obtained from epic chart review External records from outside source obtained and reviewed including EMS report/granddaughter   Lab Tests:  I Ordered, and personally interpreted labs.  The pertinent results include:  cbc nl, cmp nl, etoh neg, inr 1.1, covid neg, uds neg, ua with + ketones   Imaging Studies ordered:  I ordered imaging studies including ct head, mri brain, cxr  I independently visualized and interpreted imaging which showed  CT head: Somewhat hypodense appearance of the pons may be artifactual  in the  setting of streak artifact at the skull base, but a pontine infarct  is not excluded. If there is clinical concern for an acute infarct,  consider MRI for further evaluation.  MRI: No acute intracranial process.  2. Absent flow void in the V4 segment of the left vertebral artery,  compatible with known occlusion.  3. Moderate left-sided mastoid effusion.  CXR: No active disease.  I agree with the radiologist interpretation   Cardiac Monitoring:  The patient was maintained on a cardiac monitor.  I personally viewed and interpreted the cardiac monitored which showed an underlying rhythm of: nsr   Medicines ordered and prescription drug management:   I have reviewed the patients home medicines and have made adjustments as needed   Test Considered:  Ct/mri   Critical Interventions:  Code stroke   Consultations Obtained:  I requested consultation with the neurologist (Dr. Iver Nestle),  and discussed lab and imaging findings as well as pertinent plan - she recommends metabolic w/u.  He does not meet criteria for tnk and mri neg for cva.  Pt just had work up this month.   Problem List / ED Course:  AMS:  likely due to Parkinson's.  He is awake and alert now.  Work up neg.  Pt is stable for d/c.  Return if worse.   Reevaluation:  After the interventions noted above, I reevaluated the patient and found that they have :improved   Social  Determinants of Health:  Lives at home.  He has a caregiver 24 hours per day.  Granddaughter will take home.   Dispostion:  After consideration of the diagnostic results and the patients response to treatment, I feel that the patent would benefit from discharge with outpatient f/u.  CRITICAL CARE Performed by: Jacalyn Lefevre   Total critical care time: 30 minutes  Critical care time was exclusive of separately billable procedures and treating other patients.  Critical care was necessary to treat or prevent imminent or life-threatening deterioration.  Critical care was time spent personally by me on the following activities: development of treatment plan with patient and/or surrogate as well as nursing, discussions with consultants, evaluation of patient's response to treatment, examination of patient, obtaining history from patient or surrogate, ordering and performing treatments and interventions, ordering and review of laboratory studies, ordering and review of radiographic studies, pulse oximetry and re-evaluation of patient's condition.           Final Clinical Impression(s) / ED Diagnoses Final diagnoses:  Parkinson's disease with fluctuating manifestations, unspecified whether dyskinesia present    Rx / DC Orders ED Discharge Orders     None         Jacalyn Lefevre, MD 01/11/23 2202

## 2023-01-14 DIAGNOSIS — I951 Orthostatic hypotension: Secondary | ICD-10-CM | POA: Diagnosis not present

## 2023-01-14 DIAGNOSIS — F0670 Mild neurocognitive disorder due to known physiological condition without behavioral disturbance: Secondary | ICD-10-CM | POA: Diagnosis not present

## 2023-01-14 DIAGNOSIS — H409 Unspecified glaucoma: Secondary | ICD-10-CM | POA: Diagnosis not present

## 2023-01-14 DIAGNOSIS — N401 Enlarged prostate with lower urinary tract symptoms: Secondary | ICD-10-CM | POA: Diagnosis not present

## 2023-01-14 DIAGNOSIS — E538 Deficiency of other specified B group vitamins: Secondary | ICD-10-CM | POA: Diagnosis not present

## 2023-01-14 DIAGNOSIS — G20A1 Parkinson's disease without dyskinesia, without mention of fluctuations: Secondary | ICD-10-CM | POA: Diagnosis not present

## 2023-01-16 DIAGNOSIS — H409 Unspecified glaucoma: Secondary | ICD-10-CM | POA: Diagnosis not present

## 2023-01-16 DIAGNOSIS — I951 Orthostatic hypotension: Secondary | ICD-10-CM | POA: Diagnosis not present

## 2023-01-16 DIAGNOSIS — N401 Enlarged prostate with lower urinary tract symptoms: Secondary | ICD-10-CM | POA: Diagnosis not present

## 2023-01-16 DIAGNOSIS — F0670 Mild neurocognitive disorder due to known physiological condition without behavioral disturbance: Secondary | ICD-10-CM | POA: Diagnosis not present

## 2023-01-16 DIAGNOSIS — E538 Deficiency of other specified B group vitamins: Secondary | ICD-10-CM | POA: Diagnosis not present

## 2023-01-16 DIAGNOSIS — G20A1 Parkinson's disease without dyskinesia, without mention of fluctuations: Secondary | ICD-10-CM | POA: Diagnosis not present

## 2023-01-17 DIAGNOSIS — N401 Enlarged prostate with lower urinary tract symptoms: Secondary | ICD-10-CM | POA: Diagnosis not present

## 2023-01-17 DIAGNOSIS — I951 Orthostatic hypotension: Secondary | ICD-10-CM | POA: Diagnosis not present

## 2023-01-17 DIAGNOSIS — H409 Unspecified glaucoma: Secondary | ICD-10-CM | POA: Diagnosis not present

## 2023-01-17 DIAGNOSIS — E538 Deficiency of other specified B group vitamins: Secondary | ICD-10-CM | POA: Diagnosis not present

## 2023-01-17 DIAGNOSIS — F0670 Mild neurocognitive disorder due to known physiological condition without behavioral disturbance: Secondary | ICD-10-CM | POA: Diagnosis not present

## 2023-01-17 DIAGNOSIS — G20A1 Parkinson's disease without dyskinesia, without mention of fluctuations: Secondary | ICD-10-CM | POA: Diagnosis not present

## 2023-01-20 DIAGNOSIS — Z111 Encounter for screening for respiratory tuberculosis: Secondary | ICD-10-CM | POA: Diagnosis not present

## 2023-01-22 DIAGNOSIS — Z111 Encounter for screening for respiratory tuberculosis: Secondary | ICD-10-CM | POA: Diagnosis not present

## 2023-01-23 DIAGNOSIS — Z8673 Personal history of transient ischemic attack (TIA), and cerebral infarction without residual deficits: Secondary | ICD-10-CM | POA: Diagnosis not present

## 2023-01-23 DIAGNOSIS — G20A1 Parkinson's disease without dyskinesia, without mention of fluctuations: Secondary | ICD-10-CM | POA: Diagnosis not present

## 2023-01-23 DIAGNOSIS — G903 Multi-system degeneration of the autonomic nervous system: Secondary | ICD-10-CM | POA: Diagnosis not present

## 2023-01-25 DIAGNOSIS — G20A1 Parkinson's disease without dyskinesia, without mention of fluctuations: Secondary | ICD-10-CM | POA: Diagnosis not present

## 2023-01-25 DIAGNOSIS — H409 Unspecified glaucoma: Secondary | ICD-10-CM | POA: Diagnosis not present

## 2023-01-25 DIAGNOSIS — I951 Orthostatic hypotension: Secondary | ICD-10-CM | POA: Diagnosis not present

## 2023-01-25 DIAGNOSIS — E538 Deficiency of other specified B group vitamins: Secondary | ICD-10-CM | POA: Diagnosis not present

## 2023-01-25 DIAGNOSIS — N401 Enlarged prostate with lower urinary tract symptoms: Secondary | ICD-10-CM | POA: Diagnosis not present

## 2023-01-25 DIAGNOSIS — F0670 Mild neurocognitive disorder due to known physiological condition without behavioral disturbance: Secondary | ICD-10-CM | POA: Diagnosis not present

## 2023-01-28 DIAGNOSIS — F0670 Mild neurocognitive disorder due to known physiological condition without behavioral disturbance: Secondary | ICD-10-CM | POA: Diagnosis not present

## 2023-01-28 DIAGNOSIS — H409 Unspecified glaucoma: Secondary | ICD-10-CM | POA: Diagnosis not present

## 2023-01-28 DIAGNOSIS — I951 Orthostatic hypotension: Secondary | ICD-10-CM | POA: Diagnosis not present

## 2023-01-28 DIAGNOSIS — E538 Deficiency of other specified B group vitamins: Secondary | ICD-10-CM | POA: Diagnosis not present

## 2023-01-28 DIAGNOSIS — N401 Enlarged prostate with lower urinary tract symptoms: Secondary | ICD-10-CM | POA: Diagnosis not present

## 2023-01-28 DIAGNOSIS — G20A1 Parkinson's disease without dyskinesia, without mention of fluctuations: Secondary | ICD-10-CM | POA: Diagnosis not present

## 2023-02-02 DIAGNOSIS — M6281 Muscle weakness (generalized): Secondary | ICD-10-CM | POA: Diagnosis not present

## 2023-02-02 DIAGNOSIS — R2689 Other abnormalities of gait and mobility: Secondary | ICD-10-CM | POA: Diagnosis not present

## 2023-02-06 DIAGNOSIS — E559 Vitamin D deficiency, unspecified: Secondary | ICD-10-CM | POA: Diagnosis not present

## 2023-02-06 DIAGNOSIS — E785 Hyperlipidemia, unspecified: Secondary | ICD-10-CM | POA: Diagnosis not present

## 2023-02-06 DIAGNOSIS — R2689 Other abnormalities of gait and mobility: Secondary | ICD-10-CM | POA: Diagnosis not present

## 2023-02-06 DIAGNOSIS — E538 Deficiency of other specified B group vitamins: Secondary | ICD-10-CM | POA: Diagnosis not present

## 2023-02-06 DIAGNOSIS — B351 Tinea unguium: Secondary | ICD-10-CM | POA: Diagnosis not present

## 2023-02-06 DIAGNOSIS — H409 Unspecified glaucoma: Secondary | ICD-10-CM | POA: Diagnosis not present

## 2023-02-06 DIAGNOSIS — F32A Depression, unspecified: Secondary | ICD-10-CM | POA: Diagnosis not present

## 2023-02-06 DIAGNOSIS — J309 Allergic rhinitis, unspecified: Secondary | ICD-10-CM | POA: Diagnosis not present

## 2023-02-06 DIAGNOSIS — I951 Orthostatic hypotension: Secondary | ICD-10-CM | POA: Diagnosis not present

## 2023-02-07 DIAGNOSIS — M6281 Muscle weakness (generalized): Secondary | ICD-10-CM | POA: Diagnosis not present

## 2023-02-07 DIAGNOSIS — R4701 Aphasia: Secondary | ICD-10-CM | POA: Diagnosis not present

## 2023-02-07 DIAGNOSIS — R41841 Cognitive communication deficit: Secondary | ICD-10-CM | POA: Diagnosis not present

## 2023-02-07 DIAGNOSIS — R490 Dysphonia: Secondary | ICD-10-CM | POA: Diagnosis not present

## 2023-02-08 DIAGNOSIS — Z13220 Encounter for screening for lipoid disorders: Secondary | ICD-10-CM | POA: Diagnosis not present

## 2023-02-08 DIAGNOSIS — E119 Type 2 diabetes mellitus without complications: Secondary | ICD-10-CM | POA: Diagnosis not present

## 2023-02-08 DIAGNOSIS — R2689 Other abnormalities of gait and mobility: Secondary | ICD-10-CM | POA: Diagnosis not present

## 2023-02-08 DIAGNOSIS — R490 Dysphonia: Secondary | ICD-10-CM | POA: Diagnosis not present

## 2023-02-08 DIAGNOSIS — Z13228 Encounter for screening for other metabolic disorders: Secondary | ICD-10-CM | POA: Diagnosis not present

## 2023-02-08 DIAGNOSIS — E079 Disorder of thyroid, unspecified: Secondary | ICD-10-CM | POA: Diagnosis not present

## 2023-02-08 DIAGNOSIS — R41841 Cognitive communication deficit: Secondary | ICD-10-CM | POA: Diagnosis not present

## 2023-02-08 DIAGNOSIS — R4701 Aphasia: Secondary | ICD-10-CM | POA: Diagnosis not present

## 2023-02-09 DIAGNOSIS — R41841 Cognitive communication deficit: Secondary | ICD-10-CM | POA: Diagnosis not present

## 2023-02-09 DIAGNOSIS — M6281 Muscle weakness (generalized): Secondary | ICD-10-CM | POA: Diagnosis not present

## 2023-02-09 DIAGNOSIS — R490 Dysphonia: Secondary | ICD-10-CM | POA: Diagnosis not present

## 2023-02-09 DIAGNOSIS — R4701 Aphasia: Secondary | ICD-10-CM | POA: Diagnosis not present

## 2023-02-13 DIAGNOSIS — R41841 Cognitive communication deficit: Secondary | ICD-10-CM | POA: Diagnosis not present

## 2023-02-13 DIAGNOSIS — R2689 Other abnormalities of gait and mobility: Secondary | ICD-10-CM | POA: Diagnosis not present

## 2023-02-13 DIAGNOSIS — R4701 Aphasia: Secondary | ICD-10-CM | POA: Diagnosis not present

## 2023-02-13 DIAGNOSIS — R490 Dysphonia: Secondary | ICD-10-CM | POA: Diagnosis not present

## 2023-02-14 DIAGNOSIS — R4701 Aphasia: Secondary | ICD-10-CM | POA: Diagnosis not present

## 2023-02-14 DIAGNOSIS — R490 Dysphonia: Secondary | ICD-10-CM | POA: Diagnosis not present

## 2023-02-14 DIAGNOSIS — R41841 Cognitive communication deficit: Secondary | ICD-10-CM | POA: Diagnosis not present

## 2023-02-15 DIAGNOSIS — R41841 Cognitive communication deficit: Secondary | ICD-10-CM | POA: Diagnosis not present

## 2023-02-15 DIAGNOSIS — R4701 Aphasia: Secondary | ICD-10-CM | POA: Diagnosis not present

## 2023-02-15 DIAGNOSIS — R2689 Other abnormalities of gait and mobility: Secondary | ICD-10-CM | POA: Diagnosis not present

## 2023-02-15 DIAGNOSIS — R490 Dysphonia: Secondary | ICD-10-CM | POA: Diagnosis not present

## 2023-02-16 DIAGNOSIS — R4701 Aphasia: Secondary | ICD-10-CM | POA: Diagnosis not present

## 2023-02-16 DIAGNOSIS — M6281 Muscle weakness (generalized): Secondary | ICD-10-CM | POA: Diagnosis not present

## 2023-02-16 DIAGNOSIS — R490 Dysphonia: Secondary | ICD-10-CM | POA: Diagnosis not present

## 2023-02-16 DIAGNOSIS — R41841 Cognitive communication deficit: Secondary | ICD-10-CM | POA: Diagnosis not present

## 2023-02-20 DIAGNOSIS — R490 Dysphonia: Secondary | ICD-10-CM | POA: Diagnosis not present

## 2023-02-20 DIAGNOSIS — R41841 Cognitive communication deficit: Secondary | ICD-10-CM | POA: Diagnosis not present

## 2023-02-20 DIAGNOSIS — R4701 Aphasia: Secondary | ICD-10-CM | POA: Diagnosis not present

## 2023-02-20 DIAGNOSIS — R2689 Other abnormalities of gait and mobility: Secondary | ICD-10-CM | POA: Diagnosis not present

## 2023-02-21 DIAGNOSIS — R490 Dysphonia: Secondary | ICD-10-CM | POA: Diagnosis not present

## 2023-02-21 DIAGNOSIS — M6281 Muscle weakness (generalized): Secondary | ICD-10-CM | POA: Diagnosis not present

## 2023-02-21 DIAGNOSIS — R41841 Cognitive communication deficit: Secondary | ICD-10-CM | POA: Diagnosis not present

## 2023-02-21 DIAGNOSIS — R4701 Aphasia: Secondary | ICD-10-CM | POA: Diagnosis not present

## 2023-02-22 DIAGNOSIS — R41841 Cognitive communication deficit: Secondary | ICD-10-CM | POA: Diagnosis not present

## 2023-02-22 DIAGNOSIS — R490 Dysphonia: Secondary | ICD-10-CM | POA: Diagnosis not present

## 2023-02-22 DIAGNOSIS — R4701 Aphasia: Secondary | ICD-10-CM | POA: Diagnosis not present

## 2023-02-22 DIAGNOSIS — R2689 Other abnormalities of gait and mobility: Secondary | ICD-10-CM | POA: Diagnosis not present

## 2023-02-23 DIAGNOSIS — R490 Dysphonia: Secondary | ICD-10-CM | POA: Diagnosis not present

## 2023-02-23 DIAGNOSIS — R41841 Cognitive communication deficit: Secondary | ICD-10-CM | POA: Diagnosis not present

## 2023-02-23 DIAGNOSIS — R4701 Aphasia: Secondary | ICD-10-CM | POA: Diagnosis not present

## 2023-02-23 DIAGNOSIS — M6281 Muscle weakness (generalized): Secondary | ICD-10-CM | POA: Diagnosis not present

## 2023-02-27 DIAGNOSIS — R4701 Aphasia: Secondary | ICD-10-CM | POA: Diagnosis not present

## 2023-02-27 DIAGNOSIS — R2689 Other abnormalities of gait and mobility: Secondary | ICD-10-CM | POA: Diagnosis not present

## 2023-02-27 DIAGNOSIS — R490 Dysphonia: Secondary | ICD-10-CM | POA: Diagnosis not present

## 2023-02-27 DIAGNOSIS — R41841 Cognitive communication deficit: Secondary | ICD-10-CM | POA: Diagnosis not present

## 2023-02-28 DIAGNOSIS — M6281 Muscle weakness (generalized): Secondary | ICD-10-CM | POA: Diagnosis not present

## 2023-02-28 DIAGNOSIS — R011 Cardiac murmur, unspecified: Secondary | ICD-10-CM | POA: Diagnosis not present

## 2023-02-28 DIAGNOSIS — R4701 Aphasia: Secondary | ICD-10-CM | POA: Diagnosis not present

## 2023-02-28 DIAGNOSIS — R490 Dysphonia: Secondary | ICD-10-CM | POA: Diagnosis not present

## 2023-02-28 DIAGNOSIS — R41841 Cognitive communication deficit: Secondary | ICD-10-CM | POA: Diagnosis not present

## 2023-03-01 DIAGNOSIS — R4701 Aphasia: Secondary | ICD-10-CM | POA: Diagnosis not present

## 2023-03-01 DIAGNOSIS — R41841 Cognitive communication deficit: Secondary | ICD-10-CM | POA: Diagnosis not present

## 2023-03-01 DIAGNOSIS — R490 Dysphonia: Secondary | ICD-10-CM | POA: Diagnosis not present

## 2023-03-01 DIAGNOSIS — I6523 Occlusion and stenosis of bilateral carotid arteries: Secondary | ICD-10-CM | POA: Diagnosis not present

## 2023-03-01 DIAGNOSIS — R0989 Other specified symptoms and signs involving the circulatory and respiratory systems: Secondary | ICD-10-CM | POA: Diagnosis not present

## 2023-03-02 DIAGNOSIS — E042 Nontoxic multinodular goiter: Secondary | ICD-10-CM | POA: Diagnosis not present

## 2023-03-02 DIAGNOSIS — R2689 Other abnormalities of gait and mobility: Secondary | ICD-10-CM | POA: Diagnosis not present

## 2023-03-02 DIAGNOSIS — M6281 Muscle weakness (generalized): Secondary | ICD-10-CM | POA: Diagnosis not present

## 2023-03-02 DIAGNOSIS — R221 Localized swelling, mass and lump, neck: Secondary | ICD-10-CM | POA: Diagnosis not present

## 2023-03-05 DIAGNOSIS — K5900 Constipation, unspecified: Secondary | ICD-10-CM | POA: Diagnosis not present

## 2023-03-05 DIAGNOSIS — I77811 Abdominal aortic ectasia: Secondary | ICD-10-CM | POA: Diagnosis not present

## 2023-03-05 DIAGNOSIS — G20A1 Parkinson's disease without dyskinesia, without mention of fluctuations: Secondary | ICD-10-CM | POA: Diagnosis not present

## 2023-03-05 DIAGNOSIS — F0280 Dementia in other diseases classified elsewhere without behavioral disturbance: Secondary | ICD-10-CM | POA: Diagnosis not present

## 2023-03-05 DIAGNOSIS — R4701 Aphasia: Secondary | ICD-10-CM | POA: Diagnosis not present

## 2023-03-05 DIAGNOSIS — Z79899 Other long term (current) drug therapy: Secondary | ICD-10-CM | POA: Diagnosis not present

## 2023-03-05 DIAGNOSIS — G903 Multi-system degeneration of the autonomic nervous system: Secondary | ICD-10-CM | POA: Diagnosis not present

## 2023-03-06 DIAGNOSIS — I951 Orthostatic hypotension: Secondary | ICD-10-CM | POA: Diagnosis not present

## 2023-03-06 DIAGNOSIS — E785 Hyperlipidemia, unspecified: Secondary | ICD-10-CM | POA: Diagnosis not present

## 2023-03-06 DIAGNOSIS — R4701 Aphasia: Secondary | ICD-10-CM | POA: Diagnosis not present

## 2023-03-06 DIAGNOSIS — F0390 Unspecified dementia without behavioral disturbance: Secondary | ICD-10-CM | POA: Diagnosis not present

## 2023-03-06 DIAGNOSIS — E559 Vitamin D deficiency, unspecified: Secondary | ICD-10-CM | POA: Diagnosis not present

## 2023-03-06 DIAGNOSIS — I70223 Atherosclerosis of native arteries of extremities with rest pain, bilateral legs: Secondary | ICD-10-CM | POA: Diagnosis not present

## 2023-03-06 DIAGNOSIS — E538 Deficiency of other specified B group vitamins: Secondary | ICD-10-CM | POA: Diagnosis not present

## 2023-03-06 DIAGNOSIS — F32A Depression, unspecified: Secondary | ICD-10-CM | POA: Diagnosis not present

## 2023-03-06 DIAGNOSIS — R41841 Cognitive communication deficit: Secondary | ICD-10-CM | POA: Diagnosis not present

## 2023-03-06 DIAGNOSIS — G20A1 Parkinson's disease without dyskinesia, without mention of fluctuations: Secondary | ICD-10-CM | POA: Diagnosis not present

## 2023-03-06 DIAGNOSIS — R490 Dysphonia: Secondary | ICD-10-CM | POA: Diagnosis not present

## 2023-03-07 DIAGNOSIS — D3131 Benign neoplasm of right choroid: Secondary | ICD-10-CM | POA: Diagnosis not present

## 2023-03-07 DIAGNOSIS — R590 Localized enlarged lymph nodes: Secondary | ICD-10-CM | POA: Diagnosis not present

## 2023-03-07 DIAGNOSIS — H26491 Other secondary cataract, right eye: Secondary | ICD-10-CM | POA: Diagnosis not present

## 2023-03-07 DIAGNOSIS — R490 Dysphonia: Secondary | ICD-10-CM | POA: Diagnosis not present

## 2023-03-07 DIAGNOSIS — H04123 Dry eye syndrome of bilateral lacrimal glands: Secondary | ICD-10-CM | POA: Diagnosis not present

## 2023-03-07 DIAGNOSIS — R41841 Cognitive communication deficit: Secondary | ICD-10-CM | POA: Diagnosis not present

## 2023-03-07 DIAGNOSIS — H401131 Primary open-angle glaucoma, bilateral, mild stage: Secondary | ICD-10-CM | POA: Diagnosis not present

## 2023-03-07 DIAGNOSIS — Q141 Congenital malformation of retina: Secondary | ICD-10-CM | POA: Diagnosis not present

## 2023-03-07 DIAGNOSIS — H353131 Nonexudative age-related macular degeneration, bilateral, early dry stage: Secondary | ICD-10-CM | POA: Diagnosis not present

## 2023-03-07 DIAGNOSIS — R4701 Aphasia: Secondary | ICD-10-CM | POA: Diagnosis not present

## 2023-03-08 DIAGNOSIS — R4701 Aphasia: Secondary | ICD-10-CM | POA: Diagnosis not present

## 2023-03-08 DIAGNOSIS — R490 Dysphonia: Secondary | ICD-10-CM | POA: Diagnosis not present

## 2023-03-08 DIAGNOSIS — R41841 Cognitive communication deficit: Secondary | ICD-10-CM | POA: Diagnosis not present

## 2023-03-09 DIAGNOSIS — M6281 Muscle weakness (generalized): Secondary | ICD-10-CM | POA: Diagnosis not present

## 2023-03-09 DIAGNOSIS — R41841 Cognitive communication deficit: Secondary | ICD-10-CM | POA: Diagnosis not present

## 2023-03-09 DIAGNOSIS — R4701 Aphasia: Secondary | ICD-10-CM | POA: Diagnosis not present

## 2023-03-09 DIAGNOSIS — R490 Dysphonia: Secondary | ICD-10-CM | POA: Diagnosis not present

## 2023-03-09 DIAGNOSIS — R2232 Localized swelling, mass and lump, left upper limb: Secondary | ICD-10-CM | POA: Diagnosis not present

## 2023-03-13 DIAGNOSIS — R4701 Aphasia: Secondary | ICD-10-CM | POA: Diagnosis not present

## 2023-03-13 DIAGNOSIS — R490 Dysphonia: Secondary | ICD-10-CM | POA: Diagnosis not present

## 2023-03-13 DIAGNOSIS — R41841 Cognitive communication deficit: Secondary | ICD-10-CM | POA: Diagnosis not present

## 2023-03-14 DIAGNOSIS — R41841 Cognitive communication deficit: Secondary | ICD-10-CM | POA: Diagnosis not present

## 2023-03-14 DIAGNOSIS — R4701 Aphasia: Secondary | ICD-10-CM | POA: Diagnosis not present

## 2023-03-14 DIAGNOSIS — R490 Dysphonia: Secondary | ICD-10-CM | POA: Diagnosis not present

## 2023-03-14 DIAGNOSIS — M6281 Muscle weakness (generalized): Secondary | ICD-10-CM | POA: Diagnosis not present

## 2023-03-16 DIAGNOSIS — R490 Dysphonia: Secondary | ICD-10-CM | POA: Diagnosis not present

## 2023-03-16 DIAGNOSIS — M6281 Muscle weakness (generalized): Secondary | ICD-10-CM | POA: Diagnosis not present

## 2023-03-16 DIAGNOSIS — R41841 Cognitive communication deficit: Secondary | ICD-10-CM | POA: Diagnosis not present

## 2023-03-16 DIAGNOSIS — R4701 Aphasia: Secondary | ICD-10-CM | POA: Diagnosis not present

## 2023-03-19 DIAGNOSIS — I669 Occlusion and stenosis of unspecified cerebral artery: Secondary | ICD-10-CM | POA: Diagnosis not present

## 2023-03-19 DIAGNOSIS — Z8673 Personal history of transient ischemic attack (TIA), and cerebral infarction without residual deficits: Secondary | ICD-10-CM | POA: Diagnosis not present

## 2023-03-19 DIAGNOSIS — I6502 Occlusion and stenosis of left vertebral artery: Secondary | ICD-10-CM | POA: Diagnosis not present

## 2023-03-20 DIAGNOSIS — R4701 Aphasia: Secondary | ICD-10-CM | POA: Diagnosis not present

## 2023-03-20 DIAGNOSIS — R41841 Cognitive communication deficit: Secondary | ICD-10-CM | POA: Diagnosis not present

## 2023-03-20 DIAGNOSIS — R490 Dysphonia: Secondary | ICD-10-CM | POA: Diagnosis not present

## 2023-03-20 DIAGNOSIS — F4320 Adjustment disorder, unspecified: Secondary | ICD-10-CM | POA: Diagnosis not present

## 2023-03-20 DIAGNOSIS — F413 Other mixed anxiety disorders: Secondary | ICD-10-CM | POA: Diagnosis not present

## 2023-03-20 DIAGNOSIS — F32A Depression, unspecified: Secondary | ICD-10-CM | POA: Diagnosis not present

## 2023-03-21 ENCOUNTER — Encounter (INDEPENDENT_AMBULATORY_CARE_PROVIDER_SITE_OTHER): Payer: Medicare Other | Admitting: Ophthalmology

## 2023-03-21 DIAGNOSIS — H348322 Tributary (branch) retinal vein occlusion, left eye, stable: Secondary | ICD-10-CM | POA: Diagnosis not present

## 2023-03-21 DIAGNOSIS — H35033 Hypertensive retinopathy, bilateral: Secondary | ICD-10-CM | POA: Diagnosis not present

## 2023-03-21 DIAGNOSIS — H353132 Nonexudative age-related macular degeneration, bilateral, intermediate dry stage: Secondary | ICD-10-CM | POA: Diagnosis not present

## 2023-03-21 DIAGNOSIS — I10 Essential (primary) hypertension: Secondary | ICD-10-CM

## 2023-03-21 DIAGNOSIS — H33302 Unspecified retinal break, left eye: Secondary | ICD-10-CM | POA: Diagnosis not present

## 2023-03-21 DIAGNOSIS — D3131 Benign neoplasm of right choroid: Secondary | ICD-10-CM | POA: Diagnosis not present

## 2023-03-21 DIAGNOSIS — H43813 Vitreous degeneration, bilateral: Secondary | ICD-10-CM | POA: Diagnosis not present

## 2023-03-21 DIAGNOSIS — M6281 Muscle weakness (generalized): Secondary | ICD-10-CM | POA: Diagnosis not present

## 2023-03-22 DIAGNOSIS — R490 Dysphonia: Secondary | ICD-10-CM | POA: Diagnosis not present

## 2023-03-22 DIAGNOSIS — R4701 Aphasia: Secondary | ICD-10-CM | POA: Diagnosis not present

## 2023-03-22 DIAGNOSIS — R41841 Cognitive communication deficit: Secondary | ICD-10-CM | POA: Diagnosis not present

## 2023-03-23 DIAGNOSIS — R4701 Aphasia: Secondary | ICD-10-CM | POA: Diagnosis not present

## 2023-03-23 DIAGNOSIS — R41841 Cognitive communication deficit: Secondary | ICD-10-CM | POA: Diagnosis not present

## 2023-03-23 DIAGNOSIS — R490 Dysphonia: Secondary | ICD-10-CM | POA: Diagnosis not present

## 2023-03-24 DIAGNOSIS — R2689 Other abnormalities of gait and mobility: Secondary | ICD-10-CM | POA: Diagnosis not present

## 2023-03-26 DIAGNOSIS — R2689 Other abnormalities of gait and mobility: Secondary | ICD-10-CM | POA: Diagnosis not present

## 2023-03-27 DIAGNOSIS — R4701 Aphasia: Secondary | ICD-10-CM | POA: Diagnosis not present

## 2023-03-27 DIAGNOSIS — R490 Dysphonia: Secondary | ICD-10-CM | POA: Diagnosis not present

## 2023-03-27 DIAGNOSIS — R41841 Cognitive communication deficit: Secondary | ICD-10-CM | POA: Diagnosis not present

## 2023-03-28 DIAGNOSIS — R4701 Aphasia: Secondary | ICD-10-CM | POA: Diagnosis not present

## 2023-03-28 DIAGNOSIS — R490 Dysphonia: Secondary | ICD-10-CM | POA: Diagnosis not present

## 2023-03-28 DIAGNOSIS — R41841 Cognitive communication deficit: Secondary | ICD-10-CM | POA: Diagnosis not present

## 2023-03-29 DIAGNOSIS — R490 Dysphonia: Secondary | ICD-10-CM | POA: Diagnosis not present

## 2023-03-29 DIAGNOSIS — R4701 Aphasia: Secondary | ICD-10-CM | POA: Diagnosis not present

## 2023-03-29 DIAGNOSIS — R2689 Other abnormalities of gait and mobility: Secondary | ICD-10-CM | POA: Diagnosis not present

## 2023-03-29 DIAGNOSIS — R41841 Cognitive communication deficit: Secondary | ICD-10-CM | POA: Diagnosis not present

## 2023-03-30 DIAGNOSIS — R41841 Cognitive communication deficit: Secondary | ICD-10-CM | POA: Diagnosis not present

## 2023-03-30 DIAGNOSIS — R490 Dysphonia: Secondary | ICD-10-CM | POA: Diagnosis not present

## 2023-03-30 DIAGNOSIS — R4701 Aphasia: Secondary | ICD-10-CM | POA: Diagnosis not present

## 2023-04-03 DIAGNOSIS — F0390 Unspecified dementia without behavioral disturbance: Secondary | ICD-10-CM | POA: Diagnosis not present

## 2023-04-03 DIAGNOSIS — R490 Dysphonia: Secondary | ICD-10-CM | POA: Diagnosis not present

## 2023-04-03 DIAGNOSIS — R4701 Aphasia: Secondary | ICD-10-CM | POA: Diagnosis not present

## 2023-04-03 DIAGNOSIS — G20A1 Parkinson's disease without dyskinesia, without mention of fluctuations: Secondary | ICD-10-CM | POA: Diagnosis not present

## 2023-04-03 DIAGNOSIS — E538 Deficiency of other specified B group vitamins: Secondary | ICD-10-CM | POA: Diagnosis not present

## 2023-04-03 DIAGNOSIS — J309 Allergic rhinitis, unspecified: Secondary | ICD-10-CM | POA: Diagnosis not present

## 2023-04-03 DIAGNOSIS — E785 Hyperlipidemia, unspecified: Secondary | ICD-10-CM | POA: Diagnosis not present

## 2023-04-03 DIAGNOSIS — R41841 Cognitive communication deficit: Secondary | ICD-10-CM | POA: Diagnosis not present

## 2023-04-03 DIAGNOSIS — R2689 Other abnormalities of gait and mobility: Secondary | ICD-10-CM | POA: Diagnosis not present

## 2023-04-03 DIAGNOSIS — F32A Depression, unspecified: Secondary | ICD-10-CM | POA: Diagnosis not present

## 2023-04-03 DIAGNOSIS — E559 Vitamin D deficiency, unspecified: Secondary | ICD-10-CM | POA: Diagnosis not present

## 2023-04-03 DIAGNOSIS — H409 Unspecified glaucoma: Secondary | ICD-10-CM | POA: Diagnosis not present

## 2023-04-03 DIAGNOSIS — I951 Orthostatic hypotension: Secondary | ICD-10-CM | POA: Diagnosis not present

## 2023-04-04 DIAGNOSIS — R490 Dysphonia: Secondary | ICD-10-CM | POA: Diagnosis not present

## 2023-04-04 DIAGNOSIS — M6281 Muscle weakness (generalized): Secondary | ICD-10-CM | POA: Diagnosis not present

## 2023-04-04 DIAGNOSIS — R4701 Aphasia: Secondary | ICD-10-CM | POA: Diagnosis not present

## 2023-04-04 DIAGNOSIS — R41841 Cognitive communication deficit: Secondary | ICD-10-CM | POA: Diagnosis not present

## 2023-04-05 DIAGNOSIS — R4701 Aphasia: Secondary | ICD-10-CM | POA: Diagnosis not present

## 2023-04-05 DIAGNOSIS — R490 Dysphonia: Secondary | ICD-10-CM | POA: Diagnosis not present

## 2023-04-05 DIAGNOSIS — R41841 Cognitive communication deficit: Secondary | ICD-10-CM | POA: Diagnosis not present

## 2023-04-06 DIAGNOSIS — R2689 Other abnormalities of gait and mobility: Secondary | ICD-10-CM | POA: Diagnosis not present

## 2023-04-06 DIAGNOSIS — R490 Dysphonia: Secondary | ICD-10-CM | POA: Diagnosis not present

## 2023-04-06 DIAGNOSIS — R41841 Cognitive communication deficit: Secondary | ICD-10-CM | POA: Diagnosis not present

## 2023-04-06 DIAGNOSIS — R4701 Aphasia: Secondary | ICD-10-CM | POA: Diagnosis not present

## 2023-04-08 ENCOUNTER — Emergency Department (HOSPITAL_COMMUNITY)
Admission: EM | Admit: 2023-04-08 | Discharge: 2023-04-08 | Disposition: A | Discharge status: Home / Self Care | Payer: Medicare Other | Attending: Student | Admitting: Student

## 2023-04-08 ENCOUNTER — Encounter (HOSPITAL_COMMUNITY): Payer: Self-pay

## 2023-04-08 ENCOUNTER — Emergency Department (HOSPITAL_COMMUNITY): Payer: Medicare Other

## 2023-04-08 ENCOUNTER — Other Ambulatory Visit: Payer: Self-pay

## 2023-04-08 DIAGNOSIS — G20C Parkinsonism, unspecified: Secondary | ICD-10-CM | POA: Diagnosis not present

## 2023-04-08 DIAGNOSIS — N3001 Acute cystitis with hematuria: Secondary | ICD-10-CM | POA: Diagnosis not present

## 2023-04-08 DIAGNOSIS — D72829 Elevated white blood cell count, unspecified: Secondary | ICD-10-CM | POA: Diagnosis not present

## 2023-04-08 DIAGNOSIS — M47812 Spondylosis without myelopathy or radiculopathy, cervical region: Secondary | ICD-10-CM | POA: Diagnosis not present

## 2023-04-08 DIAGNOSIS — S51012A Laceration without foreign body of left elbow, initial encounter: Secondary | ICD-10-CM | POA: Insufficient documentation

## 2023-04-08 DIAGNOSIS — W19XXXA Unspecified fall, initial encounter: Secondary | ICD-10-CM | POA: Diagnosis not present

## 2023-04-08 DIAGNOSIS — I10 Essential (primary) hypertension: Secondary | ICD-10-CM | POA: Insufficient documentation

## 2023-04-08 DIAGNOSIS — R609 Edema, unspecified: Secondary | ICD-10-CM | POA: Diagnosis not present

## 2023-04-08 DIAGNOSIS — W010XXA Fall on same level from slipping, tripping and stumbling without subsequent striking against object, initial encounter: Secondary | ICD-10-CM | POA: Insufficient documentation

## 2023-04-08 DIAGNOSIS — S0990XA Unspecified injury of head, initial encounter: Secondary | ICD-10-CM | POA: Diagnosis not present

## 2023-04-08 DIAGNOSIS — Z7982 Long term (current) use of aspirin: Secondary | ICD-10-CM | POA: Diagnosis not present

## 2023-04-08 DIAGNOSIS — Z79899 Other long term (current) drug therapy: Secondary | ICD-10-CM | POA: Insufficient documentation

## 2023-04-08 DIAGNOSIS — S299XXA Unspecified injury of thorax, initial encounter: Secondary | ICD-10-CM | POA: Diagnosis not present

## 2023-04-08 DIAGNOSIS — M19022 Primary osteoarthritis, left elbow: Secondary | ICD-10-CM | POA: Diagnosis not present

## 2023-04-08 DIAGNOSIS — M25522 Pain in left elbow: Secondary | ICD-10-CM | POA: Diagnosis not present

## 2023-04-08 DIAGNOSIS — S199XXA Unspecified injury of neck, initial encounter: Secondary | ICD-10-CM | POA: Diagnosis not present

## 2023-04-08 LAB — URINALYSIS, ROUTINE W REFLEX MICROSCOPIC
Bilirubin Urine: NEGATIVE
Glucose, UA: NEGATIVE mg/dL
Hgb urine dipstick: NEGATIVE
Ketones, ur: 5 mg/dL — AB
Nitrite: NEGATIVE
Protein, ur: 30 mg/dL — AB
Specific Gravity, Urine: 1.018 (ref 1.005–1.030)
WBC, UA: >50 WBC/hpf (ref 0–5)
pH: 7 (ref 5.0–8.0)

## 2023-04-08 LAB — CBC WITH DIFFERENTIAL/PLATELET
Abs Immature Granulocytes: 0.2 10*3/uL — ABNORMAL HIGH (ref 0.00–0.07)
Basophils Absolute: 0 10*3/uL (ref 0.0–0.1)
Basophils Relative: 0 %
Eosinophils Absolute: 0.1 10*3/uL (ref 0.0–0.5)
Eosinophils Relative: 1 %
HCT: 38.7 % — ABNORMAL LOW (ref 39.0–52.0)
Hemoglobin: 13 g/dL (ref 13.0–17.0)
Immature Granulocytes: 1 %
Lymphocytes Relative: 3 %
Lymphs Abs: 0.7 10*3/uL (ref 0.7–4.0)
MCH: 31.3 pg (ref 26.0–34.0)
MCHC: 33.6 g/dL (ref 30.0–36.0)
MCV: 93 fL (ref 80.0–100.0)
Monocytes Absolute: 2.1 10*3/uL — ABNORMAL HIGH (ref 0.1–1.0)
Monocytes Relative: 8 %
Neutro Abs: 21.7 10*3/uL — ABNORMAL HIGH (ref 1.7–7.7)
Neutrophils Relative %: 87 %
Platelets: 148 10*3/uL — ABNORMAL LOW (ref 150–400)
RBC: 4.16 MIL/uL — ABNORMAL LOW (ref 4.22–5.81)
RDW: 13.1 % (ref 11.5–15.5)
WBC: 24.8 10*3/uL — ABNORMAL HIGH (ref 4.0–10.5)
nRBC: 0 % (ref 0.0–0.2)

## 2023-04-08 LAB — COMPREHENSIVE METABOLIC PANEL
ALT: 28 U/L (ref 0–44)
AST: 87 U/L — ABNORMAL HIGH (ref 15–41)
Albumin: 4.1 g/dL (ref 3.5–5.0)
Alkaline Phosphatase: 101 U/L (ref 38–126)
Anion gap: 7 (ref 5–15)
BUN: 25 mg/dL — ABNORMAL HIGH (ref 8–23)
CO2: 26 mmol/L (ref 22–32)
Calcium: 9.7 mg/dL (ref 8.9–10.3)
Chloride: 102 mmol/L (ref 98–111)
Creatinine, Ser: 0.84 mg/dL (ref 0.61–1.24)
GFR, Estimated: >60 mL/min (ref >60)
Glucose, Bld: 165 mg/dL — ABNORMAL HIGH (ref 70–99)
Potassium: 4.1 mmol/L (ref 3.5–5.1)
Sodium: 135 mmol/L (ref 135–145)
Total Bilirubin: 1.8 mg/dL — ABNORMAL HIGH (ref 0.3–1.2)
Total Protein: 6.9 g/dL (ref 6.5–8.1)

## 2023-04-08 MED ORDER — SODIUM CHLORIDE 0.9 % IV SOLN
2.0000 g | Freq: Once | INTRAVENOUS | Status: AC
  Administered 2023-04-08: 2 g via INTRAVENOUS
  Filled 2023-04-08: qty 20

## 2023-04-08 MED ORDER — CEFADROXIL 500 MG PO CAPS: 500.0000 mg | ORAL_CAPSULE | Freq: Two times a day (BID) | ORAL | 0 refills | Status: AC

## 2023-04-08 NOTE — ED Triage Notes (Addendum)
Pt presents via EMS c/o fall at [facility]. Reports hit head, denies LOC. Reports no blood thinners. Pt A&O x4 per EMS and at baseline. Skin tear noted to left elbow per EMS. Pt reports "tripped over heavy door".

## 2023-04-08 NOTE — ED Notes (Signed)
D/c with daughter to transport back to facility.

## 2023-04-08 NOTE — Discharge Instructions (Signed)
  cefadroxil (DURICEF) 500 MG capsule Take 1 capsule (500 mg total) by mouth 2 (two) times daily for 7 day

## 2023-04-08 NOTE — ED Notes (Signed)
Patient transported to CT 

## 2023-04-08 NOTE — ED Notes (Signed)
Xray completed at bedside

## 2023-04-08 NOTE — ED Notes (Signed)
Pt depends changed per request.

## 2023-04-08 NOTE — ED Provider Notes (Signed)
[place] EMERGENCY DEPARTMENT AT [facility] Provider Note  CSN: [id number] Arrival date & time: 04/08/23 1903  Chief Complaint(s) Fall  HPI Seth Carter is a 82 y.o. male with PMH BPH, Parkinson's, GERD, glaucoma who presents emergency room for evaluation of a fall and dysuria.  Patient states that he had a fall at a skilled nursing facility where he tripped over a heavy door.  No syncope reported.  Also endorses some mild suprapubic abdominal pain and on arrival there is a strong smell of urine in the room.  No blood thinner use or loss of consciousness.  Patient arrives with a skin tear over the left elbow but patient currently denies chest pain, shortness of breath, abdominal pain, nausea, vomiting, numbness, tingling, weakness or other systemic, neurologic or traumatic complaints.   Past Medical History Past Medical History:  Diagnosis Date   Acute pharyngitis    AKI (acute kidney injury) 09/29/2020   Allergic rhinitis    Benign prostatic hyperplasia with lower urinary tract symptoms 09/23/2020   Bradykinesia    Cervical spondylosis without myelopathy 12/18/2019   Chronic back pain    Chronic sinusitis    Degeneration of lumbar intervertebral disc 08/20/2020   ED (erectile dysfunction)    Enlarged prostate 12/15/2020   Essential hypertension    nuclear test in epic 01-16-2020 normal perfusion no ischemia, normal lvf and wall motion, nuclear ef 65%   Family history of hemochromatosis    Fatigue    Gastroesophageal reflux disease without esophagitis 12/18/2019   Generalized weakness    Glaucoma, both eyes    Hardening of the aorta (main artery of the heart)    Hemorrhoids    History of syncope    cardiology evaluation by [name], note in epic 12-26-2019, work-up includes event monitor 02-04-2020( showed SR with occ PAC & PVC rare PAT)/ echo  12-26-2019 (mild LVH, ef 50-55%, moderate MR, mild to moderate AV sclerosis no stenosis, ascending aorta 39mm) and nuclear test  01-16-2020 (normal perfusion w/ normal LV and wall motion, nuclear ef 65%)   Hyperglycemia 12/18/2019   Hypogonadism in male    Leukocytosis 09/29/2020   Macular degeneration, right eye    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   Mixed hyperlipidemia    Onychomycosis 12/18/2019   Osteoarthritis of knee 12/18/2019   Pain in hand 12/18/2019   Parkinson's disease 08/2020   Renal stone 12/18/2019   Sebaceous cyst 12/18/2019   Seizure-like activity 09/29/2020   Spinal stenosis of lumbar region 08/20/2020   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/2019   Toxic effect of venom of wasps 12/18/2019   Vitamin B12 deficiency    Vitamin D deficiency    Patient Active Problem List   Diagnosis Date Noted   Stroke-like symptoms 12/30/2022   Orthostatic hypotension 12/30/2022   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   Allergic rhinitis    Chronic sinusitis    Enlarged prostate    Hardening of the aorta (main artery of the heart)    Vitamin B12 deficiency    Seizure-like activity 09/29/2020   Parkinson's disease 09/29/2020   Leukocytosis 09/29/2020   Benign prostatic hyperplasia with lower urinary tract symptoms 09/23/2020   Degeneration of lumbar intervertebral disc 08/20/2020   Spinal stenosis of lumbar region 08/20/2020   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/2019   Hemorrhoids 12/18/2019   Vitamin D deficiency 12/18/2019   Male erectile dysfunction 12/18/2019   Osteoarthritis of knee 12/18/2019   Impaired fasting glucose 12/18/2019  Gastroesophageal reflux disease without esophagitis 12/18/2019   Essential hypertension 12/18/2019   Mixed hyperlipidemia 12/18/2019   Fatigue 12/18/2019   Pain in hand 12/18/2019   Onychomycosis 12/18/2019   Cervical spondylosis without myelopathy 12/18/2019   Toxic effect of venom of wasps 12/18/2019   Right flank pain 12/18/2019   Family history of hemochromatosis 12/18/2019   Pure hypercholesterolemia 12/18/2019    Hyperglycemia 12/18/2019   Shoulder joint pain 01/29/2019   Home Medication(s) Prior to Admission medications   Medication Sig Start Date End Date Taking? Authorizing Provider  cefadroxil (DURICEF) 500 MG capsule Take 1 capsule (500 mg total) by mouth 2 (two) times daily for 7 days. 04/08/23 04/15/23 Yes Daran Favaro, MD  aspirin EC 81 MG tablet Take 81 mg by mouth at bedtime.    [provider]  carbidopa-levodopa (SINEMET IR) 25-100 MG tablet Take 2 tablets by mouth 3 (three) times daily.    [provider]  cetirizine (ZYRTEC) 10 MG tablet Take 10 mg by mouth daily.    [provider]  Cyanocobalamin (VITAMIN B-12 PO) Take 1 tablet by mouth daily with lunch.    [provider]  dorzolamide-timolol (COSOPT) 22.3-6.8 MG/ML ophthalmic solution Place 1 drop into both eyes 2 (two) times daily.    [provider]  fludrocortisone (FLORINEF) 0.1 MG tablet Take 1 tablet (0.1 mg total) by mouth every other day. 01/01/23   [name], MD  latanoprost (XALATAN) 0.005 % ophthalmic solution Place 1 drop into both eyes at bedtime.    [provider]  MAGNESIUM OXIDE PO Take 1 tablet by mouth at bedtime.    [provider]  Multiple Vitamin (MULTIVITAMIN WITH MINERALS) TABS tablet Take 1 tablet by mouth daily.    [provider]  Multiple Vitamins-Minerals (PRESERVISION/LUTEIN) CAPS Take 1 capsule by mouth daily with lunch.    [provider]  rosuvastatin (CRESTOR) 10 MG tablet Take 10 mg by mouth at bedtime.    [provider]                                                                                                                                    Past Surgical History Past Surgical History:  Procedure Laterality Date   ANKLE SURGERY Right 10-04-2015  @[facility]    debridement achilles tendon & reattachment and heel excision osteophyte   CATARACT EXTRACTION W/ INTRAOCULAR LENS  IMPLANT, BILATERAL  yrs ago    CYSTOSCOPY WITH INSERTION OF UROLIFT  2019 approx.   SHOULDER SURGERY Right 1990's   TONSILLECTOMY  age 33   TRANSURETHRAL RESECTION OF PROSTATE N/A 09/23/2020   Procedure: TRANSURETHRAL RESECTION OF THE PROSTATE (TURP);  Surgeon: [name], MD;  Location: [facility];  Service: Urology;  Laterality: N/A;  17 MINS   Family History Family History  Problem Relation Age of Onset   Cancer Mother    Heart attack Father    Melanoma  Father    Alzheimer's disease Sister    Heart disease Sister    Other Sister        Polio   Thyroid disease Daughter    Hemachromatosis Daughter    High blood pressure Daughter    Diabetes Daughter     Social History Social History   Tobacco Use   Smoking status: Never   Smokeless tobacco: Never  Vaping Use   Vaping status: Never Used  Substance Use Topics   Alcohol use: Yes    Alcohol/week: 5.0 - 6.0 standard drinks of alcohol    Types: 5 - 6 Standard drinks or equivalent per week   Drug use: Never   Allergies Cialis [tadalafil], Pneumovax [pneumococcal polysaccharide vaccine], Zocor [simvastatin], Ace inhibitors, and Contrast media [iodinated contrast media]  Review of Systems Review of Systems  Genitourinary:  Positive for dysuria.  Skin:  Positive for wound.    Physical Exam Vital Signs  I have reviewed the triage vital signs BP (!) 142/70   Pulse 68   Temp 98 F (36.7 C) (Oral)   Resp 16   Ht 5\' 11"  (1.803 m)   Wt 79.4 kg   SpO2 97%   BMI 24.41 kg/m   Physical Exam Constitutional:      General: He is not in acute distress.    Appearance: Normal appearance.  HENT:     Head: Normocephalic and atraumatic.     Nose: No congestion or rhinorrhea.  Eyes:     General:        Right eye: No discharge.        Left eye: No discharge.     Extraocular Movements: Extraocular movements intact.     Pupils: Pupils are equal, round, and reactive to light.  Cardiovascular:     Rate and Rhythm: Normal rate and  regular rhythm.     Heart sounds: No murmur heard. Pulmonary:     Effort: No respiratory distress.     Breath sounds: No wheezing or rales.  Abdominal:     General: There is no distension.     Tenderness: There is abdominal tenderness.  Musculoskeletal:        General: Normal range of motion.     Cervical back: Normal range of motion.  Skin:    General: Skin is warm and dry.     Findings: Lesion present.  Neurological:     General: No focal deficit present.     Mental Status: He is alert.     ED Results and Treatments Labs (all labs ordered are listed, but only abnormal results are displayed) Labs Reviewed  URINALYSIS, ROUTINE W REFLEX MICROSCOPIC - Abnormal; Notable for the following components:      Result Value   Color, Urine AMBER (*)    APPearance CLOUDY (*)    Ketones, ur 5 (*)    Protein, ur 30 (*)    Leukocytes,Ua MODERATE (*)    Bacteria, UA MANY (*)    All other components within normal limits  COMPREHENSIVE METABOLIC PANEL - Abnormal; Notable for the following components:   Glucose, Bld 165 (*)    BUN 25 (*)    AST 87 (*)    Total Bilirubin 1.8 (*)    All other components within normal limits  CBC WITH DIFFERENTIAL/PLATELET - Abnormal; Notable for the following components:   WBC 24.8 (*)    RBC 4.16 (*)    HCT 38.7 (*)    Platelets 148 (*)  Neutro Abs 21.7 (*)    Monocytes Absolute 2.1 (*)    Abs Immature Granulocytes 0.20 (*)    All other components within normal limits                                                                                                                          Radiology CT CERVICAL SPINE WO CONTRAST  Result Date: 04/08/2023 CLINICAL DATA:  Tripped and fell EXAM: CT CERVICAL SPINE WITHOUT CONTRAST TECHNIQUE: Multidetector CT imaging of the cervical spine was performed without intravenous contrast. Multiplanar CT image reconstructions were also generated. RADIATION DOSE REDUCTION: This exam was performed according to the  departmental dose-optimization program which includes automated exposure control, adjustment of the mA and/or kV according to patient size and/or use of iterative reconstruction technique. COMPARISON:  None Available. FINDINGS: Alignment: Alignment is anatomic. Skull base and vertebrae: No acute fracture. No primary bone lesion or focal pathologic process. Soft tissues and spinal canal: No prevertebral fluid or swelling. No visible canal hematoma. Disc levels: Moderate spondylosis at C5-6 and C6-7. Multilevel facet hypertrophy greatest at C2-3 and C3-4. Upper chest: Airway is patent. Visualized portions of the lung apices are clear. Other: Reconstructed images demonstrate no additional findings. IMPRESSION: 1. No acute cervical spine fracture. 2. Multilevel cervical degenerative changes as above. Electronically Signed   By: Sharlet Salina M.D.   On: 04/08/2023 21:09   CT HEAD WO CONTRAST ( )  Result Date: 04/08/2023 CLINICAL DATA:  Tripped and fell EXAM: CT HEAD WITHOUT CONTRAST TECHNIQUE: Contiguous axial images were obtained from the base of the skull through the vertex without intravenous contrast. RADIATION DOSE REDUCTION: This exam was performed according to the departmental dose-optimization program which includes automated exposure control, adjustment of the mA and/or kV according to patient size and/or use of iterative reconstruction technique. COMPARISON:  01/11/2023 FINDINGS: Brain: Stable hypodensities within the periventricular white matter and bilateral basal ganglia consistent with chronic small vessel ischemic changes. No evidence of acute infarct or hemorrhage. Lateral ventricles and midline structures are unremarkable. No acute extra-axial fluid collections. No mass effect. Vascular: No hyperdense vessel or unexpected calcification. Skull: Normal. Negative for fracture or focal lesion. Sinuses/Orbits: Left mastoid effusion. Minimal mucosal thickening within the posterior ethmoid air cells.  Remaining paranasal sinuses are clear. Other: None. IMPRESSION: 1. No acute intracranial process. Electronically Signed   By: Sharlet Salina M.D.   On: 04/08/2023 21:00   DG Elbow Complete Left  Result Date: 04/08/2023 CLINICAL DATA:  Fall, left elbow pain. EXAM: LEFT ELBOW - COMPLETE 3+ VIEW COMPARISON:  None Available. FINDINGS: There is no evidence of fracture, dislocation, or joint effusion. Degenerative changes are noted at the elbow. Soft tissues are unremarkable. IMPRESSION: No acute fracture or dislocation. Electronically Signed   By: Thornell Sartorius M.D.   On: 04/08/2023 20:07   DG Chest 1 View  Result Date: 04/08/2023 CLINICAL DATA:  Tripped and fell 2 hours ago EXAM: CHEST  1 VIEW COMPARISON:  01/11/2023  FINDINGS: The heart size and mediastinal contours are within normal limits. Both lungs are clear. The visualized skeletal structures are unremarkable. IMPRESSION: No active disease. Electronically Signed   By: Sharlet Salina M.D.   On: 04/08/2023 20:07    Pertinent labs & imaging results that were available during my care of the patient were reviewed by me and considered in my medical decision making (see MDM for details).  Medications Ordered in ED Medications  cefTRIAXone (ROCEPHIN) 2 g in sodium chloride 0.9 % 100 mL IVPB (0 g Intravenous Stopped 04/08/23 2202)                                                                                                                                     Procedures Procedures  (including critical care time)  Medical Decision Making / ED Course   This patient presents to the ED for concern of fall, dysuria, this involves an extensive number of treatment options, and is a complaint that carries with it a high risk of complications and morbidity.  The differential diagnosis includes closed head injury, hematoma, contusion, fracture, UTI, urinary retention, interstitial cystitis, ICH  MDM: Patient seen emergency room for evaluation of a fall,  dysuria.  Physical exam with a skin tear over the left elbow, mild suprapubic abdominal pain but is otherwise unremarkable.  No additional external signs of trauma on the chest abdomen or pelvis.  Trauma imaging including CT head and C-spine, chest x-ray and elbow x-ray reassuringly negative for acute traumatic injury.  Laboratory evaluation with a significant leukocytosis to 24.8, urinalysis concerning for infection.  Patient given ceftriaxone for antibiotic coverage and will be given Duricef at discharge.  Skin tear addressed with local wound care but at this time he does not meet inpatient criteria for admission and he is safe for discharge with outpatient follow-up.   Additional history obtained: -Additional history obtained from multiple family members -External records from outside source obtained and reviewed including: Chart review including previous notes, labs, imaging, consultation notes   Lab Tests: -I ordered, reviewed, and interpreted labs.   The pertinent results include:   Labs Reviewed  URINALYSIS, ROUTINE W REFLEX MICROSCOPIC - Abnormal; Notable for the following components:      Result Value   Color, Urine AMBER (*)    APPearance CLOUDY (*)    Ketones, ur 5 (*)    Protein, ur 30 (*)    Leukocytes,Ua MODERATE (*)    Bacteria, UA MANY (*)    All other components within normal limits  COMPREHENSIVE METABOLIC PANEL - Abnormal; Notable for the following components:   Glucose, Bld 165 (*)    BUN 25 (*)    AST 87 (*)    Total Bilirubin 1.8 (*)    All other components within normal limits  CBC WITH DIFFERENTIAL/PLATELET - Abnormal; Notable for the following components:   WBC 24.8 (*)    RBC 4.16 (*)  HCT 38.7 (*)    Platelets 148 (*)    Neutro Abs 21.7 (*)    Monocytes Absolute 2.1 (*)    Abs Immature Granulocytes 0.20 (*)    All other components within normal limits      Imaging Studies ordered: I ordered imaging studies including CT head, C-spine, elbow x-ray,  chest x-ray I independently visualized and interpreted imaging. I agree with the radiologist interpretation   Medicines ordered and prescription drug management: Meds ordered this encounter  Medications   cefTRIAXone (ROCEPHIN) 2 g in sodium chloride 0.9 % 100 mL IVPB    Order Specific Question:   Antibiotic Indication:    Answer:   UTI   cefadroxil (DURICEF) 500 MG capsule    Sig: Take 1 capsule (500 mg total) by mouth 2 (two) times daily for 7 days.    Dispense:  14 capsule    Refill:  0    -I have reviewed the patients home medicines and have made adjustments as needed  Critical interventions none   Cardiac Monitoring: The patient was maintained on a cardiac monitor.  I personally viewed and interpreted the cardiac monitored which showed an underlying rhythm of: NSR  Social Determinants of Health:  Factors impacting patients care include: Lives in a skilled nursing facility   Reevaluation: After the interventions noted above, I reevaluated the patient and found that they have :improved  Co morbidities that complicate the patient evaluation  Past Medical History:  Diagnosis Date   Acute pharyngitis    AKI (acute kidney injury) 09/29/2020   Allergic rhinitis    Benign prostatic hyperplasia with lower urinary tract symptoms 09/23/2020   Bradykinesia    Cervical spondylosis without myelopathy 12/18/2019   Chronic back pain    Chronic sinusitis    Degeneration of lumbar intervertebral disc 08/20/2020   ED (erectile dysfunction)    Enlarged prostate 12/15/2020   Essential hypertension    nuclear test in epic 01-16-2020 normal perfusion no ischemia, normal lvf and wall motion, nuclear ef 65%   Family history of hemochromatosis    Fatigue    Gastroesophageal reflux disease without esophagitis 12/18/2019   Generalized weakness    Glaucoma, both eyes    Hardening of the aorta (main artery of the heart)    Hemorrhoids    History of syncope    cardiology evaluation by dr  Anne Fu, note in epic 12-26-2019, work-up includes event monitor 02-04-2020( showed SR with occ PAC & PVC rare PAT)/ echo  12-26-2019 (mild LVH, ef 50-55%, moderate MR, mild to moderate AV sclerosis no stenosis, ascending aorta 39mm) and nuclear test 01-16-2020 (normal perfusion w/ normal LV and wall motion, nuclear ef 65%)   Hyperglycemia 12/18/2019   Hypogonadism in male    Leukocytosis 09/29/2020   Macular degeneration, right eye    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   Mixed hyperlipidemia    Onychomycosis 12/18/2019   Osteoarthritis of knee 12/18/2019   Pain in hand 12/18/2019   Parkinson's disease 08/2020   Renal stone 12/18/2019   Sebaceous cyst 12/18/2019   Seizure-like activity 09/29/2020   Spinal stenosis of lumbar region 08/20/2020   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/2019   Toxic effect of venom of wasps 12/18/2019   Vitamin B12 deficiency    Vitamin D deficiency       Dispostion: I considered admission for this patient, but at this time he does not meet inpatient criteria for admission he is safe for discharge with outpatient follow-up  Final Clinical Impression(s) / ED Diagnoses Final diagnoses:  Fall, initial encounter  Skin tear of left elbow without complication, initial encounter  Acute cystitis with hematuria     @PCDICTATION @    Glendora Score, MD 04/08/23 2250

## 2023-04-09 DIAGNOSIS — R2689 Other abnormalities of gait and mobility: Secondary | ICD-10-CM | POA: Diagnosis not present

## 2023-04-10 DIAGNOSIS — R296 Repeated falls: Secondary | ICD-10-CM | POA: Diagnosis not present

## 2023-04-10 DIAGNOSIS — G20A1 Parkinson's disease without dyskinesia, without mention of fluctuations: Secondary | ICD-10-CM | POA: Diagnosis not present

## 2023-04-10 DIAGNOSIS — F0390 Unspecified dementia without behavioral disturbance: Secondary | ICD-10-CM | POA: Diagnosis not present

## 2023-04-12 DIAGNOSIS — E291 Testicular hypofunction: Secondary | ICD-10-CM | POA: Diagnosis not present

## 2023-04-12 DIAGNOSIS — M170 Bilateral primary osteoarthritis of knee: Secondary | ICD-10-CM | POA: Diagnosis not present

## 2023-04-12 DIAGNOSIS — K219 Gastro-esophageal reflux disease without esophagitis: Secondary | ICD-10-CM | POA: Diagnosis not present

## 2023-04-12 DIAGNOSIS — M47812 Spondylosis without myelopathy or radiculopathy, cervical region: Secondary | ICD-10-CM | POA: Diagnosis not present

## 2023-04-12 DIAGNOSIS — N400 Enlarged prostate without lower urinary tract symptoms: Secondary | ICD-10-CM | POA: Diagnosis not present

## 2023-04-12 DIAGNOSIS — H6992 Unspecified Eustachian tube disorder, left ear: Secondary | ICD-10-CM | POA: Diagnosis not present

## 2023-04-12 DIAGNOSIS — G20A1 Parkinson's disease without dyskinesia, without mention of fluctuations: Secondary | ICD-10-CM | POA: Diagnosis not present

## 2023-04-12 DIAGNOSIS — N390 Urinary tract infection, site not specified: Secondary | ICD-10-CM | POA: Diagnosis not present

## 2023-04-12 DIAGNOSIS — Z9181 History of falling: Secondary | ICD-10-CM | POA: Diagnosis not present

## 2023-04-12 DIAGNOSIS — M19012 Primary osteoarthritis, left shoulder: Secondary | ICD-10-CM | POA: Diagnosis not present

## 2023-04-12 DIAGNOSIS — J309 Allergic rhinitis, unspecified: Secondary | ICD-10-CM | POA: Diagnosis not present

## 2023-04-12 DIAGNOSIS — S51812D Laceration without foreign body of left forearm, subsequent encounter: Secondary | ICD-10-CM | POA: Diagnosis not present

## 2023-04-12 DIAGNOSIS — H3320 Serous retinal detachment, unspecified eye: Secondary | ICD-10-CM | POA: Diagnosis not present

## 2023-04-12 DIAGNOSIS — S41112D Laceration without foreign body of left upper arm, subsequent encounter: Secondary | ICD-10-CM | POA: Diagnosis not present

## 2023-04-12 DIAGNOSIS — F0393 Unspecified dementia, unspecified severity, with mood disturbance: Secondary | ICD-10-CM | POA: Diagnosis not present

## 2023-04-12 DIAGNOSIS — M19011 Primary osteoarthritis, right shoulder: Secondary | ICD-10-CM | POA: Diagnosis not present

## 2023-04-12 DIAGNOSIS — B351 Tinea unguium: Secondary | ICD-10-CM | POA: Diagnosis not present

## 2023-04-12 DIAGNOSIS — Z8673 Personal history of transient ischemic attack (TIA), and cerebral infarction without residual deficits: Secondary | ICD-10-CM | POA: Diagnosis not present

## 2023-04-12 DIAGNOSIS — E7800 Pure hypercholesterolemia, unspecified: Secondary | ICD-10-CM | POA: Diagnosis not present

## 2023-04-12 DIAGNOSIS — J40 Bronchitis, not specified as acute or chronic: Secondary | ICD-10-CM | POA: Diagnosis not present

## 2023-04-12 DIAGNOSIS — H409 Unspecified glaucoma: Secondary | ICD-10-CM | POA: Diagnosis not present

## 2023-04-12 DIAGNOSIS — N529 Male erectile dysfunction, unspecified: Secondary | ICD-10-CM | POA: Diagnosis not present

## 2023-04-12 DIAGNOSIS — I951 Orthostatic hypotension: Secondary | ICD-10-CM | POA: Diagnosis not present

## 2023-04-12 DIAGNOSIS — F32A Depression, unspecified: Secondary | ICD-10-CM | POA: Diagnosis not present

## 2023-04-12 DIAGNOSIS — J9801 Acute bronchospasm: Secondary | ICD-10-CM | POA: Diagnosis not present

## 2023-04-17 DIAGNOSIS — Z23 Encounter for immunization: Secondary | ICD-10-CM | POA: Diagnosis not present

## 2023-04-17 DIAGNOSIS — F32A Depression, unspecified: Secondary | ICD-10-CM | POA: Diagnosis not present

## 2023-04-17 DIAGNOSIS — F413 Other mixed anxiety disorders: Secondary | ICD-10-CM | POA: Diagnosis not present

## 2023-04-17 DIAGNOSIS — F4320 Adjustment disorder, unspecified: Secondary | ICD-10-CM | POA: Diagnosis not present

## 2023-04-19 DIAGNOSIS — S51812D Laceration without foreign body of left forearm, subsequent encounter: Secondary | ICD-10-CM | POA: Diagnosis not present

## 2023-04-19 DIAGNOSIS — F32A Depression, unspecified: Secondary | ICD-10-CM | POA: Diagnosis not present

## 2023-04-19 DIAGNOSIS — N390 Urinary tract infection, site not specified: Secondary | ICD-10-CM | POA: Diagnosis not present

## 2023-04-19 DIAGNOSIS — F0393 Unspecified dementia, unspecified severity, with mood disturbance: Secondary | ICD-10-CM | POA: Diagnosis not present

## 2023-04-19 DIAGNOSIS — S41112D Laceration without foreign body of left upper arm, subsequent encounter: Secondary | ICD-10-CM | POA: Diagnosis not present

## 2023-04-19 DIAGNOSIS — G20A1 Parkinson's disease without dyskinesia, without mention of fluctuations: Secondary | ICD-10-CM | POA: Diagnosis not present

## 2023-04-26 DIAGNOSIS — S41112D Laceration without foreign body of left upper arm, subsequent encounter: Secondary | ICD-10-CM | POA: Diagnosis not present

## 2023-04-26 DIAGNOSIS — F0393 Unspecified dementia, unspecified severity, with mood disturbance: Secondary | ICD-10-CM | POA: Diagnosis not present

## 2023-04-26 DIAGNOSIS — G20A1 Parkinson's disease without dyskinesia, without mention of fluctuations: Secondary | ICD-10-CM | POA: Diagnosis not present

## 2023-04-26 DIAGNOSIS — F32A Depression, unspecified: Secondary | ICD-10-CM | POA: Diagnosis not present

## 2023-04-26 DIAGNOSIS — S51812D Laceration without foreign body of left forearm, subsequent encounter: Secondary | ICD-10-CM | POA: Diagnosis not present

## 2023-04-26 DIAGNOSIS — N390 Urinary tract infection, site not specified: Secondary | ICD-10-CM | POA: Diagnosis not present

## 2023-05-01 DIAGNOSIS — N390 Urinary tract infection, site not specified: Secondary | ICD-10-CM | POA: Diagnosis not present

## 2023-05-01 DIAGNOSIS — G20A1 Parkinson's disease without dyskinesia, without mention of fluctuations: Secondary | ICD-10-CM | POA: Diagnosis not present

## 2023-05-01 DIAGNOSIS — F32A Depression, unspecified: Secondary | ICD-10-CM | POA: Diagnosis not present

## 2023-05-01 DIAGNOSIS — S41112D Laceration without foreign body of left upper arm, subsequent encounter: Secondary | ICD-10-CM | POA: Diagnosis not present

## 2023-05-01 DIAGNOSIS — F0393 Unspecified dementia, unspecified severity, with mood disturbance: Secondary | ICD-10-CM | POA: Diagnosis not present

## 2023-05-01 DIAGNOSIS — S51812D Laceration without foreign body of left forearm, subsequent encounter: Secondary | ICD-10-CM | POA: Diagnosis not present

## 2023-05-02 DIAGNOSIS — S51812D Laceration without foreign body of left forearm, subsequent encounter: Secondary | ICD-10-CM | POA: Diagnosis not present

## 2023-05-02 DIAGNOSIS — N390 Urinary tract infection, site not specified: Secondary | ICD-10-CM | POA: Diagnosis not present

## 2023-05-02 DIAGNOSIS — G20A1 Parkinson's disease without dyskinesia, without mention of fluctuations: Secondary | ICD-10-CM | POA: Diagnosis not present

## 2023-05-02 DIAGNOSIS — F0393 Unspecified dementia, unspecified severity, with mood disturbance: Secondary | ICD-10-CM | POA: Diagnosis not present

## 2023-05-02 DIAGNOSIS — F32A Depression, unspecified: Secondary | ICD-10-CM | POA: Diagnosis not present

## 2023-05-02 DIAGNOSIS — S41112D Laceration without foreign body of left upper arm, subsequent encounter: Secondary | ICD-10-CM | POA: Diagnosis not present

## 2023-05-07 DIAGNOSIS — F32A Depression, unspecified: Secondary | ICD-10-CM | POA: Diagnosis not present

## 2023-05-07 DIAGNOSIS — N390 Urinary tract infection, site not specified: Secondary | ICD-10-CM | POA: Diagnosis not present

## 2023-05-07 DIAGNOSIS — F0393 Unspecified dementia, unspecified severity, with mood disturbance: Secondary | ICD-10-CM | POA: Diagnosis not present

## 2023-05-07 DIAGNOSIS — S41112D Laceration without foreign body of left upper arm, subsequent encounter: Secondary | ICD-10-CM | POA: Diagnosis not present

## 2023-05-07 DIAGNOSIS — S51812D Laceration without foreign body of left forearm, subsequent encounter: Secondary | ICD-10-CM | POA: Diagnosis not present

## 2023-05-07 DIAGNOSIS — G20A1 Parkinson's disease without dyskinesia, without mention of fluctuations: Secondary | ICD-10-CM | POA: Diagnosis not present

## 2023-05-09 DIAGNOSIS — N401 Enlarged prostate with lower urinary tract symptoms: Secondary | ICD-10-CM | POA: Diagnosis not present

## 2023-05-09 DIAGNOSIS — F32A Depression, unspecified: Secondary | ICD-10-CM | POA: Diagnosis not present

## 2023-05-09 DIAGNOSIS — F0390 Unspecified dementia without behavioral disturbance: Secondary | ICD-10-CM | POA: Diagnosis not present

## 2023-05-09 DIAGNOSIS — E538 Deficiency of other specified B group vitamins: Secondary | ICD-10-CM | POA: Diagnosis not present

## 2023-05-09 DIAGNOSIS — E559 Vitamin D deficiency, unspecified: Secondary | ICD-10-CM | POA: Diagnosis not present

## 2023-05-09 DIAGNOSIS — J309 Allergic rhinitis, unspecified: Secondary | ICD-10-CM | POA: Diagnosis not present

## 2023-05-09 DIAGNOSIS — G20A1 Parkinson's disease without dyskinesia, without mention of fluctuations: Secondary | ICD-10-CM | POA: Diagnosis not present

## 2023-05-09 DIAGNOSIS — H409 Unspecified glaucoma: Secondary | ICD-10-CM | POA: Diagnosis not present

## 2023-05-09 DIAGNOSIS — R296 Repeated falls: Secondary | ICD-10-CM | POA: Diagnosis not present

## 2023-05-09 DIAGNOSIS — R351 Nocturia: Secondary | ICD-10-CM | POA: Diagnosis not present

## 2023-05-09 DIAGNOSIS — R3915 Urgency of urination: Secondary | ICD-10-CM | POA: Diagnosis not present

## 2023-05-09 DIAGNOSIS — E785 Hyperlipidemia, unspecified: Secondary | ICD-10-CM | POA: Diagnosis not present

## 2023-05-10 DIAGNOSIS — G20A1 Parkinson's disease without dyskinesia, without mention of fluctuations: Secondary | ICD-10-CM | POA: Diagnosis not present

## 2023-05-10 DIAGNOSIS — N390 Urinary tract infection, site not specified: Secondary | ICD-10-CM | POA: Diagnosis not present

## 2023-05-10 DIAGNOSIS — S51812D Laceration without foreign body of left forearm, subsequent encounter: Secondary | ICD-10-CM | POA: Diagnosis not present

## 2023-05-10 DIAGNOSIS — F0393 Unspecified dementia, unspecified severity, with mood disturbance: Secondary | ICD-10-CM | POA: Diagnosis not present

## 2023-05-10 DIAGNOSIS — S41112D Laceration without foreign body of left upper arm, subsequent encounter: Secondary | ICD-10-CM | POA: Diagnosis not present

## 2023-05-10 DIAGNOSIS — F32A Depression, unspecified: Secondary | ICD-10-CM | POA: Diagnosis not present

## 2023-05-12 DIAGNOSIS — H6992 Unspecified Eustachian tube disorder, left ear: Secondary | ICD-10-CM | POA: Diagnosis not present

## 2023-05-12 DIAGNOSIS — F32A Depression, unspecified: Secondary | ICD-10-CM | POA: Diagnosis not present

## 2023-05-12 DIAGNOSIS — M19011 Primary osteoarthritis, right shoulder: Secondary | ICD-10-CM | POA: Diagnosis not present

## 2023-05-12 DIAGNOSIS — I951 Orthostatic hypotension: Secondary | ICD-10-CM | POA: Diagnosis not present

## 2023-05-12 DIAGNOSIS — M170 Bilateral primary osteoarthritis of knee: Secondary | ICD-10-CM | POA: Diagnosis not present

## 2023-05-12 DIAGNOSIS — S51812D Laceration without foreign body of left forearm, subsequent encounter: Secondary | ICD-10-CM | POA: Diagnosis not present

## 2023-05-12 DIAGNOSIS — E291 Testicular hypofunction: Secondary | ICD-10-CM | POA: Diagnosis not present

## 2023-05-12 DIAGNOSIS — E7800 Pure hypercholesterolemia, unspecified: Secondary | ICD-10-CM | POA: Diagnosis not present

## 2023-05-12 DIAGNOSIS — H3320 Serous retinal detachment, unspecified eye: Secondary | ICD-10-CM | POA: Diagnosis not present

## 2023-05-12 DIAGNOSIS — M47812 Spondylosis without myelopathy or radiculopathy, cervical region: Secondary | ICD-10-CM | POA: Diagnosis not present

## 2023-05-12 DIAGNOSIS — J309 Allergic rhinitis, unspecified: Secondary | ICD-10-CM | POA: Diagnosis not present

## 2023-05-12 DIAGNOSIS — Z9181 History of falling: Secondary | ICD-10-CM | POA: Diagnosis not present

## 2023-05-12 DIAGNOSIS — J9801 Acute bronchospasm: Secondary | ICD-10-CM | POA: Diagnosis not present

## 2023-05-12 DIAGNOSIS — N400 Enlarged prostate without lower urinary tract symptoms: Secondary | ICD-10-CM | POA: Diagnosis not present

## 2023-05-12 DIAGNOSIS — K219 Gastro-esophageal reflux disease without esophagitis: Secondary | ICD-10-CM | POA: Diagnosis not present

## 2023-05-12 DIAGNOSIS — N529 Male erectile dysfunction, unspecified: Secondary | ICD-10-CM | POA: Diagnosis not present

## 2023-05-12 DIAGNOSIS — M19012 Primary osteoarthritis, left shoulder: Secondary | ICD-10-CM | POA: Diagnosis not present

## 2023-05-12 DIAGNOSIS — B351 Tinea unguium: Secondary | ICD-10-CM | POA: Diagnosis not present

## 2023-05-12 DIAGNOSIS — S41112D Laceration without foreign body of left upper arm, subsequent encounter: Secondary | ICD-10-CM | POA: Diagnosis not present

## 2023-05-12 DIAGNOSIS — G20A1 Parkinson's disease without dyskinesia, without mention of fluctuations: Secondary | ICD-10-CM | POA: Diagnosis not present

## 2023-05-12 DIAGNOSIS — F0393 Unspecified dementia, unspecified severity, with mood disturbance: Secondary | ICD-10-CM | POA: Diagnosis not present

## 2023-05-12 DIAGNOSIS — H409 Unspecified glaucoma: Secondary | ICD-10-CM | POA: Diagnosis not present

## 2023-05-12 DIAGNOSIS — Z8673 Personal history of transient ischemic attack (TIA), and cerebral infarction without residual deficits: Secondary | ICD-10-CM | POA: Diagnosis not present

## 2023-05-12 DIAGNOSIS — J40 Bronchitis, not specified as acute or chronic: Secondary | ICD-10-CM | POA: Diagnosis not present

## 2023-05-12 DIAGNOSIS — N390 Urinary tract infection, site not specified: Secondary | ICD-10-CM | POA: Diagnosis not present

## 2023-05-14 DIAGNOSIS — G20A1 Parkinson's disease without dyskinesia, without mention of fluctuations: Secondary | ICD-10-CM | POA: Diagnosis not present

## 2023-05-14 DIAGNOSIS — R32 Unspecified urinary incontinence: Secondary | ICD-10-CM | POA: Diagnosis not present

## 2023-05-14 DIAGNOSIS — G903 Multi-system degeneration of the autonomic nervous system: Secondary | ICD-10-CM | POA: Diagnosis not present

## 2023-05-15 DIAGNOSIS — S51812D Laceration without foreign body of left forearm, subsequent encounter: Secondary | ICD-10-CM | POA: Diagnosis not present

## 2023-05-15 DIAGNOSIS — N390 Urinary tract infection, site not specified: Secondary | ICD-10-CM | POA: Diagnosis not present

## 2023-05-15 DIAGNOSIS — F32A Depression, unspecified: Secondary | ICD-10-CM | POA: Diagnosis not present

## 2023-05-15 DIAGNOSIS — F0393 Unspecified dementia, unspecified severity, with mood disturbance: Secondary | ICD-10-CM | POA: Diagnosis not present

## 2023-05-15 DIAGNOSIS — S41112D Laceration without foreign body of left upper arm, subsequent encounter: Secondary | ICD-10-CM | POA: Diagnosis not present

## 2023-05-15 DIAGNOSIS — G20A1 Parkinson's disease without dyskinesia, without mention of fluctuations: Secondary | ICD-10-CM | POA: Diagnosis not present

## 2023-05-16 DIAGNOSIS — F32A Depression, unspecified: Secondary | ICD-10-CM | POA: Diagnosis not present

## 2023-05-16 DIAGNOSIS — N390 Urinary tract infection, site not specified: Secondary | ICD-10-CM | POA: Diagnosis not present

## 2023-05-16 DIAGNOSIS — F0393 Unspecified dementia, unspecified severity, with mood disturbance: Secondary | ICD-10-CM | POA: Diagnosis not present

## 2023-05-16 DIAGNOSIS — S51812D Laceration without foreign body of left forearm, subsequent encounter: Secondary | ICD-10-CM | POA: Diagnosis not present

## 2023-05-16 DIAGNOSIS — G20A1 Parkinson's disease without dyskinesia, without mention of fluctuations: Secondary | ICD-10-CM | POA: Diagnosis not present

## 2023-05-16 DIAGNOSIS — S41112D Laceration without foreign body of left upper arm, subsequent encounter: Secondary | ICD-10-CM | POA: Diagnosis not present

## 2023-05-22 DIAGNOSIS — F0393 Unspecified dementia, unspecified severity, with mood disturbance: Secondary | ICD-10-CM | POA: Diagnosis not present

## 2023-05-22 DIAGNOSIS — S51812D Laceration without foreign body of left forearm, subsequent encounter: Secondary | ICD-10-CM | POA: Diagnosis not present

## 2023-05-22 DIAGNOSIS — G20A1 Parkinson's disease without dyskinesia, without mention of fluctuations: Secondary | ICD-10-CM | POA: Diagnosis not present

## 2023-05-22 DIAGNOSIS — N390 Urinary tract infection, site not specified: Secondary | ICD-10-CM | POA: Diagnosis not present

## 2023-05-22 DIAGNOSIS — S41112D Laceration without foreign body of left upper arm, subsequent encounter: Secondary | ICD-10-CM | POA: Diagnosis not present

## 2023-05-22 DIAGNOSIS — F32A Depression, unspecified: Secondary | ICD-10-CM | POA: Diagnosis not present

## 2023-05-23 DIAGNOSIS — I951 Orthostatic hypotension: Secondary | ICD-10-CM | POA: Diagnosis not present

## 2023-05-23 DIAGNOSIS — E7849 Other hyperlipidemia: Secondary | ICD-10-CM | POA: Diagnosis not present

## 2023-05-23 DIAGNOSIS — G20B2 Parkinson's disease with dyskinesia, with fluctuations: Secondary | ICD-10-CM | POA: Diagnosis not present

## 2023-05-23 DIAGNOSIS — Z79899 Other long term (current) drug therapy: Secondary | ICD-10-CM | POA: Diagnosis not present

## 2023-05-23 DIAGNOSIS — R7303 Prediabetes: Secondary | ICD-10-CM | POA: Diagnosis not present

## 2023-05-23 DIAGNOSIS — Z0000 Encounter for general adult medical examination without abnormal findings: Secondary | ICD-10-CM | POA: Diagnosis not present

## 2023-05-23 DIAGNOSIS — N400 Enlarged prostate without lower urinary tract symptoms: Secondary | ICD-10-CM | POA: Diagnosis not present

## 2023-05-23 DIAGNOSIS — F32A Depression, unspecified: Secondary | ICD-10-CM | POA: Diagnosis not present

## 2023-05-23 DIAGNOSIS — Z8673 Personal history of transient ischemic attack (TIA), and cerebral infarction without residual deficits: Secondary | ICD-10-CM | POA: Diagnosis not present

## 2023-05-29 DIAGNOSIS — S41112D Laceration without foreign body of left upper arm, subsequent encounter: Secondary | ICD-10-CM | POA: Diagnosis not present

## 2023-05-29 DIAGNOSIS — N390 Urinary tract infection, site not specified: Secondary | ICD-10-CM | POA: Diagnosis not present

## 2023-05-29 DIAGNOSIS — S51812D Laceration without foreign body of left forearm, subsequent encounter: Secondary | ICD-10-CM | POA: Diagnosis not present

## 2023-05-29 DIAGNOSIS — F32A Depression, unspecified: Secondary | ICD-10-CM | POA: Diagnosis not present

## 2023-05-29 DIAGNOSIS — G20A1 Parkinson's disease without dyskinesia, without mention of fluctuations: Secondary | ICD-10-CM | POA: Diagnosis not present

## 2023-05-29 DIAGNOSIS — F0393 Unspecified dementia, unspecified severity, with mood disturbance: Secondary | ICD-10-CM | POA: Diagnosis not present

## 2023-06-06 DIAGNOSIS — E785 Hyperlipidemia, unspecified: Secondary | ICD-10-CM | POA: Diagnosis not present

## 2023-06-06 DIAGNOSIS — I951 Orthostatic hypotension: Secondary | ICD-10-CM | POA: Diagnosis not present

## 2023-06-06 DIAGNOSIS — F32A Depression, unspecified: Secondary | ICD-10-CM | POA: Diagnosis not present

## 2023-06-06 DIAGNOSIS — E559 Vitamin D deficiency, unspecified: Secondary | ICD-10-CM | POA: Diagnosis not present

## 2023-06-06 DIAGNOSIS — F0390 Unspecified dementia without behavioral disturbance: Secondary | ICD-10-CM | POA: Diagnosis not present

## 2023-06-06 DIAGNOSIS — E538 Deficiency of other specified B group vitamins: Secondary | ICD-10-CM | POA: Diagnosis not present

## 2023-06-06 DIAGNOSIS — J309 Allergic rhinitis, unspecified: Secondary | ICD-10-CM | POA: Diagnosis not present

## 2023-06-06 DIAGNOSIS — N3281 Overactive bladder: Secondary | ICD-10-CM | POA: Diagnosis not present

## 2023-06-06 DIAGNOSIS — H409 Unspecified glaucoma: Secondary | ICD-10-CM | POA: Diagnosis not present

## 2023-06-13 DIAGNOSIS — R4701 Aphasia: Secondary | ICD-10-CM | POA: Diagnosis not present

## 2023-06-13 DIAGNOSIS — R41841 Cognitive communication deficit: Secondary | ICD-10-CM | POA: Diagnosis not present

## 2023-06-13 DIAGNOSIS — R490 Dysphonia: Secondary | ICD-10-CM | POA: Diagnosis not present

## 2023-06-13 DIAGNOSIS — R1312 Dysphagia, oropharyngeal phase: Secondary | ICD-10-CM | POA: Diagnosis not present

## 2023-06-14 DIAGNOSIS — R2689 Other abnormalities of gait and mobility: Secondary | ICD-10-CM | POA: Diagnosis not present

## 2023-06-14 DIAGNOSIS — R4701 Aphasia: Secondary | ICD-10-CM | POA: Diagnosis not present

## 2023-06-14 DIAGNOSIS — R41841 Cognitive communication deficit: Secondary | ICD-10-CM | POA: Diagnosis not present

## 2023-06-14 DIAGNOSIS — R1312 Dysphagia, oropharyngeal phase: Secondary | ICD-10-CM | POA: Diagnosis not present

## 2023-06-14 DIAGNOSIS — R490 Dysphonia: Secondary | ICD-10-CM | POA: Diagnosis not present

## 2023-06-14 DIAGNOSIS — F32A Depression, unspecified: Secondary | ICD-10-CM | POA: Diagnosis not present

## 2023-06-15 DIAGNOSIS — R4701 Aphasia: Secondary | ICD-10-CM | POA: Diagnosis not present

## 2023-06-15 DIAGNOSIS — R41841 Cognitive communication deficit: Secondary | ICD-10-CM | POA: Diagnosis not present

## 2023-06-15 DIAGNOSIS — R490 Dysphonia: Secondary | ICD-10-CM | POA: Diagnosis not present

## 2023-06-15 DIAGNOSIS — R1312 Dysphagia, oropharyngeal phase: Secondary | ICD-10-CM | POA: Diagnosis not present

## 2023-06-19 DIAGNOSIS — R41841 Cognitive communication deficit: Secondary | ICD-10-CM | POA: Diagnosis not present

## 2023-06-19 DIAGNOSIS — R1312 Dysphagia, oropharyngeal phase: Secondary | ICD-10-CM | POA: Diagnosis not present

## 2023-06-19 DIAGNOSIS — R4701 Aphasia: Secondary | ICD-10-CM | POA: Diagnosis not present

## 2023-06-19 DIAGNOSIS — R2689 Other abnormalities of gait and mobility: Secondary | ICD-10-CM | POA: Diagnosis not present

## 2023-06-19 DIAGNOSIS — R490 Dysphonia: Secondary | ICD-10-CM | POA: Diagnosis not present

## 2023-06-20 DIAGNOSIS — R41841 Cognitive communication deficit: Secondary | ICD-10-CM | POA: Diagnosis not present

## 2023-06-20 DIAGNOSIS — R4701 Aphasia: Secondary | ICD-10-CM | POA: Diagnosis not present

## 2023-06-20 DIAGNOSIS — R490 Dysphonia: Secondary | ICD-10-CM | POA: Diagnosis not present

## 2023-06-20 DIAGNOSIS — R1312 Dysphagia, oropharyngeal phase: Secondary | ICD-10-CM | POA: Diagnosis not present

## 2023-06-20 DIAGNOSIS — R2689 Other abnormalities of gait and mobility: Secondary | ICD-10-CM | POA: Diagnosis not present

## 2023-06-26 DIAGNOSIS — R41841 Cognitive communication deficit: Secondary | ICD-10-CM | POA: Diagnosis not present

## 2023-06-26 DIAGNOSIS — R2681 Unsteadiness on feet: Secondary | ICD-10-CM | POA: Diagnosis not present

## 2023-06-26 DIAGNOSIS — M62551 Muscle wasting and atrophy, not elsewhere classified, right thigh: Secondary | ICD-10-CM | POA: Diagnosis not present

## 2023-06-26 DIAGNOSIS — R4789 Other speech disturbances: Secondary | ICD-10-CM | POA: Diagnosis not present

## 2023-06-26 DIAGNOSIS — R2689 Other abnormalities of gait and mobility: Secondary | ICD-10-CM | POA: Diagnosis not present

## 2023-06-26 DIAGNOSIS — R498 Other voice and resonance disorders: Secondary | ICD-10-CM | POA: Diagnosis not present

## 2023-06-26 DIAGNOSIS — N3946 Mixed incontinence: Secondary | ICD-10-CM | POA: Diagnosis not present

## 2023-06-26 DIAGNOSIS — G20A1 Parkinson's disease without dyskinesia, without mention of fluctuations: Secondary | ICD-10-CM | POA: Diagnosis not present

## 2023-06-26 DIAGNOSIS — R4185 Anosognosia: Secondary | ICD-10-CM | POA: Diagnosis not present

## 2023-06-26 DIAGNOSIS — R488 Other symbolic dysfunctions: Secondary | ICD-10-CM | POA: Diagnosis not present

## 2023-06-26 DIAGNOSIS — M62552 Muscle wasting and atrophy, not elsewhere classified, left thigh: Secondary | ICD-10-CM | POA: Diagnosis not present

## 2023-06-28 DIAGNOSIS — M62551 Muscle wasting and atrophy, not elsewhere classified, right thigh: Secondary | ICD-10-CM | POA: Diagnosis not present

## 2023-06-28 DIAGNOSIS — R2681 Unsteadiness on feet: Secondary | ICD-10-CM | POA: Diagnosis not present

## 2023-06-28 DIAGNOSIS — R4789 Other speech disturbances: Secondary | ICD-10-CM | POA: Diagnosis not present

## 2023-06-28 DIAGNOSIS — N3946 Mixed incontinence: Secondary | ICD-10-CM | POA: Diagnosis not present

## 2023-06-28 DIAGNOSIS — R498 Other voice and resonance disorders: Secondary | ICD-10-CM | POA: Diagnosis not present

## 2023-06-28 DIAGNOSIS — M62552 Muscle wasting and atrophy, not elsewhere classified, left thigh: Secondary | ICD-10-CM | POA: Diagnosis not present

## 2023-06-29 DIAGNOSIS — M62552 Muscle wasting and atrophy, not elsewhere classified, left thigh: Secondary | ICD-10-CM | POA: Diagnosis not present

## 2023-06-29 DIAGNOSIS — M62551 Muscle wasting and atrophy, not elsewhere classified, right thigh: Secondary | ICD-10-CM | POA: Diagnosis not present

## 2023-06-29 DIAGNOSIS — R2681 Unsteadiness on feet: Secondary | ICD-10-CM | POA: Diagnosis not present

## 2023-06-29 DIAGNOSIS — R4789 Other speech disturbances: Secondary | ICD-10-CM | POA: Diagnosis not present

## 2023-06-29 DIAGNOSIS — R498 Other voice and resonance disorders: Secondary | ICD-10-CM | POA: Diagnosis not present

## 2023-06-29 DIAGNOSIS — N3946 Mixed incontinence: Secondary | ICD-10-CM | POA: Diagnosis not present

## 2023-07-02 DIAGNOSIS — R4789 Other speech disturbances: Secondary | ICD-10-CM | POA: Diagnosis not present

## 2023-07-02 DIAGNOSIS — R2681 Unsteadiness on feet: Secondary | ICD-10-CM | POA: Diagnosis not present

## 2023-07-02 DIAGNOSIS — R498 Other voice and resonance disorders: Secondary | ICD-10-CM | POA: Diagnosis not present

## 2023-07-02 DIAGNOSIS — M62552 Muscle wasting and atrophy, not elsewhere classified, left thigh: Secondary | ICD-10-CM | POA: Diagnosis not present

## 2023-07-02 DIAGNOSIS — M62551 Muscle wasting and atrophy, not elsewhere classified, right thigh: Secondary | ICD-10-CM | POA: Diagnosis not present

## 2023-07-02 DIAGNOSIS — N3946 Mixed incontinence: Secondary | ICD-10-CM | POA: Diagnosis not present

## 2023-07-03 DIAGNOSIS — R498 Other voice and resonance disorders: Secondary | ICD-10-CM | POA: Diagnosis not present

## 2023-07-03 DIAGNOSIS — N3946 Mixed incontinence: Secondary | ICD-10-CM | POA: Diagnosis not present

## 2023-07-03 DIAGNOSIS — M62551 Muscle wasting and atrophy, not elsewhere classified, right thigh: Secondary | ICD-10-CM | POA: Diagnosis not present

## 2023-07-03 DIAGNOSIS — M62552 Muscle wasting and atrophy, not elsewhere classified, left thigh: Secondary | ICD-10-CM | POA: Diagnosis not present

## 2023-07-03 DIAGNOSIS — R4789 Other speech disturbances: Secondary | ICD-10-CM | POA: Diagnosis not present

## 2023-07-03 DIAGNOSIS — R2681 Unsteadiness on feet: Secondary | ICD-10-CM | POA: Diagnosis not present

## 2023-07-04 DIAGNOSIS — R2681 Unsteadiness on feet: Secondary | ICD-10-CM | POA: Diagnosis not present

## 2023-07-04 DIAGNOSIS — N3946 Mixed incontinence: Secondary | ICD-10-CM | POA: Diagnosis not present

## 2023-07-04 DIAGNOSIS — R4789 Other speech disturbances: Secondary | ICD-10-CM | POA: Diagnosis not present

## 2023-07-04 DIAGNOSIS — M62552 Muscle wasting and atrophy, not elsewhere classified, left thigh: Secondary | ICD-10-CM | POA: Diagnosis not present

## 2023-07-04 DIAGNOSIS — M62551 Muscle wasting and atrophy, not elsewhere classified, right thigh: Secondary | ICD-10-CM | POA: Diagnosis not present

## 2023-07-04 DIAGNOSIS — R498 Other voice and resonance disorders: Secondary | ICD-10-CM | POA: Diagnosis not present

## 2023-07-04 NOTE — Therapy (Incomplete)
[facility] [address] [place], [place], [address] Phone: [phone]   Fax:  [phone]  Patient Details  Name: Seth Carter MRN: [id number] Date of Birth: 1941-06-06 Referring Provider:  Marden Noble, MD  Encounter Date: 07/05/2023  Occupational Therapy Parkinson's Disease Screen  Hand dominance:  ***   Physical Performance Test item #2 (simulated eating):  *** sec  Physical Performance Test item #4 (donning/doffing jacket):  *** sec  Fastening/unfastening 3 buttons in:  ***sec  9-hole peg test:    RUE  *** sec        LUE  *** sec  Box & Blocks Test:   RUE  *** blocks        LUE  *** blocks  Standing Functional Reach Test:   RUE  *** inches        LUE  *** inches  Change in ability to perform ADLs/IADLs:  ***  Other Comments:  ***  Pt would benefit from occupational therapy evaluation due to  ***  Pt does not require occupational therapy services at this time.  Recommended occupational therapy screen in   ***    Sheran Lawless, [place] 07/04/2023, 8:28 AM  [facility] [address] [place], [place], [address] Phone: [phone]   Fax:  [phone]

## 2023-07-05 ENCOUNTER — Ambulatory Visit: Payer: Medicare Other | Attending: Internal Medicine | Admitting: Occupational Therapy

## 2023-07-05 ENCOUNTER — Ambulatory Visit: Payer: Medicare Other | Admitting: Physical Therapy

## 2023-07-05 ENCOUNTER — Ambulatory Visit: Payer: Medicare Other | Admitting: Speech Pathology

## 2023-07-05 DIAGNOSIS — M62551 Muscle wasting and atrophy, not elsewhere classified, right thigh: Secondary | ICD-10-CM | POA: Diagnosis not present

## 2023-07-05 DIAGNOSIS — R4789 Other speech disturbances: Secondary | ICD-10-CM | POA: Diagnosis not present

## 2023-07-05 DIAGNOSIS — M62552 Muscle wasting and atrophy, not elsewhere classified, left thigh: Secondary | ICD-10-CM | POA: Diagnosis not present

## 2023-07-05 DIAGNOSIS — R498 Other voice and resonance disorders: Secondary | ICD-10-CM | POA: Diagnosis not present

## 2023-07-05 DIAGNOSIS — N3946 Mixed incontinence: Secondary | ICD-10-CM | POA: Diagnosis not present

## 2023-07-05 DIAGNOSIS — R2681 Unsteadiness on feet: Secondary | ICD-10-CM | POA: Diagnosis not present

## 2023-07-06 DIAGNOSIS — M62552 Muscle wasting and atrophy, not elsewhere classified, left thigh: Secondary | ICD-10-CM | POA: Diagnosis not present

## 2023-07-06 DIAGNOSIS — N3946 Mixed incontinence: Secondary | ICD-10-CM | POA: Diagnosis not present

## 2023-07-06 DIAGNOSIS — R4789 Other speech disturbances: Secondary | ICD-10-CM | POA: Diagnosis not present

## 2023-07-06 DIAGNOSIS — R2681 Unsteadiness on feet: Secondary | ICD-10-CM | POA: Diagnosis not present

## 2023-07-06 DIAGNOSIS — M62551 Muscle wasting and atrophy, not elsewhere classified, right thigh: Secondary | ICD-10-CM | POA: Diagnosis not present

## 2023-07-06 DIAGNOSIS — R498 Other voice and resonance disorders: Secondary | ICD-10-CM | POA: Diagnosis not present

## 2023-07-09 DIAGNOSIS — M62552 Muscle wasting and atrophy, not elsewhere classified, left thigh: Secondary | ICD-10-CM | POA: Diagnosis not present

## 2023-07-09 DIAGNOSIS — R498 Other voice and resonance disorders: Secondary | ICD-10-CM | POA: Diagnosis not present

## 2023-07-09 DIAGNOSIS — R2681 Unsteadiness on feet: Secondary | ICD-10-CM | POA: Diagnosis not present

## 2023-07-09 DIAGNOSIS — M62551 Muscle wasting and atrophy, not elsewhere classified, right thigh: Secondary | ICD-10-CM | POA: Diagnosis not present

## 2023-07-09 DIAGNOSIS — N3946 Mixed incontinence: Secondary | ICD-10-CM | POA: Diagnosis not present

## 2023-07-09 DIAGNOSIS — R4789 Other speech disturbances: Secondary | ICD-10-CM | POA: Diagnosis not present

## 2023-07-10 DIAGNOSIS — M62552 Muscle wasting and atrophy, not elsewhere classified, left thigh: Secondary | ICD-10-CM | POA: Diagnosis not present

## 2023-07-10 DIAGNOSIS — M62551 Muscle wasting and atrophy, not elsewhere classified, right thigh: Secondary | ICD-10-CM | POA: Diagnosis not present

## 2023-07-10 DIAGNOSIS — R2681 Unsteadiness on feet: Secondary | ICD-10-CM | POA: Diagnosis not present

## 2023-07-10 DIAGNOSIS — R4789 Other speech disturbances: Secondary | ICD-10-CM | POA: Diagnosis not present

## 2023-07-10 DIAGNOSIS — N3946 Mixed incontinence: Secondary | ICD-10-CM | POA: Diagnosis not present

## 2023-07-10 DIAGNOSIS — R498 Other voice and resonance disorders: Secondary | ICD-10-CM | POA: Diagnosis not present

## 2023-07-11 DIAGNOSIS — M62551 Muscle wasting and atrophy, not elsewhere classified, right thigh: Secondary | ICD-10-CM | POA: Diagnosis not present

## 2023-07-11 DIAGNOSIS — N3946 Mixed incontinence: Secondary | ICD-10-CM | POA: Diagnosis not present

## 2023-07-11 DIAGNOSIS — R498 Other voice and resonance disorders: Secondary | ICD-10-CM | POA: Diagnosis not present

## 2023-07-11 DIAGNOSIS — R4789 Other speech disturbances: Secondary | ICD-10-CM | POA: Diagnosis not present

## 2023-07-11 DIAGNOSIS — R2681 Unsteadiness on feet: Secondary | ICD-10-CM | POA: Diagnosis not present

## 2023-07-11 DIAGNOSIS — M62552 Muscle wasting and atrophy, not elsewhere classified, left thigh: Secondary | ICD-10-CM | POA: Diagnosis not present

## 2023-07-12 DIAGNOSIS — R2681 Unsteadiness on feet: Secondary | ICD-10-CM | POA: Diagnosis not present

## 2023-07-12 DIAGNOSIS — R4789 Other speech disturbances: Secondary | ICD-10-CM | POA: Diagnosis not present

## 2023-07-12 DIAGNOSIS — N3946 Mixed incontinence: Secondary | ICD-10-CM | POA: Diagnosis not present

## 2023-07-12 DIAGNOSIS — M62552 Muscle wasting and atrophy, not elsewhere classified, left thigh: Secondary | ICD-10-CM | POA: Diagnosis not present

## 2023-07-12 DIAGNOSIS — R498 Other voice and resonance disorders: Secondary | ICD-10-CM | POA: Diagnosis not present

## 2023-07-12 DIAGNOSIS — M62551 Muscle wasting and atrophy, not elsewhere classified, right thigh: Secondary | ICD-10-CM | POA: Diagnosis not present

## 2023-07-13 DIAGNOSIS — M62552 Muscle wasting and atrophy, not elsewhere classified, left thigh: Secondary | ICD-10-CM | POA: Diagnosis not present

## 2023-07-13 DIAGNOSIS — N3946 Mixed incontinence: Secondary | ICD-10-CM | POA: Diagnosis not present

## 2023-07-13 DIAGNOSIS — R4789 Other speech disturbances: Secondary | ICD-10-CM | POA: Diagnosis not present

## 2023-07-13 DIAGNOSIS — M62551 Muscle wasting and atrophy, not elsewhere classified, right thigh: Secondary | ICD-10-CM | POA: Diagnosis not present

## 2023-07-13 DIAGNOSIS — R498 Other voice and resonance disorders: Secondary | ICD-10-CM | POA: Diagnosis not present

## 2023-07-13 DIAGNOSIS — R2681 Unsteadiness on feet: Secondary | ICD-10-CM | POA: Diagnosis not present

## 2023-07-15 DIAGNOSIS — R2681 Unsteadiness on feet: Secondary | ICD-10-CM | POA: Diagnosis not present

## 2023-07-15 DIAGNOSIS — R498 Other voice and resonance disorders: Secondary | ICD-10-CM | POA: Diagnosis not present

## 2023-07-15 DIAGNOSIS — M62552 Muscle wasting and atrophy, not elsewhere classified, left thigh: Secondary | ICD-10-CM | POA: Diagnosis not present

## 2023-07-15 DIAGNOSIS — R4789 Other speech disturbances: Secondary | ICD-10-CM | POA: Diagnosis not present

## 2023-07-15 DIAGNOSIS — N3946 Mixed incontinence: Secondary | ICD-10-CM | POA: Diagnosis not present

## 2023-07-15 DIAGNOSIS — M62551 Muscle wasting and atrophy, not elsewhere classified, right thigh: Secondary | ICD-10-CM | POA: Diagnosis not present

## 2023-07-16 DIAGNOSIS — M62551 Muscle wasting and atrophy, not elsewhere classified, right thigh: Secondary | ICD-10-CM | POA: Diagnosis not present

## 2023-07-16 DIAGNOSIS — N3946 Mixed incontinence: Secondary | ICD-10-CM | POA: Diagnosis not present

## 2023-07-16 DIAGNOSIS — M62552 Muscle wasting and atrophy, not elsewhere classified, left thigh: Secondary | ICD-10-CM | POA: Diagnosis not present

## 2023-07-16 DIAGNOSIS — R498 Other voice and resonance disorders: Secondary | ICD-10-CM | POA: Diagnosis not present

## 2023-07-16 DIAGNOSIS — R4789 Other speech disturbances: Secondary | ICD-10-CM | POA: Diagnosis not present

## 2023-07-16 DIAGNOSIS — R2681 Unsteadiness on feet: Secondary | ICD-10-CM | POA: Diagnosis not present

## 2023-07-17 DIAGNOSIS — N3946 Mixed incontinence: Secondary | ICD-10-CM | POA: Diagnosis not present

## 2023-07-17 DIAGNOSIS — R2681 Unsteadiness on feet: Secondary | ICD-10-CM | POA: Diagnosis not present

## 2023-07-17 DIAGNOSIS — R4789 Other speech disturbances: Secondary | ICD-10-CM | POA: Diagnosis not present

## 2023-07-17 DIAGNOSIS — R498 Other voice and resonance disorders: Secondary | ICD-10-CM | POA: Diagnosis not present

## 2023-07-17 DIAGNOSIS — M62551 Muscle wasting and atrophy, not elsewhere classified, right thigh: Secondary | ICD-10-CM | POA: Diagnosis not present

## 2023-07-17 DIAGNOSIS — M62552 Muscle wasting and atrophy, not elsewhere classified, left thigh: Secondary | ICD-10-CM | POA: Diagnosis not present

## 2023-07-20 DIAGNOSIS — N3946 Mixed incontinence: Secondary | ICD-10-CM | POA: Diagnosis not present

## 2023-07-20 DIAGNOSIS — M62551 Muscle wasting and atrophy, not elsewhere classified, right thigh: Secondary | ICD-10-CM | POA: Diagnosis not present

## 2023-07-20 DIAGNOSIS — R2681 Unsteadiness on feet: Secondary | ICD-10-CM | POA: Diagnosis not present

## 2023-07-20 DIAGNOSIS — R4789 Other speech disturbances: Secondary | ICD-10-CM | POA: Diagnosis not present

## 2023-07-20 DIAGNOSIS — R498 Other voice and resonance disorders: Secondary | ICD-10-CM | POA: Diagnosis not present

## 2023-07-20 DIAGNOSIS — M62552 Muscle wasting and atrophy, not elsewhere classified, left thigh: Secondary | ICD-10-CM | POA: Diagnosis not present

## 2023-07-23 DIAGNOSIS — N3946 Mixed incontinence: Secondary | ICD-10-CM | POA: Diagnosis not present

## 2023-07-23 DIAGNOSIS — R4789 Other speech disturbances: Secondary | ICD-10-CM | POA: Diagnosis not present

## 2023-07-23 DIAGNOSIS — M62551 Muscle wasting and atrophy, not elsewhere classified, right thigh: Secondary | ICD-10-CM | POA: Diagnosis not present

## 2023-07-23 DIAGNOSIS — R498 Other voice and resonance disorders: Secondary | ICD-10-CM | POA: Diagnosis not present

## 2023-07-23 DIAGNOSIS — M62552 Muscle wasting and atrophy, not elsewhere classified, left thigh: Secondary | ICD-10-CM | POA: Diagnosis not present

## 2023-07-23 DIAGNOSIS — R2681 Unsteadiness on feet: Secondary | ICD-10-CM | POA: Diagnosis not present

## 2023-07-24 DIAGNOSIS — R498 Other voice and resonance disorders: Secondary | ICD-10-CM | POA: Diagnosis not present

## 2023-07-24 DIAGNOSIS — N3946 Mixed incontinence: Secondary | ICD-10-CM | POA: Diagnosis not present

## 2023-07-24 DIAGNOSIS — R4789 Other speech disturbances: Secondary | ICD-10-CM | POA: Diagnosis not present

## 2023-07-24 DIAGNOSIS — M62551 Muscle wasting and atrophy, not elsewhere classified, right thigh: Secondary | ICD-10-CM | POA: Diagnosis not present

## 2023-07-24 DIAGNOSIS — R2681 Unsteadiness on feet: Secondary | ICD-10-CM | POA: Diagnosis not present

## 2023-07-24 DIAGNOSIS — M62552 Muscle wasting and atrophy, not elsewhere classified, left thigh: Secondary | ICD-10-CM | POA: Diagnosis not present

## 2023-07-26 DIAGNOSIS — R4789 Other speech disturbances: Secondary | ICD-10-CM | POA: Diagnosis not present

## 2023-07-26 DIAGNOSIS — M62551 Muscle wasting and atrophy, not elsewhere classified, right thigh: Secondary | ICD-10-CM | POA: Diagnosis not present

## 2023-07-26 DIAGNOSIS — R498 Other voice and resonance disorders: Secondary | ICD-10-CM | POA: Diagnosis not present

## 2023-07-26 DIAGNOSIS — N3946 Mixed incontinence: Secondary | ICD-10-CM | POA: Diagnosis not present

## 2023-07-26 DIAGNOSIS — G20A1 Parkinson's disease without dyskinesia, without mention of fluctuations: Secondary | ICD-10-CM | POA: Diagnosis not present

## 2023-07-26 DIAGNOSIS — R488 Other symbolic dysfunctions: Secondary | ICD-10-CM | POA: Diagnosis not present

## 2023-07-26 DIAGNOSIS — R4185 Anosognosia: Secondary | ICD-10-CM | POA: Diagnosis not present

## 2023-07-26 DIAGNOSIS — R41841 Cognitive communication deficit: Secondary | ICD-10-CM | POA: Diagnosis not present

## 2023-07-26 DIAGNOSIS — R2689 Other abnormalities of gait and mobility: Secondary | ICD-10-CM | POA: Diagnosis not present

## 2023-07-26 DIAGNOSIS — R2681 Unsteadiness on feet: Secondary | ICD-10-CM | POA: Diagnosis not present

## 2023-07-26 DIAGNOSIS — M62552 Muscle wasting and atrophy, not elsewhere classified, left thigh: Secondary | ICD-10-CM | POA: Diagnosis not present

## 2023-07-27 DIAGNOSIS — R2681 Unsteadiness on feet: Secondary | ICD-10-CM | POA: Diagnosis not present

## 2023-07-27 DIAGNOSIS — R4789 Other speech disturbances: Secondary | ICD-10-CM | POA: Diagnosis not present

## 2023-07-27 DIAGNOSIS — M62552 Muscle wasting and atrophy, not elsewhere classified, left thigh: Secondary | ICD-10-CM | POA: Diagnosis not present

## 2023-07-27 DIAGNOSIS — M62551 Muscle wasting and atrophy, not elsewhere classified, right thigh: Secondary | ICD-10-CM | POA: Diagnosis not present

## 2023-07-27 DIAGNOSIS — R498 Other voice and resonance disorders: Secondary | ICD-10-CM | POA: Diagnosis not present

## 2023-07-27 DIAGNOSIS — N3946 Mixed incontinence: Secondary | ICD-10-CM | POA: Diagnosis not present

## 2023-07-30 DIAGNOSIS — R2681 Unsteadiness on feet: Secondary | ICD-10-CM | POA: Diagnosis not present

## 2023-07-30 DIAGNOSIS — M62552 Muscle wasting and atrophy, not elsewhere classified, left thigh: Secondary | ICD-10-CM | POA: Diagnosis not present

## 2023-07-30 DIAGNOSIS — R498 Other voice and resonance disorders: Secondary | ICD-10-CM | POA: Diagnosis not present

## 2023-07-30 DIAGNOSIS — N3946 Mixed incontinence: Secondary | ICD-10-CM | POA: Diagnosis not present

## 2023-07-30 DIAGNOSIS — R4789 Other speech disturbances: Secondary | ICD-10-CM | POA: Diagnosis not present

## 2023-07-30 DIAGNOSIS — M62551 Muscle wasting and atrophy, not elsewhere classified, right thigh: Secondary | ICD-10-CM | POA: Diagnosis not present

## 2023-07-31 DIAGNOSIS — M62551 Muscle wasting and atrophy, not elsewhere classified, right thigh: Secondary | ICD-10-CM | POA: Diagnosis not present

## 2023-07-31 DIAGNOSIS — M62552 Muscle wasting and atrophy, not elsewhere classified, left thigh: Secondary | ICD-10-CM | POA: Diagnosis not present

## 2023-07-31 DIAGNOSIS — R498 Other voice and resonance disorders: Secondary | ICD-10-CM | POA: Diagnosis not present

## 2023-07-31 DIAGNOSIS — R2681 Unsteadiness on feet: Secondary | ICD-10-CM | POA: Diagnosis not present

## 2023-07-31 DIAGNOSIS — R4789 Other speech disturbances: Secondary | ICD-10-CM | POA: Diagnosis not present

## 2023-07-31 DIAGNOSIS — N3946 Mixed incontinence: Secondary | ICD-10-CM | POA: Diagnosis not present

## 2023-08-01 DIAGNOSIS — R498 Other voice and resonance disorders: Secondary | ICD-10-CM | POA: Diagnosis not present

## 2023-08-01 DIAGNOSIS — M62551 Muscle wasting and atrophy, not elsewhere classified, right thigh: Secondary | ICD-10-CM | POA: Diagnosis not present

## 2023-08-01 DIAGNOSIS — R4789 Other speech disturbances: Secondary | ICD-10-CM | POA: Diagnosis not present

## 2023-08-01 DIAGNOSIS — M62552 Muscle wasting and atrophy, not elsewhere classified, left thigh: Secondary | ICD-10-CM | POA: Diagnosis not present

## 2023-08-01 DIAGNOSIS — R2681 Unsteadiness on feet: Secondary | ICD-10-CM | POA: Diagnosis not present

## 2023-08-01 DIAGNOSIS — N3946 Mixed incontinence: Secondary | ICD-10-CM | POA: Diagnosis not present

## 2023-08-02 DIAGNOSIS — M62552 Muscle wasting and atrophy, not elsewhere classified, left thigh: Secondary | ICD-10-CM | POA: Diagnosis not present

## 2023-08-02 DIAGNOSIS — R498 Other voice and resonance disorders: Secondary | ICD-10-CM | POA: Diagnosis not present

## 2023-08-02 DIAGNOSIS — N3946 Mixed incontinence: Secondary | ICD-10-CM | POA: Diagnosis not present

## 2023-08-02 DIAGNOSIS — R2681 Unsteadiness on feet: Secondary | ICD-10-CM | POA: Diagnosis not present

## 2023-08-02 DIAGNOSIS — R4789 Other speech disturbances: Secondary | ICD-10-CM | POA: Diagnosis not present

## 2023-08-02 DIAGNOSIS — M62551 Muscle wasting and atrophy, not elsewhere classified, right thigh: Secondary | ICD-10-CM | POA: Diagnosis not present

## 2023-08-03 DIAGNOSIS — N3946 Mixed incontinence: Secondary | ICD-10-CM | POA: Diagnosis not present

## 2023-08-03 DIAGNOSIS — M62551 Muscle wasting and atrophy, not elsewhere classified, right thigh: Secondary | ICD-10-CM | POA: Diagnosis not present

## 2023-08-03 DIAGNOSIS — R4789 Other speech disturbances: Secondary | ICD-10-CM | POA: Diagnosis not present

## 2023-08-03 DIAGNOSIS — R2681 Unsteadiness on feet: Secondary | ICD-10-CM | POA: Diagnosis not present

## 2023-08-03 DIAGNOSIS — M62552 Muscle wasting and atrophy, not elsewhere classified, left thigh: Secondary | ICD-10-CM | POA: Diagnosis not present

## 2023-08-03 DIAGNOSIS — R498 Other voice and resonance disorders: Secondary | ICD-10-CM | POA: Diagnosis not present

## 2023-08-06 DIAGNOSIS — R498 Other voice and resonance disorders: Secondary | ICD-10-CM | POA: Diagnosis not present

## 2023-08-06 DIAGNOSIS — R4789 Other speech disturbances: Secondary | ICD-10-CM | POA: Diagnosis not present

## 2023-08-06 DIAGNOSIS — M62551 Muscle wasting and atrophy, not elsewhere classified, right thigh: Secondary | ICD-10-CM | POA: Diagnosis not present

## 2023-08-06 DIAGNOSIS — N3946 Mixed incontinence: Secondary | ICD-10-CM | POA: Diagnosis not present

## 2023-08-06 DIAGNOSIS — M62552 Muscle wasting and atrophy, not elsewhere classified, left thigh: Secondary | ICD-10-CM | POA: Diagnosis not present

## 2023-08-06 DIAGNOSIS — R2681 Unsteadiness on feet: Secondary | ICD-10-CM | POA: Diagnosis not present

## 2023-08-07 DIAGNOSIS — N3946 Mixed incontinence: Secondary | ICD-10-CM | POA: Diagnosis not present

## 2023-08-07 DIAGNOSIS — R2681 Unsteadiness on feet: Secondary | ICD-10-CM | POA: Diagnosis not present

## 2023-08-07 DIAGNOSIS — R4789 Other speech disturbances: Secondary | ICD-10-CM | POA: Diagnosis not present

## 2023-08-07 DIAGNOSIS — R498 Other voice and resonance disorders: Secondary | ICD-10-CM | POA: Diagnosis not present

## 2023-08-07 DIAGNOSIS — M62552 Muscle wasting and atrophy, not elsewhere classified, left thigh: Secondary | ICD-10-CM | POA: Diagnosis not present

## 2023-08-07 DIAGNOSIS — M62551 Muscle wasting and atrophy, not elsewhere classified, right thigh: Secondary | ICD-10-CM | POA: Diagnosis not present

## 2023-08-08 DIAGNOSIS — R498 Other voice and resonance disorders: Secondary | ICD-10-CM | POA: Diagnosis not present

## 2023-08-08 DIAGNOSIS — R4789 Other speech disturbances: Secondary | ICD-10-CM | POA: Diagnosis not present

## 2023-08-08 DIAGNOSIS — R2681 Unsteadiness on feet: Secondary | ICD-10-CM | POA: Diagnosis not present

## 2023-08-08 DIAGNOSIS — N3946 Mixed incontinence: Secondary | ICD-10-CM | POA: Diagnosis not present

## 2023-08-08 DIAGNOSIS — M62551 Muscle wasting and atrophy, not elsewhere classified, right thigh: Secondary | ICD-10-CM | POA: Diagnosis not present

## 2023-08-08 DIAGNOSIS — M62552 Muscle wasting and atrophy, not elsewhere classified, left thigh: Secondary | ICD-10-CM | POA: Diagnosis not present

## 2023-08-09 DIAGNOSIS — N3946 Mixed incontinence: Secondary | ICD-10-CM | POA: Diagnosis not present

## 2023-08-09 DIAGNOSIS — M62551 Muscle wasting and atrophy, not elsewhere classified, right thigh: Secondary | ICD-10-CM | POA: Diagnosis not present

## 2023-08-09 DIAGNOSIS — M62552 Muscle wasting and atrophy, not elsewhere classified, left thigh: Secondary | ICD-10-CM | POA: Diagnosis not present

## 2023-08-09 DIAGNOSIS — R498 Other voice and resonance disorders: Secondary | ICD-10-CM | POA: Diagnosis not present

## 2023-08-09 DIAGNOSIS — R2681 Unsteadiness on feet: Secondary | ICD-10-CM | POA: Diagnosis not present

## 2023-08-09 DIAGNOSIS — R4789 Other speech disturbances: Secondary | ICD-10-CM | POA: Diagnosis not present

## 2023-08-10 DIAGNOSIS — R498 Other voice and resonance disorders: Secondary | ICD-10-CM | POA: Diagnosis not present

## 2023-08-10 DIAGNOSIS — M62551 Muscle wasting and atrophy, not elsewhere classified, right thigh: Secondary | ICD-10-CM | POA: Diagnosis not present

## 2023-08-10 DIAGNOSIS — R2681 Unsteadiness on feet: Secondary | ICD-10-CM | POA: Diagnosis not present

## 2023-08-10 DIAGNOSIS — R4789 Other speech disturbances: Secondary | ICD-10-CM | POA: Diagnosis not present

## 2023-08-10 DIAGNOSIS — N3946 Mixed incontinence: Secondary | ICD-10-CM | POA: Diagnosis not present

## 2023-08-10 DIAGNOSIS — M62552 Muscle wasting and atrophy, not elsewhere classified, left thigh: Secondary | ICD-10-CM | POA: Diagnosis not present

## 2023-08-13 DIAGNOSIS — M62552 Muscle wasting and atrophy, not elsewhere classified, left thigh: Secondary | ICD-10-CM | POA: Diagnosis not present

## 2023-08-13 DIAGNOSIS — R498 Other voice and resonance disorders: Secondary | ICD-10-CM | POA: Diagnosis not present

## 2023-08-13 DIAGNOSIS — M62551 Muscle wasting and atrophy, not elsewhere classified, right thigh: Secondary | ICD-10-CM | POA: Diagnosis not present

## 2023-08-13 DIAGNOSIS — R4789 Other speech disturbances: Secondary | ICD-10-CM | POA: Diagnosis not present

## 2023-08-13 DIAGNOSIS — R2681 Unsteadiness on feet: Secondary | ICD-10-CM | POA: Diagnosis not present

## 2023-08-13 DIAGNOSIS — N3946 Mixed incontinence: Secondary | ICD-10-CM | POA: Diagnosis not present

## 2023-08-14 DIAGNOSIS — M62552 Muscle wasting and atrophy, not elsewhere classified, left thigh: Secondary | ICD-10-CM | POA: Diagnosis not present

## 2023-08-14 DIAGNOSIS — N3946 Mixed incontinence: Secondary | ICD-10-CM | POA: Diagnosis not present

## 2023-08-14 DIAGNOSIS — R4789 Other speech disturbances: Secondary | ICD-10-CM | POA: Diagnosis not present

## 2023-08-14 DIAGNOSIS — M62551 Muscle wasting and atrophy, not elsewhere classified, right thigh: Secondary | ICD-10-CM | POA: Diagnosis not present

## 2023-08-14 DIAGNOSIS — R2681 Unsteadiness on feet: Secondary | ICD-10-CM | POA: Diagnosis not present

## 2023-08-14 DIAGNOSIS — R498 Other voice and resonance disorders: Secondary | ICD-10-CM | POA: Diagnosis not present

## 2023-08-15 DIAGNOSIS — M62551 Muscle wasting and atrophy, not elsewhere classified, right thigh: Secondary | ICD-10-CM | POA: Diagnosis not present

## 2023-08-15 DIAGNOSIS — R2681 Unsteadiness on feet: Secondary | ICD-10-CM | POA: Diagnosis not present

## 2023-08-15 DIAGNOSIS — R498 Other voice and resonance disorders: Secondary | ICD-10-CM | POA: Diagnosis not present

## 2023-08-15 DIAGNOSIS — R4789 Other speech disturbances: Secondary | ICD-10-CM | POA: Diagnosis not present

## 2023-08-15 DIAGNOSIS — M62552 Muscle wasting and atrophy, not elsewhere classified, left thigh: Secondary | ICD-10-CM | POA: Diagnosis not present

## 2023-08-15 DIAGNOSIS — N3946 Mixed incontinence: Secondary | ICD-10-CM | POA: Diagnosis not present

## 2023-08-16 DIAGNOSIS — M62552 Muscle wasting and atrophy, not elsewhere classified, left thigh: Secondary | ICD-10-CM | POA: Diagnosis not present

## 2023-08-16 DIAGNOSIS — R4789 Other speech disturbances: Secondary | ICD-10-CM | POA: Diagnosis not present

## 2023-08-16 DIAGNOSIS — M62551 Muscle wasting and atrophy, not elsewhere classified, right thigh: Secondary | ICD-10-CM | POA: Diagnosis not present

## 2023-08-16 DIAGNOSIS — R2681 Unsteadiness on feet: Secondary | ICD-10-CM | POA: Diagnosis not present

## 2023-08-16 DIAGNOSIS — R498 Other voice and resonance disorders: Secondary | ICD-10-CM | POA: Diagnosis not present

## 2023-08-16 DIAGNOSIS — N3946 Mixed incontinence: Secondary | ICD-10-CM | POA: Diagnosis not present

## 2023-08-17 DIAGNOSIS — R4789 Other speech disturbances: Secondary | ICD-10-CM | POA: Diagnosis not present

## 2023-08-17 DIAGNOSIS — R498 Other voice and resonance disorders: Secondary | ICD-10-CM | POA: Diagnosis not present

## 2023-08-17 DIAGNOSIS — R2681 Unsteadiness on feet: Secondary | ICD-10-CM | POA: Diagnosis not present

## 2023-08-17 DIAGNOSIS — N3946 Mixed incontinence: Secondary | ICD-10-CM | POA: Diagnosis not present

## 2023-08-17 DIAGNOSIS — M62552 Muscle wasting and atrophy, not elsewhere classified, left thigh: Secondary | ICD-10-CM | POA: Diagnosis not present

## 2023-08-17 DIAGNOSIS — M62551 Muscle wasting and atrophy, not elsewhere classified, right thigh: Secondary | ICD-10-CM | POA: Diagnosis not present

## 2023-08-20 DIAGNOSIS — N401 Enlarged prostate with lower urinary tract symptoms: Secondary | ICD-10-CM | POA: Diagnosis not present

## 2023-08-20 DIAGNOSIS — R3915 Urgency of urination: Secondary | ICD-10-CM | POA: Diagnosis not present

## 2023-08-20 DIAGNOSIS — M62552 Muscle wasting and atrophy, not elsewhere classified, left thigh: Secondary | ICD-10-CM | POA: Diagnosis not present

## 2023-08-20 DIAGNOSIS — R498 Other voice and resonance disorders: Secondary | ICD-10-CM | POA: Diagnosis not present

## 2023-08-20 DIAGNOSIS — M62551 Muscle wasting and atrophy, not elsewhere classified, right thigh: Secondary | ICD-10-CM | POA: Diagnosis not present

## 2023-08-20 DIAGNOSIS — R2681 Unsteadiness on feet: Secondary | ICD-10-CM | POA: Diagnosis not present

## 2023-08-20 DIAGNOSIS — R4789 Other speech disturbances: Secondary | ICD-10-CM | POA: Diagnosis not present

## 2023-08-20 DIAGNOSIS — N3946 Mixed incontinence: Secondary | ICD-10-CM | POA: Diagnosis not present

## 2023-08-21 DIAGNOSIS — N3946 Mixed incontinence: Secondary | ICD-10-CM | POA: Diagnosis not present

## 2023-08-21 DIAGNOSIS — G20B2 Parkinson's disease with dyskinesia, with fluctuations: Secondary | ICD-10-CM | POA: Diagnosis not present

## 2023-08-21 DIAGNOSIS — R4789 Other speech disturbances: Secondary | ICD-10-CM | POA: Diagnosis not present

## 2023-08-21 DIAGNOSIS — R2681 Unsteadiness on feet: Secondary | ICD-10-CM | POA: Diagnosis not present

## 2023-08-21 DIAGNOSIS — Z8673 Personal history of transient ischemic attack (TIA), and cerebral infarction without residual deficits: Secondary | ICD-10-CM | POA: Diagnosis not present

## 2023-08-21 DIAGNOSIS — I951 Orthostatic hypotension: Secondary | ICD-10-CM | POA: Diagnosis not present

## 2023-08-21 DIAGNOSIS — R498 Other voice and resonance disorders: Secondary | ICD-10-CM | POA: Diagnosis not present

## 2023-08-21 DIAGNOSIS — M62551 Muscle wasting and atrophy, not elsewhere classified, right thigh: Secondary | ICD-10-CM | POA: Diagnosis not present

## 2023-08-21 DIAGNOSIS — F32A Depression, unspecified: Secondary | ICD-10-CM | POA: Diagnosis not present

## 2023-08-21 DIAGNOSIS — M62552 Muscle wasting and atrophy, not elsewhere classified, left thigh: Secondary | ICD-10-CM | POA: Diagnosis not present

## 2023-08-22 DIAGNOSIS — M62552 Muscle wasting and atrophy, not elsewhere classified, left thigh: Secondary | ICD-10-CM | POA: Diagnosis not present

## 2023-08-22 DIAGNOSIS — R498 Other voice and resonance disorders: Secondary | ICD-10-CM | POA: Diagnosis not present

## 2023-08-22 DIAGNOSIS — M62551 Muscle wasting and atrophy, not elsewhere classified, right thigh: Secondary | ICD-10-CM | POA: Diagnosis not present

## 2023-08-22 DIAGNOSIS — R2681 Unsteadiness on feet: Secondary | ICD-10-CM | POA: Diagnosis not present

## 2023-08-22 DIAGNOSIS — R4789 Other speech disturbances: Secondary | ICD-10-CM | POA: Diagnosis not present

## 2023-08-22 DIAGNOSIS — N3946 Mixed incontinence: Secondary | ICD-10-CM | POA: Diagnosis not present

## 2023-08-23 DIAGNOSIS — R4789 Other speech disturbances: Secondary | ICD-10-CM | POA: Diagnosis not present

## 2023-08-23 DIAGNOSIS — M62551 Muscle wasting and atrophy, not elsewhere classified, right thigh: Secondary | ICD-10-CM | POA: Diagnosis not present

## 2023-08-23 DIAGNOSIS — N3946 Mixed incontinence: Secondary | ICD-10-CM | POA: Diagnosis not present

## 2023-08-23 DIAGNOSIS — R2681 Unsteadiness on feet: Secondary | ICD-10-CM | POA: Diagnosis not present

## 2023-08-23 DIAGNOSIS — R498 Other voice and resonance disorders: Secondary | ICD-10-CM | POA: Diagnosis not present

## 2023-08-23 DIAGNOSIS — M62552 Muscle wasting and atrophy, not elsewhere classified, left thigh: Secondary | ICD-10-CM | POA: Diagnosis not present

## 2023-08-24 DIAGNOSIS — N3946 Mixed incontinence: Secondary | ICD-10-CM | POA: Diagnosis not present

## 2023-08-24 DIAGNOSIS — M62551 Muscle wasting and atrophy, not elsewhere classified, right thigh: Secondary | ICD-10-CM | POA: Diagnosis not present

## 2023-08-24 DIAGNOSIS — R498 Other voice and resonance disorders: Secondary | ICD-10-CM | POA: Diagnosis not present

## 2023-08-24 DIAGNOSIS — R4789 Other speech disturbances: Secondary | ICD-10-CM | POA: Diagnosis not present

## 2023-08-24 DIAGNOSIS — M62552 Muscle wasting and atrophy, not elsewhere classified, left thigh: Secondary | ICD-10-CM | POA: Diagnosis not present

## 2023-08-24 DIAGNOSIS — R2681 Unsteadiness on feet: Secondary | ICD-10-CM | POA: Diagnosis not present

## 2023-08-27 DIAGNOSIS — M62551 Muscle wasting and atrophy, not elsewhere classified, right thigh: Secondary | ICD-10-CM | POA: Diagnosis not present

## 2023-08-27 DIAGNOSIS — R2689 Other abnormalities of gait and mobility: Secondary | ICD-10-CM | POA: Diagnosis not present

## 2023-08-27 DIAGNOSIS — M62552 Muscle wasting and atrophy, not elsewhere classified, left thigh: Secondary | ICD-10-CM | POA: Diagnosis not present

## 2023-08-27 DIAGNOSIS — G20A1 Parkinson's disease without dyskinesia, without mention of fluctuations: Secondary | ICD-10-CM | POA: Diagnosis not present

## 2023-08-27 DIAGNOSIS — R2681 Unsteadiness on feet: Secondary | ICD-10-CM | POA: Diagnosis not present

## 2023-08-27 DIAGNOSIS — R498 Other voice and resonance disorders: Secondary | ICD-10-CM | POA: Diagnosis not present

## 2023-08-27 DIAGNOSIS — R4789 Other speech disturbances: Secondary | ICD-10-CM | POA: Diagnosis not present

## 2023-08-27 DIAGNOSIS — R488 Other symbolic dysfunctions: Secondary | ICD-10-CM | POA: Diagnosis not present

## 2023-08-27 DIAGNOSIS — N3946 Mixed incontinence: Secondary | ICD-10-CM | POA: Diagnosis not present

## 2023-08-27 DIAGNOSIS — R4185 Anosognosia: Secondary | ICD-10-CM | POA: Diagnosis not present

## 2023-08-27 DIAGNOSIS — R41841 Cognitive communication deficit: Secondary | ICD-10-CM | POA: Diagnosis not present

## 2023-08-28 DIAGNOSIS — M62552 Muscle wasting and atrophy, not elsewhere classified, left thigh: Secondary | ICD-10-CM | POA: Diagnosis not present

## 2023-08-28 DIAGNOSIS — N3946 Mixed incontinence: Secondary | ICD-10-CM | POA: Diagnosis not present

## 2023-08-28 DIAGNOSIS — R2681 Unsteadiness on feet: Secondary | ICD-10-CM | POA: Diagnosis not present

## 2023-08-28 DIAGNOSIS — R498 Other voice and resonance disorders: Secondary | ICD-10-CM | POA: Diagnosis not present

## 2023-08-28 DIAGNOSIS — M62551 Muscle wasting and atrophy, not elsewhere classified, right thigh: Secondary | ICD-10-CM | POA: Diagnosis not present

## 2023-08-28 DIAGNOSIS — R4789 Other speech disturbances: Secondary | ICD-10-CM | POA: Diagnosis not present

## 2023-08-29 DIAGNOSIS — M62552 Muscle wasting and atrophy, not elsewhere classified, left thigh: Secondary | ICD-10-CM | POA: Diagnosis not present

## 2023-08-29 DIAGNOSIS — N3946 Mixed incontinence: Secondary | ICD-10-CM | POA: Diagnosis not present

## 2023-08-29 DIAGNOSIS — R498 Other voice and resonance disorders: Secondary | ICD-10-CM | POA: Diagnosis not present

## 2023-08-29 DIAGNOSIS — M62551 Muscle wasting and atrophy, not elsewhere classified, right thigh: Secondary | ICD-10-CM | POA: Diagnosis not present

## 2023-08-29 DIAGNOSIS — R2681 Unsteadiness on feet: Secondary | ICD-10-CM | POA: Diagnosis not present

## 2023-08-29 DIAGNOSIS — R4789 Other speech disturbances: Secondary | ICD-10-CM | POA: Diagnosis not present

## 2023-08-30 DIAGNOSIS — R498 Other voice and resonance disorders: Secondary | ICD-10-CM | POA: Diagnosis not present

## 2023-08-30 DIAGNOSIS — M62551 Muscle wasting and atrophy, not elsewhere classified, right thigh: Secondary | ICD-10-CM | POA: Diagnosis not present

## 2023-08-30 DIAGNOSIS — R2681 Unsteadiness on feet: Secondary | ICD-10-CM | POA: Diagnosis not present

## 2023-08-30 DIAGNOSIS — N3946 Mixed incontinence: Secondary | ICD-10-CM | POA: Diagnosis not present

## 2023-08-30 DIAGNOSIS — M62552 Muscle wasting and atrophy, not elsewhere classified, left thigh: Secondary | ICD-10-CM | POA: Diagnosis not present

## 2023-08-30 DIAGNOSIS — R4789 Other speech disturbances: Secondary | ICD-10-CM | POA: Diagnosis not present

## 2023-08-31 DIAGNOSIS — N3946 Mixed incontinence: Secondary | ICD-10-CM | POA: Diagnosis not present

## 2023-08-31 DIAGNOSIS — M62551 Muscle wasting and atrophy, not elsewhere classified, right thigh: Secondary | ICD-10-CM | POA: Diagnosis not present

## 2023-08-31 DIAGNOSIS — R498 Other voice and resonance disorders: Secondary | ICD-10-CM | POA: Diagnosis not present

## 2023-08-31 DIAGNOSIS — R2681 Unsteadiness on feet: Secondary | ICD-10-CM | POA: Diagnosis not present

## 2023-08-31 DIAGNOSIS — M62552 Muscle wasting and atrophy, not elsewhere classified, left thigh: Secondary | ICD-10-CM | POA: Diagnosis not present

## 2023-08-31 DIAGNOSIS — R4789 Other speech disturbances: Secondary | ICD-10-CM | POA: Diagnosis not present

## 2023-09-03 DIAGNOSIS — R4789 Other speech disturbances: Secondary | ICD-10-CM | POA: Diagnosis not present

## 2023-09-03 DIAGNOSIS — N3946 Mixed incontinence: Secondary | ICD-10-CM | POA: Diagnosis not present

## 2023-09-03 DIAGNOSIS — M62552 Muscle wasting and atrophy, not elsewhere classified, left thigh: Secondary | ICD-10-CM | POA: Diagnosis not present

## 2023-09-03 DIAGNOSIS — M62551 Muscle wasting and atrophy, not elsewhere classified, right thigh: Secondary | ICD-10-CM | POA: Diagnosis not present

## 2023-09-03 DIAGNOSIS — R2681 Unsteadiness on feet: Secondary | ICD-10-CM | POA: Diagnosis not present

## 2023-09-03 DIAGNOSIS — R498 Other voice and resonance disorders: Secondary | ICD-10-CM | POA: Diagnosis not present

## 2023-09-04 DIAGNOSIS — M62551 Muscle wasting and atrophy, not elsewhere classified, right thigh: Secondary | ICD-10-CM | POA: Diagnosis not present

## 2023-09-04 DIAGNOSIS — R498 Other voice and resonance disorders: Secondary | ICD-10-CM | POA: Diagnosis not present

## 2023-09-04 DIAGNOSIS — M62552 Muscle wasting and atrophy, not elsewhere classified, left thigh: Secondary | ICD-10-CM | POA: Diagnosis not present

## 2023-09-04 DIAGNOSIS — R2681 Unsteadiness on feet: Secondary | ICD-10-CM | POA: Diagnosis not present

## 2023-09-04 DIAGNOSIS — R4789 Other speech disturbances: Secondary | ICD-10-CM | POA: Diagnosis not present

## 2023-09-04 DIAGNOSIS — N3946 Mixed incontinence: Secondary | ICD-10-CM | POA: Diagnosis not present

## 2023-09-05 DIAGNOSIS — R4789 Other speech disturbances: Secondary | ICD-10-CM | POA: Diagnosis not present

## 2023-09-05 DIAGNOSIS — R498 Other voice and resonance disorders: Secondary | ICD-10-CM | POA: Diagnosis not present

## 2023-09-05 DIAGNOSIS — R2681 Unsteadiness on feet: Secondary | ICD-10-CM | POA: Diagnosis not present

## 2023-09-05 DIAGNOSIS — N3946 Mixed incontinence: Secondary | ICD-10-CM | POA: Diagnosis not present

## 2023-09-05 DIAGNOSIS — M62551 Muscle wasting and atrophy, not elsewhere classified, right thigh: Secondary | ICD-10-CM | POA: Diagnosis not present

## 2023-09-05 DIAGNOSIS — M62552 Muscle wasting and atrophy, not elsewhere classified, left thigh: Secondary | ICD-10-CM | POA: Diagnosis not present

## 2023-09-06 DIAGNOSIS — M62551 Muscle wasting and atrophy, not elsewhere classified, right thigh: Secondary | ICD-10-CM | POA: Diagnosis not present

## 2023-09-06 DIAGNOSIS — R498 Other voice and resonance disorders: Secondary | ICD-10-CM | POA: Diagnosis not present

## 2023-09-06 DIAGNOSIS — N3946 Mixed incontinence: Secondary | ICD-10-CM | POA: Diagnosis not present

## 2023-09-06 DIAGNOSIS — R2681 Unsteadiness on feet: Secondary | ICD-10-CM | POA: Diagnosis not present

## 2023-09-06 DIAGNOSIS — R4789 Other speech disturbances: Secondary | ICD-10-CM | POA: Diagnosis not present

## 2023-09-06 DIAGNOSIS — M62552 Muscle wasting and atrophy, not elsewhere classified, left thigh: Secondary | ICD-10-CM | POA: Diagnosis not present

## 2023-09-07 DIAGNOSIS — R498 Other voice and resonance disorders: Secondary | ICD-10-CM | POA: Diagnosis not present

## 2023-09-07 DIAGNOSIS — M62551 Muscle wasting and atrophy, not elsewhere classified, right thigh: Secondary | ICD-10-CM | POA: Diagnosis not present

## 2023-09-07 DIAGNOSIS — R4789 Other speech disturbances: Secondary | ICD-10-CM | POA: Diagnosis not present

## 2023-09-07 DIAGNOSIS — M62552 Muscle wasting and atrophy, not elsewhere classified, left thigh: Secondary | ICD-10-CM | POA: Diagnosis not present

## 2023-09-07 DIAGNOSIS — R2681 Unsteadiness on feet: Secondary | ICD-10-CM | POA: Diagnosis not present

## 2023-09-07 DIAGNOSIS — N3946 Mixed incontinence: Secondary | ICD-10-CM | POA: Diagnosis not present

## 2023-09-09 DIAGNOSIS — M62551 Muscle wasting and atrophy, not elsewhere classified, right thigh: Secondary | ICD-10-CM | POA: Diagnosis not present

## 2023-09-09 DIAGNOSIS — R498 Other voice and resonance disorders: Secondary | ICD-10-CM | POA: Diagnosis not present

## 2023-09-09 DIAGNOSIS — M62552 Muscle wasting and atrophy, not elsewhere classified, left thigh: Secondary | ICD-10-CM | POA: Diagnosis not present

## 2023-09-09 DIAGNOSIS — R2681 Unsteadiness on feet: Secondary | ICD-10-CM | POA: Diagnosis not present

## 2023-09-09 DIAGNOSIS — R4789 Other speech disturbances: Secondary | ICD-10-CM | POA: Diagnosis not present

## 2023-09-09 DIAGNOSIS — N3946 Mixed incontinence: Secondary | ICD-10-CM | POA: Diagnosis not present

## 2023-09-10 DIAGNOSIS — N3946 Mixed incontinence: Secondary | ICD-10-CM | POA: Diagnosis not present

## 2023-09-10 DIAGNOSIS — R4789 Other speech disturbances: Secondary | ICD-10-CM | POA: Diagnosis not present

## 2023-09-10 DIAGNOSIS — R498 Other voice and resonance disorders: Secondary | ICD-10-CM | POA: Diagnosis not present

## 2023-09-10 DIAGNOSIS — R2681 Unsteadiness on feet: Secondary | ICD-10-CM | POA: Diagnosis not present

## 2023-09-10 DIAGNOSIS — M62552 Muscle wasting and atrophy, not elsewhere classified, left thigh: Secondary | ICD-10-CM | POA: Diagnosis not present

## 2023-09-10 DIAGNOSIS — M62551 Muscle wasting and atrophy, not elsewhere classified, right thigh: Secondary | ICD-10-CM | POA: Diagnosis not present

## 2023-09-11 DIAGNOSIS — M62551 Muscle wasting and atrophy, not elsewhere classified, right thigh: Secondary | ICD-10-CM | POA: Diagnosis not present

## 2023-09-11 DIAGNOSIS — R2681 Unsteadiness on feet: Secondary | ICD-10-CM | POA: Diagnosis not present

## 2023-09-11 DIAGNOSIS — R498 Other voice and resonance disorders: Secondary | ICD-10-CM | POA: Diagnosis not present

## 2023-09-11 DIAGNOSIS — R4789 Other speech disturbances: Secondary | ICD-10-CM | POA: Diagnosis not present

## 2023-09-11 DIAGNOSIS — M62552 Muscle wasting and atrophy, not elsewhere classified, left thigh: Secondary | ICD-10-CM | POA: Diagnosis not present

## 2023-09-11 DIAGNOSIS — N3946 Mixed incontinence: Secondary | ICD-10-CM | POA: Diagnosis not present

## 2023-09-12 DIAGNOSIS — R498 Other voice and resonance disorders: Secondary | ICD-10-CM | POA: Diagnosis not present

## 2023-09-12 DIAGNOSIS — R2681 Unsteadiness on feet: Secondary | ICD-10-CM | POA: Diagnosis not present

## 2023-09-12 DIAGNOSIS — R4789 Other speech disturbances: Secondary | ICD-10-CM | POA: Diagnosis not present

## 2023-09-12 DIAGNOSIS — M62552 Muscle wasting and atrophy, not elsewhere classified, left thigh: Secondary | ICD-10-CM | POA: Diagnosis not present

## 2023-09-12 DIAGNOSIS — M62551 Muscle wasting and atrophy, not elsewhere classified, right thigh: Secondary | ICD-10-CM | POA: Diagnosis not present

## 2023-09-12 DIAGNOSIS — H04123 Dry eye syndrome of bilateral lacrimal glands: Secondary | ICD-10-CM | POA: Diagnosis not present

## 2023-09-12 DIAGNOSIS — N3946 Mixed incontinence: Secondary | ICD-10-CM | POA: Diagnosis not present

## 2023-09-13 DIAGNOSIS — M62552 Muscle wasting and atrophy, not elsewhere classified, left thigh: Secondary | ICD-10-CM | POA: Diagnosis not present

## 2023-09-13 DIAGNOSIS — R4789 Other speech disturbances: Secondary | ICD-10-CM | POA: Diagnosis not present

## 2023-09-13 DIAGNOSIS — N3946 Mixed incontinence: Secondary | ICD-10-CM | POA: Diagnosis not present

## 2023-09-13 DIAGNOSIS — M62551 Muscle wasting and atrophy, not elsewhere classified, right thigh: Secondary | ICD-10-CM | POA: Diagnosis not present

## 2023-09-13 DIAGNOSIS — R498 Other voice and resonance disorders: Secondary | ICD-10-CM | POA: Diagnosis not present

## 2023-09-13 DIAGNOSIS — R2681 Unsteadiness on feet: Secondary | ICD-10-CM | POA: Diagnosis not present

## 2023-09-14 DIAGNOSIS — M62552 Muscle wasting and atrophy, not elsewhere classified, left thigh: Secondary | ICD-10-CM | POA: Diagnosis not present

## 2023-09-14 DIAGNOSIS — R498 Other voice and resonance disorders: Secondary | ICD-10-CM | POA: Diagnosis not present

## 2023-09-14 DIAGNOSIS — M62551 Muscle wasting and atrophy, not elsewhere classified, right thigh: Secondary | ICD-10-CM | POA: Diagnosis not present

## 2023-09-14 DIAGNOSIS — R4789 Other speech disturbances: Secondary | ICD-10-CM | POA: Diagnosis not present

## 2023-09-14 DIAGNOSIS — R2681 Unsteadiness on feet: Secondary | ICD-10-CM | POA: Diagnosis not present

## 2023-09-14 DIAGNOSIS — N3946 Mixed incontinence: Secondary | ICD-10-CM | POA: Diagnosis not present

## 2023-09-17 DIAGNOSIS — G903 Multi-system degeneration of the autonomic nervous system: Secondary | ICD-10-CM | POA: Diagnosis not present

## 2023-09-17 DIAGNOSIS — M62551 Muscle wasting and atrophy, not elsewhere classified, right thigh: Secondary | ICD-10-CM | POA: Diagnosis not present

## 2023-09-17 DIAGNOSIS — M62552 Muscle wasting and atrophy, not elsewhere classified, left thigh: Secondary | ICD-10-CM | POA: Diagnosis not present

## 2023-09-17 DIAGNOSIS — R4789 Other speech disturbances: Secondary | ICD-10-CM | POA: Diagnosis not present

## 2023-09-17 DIAGNOSIS — Z79899 Other long term (current) drug therapy: Secondary | ICD-10-CM | POA: Diagnosis not present

## 2023-09-17 DIAGNOSIS — G3184 Mild cognitive impairment, so stated: Secondary | ICD-10-CM | POA: Diagnosis not present

## 2023-09-17 DIAGNOSIS — R2681 Unsteadiness on feet: Secondary | ICD-10-CM | POA: Diagnosis not present

## 2023-09-17 DIAGNOSIS — N3946 Mixed incontinence: Secondary | ICD-10-CM | POA: Diagnosis not present

## 2023-09-17 DIAGNOSIS — R350 Frequency of micturition: Secondary | ICD-10-CM | POA: Diagnosis not present

## 2023-09-17 DIAGNOSIS — G20A1 Parkinson's disease without dyskinesia, without mention of fluctuations: Secondary | ICD-10-CM | POA: Diagnosis not present

## 2023-09-17 DIAGNOSIS — R498 Other voice and resonance disorders: Secondary | ICD-10-CM | POA: Diagnosis not present

## 2023-09-17 DIAGNOSIS — R32 Unspecified urinary incontinence: Secondary | ICD-10-CM | POA: Diagnosis not present

## 2023-09-18 DIAGNOSIS — R2681 Unsteadiness on feet: Secondary | ICD-10-CM | POA: Diagnosis not present

## 2023-09-18 DIAGNOSIS — M62551 Muscle wasting and atrophy, not elsewhere classified, right thigh: Secondary | ICD-10-CM | POA: Diagnosis not present

## 2023-09-18 DIAGNOSIS — R4789 Other speech disturbances: Secondary | ICD-10-CM | POA: Diagnosis not present

## 2023-09-18 DIAGNOSIS — N3946 Mixed incontinence: Secondary | ICD-10-CM | POA: Diagnosis not present

## 2023-09-18 DIAGNOSIS — M62552 Muscle wasting and atrophy, not elsewhere classified, left thigh: Secondary | ICD-10-CM | POA: Diagnosis not present

## 2023-09-18 DIAGNOSIS — R498 Other voice and resonance disorders: Secondary | ICD-10-CM | POA: Diagnosis not present

## 2023-09-19 ENCOUNTER — Encounter (INDEPENDENT_AMBULATORY_CARE_PROVIDER_SITE_OTHER): Payer: Medicare Other | Admitting: Ophthalmology

## 2023-09-19 DIAGNOSIS — R498 Other voice and resonance disorders: Secondary | ICD-10-CM | POA: Diagnosis not present

## 2023-09-19 DIAGNOSIS — M62551 Muscle wasting and atrophy, not elsewhere classified, right thigh: Secondary | ICD-10-CM | POA: Diagnosis not present

## 2023-09-19 DIAGNOSIS — N3946 Mixed incontinence: Secondary | ICD-10-CM | POA: Diagnosis not present

## 2023-09-19 DIAGNOSIS — M62552 Muscle wasting and atrophy, not elsewhere classified, left thigh: Secondary | ICD-10-CM | POA: Diagnosis not present

## 2023-09-19 DIAGNOSIS — R2681 Unsteadiness on feet: Secondary | ICD-10-CM | POA: Diagnosis not present

## 2023-09-19 DIAGNOSIS — R4789 Other speech disturbances: Secondary | ICD-10-CM | POA: Diagnosis not present

## 2023-09-20 DIAGNOSIS — M62552 Muscle wasting and atrophy, not elsewhere classified, left thigh: Secondary | ICD-10-CM | POA: Diagnosis not present

## 2023-09-20 DIAGNOSIS — R498 Other voice and resonance disorders: Secondary | ICD-10-CM | POA: Diagnosis not present

## 2023-09-20 DIAGNOSIS — R2681 Unsteadiness on feet: Secondary | ICD-10-CM | POA: Diagnosis not present

## 2023-09-20 DIAGNOSIS — N3946 Mixed incontinence: Secondary | ICD-10-CM | POA: Diagnosis not present

## 2023-09-20 DIAGNOSIS — M62551 Muscle wasting and atrophy, not elsewhere classified, right thigh: Secondary | ICD-10-CM | POA: Diagnosis not present

## 2023-09-20 DIAGNOSIS — R4789 Other speech disturbances: Secondary | ICD-10-CM | POA: Diagnosis not present

## 2023-09-24 DIAGNOSIS — R488 Other symbolic dysfunctions: Secondary | ICD-10-CM | POA: Diagnosis not present

## 2023-09-24 DIAGNOSIS — R41841 Cognitive communication deficit: Secondary | ICD-10-CM | POA: Diagnosis not present

## 2023-09-24 DIAGNOSIS — N3946 Mixed incontinence: Secondary | ICD-10-CM | POA: Diagnosis not present

## 2023-09-24 DIAGNOSIS — R2681 Unsteadiness on feet: Secondary | ICD-10-CM | POA: Diagnosis not present

## 2023-09-24 DIAGNOSIS — R2689 Other abnormalities of gait and mobility: Secondary | ICD-10-CM | POA: Diagnosis not present

## 2023-09-24 DIAGNOSIS — M62552 Muscle wasting and atrophy, not elsewhere classified, left thigh: Secondary | ICD-10-CM | POA: Diagnosis not present

## 2023-09-24 DIAGNOSIS — R4789 Other speech disturbances: Secondary | ICD-10-CM | POA: Diagnosis not present

## 2023-09-24 DIAGNOSIS — M62551 Muscle wasting and atrophy, not elsewhere classified, right thigh: Secondary | ICD-10-CM | POA: Diagnosis not present

## 2023-09-24 DIAGNOSIS — R4185 Anosognosia: Secondary | ICD-10-CM | POA: Diagnosis not present

## 2023-09-24 DIAGNOSIS — G20A1 Parkinson's disease without dyskinesia, without mention of fluctuations: Secondary | ICD-10-CM | POA: Diagnosis not present

## 2023-09-24 DIAGNOSIS — R498 Other voice and resonance disorders: Secondary | ICD-10-CM | POA: Diagnosis not present

## 2023-09-25 DIAGNOSIS — M62552 Muscle wasting and atrophy, not elsewhere classified, left thigh: Secondary | ICD-10-CM | POA: Diagnosis not present

## 2023-09-25 DIAGNOSIS — R4789 Other speech disturbances: Secondary | ICD-10-CM | POA: Diagnosis not present

## 2023-09-25 DIAGNOSIS — M62551 Muscle wasting and atrophy, not elsewhere classified, right thigh: Secondary | ICD-10-CM | POA: Diagnosis not present

## 2023-09-25 DIAGNOSIS — N3946 Mixed incontinence: Secondary | ICD-10-CM | POA: Diagnosis not present

## 2023-09-25 DIAGNOSIS — R2681 Unsteadiness on feet: Secondary | ICD-10-CM | POA: Diagnosis not present

## 2023-09-25 DIAGNOSIS — R498 Other voice and resonance disorders: Secondary | ICD-10-CM | POA: Diagnosis not present

## 2023-09-26 DIAGNOSIS — R4789 Other speech disturbances: Secondary | ICD-10-CM | POA: Diagnosis not present

## 2023-09-26 DIAGNOSIS — N3946 Mixed incontinence: Secondary | ICD-10-CM | POA: Diagnosis not present

## 2023-09-26 DIAGNOSIS — M62552 Muscle wasting and atrophy, not elsewhere classified, left thigh: Secondary | ICD-10-CM | POA: Diagnosis not present

## 2023-09-26 DIAGNOSIS — R498 Other voice and resonance disorders: Secondary | ICD-10-CM | POA: Diagnosis not present

## 2023-09-26 DIAGNOSIS — M62551 Muscle wasting and atrophy, not elsewhere classified, right thigh: Secondary | ICD-10-CM | POA: Diagnosis not present

## 2023-09-26 DIAGNOSIS — R2681 Unsteadiness on feet: Secondary | ICD-10-CM | POA: Diagnosis not present

## 2023-09-27 DIAGNOSIS — R498 Other voice and resonance disorders: Secondary | ICD-10-CM | POA: Diagnosis not present

## 2023-09-27 DIAGNOSIS — M62551 Muscle wasting and atrophy, not elsewhere classified, right thigh: Secondary | ICD-10-CM | POA: Diagnosis not present

## 2023-09-27 DIAGNOSIS — R2681 Unsteadiness on feet: Secondary | ICD-10-CM | POA: Diagnosis not present

## 2023-09-27 DIAGNOSIS — R4789 Other speech disturbances: Secondary | ICD-10-CM | POA: Diagnosis not present

## 2023-09-27 DIAGNOSIS — M62552 Muscle wasting and atrophy, not elsewhere classified, left thigh: Secondary | ICD-10-CM | POA: Diagnosis not present

## 2023-09-27 DIAGNOSIS — N3946 Mixed incontinence: Secondary | ICD-10-CM | POA: Diagnosis not present

## 2023-09-28 DIAGNOSIS — R498 Other voice and resonance disorders: Secondary | ICD-10-CM | POA: Diagnosis not present

## 2023-09-28 DIAGNOSIS — M62551 Muscle wasting and atrophy, not elsewhere classified, right thigh: Secondary | ICD-10-CM | POA: Diagnosis not present

## 2023-09-28 DIAGNOSIS — N3946 Mixed incontinence: Secondary | ICD-10-CM | POA: Diagnosis not present

## 2023-09-28 DIAGNOSIS — R4789 Other speech disturbances: Secondary | ICD-10-CM | POA: Diagnosis not present

## 2023-09-28 DIAGNOSIS — M62552 Muscle wasting and atrophy, not elsewhere classified, left thigh: Secondary | ICD-10-CM | POA: Diagnosis not present

## 2023-09-28 DIAGNOSIS — R2681 Unsteadiness on feet: Secondary | ICD-10-CM | POA: Diagnosis not present

## 2023-09-29 ENCOUNTER — Emergency Department (HOSPITAL_COMMUNITY)

## 2023-09-29 ENCOUNTER — Other Ambulatory Visit: Payer: Self-pay

## 2023-09-29 ENCOUNTER — Encounter (HOSPITAL_COMMUNITY): Payer: Self-pay

## 2023-09-29 ENCOUNTER — Emergency Department (HOSPITAL_COMMUNITY)
Admission: EM | Admit: 2023-09-29 | Discharge: 2023-09-29 | Disposition: A | Discharge status: Home / Self Care | Attending: Emergency Medicine | Admitting: Emergency Medicine

## 2023-09-29 DIAGNOSIS — W19XXXA Unspecified fall, initial encounter: Secondary | ICD-10-CM

## 2023-09-29 DIAGNOSIS — I6529 Occlusion and stenosis of unspecified carotid artery: Secondary | ICD-10-CM | POA: Diagnosis not present

## 2023-09-29 DIAGNOSIS — Y9301 Activity, walking, marching and hiking: Secondary | ICD-10-CM | POA: Insufficient documentation

## 2023-09-29 DIAGNOSIS — S51011A Laceration without foreign body of right elbow, initial encounter: Secondary | ICD-10-CM | POA: Insufficient documentation

## 2023-09-29 DIAGNOSIS — Z79899 Other long term (current) drug therapy: Secondary | ICD-10-CM | POA: Insufficient documentation

## 2023-09-29 DIAGNOSIS — I951 Orthostatic hypotension: Secondary | ICD-10-CM | POA: Diagnosis not present

## 2023-09-29 DIAGNOSIS — G20C Parkinsonism, unspecified: Secondary | ICD-10-CM | POA: Insufficient documentation

## 2023-09-29 DIAGNOSIS — S60212A Contusion of left wrist, initial encounter: Secondary | ICD-10-CM | POA: Diagnosis not present

## 2023-09-29 DIAGNOSIS — Z7982 Long term (current) use of aspirin: Secondary | ICD-10-CM | POA: Diagnosis not present

## 2023-09-29 DIAGNOSIS — I10 Essential (primary) hypertension: Secondary | ICD-10-CM | POA: Diagnosis not present

## 2023-09-29 DIAGNOSIS — W1830XA Fall on same level, unspecified, initial encounter: Secondary | ICD-10-CM | POA: Diagnosis not present

## 2023-09-29 DIAGNOSIS — R220 Localized swelling, mass and lump, head: Secondary | ICD-10-CM | POA: Diagnosis not present

## 2023-09-29 DIAGNOSIS — S0990XA Unspecified injury of head, initial encounter: Secondary | ICD-10-CM | POA: Diagnosis not present

## 2023-09-29 DIAGNOSIS — S0003XA Contusion of scalp, initial encounter: Secondary | ICD-10-CM | POA: Diagnosis not present

## 2023-09-29 DIAGNOSIS — S01112A Laceration without foreign body of left eyelid and periocular area, initial encounter: Secondary | ICD-10-CM | POA: Diagnosis not present

## 2023-09-29 DIAGNOSIS — M50322 Other cervical disc degeneration at C5-C6 level: Secondary | ICD-10-CM | POA: Diagnosis not present

## 2023-09-29 DIAGNOSIS — H5712 Ocular pain, left eye: Secondary | ICD-10-CM | POA: Diagnosis present

## 2023-09-29 DIAGNOSIS — S0012XA Contusion of left eyelid and periocular area, initial encounter: Secondary | ICD-10-CM

## 2023-09-29 DIAGNOSIS — Z043 Encounter for examination and observation following other accident: Secondary | ICD-10-CM | POA: Diagnosis not present

## 2023-09-29 DIAGNOSIS — R42 Dizziness and giddiness: Secondary | ICD-10-CM | POA: Diagnosis not present

## 2023-09-29 LAB — COMPREHENSIVE METABOLIC PANEL
ALT: 13 U/L (ref 0–44)
AST: 25 U/L (ref 15–41)
Albumin: 3.8 g/dL (ref 3.5–5.0)
Alkaline Phosphatase: 62 U/L (ref 38–126)
Anion gap: 15 (ref 5–15)
BUN: 23 mg/dL (ref 8–23)
CO2: 23 mmol/L (ref 22–32)
Calcium: 9.6 mg/dL (ref 8.9–10.3)
Chloride: 103 mmol/L (ref 98–111)
Creatinine, Ser: 1.11 mg/dL (ref 0.61–1.24)
GFR, Estimated: >60 mL/min (ref >60)
Glucose, Bld: 132 mg/dL — ABNORMAL HIGH (ref 70–99)
Potassium: 3.7 mmol/L (ref 3.5–5.1)
Sodium: 141 mmol/L (ref 135–145)
Total Bilirubin: 1.3 mg/dL — ABNORMAL HIGH (ref 0.0–1.2)
Total Protein: 6.6 g/dL (ref 6.5–8.1)

## 2023-09-29 LAB — CBC WITH DIFFERENTIAL/PLATELET
Abs Immature Granulocytes: 0.06 10*3/uL (ref 0.00–0.07)
Basophils Absolute: 0 10*3/uL (ref 0.0–0.1)
Basophils Relative: 0 %
Eosinophils Absolute: 0.2 10*3/uL (ref 0.0–0.5)
Eosinophils Relative: 2 %
HCT: 39.6 % (ref 39.0–52.0)
Hemoglobin: 13.3 g/dL (ref 13.0–17.0)
Immature Granulocytes: 1 %
Lymphocytes Relative: 9 %
Lymphs Abs: 1.1 10*3/uL (ref 0.7–4.0)
MCH: 31.5 pg (ref 26.0–34.0)
MCHC: 33.6 g/dL (ref 30.0–36.0)
MCV: 93.8 fL (ref 80.0–100.0)
Monocytes Absolute: 1.1 10*3/uL — ABNORMAL HIGH (ref 0.1–1.0)
Monocytes Relative: 9 %
Neutro Abs: 10.3 10*3/uL — ABNORMAL HIGH (ref 1.7–7.7)
Neutrophils Relative %: 79 %
Platelets: 159 10*3/uL (ref 150–400)
RBC: 4.22 MIL/uL (ref 4.22–5.81)
RDW: 12.6 % (ref 11.5–15.5)
WBC: 12.8 10*3/uL — ABNORMAL HIGH (ref 4.0–10.5)
nRBC: 0 % (ref 0.0–0.2)

## 2023-09-29 LAB — CBG MONITORING, ED: Glucose-Capillary: 109 mg/dL — ABNORMAL HIGH (ref 70–99)

## 2023-09-29 LAB — RESP PANEL BY RT-PCR (RSV, FLU A&B, COVID)  RVPGX2
Influenza A by PCR: NEGATIVE
Influenza B by PCR: NEGATIVE
Resp Syncytial Virus by PCR: NEGATIVE
SARS Coronavirus 2 by RT PCR: NEGATIVE

## 2023-09-29 MED ORDER — SODIUM CHLORIDE 0.9 % IV BOLUS
1000.0000 mL | Freq: Once | INTRAVENOUS | Status: AC
  Administered 2023-09-29: 1000 mL via INTRAVENOUS

## 2023-09-29 MED ORDER — LIDOCAINE HCL (PF) 1 % IJ SOLN
5.0000 mL | Freq: Once | INTRAMUSCULAR | Status: AC
  Administered 2023-09-29: 5 mL
  Filled 2023-09-29: qty 5

## 2023-09-29 NOTE — ED Triage Notes (Signed)
 Pt bib PTAR from [facility] for a witnessed fall by his wife. Pt states he was walking into another room when he felt dizzy and fell. Pt reports LOC. Hematoma left eye, skin tears to both arms. Pt c.o lower back pain. C collar in place. Pt a.o VSS. No blood thinners

## 2023-09-29 NOTE — Discharge Instructions (Signed)
 You were seen in the emergency department after your fall.  Your workup showed no signs of injury or broken bones.  You did have a cut to your eyebrow and have 6 stitches in place.  These will need to be removed in 3 to 5 days either by your primary doctor, urgent care or in the ER.  You can ice your forehead and keep your head elevated to help with the swelling and take Tylenol as needed for pain.  Your blood pressure did drop significantly when you went from sitting to standing and should wear your compression stockings and take your time when you are switching positions at home.  You can follow-up with your primary doctor or neurologist to have the symptoms rechecked to see if you need any further medication changes.  You should return to the emergency department if you have pus draining from your wound, fevers, severe headaches or any other new or concerning symptoms.

## 2023-09-29 NOTE — ED Notes (Signed)
 Patient transported to CT

## 2023-09-29 NOTE — ED Provider Notes (Signed)
 [place] EMERGENCY DEPARTMENT AT [facility] Provider Note   CSN: [id number] Arrival date & time: 09/29/23  0745     History  Chief Complaint  Patient presents with   Seth Carter is a 83 y.o. male.  Patient is an 83 year old male with a past medical history of Parkinson's and hypertension presenting to the emergency department after a fall.  Patient states that he got up this morning and walking to another room when he became dizzy where he felt off balance and fell and hit the left side of his face.  He thinks he may have briefly lost consciousness.  He states that he is having some mild pain around the left eye where he fell but denies any headache.  He denies any nausea or vomiting.  He denied any chest pain or shortness of breath prior to the fall.  He states that he initially had some pain in his low back after the fall but that has essentially resolved.  He denies any numbness or weakness.  He states that his tetanus is up-to-date.  The patient states that he has been sick with a cold for the last several days with congestion but denies any fever or cough.  He denies any recent vomiting or diarrhea. He denies any blood thinners.  The history is provided by the patient.  Fall       Home Medications Prior to Admission medications   Medication Sig Start Date End Date Taking? Authorizing Provider  aspirin EC 81 MG tablet Take 81 mg by mouth at bedtime.    [provider]  carbidopa-levodopa (SINEMET IR) 25-100 MG tablet Take 2 tablets by mouth 3 (three) times daily.    [provider]  cetirizine (ZYRTEC) 10 MG tablet Take 10 mg by mouth daily.    [provider]  Cyanocobalamin (VITAMIN B-12 PO) Take 1 tablet by mouth daily with lunch.    [provider]  dorzolamide-timolol (COSOPT) 22.3-6.8 MG/ML ophthalmic solution Place 1 drop into both eyes 2 (two) times daily.    [provider]  fludrocortisone (FLORINEF) 0.1  MG tablet Take 1 tablet (0.1 mg total) by mouth every other day. 01/01/23   [name], MD  latanoprost (XALATAN) 0.005 % ophthalmic solution Place 1 drop into both eyes at bedtime.    [provider]  MAGNESIUM OXIDE PO Take 1 tablet by mouth at bedtime.    [provider]  Multiple Vitamin (MULTIVITAMIN WITH MINERALS) TABS tablet Take 1 tablet by mouth daily.    [provider]  Multiple Vitamins-Minerals (PRESERVISION/LUTEIN) CAPS Take 1 capsule by mouth daily with lunch.    [provider]  rosuvastatin (CRESTOR) 10 MG tablet Take 10 mg by mouth at bedtime.    [provider]      Allergies    Cialis [tadalafil], Pneumovax [pneumococcal polysaccharide vaccine], Zocor [simvastatin], Ace inhibitors, and Contrast media [iodinated contrast media]    Review of Systems   Review of Systems  Physical Exam Updated Vital Signs BP (!) 149/78   Pulse 61   Temp 97.6 F (36.4 C) (Oral)   Resp (!) 22   SpO2 100%  Physical Exam Vitals and nursing note reviewed.  Constitutional:      General: He is not in acute distress.    Appearance: Normal appearance.  HENT:     Head: Normocephalic.     Comments: Hematoma to L eyebrow with ~2 cm non-bleeding laceration  Nose: Nose normal.     Mouth/Throat:     Mouth: Mucous membranes are moist.     Pharynx: Oropharynx is clear.  Eyes:     Extraocular Movements: Extraocular movements intact.     Conjunctiva/sclera: Conjunctivae normal.     Pupils: Pupils are equal, round, and reactive to light.     Comments: Mild L eye contusion and periorbital swelling  Neck:     Comments: No midline neck tenderness, c-collar in place Cardiovascular:     Rate and Rhythm: Normal rate and regular rhythm.     Heart sounds: Normal heart sounds.  Pulmonary:     Effort: Pulmonary effort is normal.     Breath sounds: Normal breath sounds.  Abdominal:     General: Abdomen is flat.     Palpations: Abdomen is soft.      Tenderness: There is no abdominal tenderness.  Musculoskeletal:        General: Normal range of motion.     Comments: No midline back tenderness No bony tenderness of bilateral upper or lower extremities Pelvis stable, nontender  Skin:    General: Skin is warm and dry.     Comments: Approximately 2 cm nonbleeding skin tear to right elbow with surrounding contusion, approximately 0.5 cm nonbleeding abrasion to left lateral wrist with surrounding contusion  Neurological:     General: No focal deficit present.     Mental Status: He is alert and oriented to person, place, and time.     Cranial Nerves: No cranial nerve deficit.     Sensory: No sensory deficit.     Motor: No weakness.     Coordination: Coordination normal.  Psychiatric:        Mood and Affect: Mood normal.        Behavior: Behavior normal.     ED Results / Procedures / Treatments   Labs (all labs ordered are listed, but only abnormal results are displayed) Labs Reviewed  CBC WITH DIFFERENTIAL/PLATELET - Abnormal; Notable for the following components:      Result Value   WBC 12.8 (*)    Neutro Abs 10.3 (*)    Monocytes Absolute 1.1 (*)    All other components within normal limits  COMPREHENSIVE METABOLIC PANEL - Abnormal; Notable for the following components:   Glucose, Bld 132 (*)    Total Bilirubin 1.3 (*)    All other components within normal limits  CBG MONITORING, ED - Abnormal; Notable for the following components:   Glucose-Capillary 109 (*)    All other components within normal limits  RESP PANEL BY RT-PCR (RSV, FLU A&B, COVID)  [id number]    EKG EKG Interpretation Date/Time:  Saturday September 29 2023 07:49:20 EST Ventricular Rate:  58 PR Interval:    QRS Duration:  101 QT Interval:  423 QTC Calculation: 416 R Axis:   23  Text Interpretation: Normal sinus rhythm Borderline repolarization abnormality Interpretation limited secondary to artifact Otherwise no significant change Confirmed by Elayne Snare ([id number]) on 09/29/2023 7:50:59 AM  Radiology CT Cervical Spine Wo Contrast Result Date: 09/29/2023 CLINICAL DATA:  83 year old male status post witnessed fall. Dizziness, atrial fibrillation. EXAM: CT CERVICAL SPINE WITHOUT CONTRAST TECHNIQUE: Multidetector CT imaging of the cervical spine was performed without intravenous contrast. Multiplanar CT image reconstructions were also generated. RADIATION DOSE REDUCTION: This exam was performed according to the departmental dose-optimization program which includes automated exposure control, adjustment of the mA and/or kV according to patient size and/or use of iterative reconstruction  technique. COMPARISON:  CT head and face today reported separately. CT cervical spine 04/08/2023. FINDINGS: Alignment: Stable cervical lordosis. Cervicothoracic junction alignment is within normal limits. Bilateral posterior element alignment is within normal limits. Skull base and vertebrae: Visualized skull base is intact. No atlanto-occipital dissociation. C1 and C2 appear intact and aligned. No acute osseous abnormality identified. Soft tissues and spinal canal: No prevertebral fluid or swelling. No visible canal hematoma. Calcified cervical carotid atherosclerosis. Disc levels: Cervical spine degeneration superimposed on degenerative appearing ankylosis of C3-C4. Bulky facet degeneration on the right at C2-C3. Bulky chronic disc and endplate degeneration at C5-C6 and C6-C7. Upper chest: Visible upper thoracic levels appears stable. Negative lung apices. IMPRESSION: 1. No acute traumatic injury identified in the cervical spine. 2. Stable chronic cervical spine degeneration superimposed on C3-C4 ankylosis. Electronically Signed   By: Odessa Fleming M.D.   On: 09/29/2023 10:05   CT Maxillofacial Wo Contrast Result Date: 09/29/2023 CLINICAL DATA:  83 year old male status post witnessed fall. Dizziness, atrial fibrillation. EXAM: CT MAXILLOFACIAL WITHOUT CONTRAST TECHNIQUE: Multidetector  CT imaging of the maxillofacial structures was performed. Multiplanar CT image reconstructions were also generated. RADIATION DOSE REDUCTION: This exam was performed according to the departmental dose-optimization program which includes automated exposure control, adjustment of the mA and/or kV according to patient size and/or use of iterative reconstruction technique. COMPARISON:  Head and cervical spine CT today. FINDINGS: Osseous: Mandible intact and normally located. Bilateral maxilla, zygoma, pterygoid, nasal bones intact. Central skull base appears intact. Orbits: No orbital wall fracture. Postoperative changes to the globes. Globes and intraorbital soft tissues appear intact, normal. Broad-based left periorbital scalp hematoma and/or contusion also involving the face and forehead. No soft tissue gas identified. Sinuses: No layering sinus hemorrhage. Mild bilateral paranasal sinus mucosal thickening. Tympanic cavities and mastoids are well aerated. Soft tissues: Negative visible noncontrast deep soft tissue spaces of the face. Calcified cervical carotid atherosclerosis. Limited intracranial: Stable to that reported separately. IMPRESSION: Left periorbital and face/scalp hematoma, contusion. No face or orbit fracture identified. Electronically Signed   By: Odessa Fleming M.D.   On: 09/29/2023 10:00   CT Head Wo Contrast Result Date: 09/29/2023 CLINICAL DATA:  83 year old male status post witnessed fall. Dizziness, atrial fibrillation. EXAM: CT HEAD WITHOUT CONTRAST TECHNIQUE: Contiguous axial images were obtained from the base of the skull through the vertex without intravenous contrast. RADIATION DOSE REDUCTION: This exam was performed according to the departmental dose-optimization program which includes automated exposure control, adjustment of the mA and/or kV according to patient size and/or use of iterative reconstruction technique. COMPARISON:  CT face and cervical spine today reported separately. Brain MRI  01/11/2023. Head CT 04/08/2023. FINDINGS: Brain: Cerebral volume is stable and normal for age. No midline shift, ventriculomegaly, mass effect, evidence of mass lesion, intracranial hemorrhage or evidence of cortically based acute infarction. Gray-white matter differentiation stable, mild for age periventricular white matter hypodensity. Vascular: Calcified atherosclerosis at the skull base. No suspicious intracranial vascular hyperdensity. Skull: Stable and intact.  No fracture identified. Sinuses/Orbits: Visualized paranasal sinuses and mastoids are stable and well aerated. Other: Left periorbital, lateral face and scalp hematoma and/or contusion. Soft tissue swelling there up to 12 mm. Left globe appears intact. Face reported separately. Underlying bone appears stable. IMPRESSION: 1. Left periorbital, lateral face and scalp soft tissue injury. Face CT reported separately. 2. No acute intracranial abnormality. Stable and negative for age non contrast CT appearance of the brain. Electronically Signed   By: Odessa Fleming M.D.   On:  09/29/2023 09:57   DG Pelvis Portable Result Date: 09/29/2023 CLINICAL DATA:  Fall. EXAM: PORTABLE PELVIS 1-2 VIEWS COMPARISON:  None Available. FINDINGS: No evidence for an acute fracture. SI joints and symphysis pubis unremarkable. No findings to suggest femoral head dislocation. IMPRESSION: Negative. Electronically Signed   By: Kennith Center M.D.   On: 09/29/2023 08:30   DG Chest Port 1 View Result Date: 09/29/2023 CLINICAL DATA:  Witnessed fall.  Dizziness. EXAM: PORTABLE CHEST 1 VIEW COMPARISON:  04/08/2023 FINDINGS: Low volume film. The lungs are clear without focal pneumonia, edema, pneumothorax or pleural effusion. Elongated nodular density projects over the anterior right fourth rib is been seen on multiple studies back to 04/25/2022. This is probably a benign rib lesion such as bone island. The cardiopericardial silhouette is within normal limits for size. Degenerative changes are  noted in both shoulders, left greater than right. No acute bony abnormality. IMPRESSION: Low volume film without acute cardiopulmonary findings. Electronically Signed   By: Kennith Center M.D.   On: 09/29/2023 08:30    Procedures .Laceration Repair  Date/Time: 09/29/2023 10:01 AM  Performed by: Rexford Maus, DO Authorized by: Rexford Maus, DO   Consent:    Consent obtained:  Verbal   Consent given by:  Patient   Risks, benefits, and alternatives were discussed: yes     Risks discussed:  Infection, pain, retained foreign body, need for additional repair, poor cosmetic result, poor wound healing and nerve damage   Alternatives discussed:  No treatment and delayed treatment Universal protocol:    Procedure explained and questions answered to patient or proxy's satisfaction: yes     Patient identity confirmed:  Verbally with patient Anesthesia:    Anesthesia method:  Local infiltration   Local anesthetic:  Lidocaine 1% w/o epi Laceration details:    Location:  Face   Face location:  L eyebrow   Length (cm):  3   Depth (mm):  3 Pre-procedure details:    Preparation:  Imaging obtained to evaluate for foreign bodies Exploration:    Limited defect created (wound extended): yes     Hemostasis achieved with:  Direct pressure   Imaging obtained comment:  CTH   Imaging outcome: foreign body not noted     Wound exploration: wound explored through full range of motion and entire depth of wound visualized     Wound extent: areolar tissue not violated, fascia not violated, no foreign body, no signs of injury, no nerve damage, no tendon damage, no underlying fracture and no vascular damage     Contaminated: no   Treatment:    Area cleansed with:  Saline   Amount of cleaning:  Standard   Irrigation solution:  Sterile saline   Irrigation method:  Pressure wash   Debridement:  None   Undermining:  None   Scar revision: no   Skin repair:    Repair method:  Sutures   Suture  size:  5-0   Suture material:  Prolene   Suture technique:  Simple interrupted   Number of sutures:  6 Approximation:    Approximation:  Close Repair type:    Repair type:  Simple Post-procedure details:    Dressing:  Open (no dressing)   Procedure completion:  Tolerated well, no immediate complications     Medications Ordered in ED Medications  lidocaine (PF) (XYLOCAINE) 1 % injection 5 mL (5 mLs Infiltration Given 09/29/23 0836)  sodium chloride 0.9 % bolus 1,000 mL (1,000 mLs Intravenous New Bag/Given 09/29/23  1105)    ED Course/ Medical Decision Making/ A&P Clinical Course as of 09/29/23 1230  Sat Sep 29, 2023  1056 Orthostatics positive. Will give fluid bolus and will be recommended compression stockings. No acute traumatic injury on CT imaging, labs otherwise within normal range. [VK]    Clinical Course User Index [VK] Rexford Maus, DO                                 Medical Decision Making This patient presents to the ED with chief complaint(s) of fall with pertinent past medical history of Parkinson's, hypertension which further complicates the presenting complaint. The complaint involves an extensive differential diagnosis and also carries with it a high risk of complications and morbidity.    The differential diagnosis includes due to patient's age and head injury concern for ICH, mass effect, cervical spine fracture, possible pelvic fracture or sacral fracture, skin tears, laceration, arrhythmia, anemia, dehydration, electrolyte abnormality, viral syndrome, orthostatic hypotension  Additional history obtained: Additional history obtained from EMS  Records reviewed Care Everywhere/External Records  ED Course and Reassessment: On patient's arrival he is hemodynamically stable in no acute distress, was placed in c-collar and round.  Due to patient's age with evidence of head trauma will have head CT and C-spine performed.  Will require laceration repair to his  eyebrow.  The patient reports his tetanus is up-to-date and does not require an update today.  EKG on arrival showed normal sinus rhythm without acute ischemic changes and Accu-Chek on arrival is normal.  Will additionally have labs and COVID swab and orthostatics to evaluate for cause of his dizziness, though and recent neurology records seems this has been an ongoing issue with his Parkinson's.  He declined any pain control at this time.  He will be closely reassessed.  Independent labs interpretation:  The following labs were independently interpreted: within normal range  Independent visualization of imaging: - I independently visualized the following imaging with scope of interpretation limited to determining acute life threatening conditions related to emergency care: CTH/C-spine, CT max/face, CXR, pelvis XR, which revealed no acute traumatic injury  Consultation: - Consulted or discussed management/test interpretation w/ external professional: N/A  Consideration for admission or further workup: Patient has no emergent conditions requiring admission or further work-up at this time and is stable for discharge home with primary care follow-up  Social Determinants of health: N/A    Amount and/or Complexity of Data Reviewed Labs: ordered. Radiology: ordered.  Risk Prescription drug management.          Final Clinical Impression(s) / ED Diagnoses Final diagnoses:  Fall, initial encounter  Traumatic hematoma of left eyebrow, initial encounter  Laceration of left eyebrow, initial encounter  Orthostatic hypotension    Rx / DC Orders ED Discharge Orders     None         Rexford Maus, DO 09/29/23 1230

## 2023-10-01 DIAGNOSIS — R2681 Unsteadiness on feet: Secondary | ICD-10-CM | POA: Diagnosis not present

## 2023-10-01 DIAGNOSIS — R498 Other voice and resonance disorders: Secondary | ICD-10-CM | POA: Diagnosis not present

## 2023-10-01 DIAGNOSIS — M62552 Muscle wasting and atrophy, not elsewhere classified, left thigh: Secondary | ICD-10-CM | POA: Diagnosis not present

## 2023-10-01 DIAGNOSIS — M62551 Muscle wasting and atrophy, not elsewhere classified, right thigh: Secondary | ICD-10-CM | POA: Diagnosis not present

## 2023-10-01 DIAGNOSIS — N3946 Mixed incontinence: Secondary | ICD-10-CM | POA: Diagnosis not present

## 2023-10-01 DIAGNOSIS — R4789 Other speech disturbances: Secondary | ICD-10-CM | POA: Diagnosis not present

## 2023-10-02 DIAGNOSIS — M62552 Muscle wasting and atrophy, not elsewhere classified, left thigh: Secondary | ICD-10-CM | POA: Diagnosis not present

## 2023-10-02 DIAGNOSIS — R2681 Unsteadiness on feet: Secondary | ICD-10-CM | POA: Diagnosis not present

## 2023-10-02 DIAGNOSIS — M62551 Muscle wasting and atrophy, not elsewhere classified, right thigh: Secondary | ICD-10-CM | POA: Diagnosis not present

## 2023-10-02 DIAGNOSIS — R4789 Other speech disturbances: Secondary | ICD-10-CM | POA: Diagnosis not present

## 2023-10-02 DIAGNOSIS — R498 Other voice and resonance disorders: Secondary | ICD-10-CM | POA: Diagnosis not present

## 2023-10-02 DIAGNOSIS — N3946 Mixed incontinence: Secondary | ICD-10-CM | POA: Diagnosis not present

## 2023-10-04 DIAGNOSIS — M62551 Muscle wasting and atrophy, not elsewhere classified, right thigh: Secondary | ICD-10-CM | POA: Diagnosis not present

## 2023-10-04 DIAGNOSIS — N3946 Mixed incontinence: Secondary | ICD-10-CM | POA: Diagnosis not present

## 2023-10-04 DIAGNOSIS — R2681 Unsteadiness on feet: Secondary | ICD-10-CM | POA: Diagnosis not present

## 2023-10-04 DIAGNOSIS — M62552 Muscle wasting and atrophy, not elsewhere classified, left thigh: Secondary | ICD-10-CM | POA: Diagnosis not present

## 2023-10-04 DIAGNOSIS — R498 Other voice and resonance disorders: Secondary | ICD-10-CM | POA: Diagnosis not present

## 2023-10-04 DIAGNOSIS — R4789 Other speech disturbances: Secondary | ICD-10-CM | POA: Diagnosis not present

## 2023-10-05 ENCOUNTER — Encounter (HOSPITAL_COMMUNITY): Payer: Self-pay | Admitting: Emergency Medicine

## 2023-10-05 ENCOUNTER — Emergency Department (HOSPITAL_COMMUNITY)
Admission: EM | Admit: 2023-10-05 | Discharge: 2023-10-05 | Disposition: A | Discharge status: Home / Self Care | Attending: Emergency Medicine | Admitting: Emergency Medicine

## 2023-10-05 ENCOUNTER — Emergency Department (HOSPITAL_COMMUNITY)

## 2023-10-05 ENCOUNTER — Other Ambulatory Visit: Payer: Self-pay

## 2023-10-05 DIAGNOSIS — S29001A Unspecified injury of muscle and tendon of front wall of thorax, initial encounter: Secondary | ICD-10-CM | POA: Insufficient documentation

## 2023-10-05 DIAGNOSIS — S0990XA Unspecified injury of head, initial encounter: Secondary | ICD-10-CM | POA: Diagnosis present

## 2023-10-05 DIAGNOSIS — S298XXA Other specified injuries of thorax, initial encounter: Secondary | ICD-10-CM

## 2023-10-05 DIAGNOSIS — S060X0A Concussion without loss of consciousness, initial encounter: Secondary | ICD-10-CM | POA: Diagnosis not present

## 2023-10-05 DIAGNOSIS — S161XXA Strain of muscle, fascia and tendon at neck level, initial encounter: Secondary | ICD-10-CM | POA: Insufficient documentation

## 2023-10-05 DIAGNOSIS — I10 Essential (primary) hypertension: Secondary | ICD-10-CM | POA: Diagnosis not present

## 2023-10-05 DIAGNOSIS — R4789 Other speech disturbances: Secondary | ICD-10-CM | POA: Diagnosis not present

## 2023-10-05 DIAGNOSIS — D72829 Elevated white blood cell count, unspecified: Secondary | ICD-10-CM | POA: Insufficient documentation

## 2023-10-05 DIAGNOSIS — R569 Unspecified convulsions: Secondary | ICD-10-CM | POA: Diagnosis not present

## 2023-10-05 DIAGNOSIS — W19XXXA Unspecified fall, initial encounter: Secondary | ICD-10-CM

## 2023-10-05 DIAGNOSIS — S22029A Unspecified fracture of second thoracic vertebra, initial encounter for closed fracture: Secondary | ICD-10-CM | POA: Diagnosis not present

## 2023-10-05 DIAGNOSIS — N3946 Mixed incontinence: Secondary | ICD-10-CM | POA: Diagnosis not present

## 2023-10-05 DIAGNOSIS — W01198A Fall on same level from slipping, tripping and stumbling with subsequent striking against other object, initial encounter: Secondary | ICD-10-CM | POA: Diagnosis not present

## 2023-10-05 DIAGNOSIS — R220 Localized swelling, mass and lump, head: Secondary | ICD-10-CM | POA: Diagnosis not present

## 2023-10-05 DIAGNOSIS — S060XAA Concussion with loss of consciousness status unknown, initial encounter: Secondary | ICD-10-CM | POA: Insufficient documentation

## 2023-10-05 DIAGNOSIS — G20C Parkinsonism, unspecified: Secondary | ICD-10-CM | POA: Diagnosis not present

## 2023-10-05 DIAGNOSIS — M62551 Muscle wasting and atrophy, not elsewhere classified, right thigh: Secondary | ICD-10-CM | POA: Diagnosis not present

## 2023-10-05 DIAGNOSIS — R2681 Unsteadiness on feet: Secondary | ICD-10-CM | POA: Diagnosis not present

## 2023-10-05 DIAGNOSIS — M62552 Muscle wasting and atrophy, not elsewhere classified, left thigh: Secondary | ICD-10-CM | POA: Diagnosis not present

## 2023-10-05 DIAGNOSIS — Z7982 Long term (current) use of aspirin: Secondary | ICD-10-CM | POA: Insufficient documentation

## 2023-10-05 DIAGNOSIS — R498 Other voice and resonance disorders: Secondary | ICD-10-CM | POA: Diagnosis not present

## 2023-10-05 DIAGNOSIS — R0789 Other chest pain: Secondary | ICD-10-CM | POA: Diagnosis not present

## 2023-10-05 LAB — CBC WITH DIFFERENTIAL/PLATELET
Abs Immature Granulocytes: 0.05 10*3/uL (ref 0.00–0.07)
Basophils Absolute: 0 10*3/uL (ref 0.0–0.1)
Basophils Relative: 0 %
Eosinophils Absolute: 0.3 10*3/uL (ref 0.0–0.5)
Eosinophils Relative: 2 %
HCT: 36.2 % — ABNORMAL LOW (ref 39.0–52.0)
Hemoglobin: 12.4 g/dL — ABNORMAL LOW (ref 13.0–17.0)
Immature Granulocytes: 0 %
Lymphocytes Relative: 9 %
Lymphs Abs: 1.2 10*3/uL (ref 0.7–4.0)
MCH: 31.8 pg (ref 26.0–34.0)
MCHC: 34.3 g/dL (ref 30.0–36.0)
MCV: 92.8 fL (ref 80.0–100.0)
Monocytes Absolute: 1.1 10*3/uL — ABNORMAL HIGH (ref 0.1–1.0)
Monocytes Relative: 8 %
Neutro Abs: 10.4 10*3/uL — ABNORMAL HIGH (ref 1.7–7.7)
Neutrophils Relative %: 81 %
Platelets: 186 10*3/uL (ref 150–400)
RBC: 3.9 MIL/uL — ABNORMAL LOW (ref 4.22–5.81)
RDW: 12.8 % (ref 11.5–15.5)
WBC: 13 10*3/uL — ABNORMAL HIGH (ref 4.0–10.5)
nRBC: 0 % (ref 0.0–0.2)

## 2023-10-05 LAB — BASIC METABOLIC PANEL
Anion gap: 8 (ref 5–15)
BUN: 23 mg/dL (ref 8–23)
CO2: 24 mmol/L (ref 22–32)
Calcium: 9.5 mg/dL (ref 8.9–10.3)
Chloride: 103 mmol/L (ref 98–111)
Creatinine, Ser: 0.86 mg/dL (ref 0.61–1.24)
GFR, Estimated: >60 mL/min (ref >60)
Glucose, Bld: 132 mg/dL — ABNORMAL HIGH (ref 70–99)
Potassium: 4 mmol/L (ref 3.5–5.1)
Sodium: 135 mmol/L (ref 135–145)

## 2023-10-05 LAB — CBG MONITORING, ED: Glucose-Capillary: 125 mg/dL — ABNORMAL HIGH (ref 70–99)

## 2023-10-05 NOTE — ED Triage Notes (Signed)
 Pt in from [facility] indep living via [place] after witnessed fall and seizure-like activity. Per [facility], they were called out for lift assist after fall. They arrived and reportedly witnessed 2 seizures. Pt states he was on the way to the bathroom, slipped on the floor and hit his head vs the floor, opening L periorbital sutures placed after a recent fall 1 week ago. Reports LOC and denies thinners, no hx of seizures. States low back pain and R shoulder pain, a&ox4, hx Parkinsons. C-collar present on ED arrival

## 2023-10-05 NOTE — ED Provider Notes (Signed)
 [facility]PARTMENT AT [facility] Provider Note   CSN: [id number] Arrival date & time: 10/05/23  0359     History  Chief Complaint  Patient presents with   Fall   Head Injury   Seizures   Level 5 caveat due to altered mental status Seth Carter is a 83 y.o. male.  The history is provided by the patient.  Patient with history of Parkinson disease, cognitive decline, orthostatic hypotension presents for fall It is reported patient was walking the bathroom slipped and fell and hit his head.  EMS was called for lift assist, they report he had brief seizure-like activity He is now awake and alert.  He is not on anticoagulation.  Patient with recent fall and already had an injury to his left eye    Home Medications Prior to Admission medications   Medication Sig Start Date End Date Taking? Authorizing Provider  aspirin EC 81 MG tablet Take 81 mg by mouth at bedtime.    [provider]  carbidopa-levodopa (SINEMET IR) 25-100 MG tablet Take 2 tablets by mouth 3 (three) times daily.    [provider]  cetirizine (ZYRTEC) 10 MG tablet Take 10 mg by mouth daily.    [provider]  Cyanocobalamin (VITAMIN B-12 PO) Take 1 tablet by mouth daily with lunch.    [provider]  dorzolamide-timolol (COSOPT) 22.3-6.8 MG/ML ophthalmic solution Place 1 drop into both eyes 2 (two) times daily.    [provider]  fludrocortisone (FLORINEF) 0.1 MG tablet Take 1 tablet (0.1 mg total) by mouth every other day. 01/01/23   [name], MD  latanoprost (XALATAN) 0.005 % ophthalmic solution Place 1 drop into both eyes at bedtime.    [provider]  MAGNESIUM OXIDE PO Take 1 tablet by mouth at bedtime.    [provider]  Multiple Vitamin (MULTIVITAMIN WITH MINERALS) TABS tablet Take 1 tablet by mouth daily.    [provider]  Multiple Vitamins-Minerals (PRESERVISION/LUTEIN) CAPS Take 1 capsule by mouth  daily with lunch.    [provider]  rosuvastatin (CRESTOR) 10 MG tablet Take 10 mg by mouth at bedtime.    [provider]      Allergies    Cialis [tadalafil], Pneumovax [pneumococcal polysaccharide vaccine], Zocor [simvastatin], Ace inhibitors, and Contrast media [iodinated contrast media]    Review of Systems   Review of Systems  Unable to perform ROS: Mental status change    Physical Exam Updated Vital Signs BP (!) 150/68   Pulse (!) 58   Temp (!) 97.5 F (36.4 C) (Oral)   Resp 15   Wt 79.4 kg   SpO2 100%   BMI 24.41 kg/m  Physical Exam CONSTITUTIONAL: Elderly, no acute distress HEAD: left Periorbital hematoma noted EYES: EOMI/PERRL, no hyphema NECK: Cervical collar in place SPINE/BACK:entire spine nontender No bruising/crepitance/stepoffs noted to spine CV: S1/S2 noted, no murmurs/rubs/gallops noted LUNGS: Lungs are clear to auscultation bilaterally, no apparent distress ABDOMEN: soft, nontender NEURO: Pt is awake/alert, moves all extremitiesx4.  No facial droop.  Patient appears confused, no focal weakness Tremor noted EXTREMITIES: pulses normal/equal, full ROM Pelvis stable All other extremities/joints palpated/ranged and nontender Bandages noted both arms from previous skin tears SKIN: warm, color normal   ED Results / Procedures / Treatments   Labs (all labs ordered are listed, but only abnormal results are displayed) Labs Reviewed  CBC WITH DIFFERENTIAL/PLATELET - Abnormal; Notable for the following components:  Result Value   WBC 13.0 (*)    RBC 3.90 (*)    Hemoglobin 12.4 (*)    HCT 36.2 (*)    Neutro Abs 10.4 (*)    Monocytes Absolute 1.1 (*)    All other components within normal limits  BASIC METABOLIC PANEL - Abnormal; Notable for the following components:   Glucose, Bld 132 (*)    All other components within normal limits  CBG MONITORING, ED - Abnormal; Notable for the following components:   Glucose-Capillary 125 (*)     All other components within normal limits    EKG EKG Interpretation Date/Time:  Friday October 05 2023 04:12:06 EDT Ventricular Rate:  56 PR Interval:  188 QRS Duration:  97 QT Interval:  460 QTC Calculation: 444 R Axis:   78  Text Interpretation: Sinus rhythm Borderline repolarization abnormality Confirmed by Zadie Rhine ([id number]) on 10/05/2023 4:24:45 AM  Radiology DG Chest Port 1 View Result Date: 10/05/2023 CLINICAL DATA:  Seizure and fall. EXAM: PORTABLE CHEST 1 VIEW COMPARISON:  None Available. FINDINGS: Normal heart size and mediastinal contours. No acute infiltrate or edema. No effusion or pneumothorax. No acute osseous findings. Sclerotic density over the anterior right fourth rib is unchanged since at least 2023 IMPRESSION: No active disease. Electronically Signed   By: Tiburcio Pea M.D.   On: 10/05/2023 06:16   CT HEAD WO CONTRAST Result Date: 10/05/2023 CLINICAL DATA:  Fall with head injury.  Seizure. EXAM: CT HEAD WITHOUT CONTRAST CT MAXILLOFACIAL WITHOUT CONTRAST CT CERVICAL SPINE WITHOUT CONTRAST TECHNIQUE: Multidetector CT imaging of the head, cervical spine, and maxillofacial structures were performed using the standard protocol without intravenous contrast. Multiplanar CT image reconstructions of the cervical spine and maxillofacial structures were also generated. RADIATION DOSE REDUCTION: This exam was performed according to the departmental dose-optimization program which includes automated exposure control, adjustment of the mA and/or kV according to patient size and/or use of iterative reconstruction technique. COMPARISON:  09/29/2023 FINDINGS: CT HEAD FINDINGS Brain: No evidence of acute infarction, hemorrhage, hydrocephalus, extra-axial collection or mass lesion/mass effect. Vascular: No hyperdense vessel or unexpected calcification. Skull: Negative for fracture CT MAXILLOFACIAL FINDINGS Osseous: No fracture or mandibular dislocation. Orbits: No evidence of  postseptal injury Sinuses: Negative for hemosinus Soft tissues: Soft tissue swelling lateral to the left orbit. The degree appears similar to prior. CT CERVICAL SPINE FINDINGS Alignment: No traumatic malalignment Skull base and vertebrae: No acute fracture. Chronic T2 superior endplate fracture. Soft tissues and spinal canal: No prevertebral fluid or swelling. No visible canal hematoma. Disc levels: Advanced and generalized degenerative spurring with C3-4 ankylosis. Upper chest: No evidence of injury IMPRESSION: 1. No evidence of acute intracranial or cervical spine injury. 2. Soft tissue swelling lateral to the left orbit, similar to recent comparison. No facial fracture. Electronically Signed   By: Tiburcio Pea M.D.   On: 10/05/2023 05:32   CT MAXILLOFACIAL WO CONTRAST Result Date: 10/05/2023 CLINICAL DATA:  Fall with head injury.  Seizure. EXAM: CT HEAD WITHOUT CONTRAST CT MAXILLOFACIAL WITHOUT CONTRAST CT CERVICAL SPINE WITHOUT CONTRAST TECHNIQUE: Multidetector CT imaging of the head, cervical spine, and maxillofacial structures were performed using the standard protocol without intravenous contrast. Multiplanar CT image reconstructions of the cervical spine and maxillofacial structures were also generated. RADIATION DOSE REDUCTION: This exam was performed according to the departmental dose-optimization program which includes automated exposure control, adjustment of the mA and/or kV according to patient size and/or use of iterative reconstruction technique. COMPARISON:  09/29/2023 FINDINGS: CT HEAD FINDINGS  Brain: No evidence of acute infarction, hemorrhage, hydrocephalus, extra-axial collection or mass lesion/mass effect. Vascular: No hyperdense vessel or unexpected calcification. Skull: Negative for fracture CT MAXILLOFACIAL FINDINGS Osseous: No fracture or mandibular dislocation. Orbits: No evidence of postseptal injury Sinuses: Negative for hemosinus Soft tissues: Soft tissue swelling lateral to the  left orbit. The degree appears similar to prior. CT CERVICAL SPINE FINDINGS Alignment: No traumatic malalignment Skull base and vertebrae: No acute fracture. Chronic T2 superior endplate fracture. Soft tissues and spinal canal: No prevertebral fluid or swelling. No visible canal hematoma. Disc levels: Advanced and generalized degenerative spurring with C3-4 ankylosis. Upper chest: No evidence of injury IMPRESSION: 1. No evidence of acute intracranial or cervical spine injury. 2. Soft tissue swelling lateral to the left orbit, similar to recent comparison. No facial fracture. Electronically Signed   By: Tiburcio Pea M.D.   On: 10/05/2023 05:32   CT CERVICAL SPINE WO CONTRAST Result Date: 10/05/2023 CLINICAL DATA:  Fall with head injury.  Seizure. EXAM: CT HEAD WITHOUT CONTRAST CT MAXILLOFACIAL WITHOUT CONTRAST CT CERVICAL SPINE WITHOUT CONTRAST TECHNIQUE: Multidetector CT imaging of the head, cervical spine, and maxillofacial structures were performed using the standard protocol without intravenous contrast. Multiplanar CT image reconstructions of the cervical spine and maxillofacial structures were also generated. RADIATION DOSE REDUCTION: This exam was performed according to the departmental dose-optimization program which includes automated exposure control, adjustment of the mA and/or kV according to patient size and/or use of iterative reconstruction technique. COMPARISON:  09/29/2023 FINDINGS: CT HEAD FINDINGS Brain: No evidence of acute infarction, hemorrhage, hydrocephalus, extra-axial collection or mass lesion/mass effect. Vascular: No hyperdense vessel or unexpected calcification. Skull: Negative for fracture CT MAXILLOFACIAL FINDINGS Osseous: No fracture or mandibular dislocation. Orbits: No evidence of postseptal injury Sinuses: Negative for hemosinus Soft tissues: Soft tissue swelling lateral to the left orbit. The degree appears similar to prior. CT CERVICAL SPINE FINDINGS Alignment: No traumatic  malalignment Skull base and vertebrae: No acute fracture. Chronic T2 superior endplate fracture. Soft tissues and spinal canal: No prevertebral fluid or swelling. No visible canal hematoma. Disc levels: Advanced and generalized degenerative spurring with C3-4 ankylosis. Upper chest: No evidence of injury IMPRESSION: 1. No evidence of acute intracranial or cervical spine injury. 2. Soft tissue swelling lateral to the left orbit, similar to recent comparison. No facial fracture. Electronically Signed   By: Tiburcio Pea M.D.   On: 10/05/2023 05:32    Procedures Procedures    Medications Ordered in ED Medications - No data to display  ED Course/ Medical Decision Making/ A&P Clinical Course as of 10/05/23 0712  Fri Oct 05, 2023  0449 Prehospital EKG reviewed, no acute changes [DW]  0503 WBC(!): 13.0 Leukocytosis [DW]  0504 Patient presents for his second fall in the past week.  He already had a laceration to his left eyebrow, but still appears well-approximated and does have bruising around it.  He will need CT head C-spine maxillofacial.  Have also ordered a chest x-ray Labs are pending.  I attempted to call wife and children, but no one is answered after multiple calls Per chart, patient is a full code [DW]  0710 All imaging is negative.  Patient back to baseline, no changes in mental status.  I suspect his episode at the facility was likely due to low blood pressure which he has frequently with change in position [DW]  0710 Discussed the case with his daughter Olegario Messier.  She reports they are working on getting him and his wife upgraded  to assisted living as he has had increasing falls.  He has had a 24-hour caregiver but still continues to fall likely due to drops in his blood pressure.  Lucila Maine is at bedside who will be able to take him back to facility while they work on a placement  On recheck, patient is in no acute distress, no signs of any extremity trauma.  No chest or abdominal  tenderness.  The wound on his face is without any further bleeding.  Patient is back to his baseline [DW]    Clinical Course User Index [DW] Zadie Rhine, MD                                 Medical Decision Making Amount and/or Complexity of Data Reviewed Labs: ordered. Decision-making details documented in ED Course. Radiology: ordered.   This patient presents to the ED for concern of fall with head trauma, this involves an extensive number of treatment options, and is a complaint that carries with it a high risk of complications and morbidity.  The differential diagnosis includes but is not limited to subdural hematoma, subarachnoid hemorrhage, skull fracture, concussion   Comorbidities that complicate the patient evaluation: Patient's presentation is complicated by their history of Parkinson's  Social Determinants of Health: Patient's  poor mobility   increases the complexity of managing their presentation  Additional history obtained: Records reviewed Care Everywhere/External Records  Lab Tests: I Ordered, and personally interpreted labs.  The pertinent results include: Mild leukocytosis  Imaging Studies ordered: I ordered imaging studies including CT scan head/maxillofacial, C-spine   I independently visualized and interpreted imaging which showed no acute traumatic injuries I agree with the radiologist interpretation   Reevaluation: After the interventions noted above, I reevaluated the patient and found that they have :improved  Complexity of problems addressed: Patient's presentation is most consistent with  acute presentation with potential threat to life or bodily function  Disposition: After consideration of the diagnostic results and the patient's response to treatment,  I feel that the patent would benefit from discharge   .           Final Clinical Impression(s) / ED Diagnoses Final diagnoses:  Fall, initial encounter  Concussion with unknown  loss of consciousness status, initial encounter  Strain of neck muscle, initial encounter  Blunt trauma to chest, initial encounter    Rx / DC Orders ED Discharge Orders     None         Zadie Rhine, MD 10/05/23 [phone]

## 2023-10-05 NOTE — ED Notes (Signed)
 Grandson present in room. Daughter, Olegario Messier updated on phone by Dr. Bebe Shaggy

## 2023-10-05 NOTE — ED Notes (Signed)
 Please call Olegario Messier (daughter), not Dewayne Hatch (spouse has dementia), number under emergency contacts.

## 2023-10-05 NOTE — ED Notes (Signed)
 Attempted to call wife for an update

## 2023-10-05 NOTE — Discharge Instructions (Signed)
 It is highly recommended that you upgrade your living situation to assisted living as we discussed

## 2023-10-05 NOTE — ED Notes (Signed)
 PLEASE CALL ANN (SPOUSE) FOR UPDATE, NUMBER UNDER EMERGENCY CONTACTS

## 2023-10-08 ENCOUNTER — Emergency Department (HOSPITAL_COMMUNITY)
Admission: EM | Admit: 2023-10-08 | Discharge: 2023-10-10 | Disposition: A | Discharge status: Home / Self Care | Attending: Emergency Medicine | Admitting: Emergency Medicine

## 2023-10-08 ENCOUNTER — Emergency Department (HOSPITAL_COMMUNITY)

## 2023-10-08 ENCOUNTER — Other Ambulatory Visit: Payer: Self-pay

## 2023-10-08 ENCOUNTER — Encounter (INDEPENDENT_AMBULATORY_CARE_PROVIDER_SITE_OTHER): Payer: Medicare Other | Admitting: Ophthalmology

## 2023-10-08 ENCOUNTER — Encounter (HOSPITAL_COMMUNITY): Payer: Self-pay | Admitting: Emergency Medicine

## 2023-10-08 DIAGNOSIS — S51011A Laceration without foreign body of right elbow, initial encounter: Secondary | ICD-10-CM | POA: Diagnosis not present

## 2023-10-08 DIAGNOSIS — R0902 Hypoxemia: Secondary | ICD-10-CM | POA: Diagnosis not present

## 2023-10-08 DIAGNOSIS — G20A1 Parkinson's disease without dyskinesia, without mention of fluctuations: Secondary | ICD-10-CM | POA: Insufficient documentation

## 2023-10-08 DIAGNOSIS — M79671 Pain in right foot: Secondary | ICD-10-CM | POA: Diagnosis not present

## 2023-10-08 DIAGNOSIS — S51812D Laceration without foreign body of left forearm, subsequent encounter: Secondary | ICD-10-CM | POA: Insufficient documentation

## 2023-10-08 DIAGNOSIS — I700 Atherosclerosis of aorta: Secondary | ICD-10-CM | POA: Diagnosis not present

## 2023-10-08 DIAGNOSIS — R55 Syncope and collapse: Secondary | ICD-10-CM | POA: Insufficient documentation

## 2023-10-08 DIAGNOSIS — S51811D Laceration without foreign body of right forearm, subsequent encounter: Secondary | ICD-10-CM | POA: Insufficient documentation

## 2023-10-08 DIAGNOSIS — Z4801 Encounter for change or removal of surgical wound dressing: Secondary | ICD-10-CM | POA: Diagnosis not present

## 2023-10-08 DIAGNOSIS — M2011 Hallux valgus (acquired), right foot: Secondary | ICD-10-CM | POA: Diagnosis not present

## 2023-10-08 DIAGNOSIS — Z043 Encounter for examination and observation following other accident: Secondary | ICD-10-CM | POA: Diagnosis not present

## 2023-10-08 DIAGNOSIS — S299XXA Unspecified injury of thorax, initial encounter: Secondary | ICD-10-CM | POA: Insufficient documentation

## 2023-10-08 DIAGNOSIS — W19XXXA Unspecified fall, initial encounter: Secondary | ICD-10-CM | POA: Insufficient documentation

## 2023-10-08 DIAGNOSIS — M7661 Achilles tendinitis, right leg: Secondary | ICD-10-CM | POA: Diagnosis not present

## 2023-10-08 DIAGNOSIS — R1310 Dysphagia, unspecified: Secondary | ICD-10-CM | POA: Diagnosis not present

## 2023-10-08 DIAGNOSIS — S3991XA Unspecified injury of abdomen, initial encounter: Secondary | ICD-10-CM | POA: Diagnosis not present

## 2023-10-08 DIAGNOSIS — Y92002 Bathroom of unspecified non-institutional (private) residence single-family (private) house as the place of occurrence of the external cause: Secondary | ICD-10-CM | POA: Insufficient documentation

## 2023-10-08 DIAGNOSIS — S59901A Unspecified injury of right elbow, initial encounter: Secondary | ICD-10-CM | POA: Diagnosis present

## 2023-10-08 DIAGNOSIS — M19071 Primary osteoarthritis, right ankle and foot: Secondary | ICD-10-CM | POA: Diagnosis not present

## 2023-10-08 DIAGNOSIS — I10 Essential (primary) hypertension: Secondary | ICD-10-CM | POA: Insufficient documentation

## 2023-10-08 DIAGNOSIS — J9811 Atelectasis: Secondary | ICD-10-CM | POA: Diagnosis not present

## 2023-10-08 DIAGNOSIS — Z7982 Long term (current) use of aspirin: Secondary | ICD-10-CM | POA: Insufficient documentation

## 2023-10-08 DIAGNOSIS — R410 Disorientation, unspecified: Secondary | ICD-10-CM | POA: Diagnosis not present

## 2023-10-08 DIAGNOSIS — S01112D Laceration without foreign body of left eyelid and periocular area, subsequent encounter: Secondary | ICD-10-CM | POA: Diagnosis not present

## 2023-10-08 DIAGNOSIS — I6523 Occlusion and stenosis of bilateral carotid arteries: Secondary | ICD-10-CM | POA: Diagnosis not present

## 2023-10-08 LAB — CBC WITH DIFFERENTIAL/PLATELET
Abs Immature Granulocytes: 0.06 10*3/uL (ref 0.00–0.07)
Basophils Absolute: 0.1 10*3/uL (ref 0.0–0.1)
Basophils Relative: 1 %
Eosinophils Absolute: 0.2 10*3/uL (ref 0.0–0.5)
Eosinophils Relative: 2 %
HCT: 35.3 % — ABNORMAL LOW (ref 39.0–52.0)
Hemoglobin: 12.2 g/dL — ABNORMAL LOW (ref 13.0–17.0)
Immature Granulocytes: 1 %
Lymphocytes Relative: 11 %
Lymphs Abs: 1.4 10*3/uL (ref 0.7–4.0)
MCH: 31.9 pg (ref 26.0–34.0)
MCHC: 34.6 g/dL (ref 30.0–36.0)
MCV: 92.4 fL (ref 80.0–100.0)
Monocytes Absolute: 1.1 10*3/uL — ABNORMAL HIGH (ref 0.1–1.0)
Monocytes Relative: 8 %
Neutro Abs: 9.8 10*3/uL — ABNORMAL HIGH (ref 1.7–7.7)
Neutrophils Relative %: 77 %
Platelets: 201 10*3/uL (ref 150–400)
RBC: 3.82 MIL/uL — ABNORMAL LOW (ref 4.22–5.81)
RDW: 12.8 % (ref 11.5–15.5)
WBC: 12.6 10*3/uL — ABNORMAL HIGH (ref 4.0–10.5)
nRBC: 0 % (ref 0.0–0.2)

## 2023-10-08 LAB — COMPREHENSIVE METABOLIC PANEL
ALT: 8 U/L (ref 0–44)
AST: 20 U/L (ref 15–41)
Albumin: 3.5 g/dL (ref 3.5–5.0)
Alkaline Phosphatase: 76 U/L (ref 38–126)
Anion gap: 9 (ref 5–15)
BUN: 24 mg/dL — ABNORMAL HIGH (ref 8–23)
CO2: 25 mmol/L (ref 22–32)
Calcium: 9.3 mg/dL (ref 8.9–10.3)
Chloride: 104 mmol/L (ref 98–111)
Creatinine, Ser: 1.02 mg/dL (ref 0.61–1.24)
GFR, Estimated: >60 mL/min (ref >60)
Glucose, Bld: 126 mg/dL — ABNORMAL HIGH (ref 70–99)
Potassium: 4 mmol/L (ref 3.5–5.1)
Sodium: 138 mmol/L (ref 135–145)
Total Bilirubin: 1.1 mg/dL (ref 0.0–1.2)
Total Protein: 6.5 g/dL (ref 6.5–8.1)

## 2023-10-08 LAB — TROPONIN I (HIGH SENSITIVITY): Troponin I (High Sensitivity): 7 ng/L (ref <18)

## 2023-10-08 MED ORDER — DROXIDOPA 300 MG PO CAPS
500.0000 mg | ORAL_CAPSULE | Freq: Three times a day (TID) | ORAL | Status: DC
  Administered 2023-10-08 – 2023-10-10 (×6): 500 mg via ORAL
  Filled 2023-10-08 (×10): qty 2

## 2023-10-08 MED ORDER — CARBIDOPA-LEVODOPA 25-100 MG PO TABS
2.0000 | ORAL_TABLET | Freq: Three times a day (TID) | ORAL | Status: DC
  Administered 2023-10-08 – 2023-10-10 (×7): 2 via ORAL
  Filled 2023-10-08 (×7): qty 2

## 2023-10-08 NOTE — ED Provider Notes (Addendum)
 [place] [facility] AT [facility] Provider Note   CSN: [id number] Arrival date & time: 10/08/23  0156     History  Chief Complaint  Patient presents with   Seth Carter is a 83 y.o. male.  HPI Patient presents for fall.  Medical history includes HTN, HLD, GERD, arthritis, BPH, Parkinson's disease.  He was seen in the ED twice in the last 10 days for other falls, including 3 days ago.  Tonight, he had a fall while in the bathroom.  His wife heard him fall to the ground.  She states that he did have a brief LOC but woke up in his normal mentation.  No seizure activity was witnessed.  EMS was able to help him to a standing position.  Patient endorses some burning sensation to his right eye but denies any other new areas of discomfort.    Home Medications Prior to Admission medications   Medication Sig Start Date End Date Taking? Authorizing Provider  aspirin EC 81 MG tablet Take 81 mg by mouth at bedtime.    [provider]  carbidopa-levodopa (SINEMET IR) 25-100 MG tablet Take 2 tablets by mouth 3 (three) times daily.    [provider]  cetirizine (ZYRTEC) 10 MG tablet Take 10 mg by mouth daily.    [provider]  Cyanocobalamin (VITAMIN B-12 PO) Take 1 tablet by mouth daily with lunch.    [provider]  dorzolamide-timolol (COSOPT) 22.3-6.8 MG/ML ophthalmic solution Place 1 drop into both eyes 2 (two) times daily.    [provider]  fludrocortisone (FLORINEF) 0.1 MG tablet Take 1 tablet (0.1 mg total) by mouth every other day. 01/01/23   [name], MD  latanoprost (XALATAN) 0.005 % ophthalmic solution Place 1 drop into both eyes at bedtime.    [provider]  MAGNESIUM OXIDE PO Take 1 tablet by mouth at bedtime.    [provider]  Multiple Vitamin (MULTIVITAMIN WITH MINERALS) TABS tablet Take 1 tablet by mouth daily.    [provider]  Multiple Vitamins-Minerals  (PRESERVISION/LUTEIN) CAPS Take 1 capsule by mouth daily with lunch.    [provider]  rosuvastatin (CRESTOR) 10 MG tablet Take 10 mg by mouth at bedtime.    [provider]      Allergies    Cialis [tadalafil], Pneumovax [pneumococcal polysaccharide vaccine], Zocor [simvastatin], Ace inhibitors, and Contrast media [iodinated contrast media]    Review of Systems   Review of Systems  Neurological:  Positive for syncope.  All other systems reviewed and are negative.   Physical Exam Updated Vital Signs BP (!) 193/79 (BP Location: Right Arm)   Pulse (!) 56   Temp 97.8 F (36.6 C) (Oral)   Resp 17   Ht 5\' 11"  (1.803 m)   Wt 79.4 kg   SpO2 100%   BMI 24.41 kg/m  Physical Exam Vitals and nursing note reviewed.  Constitutional:      General: He is not in acute distress.    Appearance: Normal appearance. He is well-developed. He is not ill-appearing, toxic-appearing or diaphoretic.  HENT:     Head: Normocephalic.     Comments: Old bruising and stitches to left eyebrow from prior falls.    Right Ear: External ear normal.     Left Ear: External ear normal.     Nose: Nose normal.     Mouth/Throat:     Mouth: Mucous membranes are moist.  Eyes:  Extraocular Movements: Extraocular movements intact.     Conjunctiva/sclera: Conjunctivae normal.  Cardiovascular:     Rate and Rhythm: Normal rate and regular rhythm.     Heart sounds: No murmur heard. Pulmonary:     Effort: Pulmonary effort is normal. No respiratory distress.     Breath sounds: Normal breath sounds. No wheezing or rales.  Chest:     Chest wall: No tenderness.  Abdominal:     General: There is no distension.     Palpations: Abdomen is soft.     Tenderness: There is no abdominal tenderness.  Musculoskeletal:        General: No swelling. Normal range of motion.     Cervical back: Normal range of motion and neck supple.     Right lower leg: No edema.     Left lower leg: No edema.  Skin:     General: Skin is warm and dry.     Coloration: Skin is not jaundiced or pale.     Comments: Old skin tears to bilateral forearms, new skin tear to right elbow.  Neurological:     General: No focal deficit present.     Mental Status: He is alert.  Psychiatric:        Mood and Affect: Mood normal.        Behavior: Behavior normal.     ED Results / Procedures / Treatments   Labs (all labs ordered are listed, but only abnormal results are displayed) Labs Reviewed  CBC WITH DIFFERENTIAL/PLATELET - Abnormal; Notable for the following components:      Result Value   WBC 12.6 (*)    RBC 3.82 (*)    Hemoglobin 12.2 (*)    HCT 35.3 (*)    Neutro Abs 9.8 (*)    Monocytes Absolute 1.1 (*)    All other components within normal limits  COMPREHENSIVE METABOLIC PANEL - Abnormal; Notable for the following components:   Glucose, Bld 126 (*)    BUN 24 (*)    All other components within normal limits  URINALYSIS, ROUTINE W REFLEX MICROSCOPIC  CBG MONITORING, ED  TROPONIN I (HIGH SENSITIVITY)    EKG EKG Interpretation Date/Time:  Monday October 08 2023 02:01:47 EDT Ventricular Rate:  59 PR Interval:  195 QRS Duration:  98 QT Interval:  442 QTC Calculation: 438 R Axis:   55  Text Interpretation: Sinus rhythm Borderline repolarization abnormality Confirmed by Gloris Manchester ([id number]) on 10/08/2023 3:11:25 AM  Radiology CT CHEST ABDOMEN PELVIS WO CONTRAST Result Date: 10/08/2023 CLINICAL DATA:  Fall EXAM: CT CHEST, ABDOMEN AND PELVIS WITHOUT CONTRAST TECHNIQUE: Multidetector CT imaging of the chest, abdomen and pelvis was performed following the standard protocol without IV contrast. RADIATION DOSE REDUCTION: This exam was performed according to the departmental dose-optimization program which includes automated exposure control, adjustment of the mA and/or kV according to patient size and/or use of iterative reconstruction technique. COMPARISON:  None Available. FINDINGS: CT CHEST FINDINGS  Cardiovascular: No significant vascular findings. Normal heart size. No pericardial effusion. Calcific aortic atherosclerosis. Coronary artery calcifications. Mediastinum/Nodes: No enlarged mediastinal, hilar, or axillary lymph nodes. Thyroid gland, trachea, and esophagus demonstrate no significant findings. Lungs/Pleura: Lungs are clear. No pleural effusion or pneumothorax. Right basilar atelectasis. Musculoskeletal: No chest wall mass or suspicious bone lesions identified. CT ABDOMEN PELVIS FINDINGS Hepatobiliary: No focal liver abnormality is seen. No gallstones, gallbladder wall thickening, or biliary dilatation. Pancreas: Unremarkable. No pancreatic ductal dilatation or surrounding inflammatory changes. Spleen: No splenic injury or perisplenic  hematoma. Adrenals/Urinary Tract: Adrenal glands are unremarkable. Kidneys are normal, without renal calculi, focal lesion, or hydronephrosis. Bladder is unremarkable. Stomach/Bowel: Stomach is within normal limits. Appendix appears normal. No evidence of bowel wall thickening, distention, or inflammatory changes. Vascular/Lymphatic: Calcific aortic atherosclerosis. Reproductive: Prostate is unremarkable. Other: No abdominal wall hernia or abnormality. No abdominopelvic ascites. Musculoskeletal: No fracture is seen. IMPRESSION: 1. No acute abnormality of the chest, abdomen or pelvis. 2. Coronary artery calcifications and Aortic atherosclerosis (ICD10-I70.0). Electronically Signed   By: Deatra Robinson M.D.   On: 10/08/2023 03:40   CT T-SPINE NO CHARGE Result Date: 10/08/2023 CLINICAL DATA:  Fall EXAM: CT Thoracic and Lumbar spine  Contrast TECHNIQUE: Multiplanar CT images of the thoracic and lumbar spine were reconstructed from contemporary CT of the Chest, Abdomen, and Pelvis. RADIATION DOSE REDUCTION: This exam was performed according to the departmental dose-optimization program which includes automated exposure control, adjustment of the mA and/or kV according to  patient size and/or use of iterative reconstruction technique. CONTRAST:  None or No additional COMPARISON:  None Available. FINDINGS: CT THORACIC SPINE FINDINGS Alignment: Normal. Vertebrae: No acute fracture or focal pathologic process. Disc levels: No spinal canal stenosis CT LUMBAR SPINE FINDINGS Segmentation: 5 lumbar type vertebrae. Alignment: Normal. Vertebrae: No acute fracture or focal pathologic process. Disc levels: No spinal canal stenosis. IMPRESSION: No acute fracture or static subluxation of the thoracic or lumbar spine. Electronically Signed   By: Deatra Robinson M.D.   On: 10/08/2023 03:17   CT L-SPINE NO CHARGE Result Date: 10/08/2023 CLINICAL DATA:  Fall EXAM: CT Thoracic and Lumbar spine  Contrast TECHNIQUE: Multiplanar CT images of the thoracic and lumbar spine were reconstructed from contemporary CT of the Chest, Abdomen, and Pelvis. RADIATION DOSE REDUCTION: This exam was performed according to the departmental dose-optimization program which includes automated exposure control, adjustment of the mA and/or kV according to patient size and/or use of iterative reconstruction technique. CONTRAST:  None or No additional COMPARISON:  None Available. FINDINGS: CT THORACIC SPINE FINDINGS Alignment: Normal. Vertebrae: No acute fracture or focal pathologic process. Disc levels: No spinal canal stenosis CT LUMBAR SPINE FINDINGS Segmentation: 5 lumbar type vertebrae. Alignment: Normal. Vertebrae: No acute fracture or focal pathologic process. Disc levels: No spinal canal stenosis. IMPRESSION: No acute fracture or static subluxation of the thoracic or lumbar spine. Electronically Signed   By: Deatra Robinson M.D.   On: 10/08/2023 03:17   CT HEAD WO CONTRAST Result Date: 10/08/2023 CLINICAL DATA:  Fall EXAM: CT HEAD WITHOUT CONTRAST CT MAXILLOFACIAL WITHOUT CONTRAST CT CERVICAL SPINE WITHOUT CONTRAST TECHNIQUE: Multidetector CT imaging of the head, cervical spine, and maxillofacial structures were  performed using the standard protocol without intravenous contrast. Multiplanar CT image reconstructions of the cervical spine and maxillofacial structures were also generated. RADIATION DOSE REDUCTION: This exam was performed according to the departmental dose-optimization program which includes automated exposure control, adjustment of the mA and/or kV according to patient size and/or use of iterative reconstruction technique. COMPARISON:  None Available. FINDINGS: CT HEAD FINDINGS Brain: There is no mass, hemorrhage or extra-axial collection. The size and configuration of the ventricles and extra-axial CSF spaces are normal. There is hypoattenuation of the white matter, most commonly indicating chronic small vessel disease. Vascular: Atherosclerotic calcification of the internal carotid arteries at the skull base. No abnormal hyperdensity of the major intracranial arteries or dural venous sinuses. Skull: The visualized skull base, calvarium and extracranial soft tissues are normal. Sinuses/Orbits: No fluid levels or advanced mucosal thickening of  the visualized paranasal sinuses. No mastoid or middle ear effusion. Normal orbits. Other: None. CT MAXILLOFACIAL FINDINGS Osseous: No facial fracture or mandibular dislocation. Orbits: The globes are intact. Normal appearance of the intra- and extraconal fat. Symmetric extraocular muscles and optic nerves. Sinuses: No fluid levels or advanced mucosal thickening. Soft tissues: Normal visualized extracranial soft tissues. CT CERVICAL SPINE FINDINGS Alignment: No static subluxation. Facets are aligned. Occipital condyles and the lateral masses of C1-C2 are aligned. Skull base and vertebrae: No acute fracture. Soft tissues and spinal canal: No prevertebral fluid or swelling. No visible canal hematoma. Disc levels: No advanced spinal canal or neural foraminal stenosis. Upper chest: No pneumothorax, pulmonary nodule or pleural effusion. Other: Normal visualized paraspinal  cervical soft tissues. IMPRESSION: 1. No acute intracranial abnormality. 2. Chronic small vessel disease. 3. No facial fracture. 4. No acute fracture or static subluxation of the cervical spine. Electronically Signed   By: Deatra Robinson M.D.   On: 10/08/2023 03:14   CT CERVICAL SPINE WO CONTRAST Result Date: 10/08/2023 CLINICAL DATA:  Fall EXAM: CT HEAD WITHOUT CONTRAST CT MAXILLOFACIAL WITHOUT CONTRAST CT CERVICAL SPINE WITHOUT CONTRAST TECHNIQUE: Multidetector CT imaging of the head, cervical spine, and maxillofacial structures were performed using the standard protocol without intravenous contrast. Multiplanar CT image reconstructions of the cervical spine and maxillofacial structures were also generated. RADIATION DOSE REDUCTION: This exam was performed according to the departmental dose-optimization program which includes automated exposure control, adjustment of the mA and/or kV according to patient size and/or use of iterative reconstruction technique. COMPARISON:  None Available. FINDINGS: CT HEAD FINDINGS Brain: There is no mass, hemorrhage or extra-axial collection. The size and configuration of the ventricles and extra-axial CSF spaces are normal. There is hypoattenuation of the white matter, most commonly indicating chronic small vessel disease. Vascular: Atherosclerotic calcification of the internal carotid arteries at the skull base. No abnormal hyperdensity of the major intracranial arteries or dural venous sinuses. Skull: The visualized skull base, calvarium and extracranial soft tissues are normal. Sinuses/Orbits: No fluid levels or advanced mucosal thickening of the visualized paranasal sinuses. No mastoid or middle ear effusion. Normal orbits. Other: None. CT MAXILLOFACIAL FINDINGS Osseous: No facial fracture or mandibular dislocation. Orbits: The globes are intact. Normal appearance of the intra- and extraconal fat. Symmetric extraocular muscles and optic nerves. Sinuses: No fluid levels or  advanced mucosal thickening. Soft tissues: Normal visualized extracranial soft tissues. CT CERVICAL SPINE FINDINGS Alignment: No static subluxation. Facets are aligned. Occipital condyles and the lateral masses of C1-C2 are aligned. Skull base and vertebrae: No acute fracture. Soft tissues and spinal canal: No prevertebral fluid or swelling. No visible canal hematoma. Disc levels: No advanced spinal canal or neural foraminal stenosis. Upper chest: No pneumothorax, pulmonary nodule or pleural effusion. Other: Normal visualized paraspinal cervical soft tissues. IMPRESSION: 1. No acute intracranial abnormality. 2. Chronic small vessel disease. 3. No facial fracture. 4. No acute fracture or static subluxation of the cervical spine. Electronically Signed   By: Deatra Robinson M.D.   On: 10/08/2023 03:14   CT Maxillofacial Wo Contrast Result Date: 10/08/2023 CLINICAL DATA:  Fall EXAM: CT HEAD WITHOUT CONTRAST CT MAXILLOFACIAL WITHOUT CONTRAST CT CERVICAL SPINE WITHOUT CONTRAST TECHNIQUE: Multidetector CT imaging of the head, cervical spine, and maxillofacial structures were performed using the standard protocol without intravenous contrast. Multiplanar CT image reconstructions of the cervical spine and maxillofacial structures were also generated. RADIATION DOSE REDUCTION: This exam was performed according to the departmental dose-optimization program which includes automated exposure control,  adjustment of the mA and/or kV according to patient size and/or use of iterative reconstruction technique. COMPARISON:  None Available. FINDINGS: CT HEAD FINDINGS Brain: There is no mass, hemorrhage or extra-axial collection. The size and configuration of the ventricles and extra-axial CSF spaces are normal. There is hypoattenuation of the white matter, most commonly indicating chronic small vessel disease. Vascular: Atherosclerotic calcification of the internal carotid arteries at the skull base. No abnormal hyperdensity of the  major intracranial arteries or dural venous sinuses. Skull: The visualized skull base, calvarium and extracranial soft tissues are normal. Sinuses/Orbits: No fluid levels or advanced mucosal thickening of the visualized paranasal sinuses. No mastoid or middle ear effusion. Normal orbits. Other: None. CT MAXILLOFACIAL FINDINGS Osseous: No facial fracture or mandibular dislocation. Orbits: The globes are intact. Normal appearance of the intra- and extraconal fat. Symmetric extraocular muscles and optic nerves. Sinuses: No fluid levels or advanced mucosal thickening. Soft tissues: Normal visualized extracranial soft tissues. CT CERVICAL SPINE FINDINGS Alignment: No static subluxation. Facets are aligned. Occipital condyles and the lateral masses of C1-C2 are aligned. Skull base and vertebrae: No acute fracture. Soft tissues and spinal canal: No prevertebral fluid or swelling. No visible canal hematoma. Disc levels: No advanced spinal canal or neural foraminal stenosis. Upper chest: No pneumothorax, pulmonary nodule or pleural effusion. Other: Normal visualized paraspinal cervical soft tissues. IMPRESSION: 1. No acute intracranial abnormality. 2. Chronic small vessel disease. 3. No facial fracture. 4. No acute fracture or static subluxation of the cervical spine. Electronically Signed   By: Deatra Robinson M.D.   On: 10/08/2023 03:14   DG Pelvis Portable Result Date: 10/08/2023 CLINICAL DATA:  Multiple falls. EXAM: PORTABLE PELVIS 1-2 VIEWS COMPARISON:  September 29, 2023 FINDINGS: There is no evidence of pelvic fracture or diastasis. No pelvic bone lesions are seen. IMPRESSION: Negative. Electronically Signed   By: Aram Candela M.D.   On: 10/08/2023 02:37   DG Chest Port 1 View Result Date: 10/08/2023 CLINICAL DATA:  Multiple falls. EXAM: PORTABLE CHEST 1 VIEW COMPARISON:  October 05, 2023 FINDINGS: The heart size and mediastinal contours are within normal limits. Both lungs are clear. A stable, benign-appearing  sclerotic focus is seen overlying the anterior fourth right rib. The visualized skeletal structures are unremarkable. IMPRESSION: No active disease. Electronically Signed   By: Aram Candela M.D.   On: 10/08/2023 02:37    Procedures Suture Removal  Date/Time: 10/08/2023 5:06 AM  Performed by: Gloris Manchester, MD Authorized by: Gloris Manchester, MD   Consent:    Consent obtained:  Verbal   Consent given by:  Patient   Risks, benefits, and alternatives were discussed: yes     Alternatives discussed:  No treatment and delayed treatment Location:    Location:  Head/neck   Head/neck location:  Eyebrow   Eyebrow location:  L eyebrow Procedure details:    Wound appearance:  No signs of infection, good wound healing and clean   Number of sutures removed:  5 Post-procedure details:    Post-removal:  No dressing applied   Procedure completion:  Tolerated well, no immediate complications     Medications Ordered in ED Medications - No data to display  ED Course/ Medical Decision Making/ A&P                                 Medical Decision Making Amount and/or Complexity of Data Reviewed Labs: ordered. Radiology: ordered.  Risk Prescription drug  management.   This patient presents to the ED for concern of fall, this involves an extensive number of treatment options, and is a complaint that carries with it a high risk of complications and morbidity.  The differential diagnosis includes acute injuries, syncope   Co morbidities that complicate the patient evaluation  HTN, HLD, GERD, arthritis, BPH, Parkinson's disease, orthostatic hypotension   Additional history obtained:  Additional history obtained from EMS, patient's daughter External records from outside source obtained and reviewed including EMR   Lab Tests:  I Ordered, and personally interpreted labs.  The pertinent results include: Baseline hemoglobin, baseline leukocytosis, normal kidney function, normal electrolytes,  normal troponin   Imaging Studies ordered:  I ordered imaging studies including x-ray of chest and pelvis, CT scan of head, cervical spine, face, chest, abdomen, pelvis, T-spine, L-spine I independently visualized and interpreted imaging which showed no acute findings I agree with the radiologist interpretation   Cardiac Monitoring: / EKG:  The patient was maintained on a cardiac monitor.  I personally viewed and interpreted the cardiac monitored which showed an underlying rhythm of: Sinus rhythm  Problem List / ED Course / Critical interventions / Medication management  Patient presents after a fall.  It is unclear if he had a syncopal episode that preceded the fall.  Family heard him fall to the ground.  Wife stated that he had a brief witnessed LOC while on the ground, lasting only a few seconds.  Patient arrives via EMS.  Cervical collar is in place.  Although he endorsed some low back pain with EMS, he denies any currently.  He states that he has a burning sensation in his right eye.  On direct inspection, eye is normal in appearance.  Patient was placed on bedside cardiac monitor.  Workup was initiated.  Lab work and imaging studies were unremarkable.  On reassessment, patient resting comfortably.  His daughter now joins him at bedside.  Daughter states the following: Patient has 24-hour home health aide care.  He has a history of orthostatic hypotension which will cause frequent episodes of syncope and falls.  The stitches identified in the left eyebrow area are from his fall a week and a half ago.  He was due to get the stitches removed on Thursday.  Stitches were removed today in the ED.  Patient has extensive skin tears throughout his forearms and elbows.  These dressings were changed and Xeroform and Kerlix was placed.  Daughter states that he has rolling walker and wheelchair at home.  His home health aide is physically capable of helping him with transfers and catching him if needed.   Patient states that he is willing to utilize the wheelchair to minimize fall risks.  Daughter reports that they are currently on wait list for assisted living facility.  Patient and daughter were informed of reassuring workup.  Initial plan was for discharge.  When patient was getting up to get dressed and transfer to wheelchair, he had an episode where he was not able to stand with two-person assist.  Daughter states that he will have these episodes from time to time.  She describes them as a Parkinson's freeze.  Home doses of Sinemet and droxidopa were ordered.  Shared decision making discussion was had with patient's daughters.  They are in favor of a PT evaluation and social work consult today.  Patient to board in the ED pending these consults.   Social Determinants of Health:  Lives at home with wife, who has  dementia.  Has 24-hour home health aide.  Poor mobility and frequent syncopal episodes at baseline.        Final Clinical Impression(s) / ED Diagnoses Final diagnoses:  Fall, initial encounter    Rx / DC Orders ED Discharge Orders     None         Gloris Manchester, MD 10/08/23 0507    Gloris Manchester, MD 10/08/23 [phone]

## 2023-10-08 NOTE — ED Notes (Signed)
 RN and tec attempted to get pt into the wheelchair however once he was standing we were unable to assist him into the wheelchair.  He was unablet to follow any directions and even look at RN when she was speaking to him.  RN and NT managed to get him into the bed but it was very difficult as the pt was unable to move.. RN informed provider and went to the car to inform the daughter who returned bedside to speak to provider.

## 2023-10-08 NOTE — ED Notes (Signed)
 PA Laveda Norman stated okay for pt to have home droxidopa 600mg  qid

## 2023-10-08 NOTE — Progress Notes (Signed)
 PT note Pt orthostatic and symptomatic.  BP dropped significantly with position changes. See VS flowsheet.  Had to help pt back to stretcher.  Daughter in room and pt has declined over last several weeks/months with family getting 24 hour care for pt that was in I living.  Pt and wife really need higher level of care per daughter and they have been trying to get pt and wife to A living and ultimately to a memory care unit (wife with dementia and pt with confusion and Parkinsons).  At present time, PT recommends post acute rehab < 3 hours day with transition to Aliving and or memory care unit.  Will continue to follow acutely.  Full note to follow.  Iverson Sees M,PT Acute Rehab Services [phone]

## 2023-10-08 NOTE — ED Notes (Signed)
 Assisted pt to use urinal. Urine sample collected and sent off. Dressing changed.

## 2023-10-08 NOTE — TOC CM/SW Note (Addendum)
 SW met with patient, daughter and wife on facetime (patient phone) to discuss SNF placement and complete initial assessment. Patient has medicare A/B with [facility] waiver (typical needs 3 midnights to be placed from ED). Patient's daughter is his legal guardian, she had no preference for SNF's preference is [place] close to her home.   SW completed referrals to the following SNF with 3 day waiver including [facility], [address] [facility], [facility], [facility] and [facility].   FL2 started (needs to be completed) PASSAR level 2 (reference [id number]).  Lily Peer, MSW, LCSWA Transition of Care  Clinical Social Worker (ED 3-11 Mon-Fri)  [phone]

## 2023-10-08 NOTE — ED Notes (Signed)
 CCMD called for pt to begin monitoring.

## 2023-10-08 NOTE — Evaluation (Signed)
 Physical Therapy Evaluation Patient Details Name: Seth Carter MRN: [id number] DOB: 06/19/1941 Today's Date: 10/08/2023  History of Present Illness  Patient presents 3/17 after fall.  He was seen in the ED twice in the last 10 days for other falls, including 3 days ago. Medical history includes HTN, HLD, GERD, arthritis, BPH, Parkinson's disease.  Clinical Impression  Pt admitted with above diagnosis. Pt was able to stand to RW but was orthostatic with symptoms.  Definite need of +1 min assist for bed level and standing.  Per daughter, Parkinson's dz has made it more difficult for pt with incr falls.  Feel that pt will benefit from post acute rehab < 3 hours day.  Daughter ultimately trying to get pt and mother into a higher level of care such as A living.  Will continue to follow acutely.  Pt currently with functional limitations due to the deficits listed below (see PT Problem List). Pt will benefit from acute skilled PT to increase their independence and safety with mobility to allow discharge.           If plan is discharge home, recommend the following: A lot of help with walking and/or transfers;A lot of help with bathing/dressing/bathroom;Assistance with cooking/housework;Assist for transportation;Help with stairs or ramp for entrance;Supervision due to cognitive status   Can travel by private vehicle   No    Equipment Recommendations None recommended by PT  Recommendations for Other Services       Functional Status Assessment Patient has had a recent decline in their functional status and demonstrates the ability to make significant improvements in function in a reasonable and predictable amount of time.     Precautions / Restrictions Precautions Precautions: Fall Restrictions Weight Bearing Restrictions Per Provider Order: No      Mobility  Bed Mobility Overal bed mobility: Needs Assistance Bed Mobility: Supine to Sit     Supine to sit: Min assist     General bed  mobility comments: Needed assist and cues for coming to edge of stretcher    Transfers Overall transfer level: Needs assistance Equipment used: Rolling walker (2 wheels) Transfers: Sit to/from Stand Sit to Stand: Min assist           General transfer comment: Pt able to stand to RW but could not take steps as he was orthostatic and symptomatic.  Needed min assist to stand with RW support.    Ambulation/Gait                  Stairs            Wheelchair Mobility     Tilt Bed    Modified Rankin (Stroke Patients Only)       Balance Overall balance assessment: Needs assistance Sitting-balance support: No upper extremity supported, Feet supported Sitting balance-Leahy Scale: Fair     Standing balance support: Bilateral upper extremity supported, During functional activity, Reliant on assistive device for balance Standing balance-Leahy Scale: Poor Standing balance comment: relies on UE support for balance.                             Pertinent Vitals/Pain Pain Assessment Pain Assessment: No/denies pain    Home Living Family/patient expects to be discharged to:: Private residence Living Arrangements: Spouse/significant other Available Help at Discharge: Family;Available 24 hours/day (wife with dementia) Type of Home: Apartment Home Access: Level entry       Home Layout: One level Home  Equipment: Grab bars - toilet;Grab bars - tub/shower;Toilet riser;Rollator (4 wheels);Wheelchair - manual;Hand held shower head;Shower seat Additional Comments: [facility] PTA with 24 hour care for last several weeks prior to this admit; on wait list for A living and ultimately needs memory care    Prior Function Prior Level of Function : Needs assist             Mobility Comments: Parkinsons worsening, has fallen with rollator and without ADLs Comments: 24 hour aide for last few weeks per daughter     Extremity/Trunk Assessment    Upper Extremity Assessment Upper Extremity Assessment: Defer to OT evaluation    Lower Extremity Assessment Lower Extremity Assessment: Generalized weakness    Cervical / Trunk Assessment Cervical / Trunk Assessment: Kyphotic  Communication   Communication Communication: Other (comment) (delayed in his word finding but able to communicate needs.)    Cognition Arousal: Alert Behavior During Therapy: Flat affect   PT - Cognitive impairments: History of cognitive impairments, Sequencing, Problem solving, Safety/Judgement                         Following commands: Impaired Following commands impaired: Follows one step commands inconsistently, Follows one step commands with increased time     Cueing Cueing Techniques: Verbal cues, Tactile cues     General Comments General comments (skin integrity, edema, etc.): 60 bpm, 132/66; sitting 64 bpm, 88/57; standing 65 bpm, 74/54 therefore laid pt back down as he was symptomatic    Exercises General Exercises - Lower Extremity Ankle Circles/Pumps: AROM, Both, 10 reps, Seated Long Arc Quad: AROM, Both, 5 reps, Seated   Assessment/Plan    PT Assessment Patient needs continued PT services  PT Problem List Decreased activity tolerance;Decreased balance;Decreased mobility;Decreased knowledge of use of DME;Decreased safety awareness;Cardiopulmonary status limiting activity       PT Treatment Interventions DME instruction;Gait training;Functional mobility training;Therapeutic activities;Therapeutic exercise;Balance training;Patient/family education    PT Goals (Current goals can be found in the Care Plan section)  Acute Rehab PT Goals Patient Stated Goal: to go to SNF for therapy PT Goal Formulation: With family Time For Goal Achievement: 10/22/23 Potential to Achieve Goals: Fair    Frequency Min 2X/week     Co-evaluation               AM-PAC PT "6 Clicks" Mobility  Outcome Measure Help needed turning from your  back to your side while in a flat bed without using bedrails?: A Little Help needed moving from lying on your back to sitting on the side of a flat bed without using bedrails?: A Little Help needed moving to and from a bed to a chair (including a wheelchair)?: A Little Help needed standing up from a chair using your arms (e.g., wheelchair or bedside chair)?: A Little Help needed to walk in hospital room?: Total Help needed climbing 3-5 steps with a railing? : Total 6 Click Score: 14    End of Session Equipment Utilized During Treatment: Gait belt Activity Tolerance: Patient limited by fatigue;Other (comment) (orthostasis) Patient left: with call bell/phone within reach;with bed alarm set;with family/visitor present Nurse Communication: Mobility status PT Visit Diagnosis: Unsteadiness on feet (R26.81);Muscle weakness (generalized) (M62.81)    Time: 1610-9604 PT Time Calculation (min) (ACUTE ONLY): 23 min   Charges:   PT Evaluation $PT Eval Moderate Complexity: 1 Mod PT Treatments $Therapeutic Activity: 8-22 mins PT General Charges $$ ACUTE PT VISIT: 1 Visit  [facility] M,PT Acute Rehab Services [phone]   Bevelyn Buckles 10/08/2023, 12:58 PM

## 2023-10-08 NOTE — Discharge Instructions (Signed)
 Test results today are reassuring.  Imaging studies do not show any new injuries.  Continue keeping areas of skin tears clean and dry.  Minimize unsupervised time on feet to decrease fall risk.  Utilize wheelchair.  Utilize home health aide to assist with transferring.  Return to the emergency department for any new or worsening symptoms of concern.

## 2023-10-08 NOTE — ED Triage Notes (Signed)
 Per EMS, pt from [facility], fell today trying to go to the bathroom.  Wife heard him fall, states he could have passed out but was quick to wake up, wife poor historian per EMS. Hx of orthostatic hypotension.  He had a fall on 3/14 as well, wife states he has declined since then.  EMS reports he is A/O X4, remembers this fall but not last fall.  No thinners.  Pt only complains of lower back pain, he also has a skin tear to right elbow.    182/73 HR 58 99% RA, clear lungs

## 2023-10-09 ENCOUNTER — Emergency Department (HOSPITAL_COMMUNITY)

## 2023-10-09 DIAGNOSIS — S51011A Laceration without foreign body of right elbow, initial encounter: Secondary | ICD-10-CM | POA: Diagnosis not present

## 2023-10-09 DIAGNOSIS — M79671 Pain in right foot: Secondary | ICD-10-CM | POA: Diagnosis not present

## 2023-10-09 DIAGNOSIS — M2011 Hallux valgus (acquired), right foot: Secondary | ICD-10-CM | POA: Diagnosis not present

## 2023-10-09 DIAGNOSIS — M7661 Achilles tendinitis, right leg: Secondary | ICD-10-CM | POA: Diagnosis not present

## 2023-10-09 DIAGNOSIS — M19071 Primary osteoarthritis, right ankle and foot: Secondary | ICD-10-CM | POA: Diagnosis not present

## 2023-10-09 MED ORDER — ADULT MULTIVITAMIN W/MINERALS CH
1.0000 | ORAL_TABLET | Freq: Every day | ORAL | Status: DC
  Administered 2023-10-09 – 2023-10-10 (×2): 1 via ORAL
  Filled 2023-10-09 (×2): qty 1

## 2023-10-09 MED ORDER — ROSUVASTATIN CALCIUM 5 MG PO TABS
10.0000 mg | ORAL_TABLET | Freq: Every day | ORAL | Status: DC
  Administered 2023-10-09: 10 mg via ORAL
  Filled 2023-10-09: qty 2

## 2023-10-09 MED ORDER — ASPIRIN 81 MG PO TBEC
81.0000 mg | DELAYED_RELEASE_TABLET | Freq: Every day | ORAL | Status: DC
  Administered 2023-10-09 – 2023-10-10 (×2): 81 mg via ORAL
  Filled 2023-10-09 (×2): qty 1

## 2023-10-09 MED ORDER — SERTRALINE HCL 50 MG PO TABS
50.0000 mg | ORAL_TABLET | Freq: Every day | ORAL | Status: DC
  Administered 2023-10-09 – 2023-10-10 (×2): 50 mg via ORAL
  Filled 2023-10-09 (×2): qty 1

## 2023-10-09 MED ORDER — MIRABEGRON ER 50 MG PO TB24
50.0000 mg | ORAL_TABLET | Freq: Every day | ORAL | Status: DC
  Administered 2023-10-09: 50 mg via ORAL
  Filled 2023-10-09 (×3): qty 1

## 2023-10-09 MED ORDER — LATANOPROST 0.005 % OP SOLN
1.0000 [drp] | Freq: Every day | OPHTHALMIC | Status: DC
  Filled 2023-10-09 (×2): qty 2.5

## 2023-10-09 MED ORDER — DORZOLAMIDE HCL-TIMOLOL MAL 2-0.5 % OP SOLN
1.0000 [drp] | Freq: Two times a day (BID) | OPHTHALMIC | Status: DC
  Administered 2023-10-09: 1 [drp] via OPHTHALMIC
  Filled 2023-10-09: qty 10

## 2023-10-09 NOTE — ED Notes (Signed)
 Pt pocketing pills and coughing. Speech consult placed

## 2023-10-09 NOTE — Progress Notes (Signed)
 CSW presented [facility] bed offer and both daughters stated they preferred pt faxed out to [facility] area due to distance from their homes. CSW has expanded search and advised daughters to review Medicare.gov to aid in their decision making.   Daughters inquired about [facility] in [place] and CSW informed that facility is not an eligible facility on [address].  Informed that if pt did discharge there, it would not be billed through his insurance and would require private pay. Daughter stated that is not an option.CSW informed that pt is not meeting medical criteria for admission at this time. CSW also informed pt will remain in ED. TOC following for bed offers.

## 2023-10-09 NOTE — Evaluation (Signed)
 Clinical/Bedside Swallow Evaluation Patient Details  Name: Seth Carter MRN: [id number] Date of Birth: 06-28-1941  Today's Date: 10/09/2023 Time: SLP Start Time (ACUTE ONLY): 1113 SLP Stop Time (ACUTE ONLY): 1125 SLP Time Calculation (min) (ACUTE ONLY): 12 min  Past Medical History:  Past Medical History:  Diagnosis Date   Acute pharyngitis    AKI (acute kidney injury) 09/29/2020   Allergic rhinitis    Benign prostatic hyperplasia with lower urinary tract symptoms 09/23/2020   Bradykinesia    Cervical spondylosis without myelopathy 12/18/2019   Chronic back pain    Chronic sinusitis    Degeneration of lumbar intervertebral disc 08/20/2020   ED (erectile dysfunction)    Enlarged prostate 12/15/2020   Essential hypertension    nuclear test in epic 01-16-2020 normal perfusion no ischemia, normal lvf and wall motion, nuclear ef 65%   Family history of hemochromatosis    Fatigue    Gastroesophageal reflux disease without esophagitis 12/18/2019   Generalized weakness    Glaucoma, both eyes    Hardening of the aorta (main artery of the heart)    Hemorrhoids    History of syncope    cardiology evaluation by [name], note in epic 12-26-2019, work-up includes event monitor 02-04-2020( showed SR with occ PAC & PVC rare PAT)/ echo  12-26-2019 (mild LVH, ef 50-55%, moderate MR, mild to moderate AV sclerosis no stenosis, ascending aorta 39mm) and nuclear test 01-16-2020 (normal perfusion w/ normal LV and wall motion, nuclear ef 65%)   Hyperglycemia 12/18/2019   Hypogonadism in male    Leukocytosis 09/29/2020   Macular degeneration, right eye    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   Mixed hyperlipidemia    Onychomycosis 12/18/2019   Osteoarthritis of knee 12/18/2019   Pain in hand 12/18/2019   Parkinson's disease 08/2020   Renal stone 12/18/2019   Sebaceous cyst 12/18/2019   Seizure-like activity 09/29/2020   Spinal stenosis of lumbar region 08/20/2020   Testicular  hypofunction 12/18/2019   Toxic effect of venom of wasps 12/18/2019   Vitamin B12 deficiency    Vitamin D deficiency    Past Surgical History:  Past Surgical History:  Procedure Laterality Date   ANKLE SURGERY Right 10-04-2015  @[facility]    debridement achilles tendon & reattachment and heel excision osteophyte   CATARACT EXTRACTION W/ INTRAOCULAR LENS  IMPLANT, BILATERAL  yrs ago   CYSTOSCOPY WITH INSERTION OF UROLIFT  2019 approx.   SHOULDER SURGERY Right 1990's   TONSILLECTOMY  age 46   TRANSURETHRAL RESECTION OF PROSTATE N/A 09/23/2020   Procedure: TRANSURETHRAL RESECTION OF THE PROSTATE (TURP);  Surgeon: [name], MD;  Location: [facility];  Service: Urology;  Laterality: N/A;  58 MINS   HPI:  Seth Carter is an 83 yo male presenting to ED 3/17 after a fall. He was seen in the ED twice in the last 10 days for other falls, most recently 3 days ago. PMH includes HTN, HLD, GERD, arthritis, BPH, Parkinson's disease, cognitive decline    Assessment / Plan / Recommendation  Clinical Impression  SLP consulted due to RN concern for pocketing oral meds. Pt's daughter reports similar instances in the past thought to be secondary to cognitive factors, during which they have administered meds crushed in puree. Observed pt with trials of thin liquids, purees, and solids without overt s/s of dysphagia or aspiration. When masticating solids, pt stated that he recently had teeth removed and is more diligent about masticating thoroughly. Education was  provided regarding the impact of cognition on oral dysphagia. Recommend pt continue regular diet with thin liquids. He may benefit from full supervision to monitor for oral holding and pocketing. Consider giving pills crushed in puree as needed. No further SLP f/u is needed for swallowing, but ongoing intervention targeting voice may be warranted as he was actively participating in therapy PTA. SLP Visit Diagnosis: Dysphagia, unspecified  (R13.10)    Aspiration Risk  Mild aspiration risk    Diet Recommendation Regular;Thin liquid    Liquid Administration via: Cup;Straw Medication Administration: Whole meds with liquid (crushed in puree PRN when pocketing noted) Supervision: Patient able to self feed;Full supervision/cueing for compensatory strategies Compensations: Minimize environmental distractions;Slow rate;Small sips/bites;Lingual sweep for clearance of pocketing Postural Changes: Seated upright at 90 degrees;Remain upright for at least 30 minutes after po intake    Other  Recommendations Oral Care Recommendations: Oral care BID    Recommendations for follow up therapy are one component of a multi-disciplinary discharge planning process, led by the attending physician.  Recommendations may be updated based on patient status, additional functional criteria and insurance authorization.  Follow up Recommendations Skilled nursing-short term rehab (<3 hours/day) (for voice 2/2 PD)      Assistance Recommended at Discharge    Functional Status Assessment Patient has not had a recent decline in their functional status  Frequency and Duration            Prognosis Prognosis for improved oropharyngeal function: Good Barriers to Reach Goals: Cognitive deficits;Time post onset      Swallow Study   General HPI: Seth Carter is an 83 yo male presenting to ED 3/17 after a fall. He was seen in the ED twice in the last 10 days for other falls, most recently 3 days ago. PMH includes HTN, HLD, GERD, arthritis, BPH, Parkinson's disease, cognitive decline Type of Study: Bedside Swallow Evaluation Previous Swallow Assessment: none in chart Diet Prior to this Study: Regular;Thin liquids (Level 0) Temperature Spikes Noted: No Respiratory Status: Room air History of Recent Intubation: No Behavior/Cognition: Alert;Cooperative Oral Cavity Assessment: Within Functional Limits Oral Care Completed by SLP: No Oral Cavity - Dentition:  Adequate natural dentition Vision: Functional for self-feeding Self-Feeding Abilities: Able to feed self;Needs set up Patient Positioning: Upright in bed Baseline Vocal Quality: Normal Volitional Cough: Strong Volitional Swallow: Able to elicit    Oral/Motor/Sensory Function Overall Oral Motor/Sensory Function: Generalized oral weakness   Ice Chips Ice chips: Not tested   Thin Liquid Thin Liquid: Within functional limits Presentation: Straw;Self Fed    Nectar Thick Nectar Thick Liquid: Not tested   Honey Thick Honey Thick Liquid: Not tested   Puree Puree: Within functional limits Presentation: Spoon;Self Fed   Solid     Solid: Within functional limits Presentation: Self Fed      Gwynneth Aliment, M.A., CF-SLP Speech Language Pathology, Acute Rehabilitation Services  Secure Chat preferred [phone]  10/09/2023,11:44 AM

## 2023-10-09 NOTE — ED Notes (Signed)
 Pt has been restless throughout the night. Able to be reoriented and redirected. Bed alarm in use.

## 2023-10-09 NOTE — [place] FL2 (Addendum)
 [place] MEDICAID FL2 LEVEL OF CARE FORM     IDENTIFICATION  Patient Name: Seth Carter Birthdate: Apr 06, 1941 Sex: male Admission Date (Current Location): 10/08/2023  [facility] [place] Number:  Producer, television/film/video and Address:  [facility], [address] [place], [place] [address]      Provider Number: [id number]  Attending Physician Name and Address:  Tegeler, Canary Brim, MD  Relative Name and Phone Number:  Darol Destine (Daughter)  [phone] (Mobile)    Current Level of Care: Hospital Recommended Level of Care: Skilled Nursing Facility Prior Approval Number:    Date Approved/Denied:   PASRR Number: Pending Level 2 (Reference [id number])  Discharge Plan: SNF    Current Diagnoses: Patient Active Problem List   Diagnosis Date Noted   Stroke-like symptoms 12/30/2022   Orthostatic hypotension 12/30/2022   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   Allergic rhinitis    Chronic sinusitis    Enlarged prostate    Hardening of the aorta (main artery of the heart)    Vitamin B12 deficiency    Seizure-like activity 09/29/2020   Parkinson's disease (HCC) 09/29/2020   Leukocytosis 09/29/2020   Benign prostatic hyperplasia with lower urinary tract symptoms 09/23/2020   Degeneration of lumbar intervertebral disc 08/20/2020   Spinal stenosis of lumbar region 08/20/2020   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/2019   Hemorrhoids 12/18/2019   Vitamin D deficiency 12/18/2019   Erectile dysfunction 12/18/2019   Osteoarthritis of knee 12/18/2019   Impaired fasting glucose 12/18/2019   Gastroesophageal reflux disease without esophagitis 12/18/2019   Essential hypertension 12/18/2019   Mixed hyperlipidemia 12/18/2019   Fatigue 12/18/2019   Pain in hand 12/18/2019   Onychomycosis 12/18/2019   Cervical spondylosis without myelopathy 12/18/2019   Toxic effect of venom of wasps 12/18/2019   Right flank pain 12/18/2019   Family  history of hemochromatosis 12/18/2019   Pure hypercholesterolemia 12/18/2019   Hyperglycemia 12/18/2019   Shoulder joint pain 01/29/2019    Orientation RESPIRATION BLADDER Height & Weight     Self, Time, Situation  Normal Continent Weight: 175 lb 0.7 oz (79.4 kg) Height:  5\' 11"  (180.3 cm)  BEHAVIORAL SYMPTOMS/MOOD NEUROLOGICAL BOWEL NUTRITION STATUS    Convulsions/Seizures (Parkinsons) Continent Diet  AMBULATORY STATUS COMMUNICATION OF NEEDS Skin   Extensive Assist Verbally Normal                       Personal Care Assistance Level of Assistance  Feeding, Bathing, Dressing Bathing Assistance: Maximum assistance Feeding assistance: Independent Dressing Assistance: Maximum assistance     Functional Limitations Info  Sight, Hearing, Speech (When his blood pressure drops his vision becomes blurry) Sight Info: Adequate Hearing Info: Adequate Speech Info: Adequate    SPECIAL CARE FACTORS FREQUENCY  PT (By licensed PT)     PT Frequency: 5x/weekly              Contractures Contractures Info: Not present    Additional Factors Info  Code Status, Allergies Code Status Info: Full Allergies Info: Cialis (Tadalafil)  Pneumovax (Pneumococcal Polysaccharide Vaccine)  Zocor (Simvastatin)  Ace Inhibitors  Contrast Media (Iodinated Contrast Media)           Current Medications (10/09/2023):  This is the current hospital active medication list Current Facility-Administered Medications  Medication Dose Route Frequency Provider Last Rate Last Admin   aspirin EC tablet 81 mg  81 mg Oral Daily Elayne Snare K, DO       carbidopa-levodopa (  SINEMET IR) 25-100 MG per tablet immediate release 2 tablet  2 tablet Oral TID Gloris Manchester, MD   2 tablet at 10/08/23 2123   dorzolamide-timolol (COSOPT) 2-0.5 % ophthalmic solution 1 drop  1 drop Both Eyes BID Elayne Snare K, DO       droxidopa [facility]) capsule 500 mg  500 mg Oral TID Gloris Manchester, MD   500 mg at 10/08/23 2153    latanoprost (XALATAN) 0.005 % ophthalmic solution 1 drop  1 drop Both Eyes QHS Kingsley, Victoria K, DO       mirabegron ER [facility]) tablet 50 mg  50 mg Oral Daily Elayne Snare K, DO       multivitamin with minerals tablet 1 tablet  1 tablet Oral Daily Kingsley, Victoria K, DO       rosuvastatin (CRESTOR) tablet 10 mg  10 mg Oral QHS Kingsley, Victoria K, DO       sertraline (ZOLOFT) tablet 50 mg  50 mg Oral Daily Rexford Maus, DO       Current Outpatient Medications  Medication Sig Dispense Refill   aspirin EC 81 MG tablet Take 81 mg by mouth at bedtime.     carbidopa-levodopa (SINEMET IR) 25-100 MG tablet Take 2 tablets by mouth 3 (three) times daily.     cetirizine (ZYRTEC) 10 MG tablet Take 10 mg by mouth daily.     Cyanocobalamin (VITAMIN B-12 PO) Take 1 tablet by mouth daily with lunch.     dorzolamide-timolol (COSOPT) 22.3-6.8 MG/ML ophthalmic solution Place 1 drop into both eyes 2 (two) times daily.     droxidopa (NORTHERA) 300 MG CAPS Take 300 mg by mouth 3 (three) times daily with meals.     latanoprost (XALATAN) 0.005 % ophthalmic solution Place 1 drop into both eyes at bedtime.     MAGNESIUM OXIDE PO Take 1 tablet by mouth at bedtime.     mirabegron ER (MYRBETRIQ) 50 MG TB24 tablet Take 50 mg by mouth daily.     Multiple Vitamin (MULTIVITAMIN WITH MINERALS) TABS tablet Take 1 tablet by mouth daily.     Multiple Vitamins-Minerals (PRESERVISION/LUTEIN) CAPS Take 1 capsule by mouth daily with lunch.     Polyethyl Glycol-Propyl Glycol 0.4-0.3 % SOLN Place 1 application  into both eyes daily.     Propylene Glycol (SYSTANE COMPLETE PF OP) Place 1 drop into both eyes 2 (two) times daily.     rosuvastatin (CRESTOR) 10 MG tablet Take 10 mg by mouth at bedtime.     sertraline (ZOLOFT) 50 MG tablet Take 50 mg by mouth daily.       Discharge Medications: Please see discharge summary for a list of discharge medications.  Relevant Imaging Results:  Relevant Lab  Results:   Additional Information SSN: [id number]  Princella Ion, LCSW

## 2023-10-09 NOTE — Progress Notes (Signed)
 CSW presented bed offer ([facility]) to daughter - they accepted offer. [facility] will have bed available tomorrow. Daughter reports her sister removed pt's sock and pt's foot is "black and blue" and states her sister has notified the doctor and they are uncertain what will take place moving forward. CSW will outreach to MD to request update on care plan.

## 2023-10-09 NOTE — ED Provider Notes (Addendum)
 Emergency Medicine Observation Re-evaluation Note  Seth Carter is a 83 y.o. male, seen on rounds today.  Pt initially presented to the ED for complaints of Fall Currently, the patient is resting in his bed without acute distress.  Physical Exam  BP 138/74   Pulse 68   Temp 97.9 F (36.6 C) (Oral)   Resp 18   Ht 5\' 11"  (1.803 m)   Wt 79.4 kg   SpO2 96%   BMI 24.41 kg/m  Physical Exam General: Resting without acute agitation Cardiac: No murmur initially Lungs: Clear breath sounds bilaterally Psych: No agitation  ED Course / MDM  EKG:EKG Interpretation Date/Time:  Monday October 08 2023 02:01:47 EDT Ventricular Rate:  59 PR Interval:  195 QRS Duration:  98 QT Interval:  442 QTC Calculation: 438 R Axis:   55  Text Interpretation: Sinus rhythm Borderline repolarization abnormality Confirmed by Gloris Manchester ([id number]) on 10/08/2023 3:11:25 AM  I have reviewed the labs performed to date as well as medications administered while in observation.  Recent changes in the last 24 hours include none reported by overnight nursing.  Plan  Current plan is for awaiting placement.    Barbar Brede, Canary Brim, MD 10/09/23 0847  11:28 AM Patient was reporting pain in his right foot with some bruising.  He is unsure if this was from the recent fall or falls over the last week but he does indeed have bruising and swelling and tenderness on his foot and toes on the right foot.  Will get x-ray to assess.    Candence Sease, Canary Brim, MD 10/09/23 [phone]

## 2023-10-09 NOTE — ED Notes (Signed)
 Assumed pt care.

## 2023-10-09 NOTE — Progress Notes (Addendum)
 Bed offers and pasrr pending.   Addend @ 9:29AM CSW uploaded Level II Release to PASRR.

## 2023-10-10 DIAGNOSIS — R531 Weakness: Secondary | ICD-10-CM | POA: Diagnosis not present

## 2023-10-10 DIAGNOSIS — G4089 Other seizures: Secondary | ICD-10-CM | POA: Diagnosis not present

## 2023-10-10 DIAGNOSIS — S51011A Laceration without foreign body of right elbow, initial encounter: Secondary | ICD-10-CM | POA: Diagnosis not present

## 2023-10-10 DIAGNOSIS — S01112D Laceration without foreign body of left eyelid and periocular area, subsequent encounter: Secondary | ICD-10-CM | POA: Diagnosis not present

## 2023-10-10 DIAGNOSIS — I639 Cerebral infarction, unspecified: Secondary | ICD-10-CM | POA: Diagnosis not present

## 2023-10-10 DIAGNOSIS — F419 Anxiety disorder, unspecified: Secondary | ICD-10-CM | POA: Diagnosis not present

## 2023-10-10 DIAGNOSIS — R410 Disorientation, unspecified: Secondary | ICD-10-CM | POA: Diagnosis not present

## 2023-10-10 DIAGNOSIS — I679 Cerebrovascular disease, unspecified: Secondary | ICD-10-CM | POA: Diagnosis not present

## 2023-10-10 DIAGNOSIS — G20C Parkinsonism, unspecified: Secondary | ICD-10-CM | POA: Diagnosis not present

## 2023-10-10 DIAGNOSIS — S51811D Laceration without foreign body of right forearm, subsequent encounter: Secondary | ICD-10-CM | POA: Diagnosis not present

## 2023-10-10 DIAGNOSIS — R600 Localized edema: Secondary | ICD-10-CM | POA: Diagnosis not present

## 2023-10-10 DIAGNOSIS — R41841 Cognitive communication deficit: Secondary | ICD-10-CM | POA: Diagnosis not present

## 2023-10-10 DIAGNOSIS — R278 Other lack of coordination: Secondary | ICD-10-CM | POA: Diagnosis not present

## 2023-10-10 DIAGNOSIS — G9340 Encephalopathy, unspecified: Secondary | ICD-10-CM | POA: Diagnosis not present

## 2023-10-10 DIAGNOSIS — S299XXA Unspecified injury of thorax, initial encounter: Secondary | ICD-10-CM | POA: Diagnosis not present

## 2023-10-10 DIAGNOSIS — M6281 Muscle weakness (generalized): Secondary | ICD-10-CM | POA: Diagnosis not present

## 2023-10-10 DIAGNOSIS — E119 Type 2 diabetes mellitus without complications: Secondary | ICD-10-CM | POA: Diagnosis not present

## 2023-10-10 DIAGNOSIS — M79672 Pain in left foot: Secondary | ICD-10-CM | POA: Diagnosis not present

## 2023-10-10 DIAGNOSIS — G20A1 Parkinson's disease without dyskinesia, without mention of fluctuations: Secondary | ICD-10-CM | POA: Diagnosis not present

## 2023-10-10 DIAGNOSIS — E785 Hyperlipidemia, unspecified: Secondary | ICD-10-CM | POA: Diagnosis not present

## 2023-10-10 DIAGNOSIS — S51812D Laceration without foreign body of left forearm, subsequent encounter: Secondary | ICD-10-CM | POA: Diagnosis not present

## 2023-10-10 DIAGNOSIS — I10 Essential (primary) hypertension: Secondary | ICD-10-CM | POA: Diagnosis not present

## 2023-10-10 DIAGNOSIS — R1312 Dysphagia, oropharyngeal phase: Secondary | ICD-10-CM | POA: Diagnosis not present

## 2023-10-10 DIAGNOSIS — F068 Other specified mental disorders due to known physiological condition: Secondary | ICD-10-CM | POA: Diagnosis not present

## 2023-10-10 DIAGNOSIS — Z7401 Bed confinement status: Secondary | ICD-10-CM | POA: Diagnosis not present

## 2023-10-10 DIAGNOSIS — G903 Multi-system degeneration of the autonomic nervous system: Secondary | ICD-10-CM | POA: Diagnosis not present

## 2023-10-10 DIAGNOSIS — S3991XA Unspecified injury of abdomen, initial encounter: Secondary | ICD-10-CM | POA: Diagnosis not present

## 2023-10-10 DIAGNOSIS — R2689 Other abnormalities of gait and mobility: Secondary | ICD-10-CM | POA: Diagnosis not present

## 2023-10-10 DIAGNOSIS — F0280 Dementia in other diseases classified elsewhere without behavioral disturbance: Secondary | ICD-10-CM | POA: Diagnosis not present

## 2023-10-10 DIAGNOSIS — Z515 Encounter for palliative care: Secondary | ICD-10-CM | POA: Diagnosis not present

## 2023-10-10 DIAGNOSIS — F32A Depression, unspecified: Secondary | ICD-10-CM | POA: Diagnosis not present

## 2023-10-10 NOTE — ED Notes (Signed)
 Ptar called eta 1 hour

## 2023-10-10 NOTE — ED Notes (Signed)
 The patient's daughter would like to speak to the nurse concerning discharge and transportation. [phone].

## 2023-10-10 NOTE — Progress Notes (Signed)
 Patient can be discharged to [facility], room 611 today. Patient will travel via [facility] - RN to call when ready. The number for report is [phone].  Edwin Dada, MSW, LCSW Transitions of Care  Clinical Social Worker II [phone]

## 2023-10-10 NOTE — ED Provider Notes (Signed)
 Emergency Medicine Observation Re-evaluation Note  Seth Carter is a 83 y.o. male, seen on rounds today.  Pt initially presented to the ED for complaints of Fall Currently, the patient is resting no complaints.  Physical Exam  BP (!) 173/79   Pulse 70   Temp 97.7 F (36.5 C)   Resp 20   Ht 5\' 11"  (1.803 m)   Wt 79.4 kg   SpO2 100%   BMI 24.41 kg/m  Physical Exam General: Awake and alert  ED Course / MDM  EKG:EKG Interpretation Date/Time:  Monday October 08 2023 02:01:47 EDT Ventricular Rate:  59 PR Interval:  195 QRS Duration:  98 QT Interval:  442 QTC Calculation: 438 R Axis:   55  Text Interpretation: Sinus rhythm Borderline repolarization abnormality Confirmed by Gloris Manchester ([id number]) on 10/08/2023 3:11:25 AM  I have reviewed the labs performed to date as well as medications adminisered while in observation.  Recent changes in the last 24 hours include less than.  Plan  Current plan is for patient discharged to [facility].Lockie Mola, Janie Strothman, DO 10/10/23 1006

## 2023-10-11 DIAGNOSIS — E785 Hyperlipidemia, unspecified: Secondary | ICD-10-CM | POA: Diagnosis not present

## 2023-10-11 DIAGNOSIS — G4089 Other seizures: Secondary | ICD-10-CM | POA: Diagnosis not present

## 2023-10-11 DIAGNOSIS — I10 Essential (primary) hypertension: Secondary | ICD-10-CM | POA: Diagnosis not present

## 2023-10-11 DIAGNOSIS — E119 Type 2 diabetes mellitus without complications: Secondary | ICD-10-CM | POA: Diagnosis not present

## 2023-10-15 DIAGNOSIS — E785 Hyperlipidemia, unspecified: Secondary | ICD-10-CM | POA: Diagnosis not present

## 2023-10-15 DIAGNOSIS — E119 Type 2 diabetes mellitus without complications: Secondary | ICD-10-CM | POA: Diagnosis not present

## 2023-10-15 DIAGNOSIS — I10 Essential (primary) hypertension: Secondary | ICD-10-CM | POA: Diagnosis not present

## 2023-10-15 DIAGNOSIS — G4089 Other seizures: Secondary | ICD-10-CM | POA: Diagnosis not present

## 2023-10-16 DIAGNOSIS — Z515 Encounter for palliative care: Secondary | ICD-10-CM | POA: Diagnosis not present

## 2023-10-22 ENCOUNTER — Other Ambulatory Visit: Payer: Self-pay | Admitting: *Deleted

## 2023-10-22 NOTE — Patient Outreach (Signed)
 [facility] SNF waiver utilized for admission to [facility] SNF.  Screening for potential complex care management services as benefit of health plan and primary care provider.  Collaboration with D[place], SNF Child psychotherapist. Family is working with the business office for potentially staying at [facility] for LTC.   Will continue to follow.  Raiford Noble, MSN, RN, BSN [place]  [facility], [facility] RN Post- Acute Care Manager Direct Dial: [phone]

## 2023-10-23 DIAGNOSIS — R600 Localized edema: Secondary | ICD-10-CM | POA: Diagnosis not present

## 2023-10-23 DIAGNOSIS — I10 Essential (primary) hypertension: Secondary | ICD-10-CM | POA: Diagnosis not present

## 2023-10-23 DIAGNOSIS — E785 Hyperlipidemia, unspecified: Secondary | ICD-10-CM | POA: Diagnosis not present

## 2023-10-23 DIAGNOSIS — R410 Disorientation, unspecified: Secondary | ICD-10-CM | POA: Diagnosis not present

## 2023-10-24 DIAGNOSIS — M79672 Pain in left foot: Secondary | ICD-10-CM | POA: Diagnosis not present

## 2023-10-25 DIAGNOSIS — I10 Essential (primary) hypertension: Secondary | ICD-10-CM | POA: Diagnosis not present

## 2023-10-25 DIAGNOSIS — G4089 Other seizures: Secondary | ICD-10-CM | POA: Diagnosis not present

## 2023-10-25 DIAGNOSIS — E785 Hyperlipidemia, unspecified: Secondary | ICD-10-CM | POA: Diagnosis not present

## 2023-10-25 DIAGNOSIS — E119 Type 2 diabetes mellitus without complications: Secondary | ICD-10-CM | POA: Diagnosis not present

## 2023-10-25 NOTE — Progress Notes (Signed)
 Necessary for patient work up and diagnosis

## 2023-11-08 DIAGNOSIS — E119 Type 2 diabetes mellitus without complications: Secondary | ICD-10-CM | POA: Diagnosis not present

## 2023-11-08 DIAGNOSIS — G4089 Other seizures: Secondary | ICD-10-CM | POA: Diagnosis not present

## 2023-11-08 DIAGNOSIS — E785 Hyperlipidemia, unspecified: Secondary | ICD-10-CM | POA: Diagnosis not present

## 2023-11-08 DIAGNOSIS — I10 Essential (primary) hypertension: Secondary | ICD-10-CM | POA: Diagnosis not present

## 2023-11-12 DIAGNOSIS — G20A1 Parkinson's disease without dyskinesia, without mention of fluctuations: Secondary | ICD-10-CM | POA: Diagnosis not present

## 2023-11-12 DIAGNOSIS — F068 Other specified mental disorders due to known physiological condition: Secondary | ICD-10-CM | POA: Diagnosis not present

## 2023-11-12 DIAGNOSIS — G903 Multi-system degeneration of the autonomic nervous system: Secondary | ICD-10-CM | POA: Diagnosis not present

## 2023-11-12 DIAGNOSIS — F0280 Dementia in other diseases classified elsewhere without behavioral disturbance: Secondary | ICD-10-CM | POA: Diagnosis not present

## 2023-11-12 NOTE — Progress Notes (Signed)
 [facility]  Date of Clinic Appointment: 11/12/2023  Seth Carter [id number] January 04, 1941 [address] [place] [address]    Reason for Visit:  Chief Complaint  Patient presents with  . Abnormal Movements    Initial History of Present Illness from 01/31/2021 from Dr. Pablo: Seth Carter reports that he first developed symptoms that were concerning to him in early 2020.  This involved posture and gait changes, moving more slowly, cognitive slowing, worsening fine motor skills, and slowness of speech.  His PCP referred him to neurologist Dr. Asberry Carter, who has comprehensively reviewed his cognitive and movement symptoms, diagnosing him with Parkinson's disease earlier this year.  He has seen Dr. Richie 09/2020 for neuropsychological testing, which revealed mild cognitive impairment with risk for Parkinson's disease dementia.  He has been started on Sinemet , which was briefly discontinued in the setting of a complicated hospitalization this spring--he was admitted following a syncopal episode, and was noted to have a rash which was ultimately attributed to IV contrast from a CT scan.  There was concern for possible allergy to Sinemet , and although he did see an allergist who felt that allergy was unlikely.  He was restarted on Sinemet  10/2020 along with a 6-week steroid coadministration, and has been tolerating Sinemet  well since then.   PD History Diagnosed with PD: Dr. Asberry Carter 2022 Initial symptoms and laterality: early 2020, cognitive impairment, left hemibody bradykinesia > rigidity, possible tremor Trial of levodopa : responsive   Sinemet  dosing currently is 1 tab TID at 9:30, 14:30, 22:30. Dose this AM 9:20. Helps with bradykinesia   Subsequent Changes/Pertinent History: -Dr. Richie 09/2020 for neuropsychological testing which revealed mild cognitive impairment with risk for Parkinson's disease dementia -orthostasis improved after decrease in amlodipine   -Midodrine  5mg  TID was not helpful. Florinef  0.1mg  resulted in labile blood pressures (low to >200 systolic).   Interval History Since Last Visit: Seth Carter  is a 83 y.o. right handed male who returns for follow up of Parkinson's Disease that was diagnosed in 2022.    He was last seen in clinic on 09/21/2023 by PA Struble. Plan then was increasing droxidopa  to 600mg  TID for [facility] and stopping donepezil  due to lack of benefit.   Since then, the patient presetned three times to the local ED on 3/8 and 3/14 and 3/17 for falls. He was last sent to SNF where he remains currently. His daughter communicates that his discharge plan is to go to memory care but he remains on the wait list (they are hoping within the next month per facility); back-up plan is returning to independent living with 24 hour caregiver services (privately paid).   He is here with his daughter and wife, who has advanced AD and does not contribute to conversation. The patient's daughter provides history. She states her father has declined significantly. He is needing more and more support for eating. He is having more cognitive fluctuations with visual hallucinations that are new since last visit. VH are not bothersome, scary, nor causing distress nor flight. He often needs help with toileting and grooming. He is sturdily 24 hour care in her view.   Pertinent Medications are as below:    8am 2pm 8pm  carbidopa /levodopa  25/100 2 2 2   Droxidopa  300 tablets 2 2 2     Patient Active Problem List   Diagnosis Date Noted  . Parkinson's disease (CMS/HHS-HCC) 11/29/2021  . Neurogenic orthostatic hypotension (CMS/HHS-HCC) 11/29/2021     No past medical history on file.  No past surgical history on file.  No family history on file.  Social History   Socioeconomic History  . Marital status: Married  Tobacco Use  . Smoking status: Never  . Smokeless tobacco: Never  Substance and Sexual Activity  . Alcohol  use: Not Currently     Alcohol /week: 12.0 - 14.0 standard drinks of alcohol     Types: 12 - 14 Standard drinks or equivalent per week  . Drug use: Never   Social Drivers of Health   Food Insecurity: No Food Insecurity (12/30/2022)   Received from [facility]   Hunger Vital Sign   . Worried About Programme researcher, broadcasting/film/video in the Last Year: Never true   . Ran Out of Food in the Last Year: Never true  Transportation Needs: No Transportation Needs (12/30/2022)   Received from [facility] - Transportation   . Lack of Transportation (Medical): No   . Lack of Transportation (Non-Medical): No  Housing Stability: Unknown (09/16/2023)   Housing Stability Vital Sign   . Homeless in the Last Year: No     Current Outpatient Medications:  .  aspirin  81 MG EC tablet, Take 81 mg by mouth once daily, Disp: , Rfl:  .  BESIVANCE 0.6 %, Place 1 drop into the left eye 3 (three) times daily For 2 days post eye injection, Disp: , Rfl:  .  carbidopa -levodopa  (SINEMET ) 25-100 mg tablet, Take 2 tablets by mouth 3 (three) times daily, Disp: 540 tablet, Rfl: 3 .  cetirizine (ZYRTEC) 10 MG tablet, Take 1 tablet by mouth once daily, Disp: , Rfl:  .  cholecalciferol (VITAMIN D3) 1000 unit tablet, Take 1 capsule by mouth once daily, Disp: , Rfl:  .  cyanocobalamin  (VITAMIN B12) 1000 MCG tablet, Take 1 tablet by mouth once daily, Disp: , Rfl:  .  dextromethorphan-guaiFENesin (ROBITUSSIN-DM) 10-100 mg/5 mL liquid, Take by mouth every 6 (six) hours as needed for Cough, Disp: , Rfl:  .  docusate (COLACE) 100 MG capsule, Take 1 capsule by mouth once daily as needed, Disp: , Rfl:  .  dorzolamide -timoloL  (COSOPT ) 22.3-6.8 mg/mL ophthalmic solution, Place 1 drop into both eyes 2 (two) times daily, Disp: , Rfl:  .  droxidopa  (NORTHERA ) 300 mg capsule, Take 2 capsules (600 mg total) by mouth 3 (three) times daily with meals, Disp: 540 capsule, Rfl: 3 .  latanoprost  (XALATAN ) 0.005 % ophthalmic solution, Place 1 drop into both eyes nightly, Disp: ,  Rfl:  .  magnesium oxide-Mg AA chelate (MAGNESIUM, OXIDE/AA CHELATE,) 300 mg Cap, Take 1 capsule by mouth every evening, Disp: , Rfl:  .  midodrine  (PROAMATINE ) 2.5 MG tablet, Take 1 tablet (2.5 mg total) by mouth 3 (three) times daily, Disp: 90 tablet, Rfl: 11 .  mirabegron  (MYRBETRIQ ) 50 mg ER tablet, Take 50 mg by mouth once daily, Disp: , Rfl:  .  multivitamin with minerals tablet, Take 1 tablet by mouth once daily, Disp: , Rfl:  .  naproxen (NAPROSYN) 250 MG tablet, Take 250 mg by mouth 2 (two) times daily with meals, Disp: , Rfl:  .  rosuvastatin  (CRESTOR ) 10 MG tablet, Take 10 mg by mouth once daily, Disp: , Rfl:  .  sertraline  (ZOLOFT ) 25 MG tablet, Take 25 mg by mouth once daily, Disp: , Rfl:  .  TRIAMCINOLONE  ACETONIDE NASAL, Place into one nostril, Disp: , Rfl:  .  vit C-E-cupric-zinc-lutein (PRESERVISION LUTEIN) 226 mg-200 unit -0.8 mg-5 mg, Take 1 capsule by mouth once daily, Disp: ,  Rfl:  .  donepeziL  (ARICEPT ) 10 MG tablet, Take 1 tablet (10 mg total) by mouth at bedtime (Patient not taking: Reported on 11/12/2023), Disp: 90 tablet, Rfl: 3 .  fludrocortisone  (FLORINEF ) 0.1 mg tablet, Take 1 tablet (0.1 mg total) by mouth once daily, Disp: 90 tablet, Rfl: 3 .  menthol-herbal drugs (RICOLA) Lozg, by Mucous Membrane route (Patient not taking: Reported on 11/12/2023), Disp: , Rfl:  .  prednisoLONE  acetate (PRED FORTE ) 1 % ophthalmic suspension, Apply 1 drop to eye 4 (four) times daily (Patient not taking: Reported on 11/12/2023), Disp: , Rfl:   Allergies: Jari is allergic to milk containing products (dairy), simvastatin, ace inhibitors, iodine, and pneumococcal 23-val ps vaccine.  Physical Exam:  Vital Signs:  Vitals:   11/12/23 0814 11/12/23 0818  BP: 112/72 91/56  BP Location: Right upper arm Right upper arm  Patient Position: Sitting Standing  BP Cuff Size: Adult Adult  Pulse: 61 67  Weight: 81 kg (178 lb 9.6 oz)   Height: 180.3 cm (5' 11)    Gen: NAD.  HEENT: Atraumatic.   CV: No chest clenching.  Resp: No labored breathing.  Abd: Non-distended.  Extr: No appreciable edema.  MSK: No gross joint deformity on inspection. Skin: No obvious rash.  Psych: Quiet in chair. No RTIS.   Neurologic exam:   MENTAL STATUS: Awake and maintaining wakefulness spontaneously. Follows 1 step commands. Names correctly fingers, hands, elbows. Fluent speech when speaking.                                     CN II-XII:   No ptosis.  EOMs intact on observation without nystagmus.  Light-touch normal on face bilaterally.  Face symmetric on observation.  Hearing within normal limits to conversation.  No dysarthria.  MOTOR:  Normal bulk.  There was no pronator drift. Easily and evenly anti-gravity without drift in all limbs.   SENSATION: Diffusely and evenly intact to light touch.   Movement Examination  Eye movements as above.  Volume of speech is soft. Facial expression is reduced.  No resting tremors in the head, voice, jaw, arms nor legs.  Global moderate bradykinesia.  Tone with moderate rigidity in the arms and legs.  No observed truncal ataxia. Walker only for stance. 2-person assist to stand.   Assessment:    ICD-10-CM  1. Parkinson's disease without dyskinesia or fluctuating manifestations (CMS/HHS-HCC)  G20.A1  2. Dementia in Parkinson's disease (CMS/HHS-HCC)  G20.A1   F02.80  3. 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 Neurogenic orthostatic hypotension (CMS/HHS-HCC)  G90.3    Seth Carter is a 83 y.o. male with a history of bradykinesia and cognitive slowing since 2020, diagnosed at outside neurology office (Dr. Evonnie) with PD in 2021, who presents for follow-up of Parkinson's disease, dementia, psychosis, orthostatic hypotension, and recurrent falls.  Parkinson's disease (PD) Continue CDLD unchanged   Orthostatic hypotension Droxidopa  maxed out at 600mg  TID.  Adding back fludrocortisone  0.1mg  daily.   PD with dementia   Pending placement to Memory Care Unit.  Note: wife has severe AD.   PD with psychosis  Visual hallucinations developed since last visit being off AChE-I. Currently not bothersome nor creating safety/fall risks.  Deferred antipsychotics at this time.    This case was seen and staffed with attending neurologist, Dr. Pablo.   Seth Soulier, Seth Carter Fellow in Movement Disorders, PGY5  Plan: Medications Ordered: Requested Prescriptions   Signed Prescriptions Disp Refills  . fludrocortisone  (FLORINEF ) 0.1 mg tablet 90 tablet 3    Sig: Take 1 tablet (0.1 mg total) by mouth once daily     Orders Placed: No orders of the defined types were placed in this encounter.    Patient Instructions  Thank you so much for coming to clinic today!   We recommend re-starting Fludrocortisone  0.1mg  daily (with breakfast or with the first dose of the day with his other medications) to help maintain his blood pressure and preventing it from dropping low leading to falls and passing out.   We are glad you are slated to transition to a Memory Care Unit. This level of care is appropriate to your level of needs.   We talked about when medications are appropriate for treating hallucinations: when they become scary, when they become bothersome, or when they cause him to flee a scene/room/home.   -------  Follow Up: Return in about 4 months (around 03/13/2024) for return PA Struble in 4 and 8 months; 12 months with Dr. pablo. SABRA Ellison Statement:   I personally saw and evaluated the patient, and participated in the management and treatment plan as documented in the resident/fellow note.  Seth NEOMI Seth Carter, Seth Carter

## 2023-11-15 DIAGNOSIS — Z515 Encounter for palliative care: Secondary | ICD-10-CM | POA: Diagnosis not present

## 2023-11-19 ENCOUNTER — Other Ambulatory Visit: Payer: Self-pay | Admitting: *Deleted

## 2023-11-19 NOTE — Patient Outreach (Signed)
 Post-Acute Care Manager follow up. Mr. Poore utilized [facility] SNF waiver for admission to [facility] SNF. Verified in [facility] Mr. Ganey remains in SNF.   Screening for potential complex care management services as benefit of health plan and primary care provider.  Collaboration with Karel Osler SNF social worker. Mr. Grisso will return to [facility] IL[facility] next Monday with [facility].   Nolberto Batty, MSN, RN, BSN [place]  [facility], [facility] Commun[facility] RN Post- Acute Care Manager Direct Dial: [phone]

## 2023-11-22 DIAGNOSIS — R600 Localized edema: Secondary | ICD-10-CM | POA: Diagnosis not present

## 2023-11-22 DIAGNOSIS — G9340 Encephalopathy, unspecified: Secondary | ICD-10-CM | POA: Diagnosis not present

## 2023-11-22 DIAGNOSIS — Z515 Encounter for palliative care: Secondary | ICD-10-CM | POA: Diagnosis not present

## 2023-11-22 DIAGNOSIS — E785 Hyperlipidemia, unspecified: Secondary | ICD-10-CM | POA: Diagnosis not present

## 2023-11-26 DIAGNOSIS — I10 Essential (primary) hypertension: Secondary | ICD-10-CM | POA: Diagnosis not present

## 2023-11-27 DIAGNOSIS — E785 Hyperlipidemia, unspecified: Secondary | ICD-10-CM | POA: Diagnosis not present

## 2023-11-27 DIAGNOSIS — I10 Essential (primary) hypertension: Secondary | ICD-10-CM | POA: Diagnosis not present

## 2023-11-27 DIAGNOSIS — H3530 Unspecified macular degeneration: Secondary | ICD-10-CM | POA: Diagnosis not present

## 2023-11-27 DIAGNOSIS — I951 Orthostatic hypotension: Secondary | ICD-10-CM | POA: Diagnosis not present

## 2023-11-27 DIAGNOSIS — J302 Other seasonal allergic rhinitis: Secondary | ICD-10-CM | POA: Diagnosis not present

## 2023-11-27 DIAGNOSIS — I679 Cerebrovascular disease, unspecified: Secondary | ICD-10-CM | POA: Diagnosis not present

## 2023-11-27 DIAGNOSIS — I509 Heart failure, unspecified: Secondary | ICD-10-CM | POA: Diagnosis not present

## 2023-11-27 DIAGNOSIS — G4089 Other seizures: Secondary | ICD-10-CM | POA: Diagnosis not present

## 2023-11-27 DIAGNOSIS — H409 Unspecified glaucoma: Secondary | ICD-10-CM | POA: Diagnosis not present

## 2023-11-27 DIAGNOSIS — G20C Parkinsonism, unspecified: Secondary | ICD-10-CM | POA: Diagnosis not present

## 2023-11-27 DIAGNOSIS — F32A Depression, unspecified: Secondary | ICD-10-CM | POA: Diagnosis not present

## 2023-11-27 DIAGNOSIS — G3183 Dementia with Lewy bodies: Secondary | ICD-10-CM | POA: Diagnosis not present

## 2023-11-28 DIAGNOSIS — G20C Parkinsonism, unspecified: Secondary | ICD-10-CM | POA: Diagnosis not present

## 2023-11-28 DIAGNOSIS — I679 Cerebrovascular disease, unspecified: Secondary | ICD-10-CM | POA: Diagnosis not present

## 2023-11-28 DIAGNOSIS — F32A Depression, unspecified: Secondary | ICD-10-CM | POA: Diagnosis not present

## 2023-11-28 DIAGNOSIS — M6250 Muscle wasting and atrophy, not elsewhere classified, unspecified site: Secondary | ICD-10-CM | POA: Diagnosis not present

## 2023-11-28 DIAGNOSIS — E785 Hyperlipidemia, unspecified: Secondary | ICD-10-CM | POA: Diagnosis not present

## 2023-11-28 DIAGNOSIS — G4089 Other seizures: Secondary | ICD-10-CM | POA: Diagnosis not present

## 2023-11-28 DIAGNOSIS — R2689 Other abnormalities of gait and mobility: Secondary | ICD-10-CM | POA: Diagnosis not present

## 2023-11-28 DIAGNOSIS — I10 Essential (primary) hypertension: Secondary | ICD-10-CM | POA: Diagnosis not present

## 2023-11-29 DIAGNOSIS — I10 Essential (primary) hypertension: Secondary | ICD-10-CM | POA: Diagnosis not present

## 2023-11-29 DIAGNOSIS — G4089 Other seizures: Secondary | ICD-10-CM | POA: Diagnosis not present

## 2023-11-29 DIAGNOSIS — F32A Depression, unspecified: Secondary | ICD-10-CM | POA: Diagnosis not present

## 2023-11-29 DIAGNOSIS — E785 Hyperlipidemia, unspecified: Secondary | ICD-10-CM | POA: Diagnosis not present

## 2023-11-29 DIAGNOSIS — Z515 Encounter for palliative care: Secondary | ICD-10-CM | POA: Diagnosis not present

## 2023-11-29 DIAGNOSIS — G20C Parkinsonism, unspecified: Secondary | ICD-10-CM | POA: Diagnosis not present

## 2023-11-29 DIAGNOSIS — I951 Orthostatic hypotension: Secondary | ICD-10-CM | POA: Diagnosis not present

## 2023-11-29 DIAGNOSIS — G3183 Dementia with Lewy bodies: Secondary | ICD-10-CM | POA: Diagnosis not present

## 2023-11-29 DIAGNOSIS — I679 Cerebrovascular disease, unspecified: Secondary | ICD-10-CM | POA: Diagnosis not present

## 2023-11-29 DIAGNOSIS — G20A1 Parkinson's disease without dyskinesia, without mention of fluctuations: Secondary | ICD-10-CM | POA: Diagnosis not present

## 2023-11-29 DIAGNOSIS — Z9181 History of falling: Secondary | ICD-10-CM | POA: Diagnosis not present

## 2023-12-02 DIAGNOSIS — G20C Parkinsonism, unspecified: Secondary | ICD-10-CM | POA: Diagnosis not present

## 2023-12-02 DIAGNOSIS — E785 Hyperlipidemia, unspecified: Secondary | ICD-10-CM | POA: Diagnosis not present

## 2023-12-02 DIAGNOSIS — F32A Depression, unspecified: Secondary | ICD-10-CM | POA: Diagnosis not present

## 2023-12-02 DIAGNOSIS — I10 Essential (primary) hypertension: Secondary | ICD-10-CM | POA: Diagnosis not present

## 2023-12-02 DIAGNOSIS — I679 Cerebrovascular disease, unspecified: Secondary | ICD-10-CM | POA: Diagnosis not present

## 2023-12-02 DIAGNOSIS — G4089 Other seizures: Secondary | ICD-10-CM | POA: Diagnosis not present

## 2023-12-04 DIAGNOSIS — I679 Cerebrovascular disease, unspecified: Secondary | ICD-10-CM | POA: Diagnosis not present

## 2023-12-04 DIAGNOSIS — G20C Parkinsonism, unspecified: Secondary | ICD-10-CM | POA: Diagnosis not present

## 2023-12-04 DIAGNOSIS — F32A Depression, unspecified: Secondary | ICD-10-CM | POA: Diagnosis not present

## 2023-12-04 DIAGNOSIS — I10 Essential (primary) hypertension: Secondary | ICD-10-CM | POA: Diagnosis not present

## 2023-12-04 DIAGNOSIS — G4089 Other seizures: Secondary | ICD-10-CM | POA: Diagnosis not present

## 2023-12-04 DIAGNOSIS — E785 Hyperlipidemia, unspecified: Secondary | ICD-10-CM | POA: Diagnosis not present

## 2023-12-06 DIAGNOSIS — G4089 Other seizures: Secondary | ICD-10-CM | POA: Diagnosis not present

## 2023-12-06 DIAGNOSIS — I10 Essential (primary) hypertension: Secondary | ICD-10-CM | POA: Diagnosis not present

## 2023-12-06 DIAGNOSIS — E785 Hyperlipidemia, unspecified: Secondary | ICD-10-CM | POA: Diagnosis not present

## 2023-12-06 DIAGNOSIS — I679 Cerebrovascular disease, unspecified: Secondary | ICD-10-CM | POA: Diagnosis not present

## 2023-12-06 DIAGNOSIS — F32A Depression, unspecified: Secondary | ICD-10-CM | POA: Diagnosis not present

## 2023-12-06 DIAGNOSIS — G20C Parkinsonism, unspecified: Secondary | ICD-10-CM | POA: Diagnosis not present

## 2023-12-07 DIAGNOSIS — F32A Depression, unspecified: Secondary | ICD-10-CM | POA: Diagnosis not present

## 2023-12-07 DIAGNOSIS — I679 Cerebrovascular disease, unspecified: Secondary | ICD-10-CM | POA: Diagnosis not present

## 2023-12-07 DIAGNOSIS — G20C Parkinsonism, unspecified: Secondary | ICD-10-CM | POA: Diagnosis not present

## 2023-12-07 DIAGNOSIS — G4089 Other seizures: Secondary | ICD-10-CM | POA: Diagnosis not present

## 2023-12-07 DIAGNOSIS — I10 Essential (primary) hypertension: Secondary | ICD-10-CM | POA: Diagnosis not present

## 2023-12-07 DIAGNOSIS — E785 Hyperlipidemia, unspecified: Secondary | ICD-10-CM | POA: Diagnosis not present

## 2023-12-08 DIAGNOSIS — I10 Essential (primary) hypertension: Secondary | ICD-10-CM | POA: Diagnosis not present

## 2023-12-08 DIAGNOSIS — E785 Hyperlipidemia, unspecified: Secondary | ICD-10-CM | POA: Diagnosis not present

## 2023-12-08 DIAGNOSIS — F32A Depression, unspecified: Secondary | ICD-10-CM | POA: Diagnosis not present

## 2023-12-08 DIAGNOSIS — G20C Parkinsonism, unspecified: Secondary | ICD-10-CM | POA: Diagnosis not present

## 2023-12-08 DIAGNOSIS — I679 Cerebrovascular disease, unspecified: Secondary | ICD-10-CM | POA: Diagnosis not present

## 2023-12-08 DIAGNOSIS — G4089 Other seizures: Secondary | ICD-10-CM | POA: Diagnosis not present

## 2023-12-10 DIAGNOSIS — F331 Major depressive disorder, recurrent, moderate: Secondary | ICD-10-CM | POA: Diagnosis not present

## 2023-12-10 DIAGNOSIS — E785 Hyperlipidemia, unspecified: Secondary | ICD-10-CM | POA: Diagnosis not present

## 2023-12-10 DIAGNOSIS — I679 Cerebrovascular disease, unspecified: Secondary | ICD-10-CM | POA: Diagnosis not present

## 2023-12-10 DIAGNOSIS — F32A Depression, unspecified: Secondary | ICD-10-CM | POA: Diagnosis not present

## 2023-12-10 DIAGNOSIS — I10 Essential (primary) hypertension: Secondary | ICD-10-CM | POA: Diagnosis not present

## 2023-12-10 DIAGNOSIS — Z515 Encounter for palliative care: Secondary | ICD-10-CM | POA: Diagnosis not present

## 2023-12-10 DIAGNOSIS — G4089 Other seizures: Secondary | ICD-10-CM | POA: Diagnosis not present

## 2023-12-10 DIAGNOSIS — Z9181 History of falling: Secondary | ICD-10-CM | POA: Diagnosis not present

## 2023-12-10 DIAGNOSIS — F5105 Insomnia due to other mental disorder: Secondary | ICD-10-CM | POA: Diagnosis not present

## 2023-12-10 DIAGNOSIS — F411 Generalized anxiety disorder: Secondary | ICD-10-CM | POA: Diagnosis not present

## 2023-12-10 DIAGNOSIS — G20C Parkinsonism, unspecified: Secondary | ICD-10-CM | POA: Diagnosis not present

## 2023-12-10 DIAGNOSIS — G20A1 Parkinson's disease without dyskinesia, without mention of fluctuations: Secondary | ICD-10-CM | POA: Diagnosis not present

## 2023-12-10 DIAGNOSIS — G3183 Dementia with Lewy bodies: Secondary | ICD-10-CM | POA: Diagnosis not present

## 2023-12-11 DIAGNOSIS — I10 Essential (primary) hypertension: Secondary | ICD-10-CM | POA: Diagnosis not present

## 2023-12-11 DIAGNOSIS — I679 Cerebrovascular disease, unspecified: Secondary | ICD-10-CM | POA: Diagnosis not present

## 2023-12-11 DIAGNOSIS — E785 Hyperlipidemia, unspecified: Secondary | ICD-10-CM | POA: Diagnosis not present

## 2023-12-11 DIAGNOSIS — G4089 Other seizures: Secondary | ICD-10-CM | POA: Diagnosis not present

## 2023-12-11 DIAGNOSIS — F32A Depression, unspecified: Secondary | ICD-10-CM | POA: Diagnosis not present

## 2023-12-11 DIAGNOSIS — G20C Parkinsonism, unspecified: Secondary | ICD-10-CM | POA: Diagnosis not present

## 2023-12-12 DIAGNOSIS — G4089 Other seizures: Secondary | ICD-10-CM | POA: Diagnosis not present

## 2023-12-12 DIAGNOSIS — E785 Hyperlipidemia, unspecified: Secondary | ICD-10-CM | POA: Diagnosis not present

## 2023-12-12 DIAGNOSIS — I679 Cerebrovascular disease, unspecified: Secondary | ICD-10-CM | POA: Diagnosis not present

## 2023-12-12 DIAGNOSIS — G20C Parkinsonism, unspecified: Secondary | ICD-10-CM | POA: Diagnosis not present

## 2023-12-12 DIAGNOSIS — I10 Essential (primary) hypertension: Secondary | ICD-10-CM | POA: Diagnosis not present

## 2023-12-12 DIAGNOSIS — F32A Depression, unspecified: Secondary | ICD-10-CM | POA: Diagnosis not present

## 2023-12-14 DIAGNOSIS — I10 Essential (primary) hypertension: Secondary | ICD-10-CM | POA: Diagnosis not present

## 2023-12-14 DIAGNOSIS — G4089 Other seizures: Secondary | ICD-10-CM | POA: Diagnosis not present

## 2023-12-14 DIAGNOSIS — E785 Hyperlipidemia, unspecified: Secondary | ICD-10-CM | POA: Diagnosis not present

## 2023-12-14 DIAGNOSIS — I679 Cerebrovascular disease, unspecified: Secondary | ICD-10-CM | POA: Diagnosis not present

## 2023-12-14 DIAGNOSIS — F32A Depression, unspecified: Secondary | ICD-10-CM | POA: Diagnosis not present

## 2023-12-14 DIAGNOSIS — G20C Parkinsonism, unspecified: Secondary | ICD-10-CM | POA: Diagnosis not present

## 2023-12-17 DIAGNOSIS — E785 Hyperlipidemia, unspecified: Secondary | ICD-10-CM | POA: Diagnosis not present

## 2023-12-17 DIAGNOSIS — G4089 Other seizures: Secondary | ICD-10-CM | POA: Diagnosis not present

## 2023-12-17 DIAGNOSIS — I10 Essential (primary) hypertension: Secondary | ICD-10-CM | POA: Diagnosis not present

## 2023-12-17 DIAGNOSIS — F32A Depression, unspecified: Secondary | ICD-10-CM | POA: Diagnosis not present

## 2023-12-17 DIAGNOSIS — I679 Cerebrovascular disease, unspecified: Secondary | ICD-10-CM | POA: Diagnosis not present

## 2023-12-17 DIAGNOSIS — G20C Parkinsonism, unspecified: Secondary | ICD-10-CM | POA: Diagnosis not present

## 2023-12-18 DIAGNOSIS — I10 Essential (primary) hypertension: Secondary | ICD-10-CM | POA: Diagnosis not present

## 2023-12-18 DIAGNOSIS — E785 Hyperlipidemia, unspecified: Secondary | ICD-10-CM | POA: Diagnosis not present

## 2023-12-18 DIAGNOSIS — G4089 Other seizures: Secondary | ICD-10-CM | POA: Diagnosis not present

## 2023-12-18 DIAGNOSIS — I679 Cerebrovascular disease, unspecified: Secondary | ICD-10-CM | POA: Diagnosis not present

## 2023-12-18 DIAGNOSIS — F32A Depression, unspecified: Secondary | ICD-10-CM | POA: Diagnosis not present

## 2023-12-18 DIAGNOSIS — G20C Parkinsonism, unspecified: Secondary | ICD-10-CM | POA: Diagnosis not present

## 2023-12-19 DIAGNOSIS — R4182 Altered mental status, unspecified: Secondary | ICD-10-CM | POA: Diagnosis not present

## 2023-12-20 DIAGNOSIS — I10 Essential (primary) hypertension: Secondary | ICD-10-CM | POA: Diagnosis not present

## 2023-12-20 DIAGNOSIS — E785 Hyperlipidemia, unspecified: Secondary | ICD-10-CM | POA: Diagnosis not present

## 2023-12-20 DIAGNOSIS — G20C Parkinsonism, unspecified: Secondary | ICD-10-CM | POA: Diagnosis not present

## 2023-12-20 DIAGNOSIS — F32A Depression, unspecified: Secondary | ICD-10-CM | POA: Diagnosis not present

## 2023-12-20 DIAGNOSIS — G4089 Other seizures: Secondary | ICD-10-CM | POA: Diagnosis not present

## 2023-12-20 DIAGNOSIS — I679 Cerebrovascular disease, unspecified: Secondary | ICD-10-CM | POA: Diagnosis not present

## 2023-12-21 DIAGNOSIS — F32A Depression, unspecified: Secondary | ICD-10-CM | POA: Diagnosis not present

## 2023-12-21 DIAGNOSIS — G4089 Other seizures: Secondary | ICD-10-CM | POA: Diagnosis not present

## 2023-12-21 DIAGNOSIS — I10 Essential (primary) hypertension: Secondary | ICD-10-CM | POA: Diagnosis not present

## 2023-12-21 DIAGNOSIS — I679 Cerebrovascular disease, unspecified: Secondary | ICD-10-CM | POA: Diagnosis not present

## 2023-12-21 DIAGNOSIS — E785 Hyperlipidemia, unspecified: Secondary | ICD-10-CM | POA: Diagnosis not present

## 2023-12-21 DIAGNOSIS — G20C Parkinsonism, unspecified: Secondary | ICD-10-CM | POA: Diagnosis not present

## 2023-12-23 DIAGNOSIS — E785 Hyperlipidemia, unspecified: Secondary | ICD-10-CM | POA: Diagnosis not present

## 2023-12-23 DIAGNOSIS — F32A Depression, unspecified: Secondary | ICD-10-CM | POA: Diagnosis not present

## 2023-12-23 DIAGNOSIS — I679 Cerebrovascular disease, unspecified: Secondary | ICD-10-CM | POA: Diagnosis not present

## 2023-12-23 DIAGNOSIS — I10 Essential (primary) hypertension: Secondary | ICD-10-CM | POA: Diagnosis not present

## 2023-12-23 DIAGNOSIS — G3183 Dementia with Lewy bodies: Secondary | ICD-10-CM | POA: Diagnosis not present

## 2023-12-23 DIAGNOSIS — H3530 Unspecified macular degeneration: Secondary | ICD-10-CM | POA: Diagnosis not present

## 2023-12-23 DIAGNOSIS — H409 Unspecified glaucoma: Secondary | ICD-10-CM | POA: Diagnosis not present

## 2023-12-23 DIAGNOSIS — I509 Heart failure, unspecified: Secondary | ICD-10-CM | POA: Diagnosis not present

## 2023-12-23 DIAGNOSIS — G20C Parkinsonism, unspecified: Secondary | ICD-10-CM | POA: Diagnosis not present

## 2023-12-23 DIAGNOSIS — I951 Orthostatic hypotension: Secondary | ICD-10-CM | POA: Diagnosis not present

## 2023-12-23 DIAGNOSIS — G4089 Other seizures: Secondary | ICD-10-CM | POA: Diagnosis not present

## 2023-12-23 DIAGNOSIS — J302 Other seasonal allergic rhinitis: Secondary | ICD-10-CM | POA: Diagnosis not present

## 2023-12-24 DIAGNOSIS — Z515 Encounter for palliative care: Secondary | ICD-10-CM | POA: Diagnosis not present

## 2023-12-24 DIAGNOSIS — F411 Generalized anxiety disorder: Secondary | ICD-10-CM | POA: Diagnosis not present

## 2023-12-24 DIAGNOSIS — G3183 Dementia with Lewy bodies: Secondary | ICD-10-CM | POA: Diagnosis not present

## 2023-12-24 DIAGNOSIS — F5105 Insomnia due to other mental disorder: Secondary | ICD-10-CM | POA: Diagnosis not present

## 2023-12-24 DIAGNOSIS — F331 Major depressive disorder, recurrent, moderate: Secondary | ICD-10-CM | POA: Diagnosis not present

## 2023-12-24 DIAGNOSIS — G20A1 Parkinson's disease without dyskinesia, without mention of fluctuations: Secondary | ICD-10-CM | POA: Diagnosis not present

## 2023-12-25 DIAGNOSIS — I679 Cerebrovascular disease, unspecified: Secondary | ICD-10-CM | POA: Diagnosis not present

## 2023-12-25 DIAGNOSIS — I10 Essential (primary) hypertension: Secondary | ICD-10-CM | POA: Diagnosis not present

## 2023-12-25 DIAGNOSIS — G4089 Other seizures: Secondary | ICD-10-CM | POA: Diagnosis not present

## 2023-12-25 DIAGNOSIS — E785 Hyperlipidemia, unspecified: Secondary | ICD-10-CM | POA: Diagnosis not present

## 2023-12-25 DIAGNOSIS — F32A Depression, unspecified: Secondary | ICD-10-CM | POA: Diagnosis not present

## 2023-12-25 DIAGNOSIS — G20C Parkinsonism, unspecified: Secondary | ICD-10-CM | POA: Diagnosis not present

## 2023-12-26 DIAGNOSIS — I10 Essential (primary) hypertension: Secondary | ICD-10-CM | POA: Diagnosis not present

## 2023-12-26 DIAGNOSIS — G20C Parkinsonism, unspecified: Secondary | ICD-10-CM | POA: Diagnosis not present

## 2023-12-26 DIAGNOSIS — I679 Cerebrovascular disease, unspecified: Secondary | ICD-10-CM | POA: Diagnosis not present

## 2023-12-26 DIAGNOSIS — G4089 Other seizures: Secondary | ICD-10-CM | POA: Diagnosis not present

## 2023-12-26 DIAGNOSIS — F32A Depression, unspecified: Secondary | ICD-10-CM | POA: Diagnosis not present

## 2023-12-26 DIAGNOSIS — E785 Hyperlipidemia, unspecified: Secondary | ICD-10-CM | POA: Diagnosis not present

## 2023-12-27 DIAGNOSIS — I679 Cerebrovascular disease, unspecified: Secondary | ICD-10-CM | POA: Diagnosis not present

## 2023-12-27 DIAGNOSIS — F32A Depression, unspecified: Secondary | ICD-10-CM | POA: Diagnosis not present

## 2023-12-27 DIAGNOSIS — I10 Essential (primary) hypertension: Secondary | ICD-10-CM | POA: Diagnosis not present

## 2023-12-27 DIAGNOSIS — G4089 Other seizures: Secondary | ICD-10-CM | POA: Diagnosis not present

## 2023-12-27 DIAGNOSIS — E785 Hyperlipidemia, unspecified: Secondary | ICD-10-CM | POA: Diagnosis not present

## 2023-12-27 DIAGNOSIS — G20C Parkinsonism, unspecified: Secondary | ICD-10-CM | POA: Diagnosis not present

## 2023-12-28 DIAGNOSIS — I10 Essential (primary) hypertension: Secondary | ICD-10-CM | POA: Diagnosis not present

## 2023-12-28 DIAGNOSIS — F32A Depression, unspecified: Secondary | ICD-10-CM | POA: Diagnosis not present

## 2023-12-28 DIAGNOSIS — I679 Cerebrovascular disease, unspecified: Secondary | ICD-10-CM | POA: Diagnosis not present

## 2023-12-28 DIAGNOSIS — G4089 Other seizures: Secondary | ICD-10-CM | POA: Diagnosis not present

## 2023-12-28 DIAGNOSIS — G20C Parkinsonism, unspecified: Secondary | ICD-10-CM | POA: Diagnosis not present

## 2023-12-28 DIAGNOSIS — E785 Hyperlipidemia, unspecified: Secondary | ICD-10-CM | POA: Diagnosis not present

## 2023-12-29 DIAGNOSIS — G20C Parkinsonism, unspecified: Secondary | ICD-10-CM | POA: Diagnosis not present

## 2023-12-29 DIAGNOSIS — I10 Essential (primary) hypertension: Secondary | ICD-10-CM | POA: Diagnosis not present

## 2023-12-29 DIAGNOSIS — G4089 Other seizures: Secondary | ICD-10-CM | POA: Diagnosis not present

## 2023-12-29 DIAGNOSIS — E785 Hyperlipidemia, unspecified: Secondary | ICD-10-CM | POA: Diagnosis not present

## 2023-12-29 DIAGNOSIS — F32A Depression, unspecified: Secondary | ICD-10-CM | POA: Diagnosis not present

## 2023-12-29 DIAGNOSIS — I679 Cerebrovascular disease, unspecified: Secondary | ICD-10-CM | POA: Diagnosis not present

## 2023-12-30 DIAGNOSIS — G4089 Other seizures: Secondary | ICD-10-CM | POA: Diagnosis not present

## 2023-12-30 DIAGNOSIS — I679 Cerebrovascular disease, unspecified: Secondary | ICD-10-CM | POA: Diagnosis not present

## 2023-12-30 DIAGNOSIS — G20C Parkinsonism, unspecified: Secondary | ICD-10-CM | POA: Diagnosis not present

## 2023-12-30 DIAGNOSIS — E785 Hyperlipidemia, unspecified: Secondary | ICD-10-CM | POA: Diagnosis not present

## 2023-12-30 DIAGNOSIS — F32A Depression, unspecified: Secondary | ICD-10-CM | POA: Diagnosis not present

## 2023-12-30 DIAGNOSIS — I10 Essential (primary) hypertension: Secondary | ICD-10-CM | POA: Diagnosis not present

## 2023-12-31 ENCOUNTER — Encounter (HOSPITAL_COMMUNITY): Payer: Self-pay

## 2023-12-31 ENCOUNTER — Emergency Department (HOSPITAL_COMMUNITY)
Admission: EM | Admit: 2023-12-31 | Discharge: 2023-12-31 | Disposition: A | Discharge status: Hospice | Attending: Emergency Medicine | Admitting: Emergency Medicine

## 2023-12-31 ENCOUNTER — Other Ambulatory Visit: Payer: Self-pay

## 2023-12-31 ENCOUNTER — Emergency Department (HOSPITAL_COMMUNITY)

## 2023-12-31 DIAGNOSIS — S01112A Laceration without foreign body of left eyelid and periocular area, initial encounter: Secondary | ICD-10-CM | POA: Diagnosis not present

## 2023-12-31 DIAGNOSIS — M25519 Pain in unspecified shoulder: Secondary | ICD-10-CM | POA: Diagnosis not present

## 2023-12-31 DIAGNOSIS — G20C Parkinsonism, unspecified: Secondary | ICD-10-CM | POA: Insufficient documentation

## 2023-12-31 DIAGNOSIS — G4089 Other seizures: Secondary | ICD-10-CM | POA: Diagnosis not present

## 2023-12-31 DIAGNOSIS — S0990XA Unspecified injury of head, initial encounter: Secondary | ICD-10-CM

## 2023-12-31 DIAGNOSIS — R55 Syncope and collapse: Secondary | ICD-10-CM | POA: Diagnosis not present

## 2023-12-31 DIAGNOSIS — F0280 Dementia in other diseases classified elsewhere without behavioral disturbance: Secondary | ICD-10-CM | POA: Diagnosis not present

## 2023-12-31 DIAGNOSIS — Z7982 Long term (current) use of aspirin: Secondary | ICD-10-CM | POA: Diagnosis not present

## 2023-12-31 DIAGNOSIS — I10 Essential (primary) hypertension: Secondary | ICD-10-CM | POA: Diagnosis not present

## 2023-12-31 DIAGNOSIS — F32A Depression, unspecified: Secondary | ICD-10-CM | POA: Diagnosis not present

## 2023-12-31 DIAGNOSIS — I679 Cerebrovascular disease, unspecified: Secondary | ICD-10-CM | POA: Diagnosis not present

## 2023-12-31 DIAGNOSIS — W19XXXA Unspecified fall, initial encounter: Secondary | ICD-10-CM | POA: Diagnosis not present

## 2023-12-31 DIAGNOSIS — S0993XA Unspecified injury of face, initial encounter: Secondary | ICD-10-CM | POA: Diagnosis present

## 2023-12-31 DIAGNOSIS — E785 Hyperlipidemia, unspecified: Secondary | ICD-10-CM | POA: Diagnosis not present

## 2023-12-31 DIAGNOSIS — R58 Hemorrhage, not elsewhere classified: Secondary | ICD-10-CM | POA: Diagnosis not present

## 2023-12-31 MED ORDER — LIDOCAINE HCL (PF) 1 % IJ SOLN
2.0000 mL | Freq: Once | INTRAMUSCULAR | Status: DC
  Filled 2023-12-31: qty 30

## 2023-12-31 NOTE — ED Triage Notes (Addendum)
 Patient BIB [facility] from [facility]. Has dementia and Parkinsons. Patient got up this morning with his walker and fell unwitnessed. Laceration above left eyebrow. No blood thinners. Patient is on hospice.

## 2023-12-31 NOTE — ED Provider Notes (Signed)
 [place] EMERGENCY DEPARTMENT AT [facility] Provider Note   CSN: [id number] Arrival date & time: 12/31/23  1119     History  Chief Complaint  Patient presents with   Seth Carter is a 83 y.o. male with Parkinson's and dementia who presents from [facility] after a unwitnessed mechanical fall resulting in left upper eyebrow laceration. Not on blood thinners. Denies LOC or HA.   The history is provided by the patient and a relative.  Fall       Home Medications Prior to Admission medications   Medication Sig Start Date End Date Taking? Authorizing Provider  aspirin  EC 81 MG tablet Take 81 mg by mouth at bedtime.    [provider]  carbidopa -levodopa  (SINEMET  IR) 25-100 MG tablet Take 2 tablets by mouth 3 (three) times daily.    [provider]  cetirizine (ZYRTEC) 10 MG tablet Take 10 mg by mouth daily.    [provider]  Cyanocobalamin  (VITAMIN B-12 PO) Take 1 tablet by mouth daily with lunch.    [provider]  dorzolamide -timolol  (COSOPT ) 22.3-6.8 MG/ML ophthalmic solution Place 1 drop into both eyes 2 (two) times daily.    [provider]  droxidopa  (NORTHERA ) 300 MG CAPS Take 300 mg by mouth 3 (three) times daily with meals. 09/17/23 09/16/24  [provider]  fludrocortisone  (FLORINEF ) 0.1 MG tablet Take 100 mcg by mouth daily. 11/12/23   [provider]  latanoprost  (XALATAN ) 0.005 % ophthalmic solution Place 1 drop into both eyes at bedtime.    [provider]  LORazepam (ATIVAN) 0.5 MG tablet Take 0.5 mg by mouth. 12/18/23   [provider]  MAGNESIUM OXIDE PO Take 1 tablet by mouth at bedtime.    [provider]  midodrine (PROAMATINE) 2.5 MG tablet Take 2.5 mg by mouth 3 (three) times daily. 11/05/23   [provider]  mirabegron  ER (MYRBETRIQ ) 50 MG TB24 tablet Take 50 mg by mouth daily.    [provider]  Multiple Vitamin  (MULTIVITAMIN WITH MINERALS) TABS tablet Take 1 tablet by mouth daily.    [provider]  Multiple Vitamins-Minerals (PRESERVISION/LUTEIN) CAPS Take 1 capsule by mouth daily with lunch.    [provider]  Polyethyl Glycol-Propyl Glycol 0.4-0.3 % SOLN Place 1 application  into both eyes daily.    [provider]  prednisoLONE  acetate (PRED FORTE ) 1 % ophthalmic suspension SMARTSIG:In Eye(s) 12/06/23   [provider]  Propylene Glycol (SYSTANE COMPLETE PF OP) Place 1 drop into both eyes 2 (two) times daily.    [provider]  QUEtiapine (SEROQUEL) 25 MG tablet Take 25 mg by mouth at bedtime. 12/24/23   [provider]  QUEtiapine (SEROQUEL) 50 MG tablet Take 50 mg by mouth at bedtime. 12/25/23   [provider]  rosuvastatin  (CRESTOR ) 10 MG tablet Take 10 mg by mouth at bedtime.    [provider]  sertraline  (ZOLOFT ) 25 MG tablet Take 25 mg by mouth daily. 12/19/23   [provider]  sertraline  (ZOLOFT ) 50 MG tablet Take 50 mg by mouth daily. 09/12/23   [provider]  sulfamethoxazole -trimethoprim  (BACTRIM  DS) 800-160 MG tablet Take 1 tablet by mouth 2 (two) times daily. 12/25/23   [provider]      Allergies    Cialis [tadalafil], Pneumovax [pneumococcal polysaccharide vaccine], Zocor [simvastatin], Ace inhibitors, and Contrast media [iodinated contrast media]    Review of Systems  Review of Systems  Physical Exam Updated Vital Signs BP (!) 147/77   Pulse 65   Temp 97.8 F (36.6 C) (Oral)   Resp 16   Ht 5' 11 (1.803 m)   Wt 80 kg   SpO2 99%   BMI 24.60 kg/m  Physical Exam Constitutional:      Comments: C-collar in place   HENT:     Head:      Comments: 6 cm laceration without active bleeding Cardiovascular:     Rate and Rhythm: Normal rate and regular rhythm.     Heart sounds: Normal heart sounds.  Pulmonary:     Effort: Pulmonary effort is normal.     Breath sounds: Normal  breath sounds.  Musculoskeletal:     Cervical back: Normal range of motion.  Neurological:     Mental Status: He is alert.     ED Results / Procedures / Treatments   Labs (all labs ordered are listed, but only abnormal results are displayed) Labs Reviewed - No data to display  EKG None  Radiology No results found.  Procedures .Laceration Repair  Date/Time: 12/31/2023 12:28 PM  Performed by: Ronni Colace, DO Authorized by: Dorenda Gandy, MD   Consent:    Consent given by:  Patient Laceration details:    Length (cm):  6     Medications Ordered in ED Medications  lidocaine  (PF) (XYLOCAINE ) 1 % injection 2 mL (has no administration in time range)    ED Course/ Medical Decision Making/ A&P                                 Medical Decision Making 83 year old male who presents after a fall resulting in left upper eyebrow laceration.  He denied LOC and daughter who was present in room did not wish to pursue imaging.  He had full range of motion in his neck and was alert and oriented x 3.  6 cm laceration repaired without complication.  Care handed over to supervising physician Dr. Liam Redhead.  Risk Prescription drug management.            Final Clinical Impression(s) / ED Diagnoses Final diagnoses:  None    Rx / DC Orders ED Discharge Orders     None         Ronni Colace, DO 12/31/23 2219    Dorenda Gandy, MD 01/05/24 1427

## 2024-01-01 DIAGNOSIS — E785 Hyperlipidemia, unspecified: Secondary | ICD-10-CM | POA: Diagnosis not present

## 2024-01-01 DIAGNOSIS — I679 Cerebrovascular disease, unspecified: Secondary | ICD-10-CM | POA: Diagnosis not present

## 2024-01-01 DIAGNOSIS — G20C Parkinsonism, unspecified: Secondary | ICD-10-CM | POA: Diagnosis not present

## 2024-01-01 DIAGNOSIS — G4089 Other seizures: Secondary | ICD-10-CM | POA: Diagnosis not present

## 2024-01-01 DIAGNOSIS — F32A Depression, unspecified: Secondary | ICD-10-CM | POA: Diagnosis not present

## 2024-01-01 DIAGNOSIS — I10 Essential (primary) hypertension: Secondary | ICD-10-CM | POA: Diagnosis not present

## 2024-01-02 DIAGNOSIS — I679 Cerebrovascular disease, unspecified: Secondary | ICD-10-CM | POA: Diagnosis not present

## 2024-01-02 DIAGNOSIS — F32A Depression, unspecified: Secondary | ICD-10-CM | POA: Diagnosis not present

## 2024-01-02 DIAGNOSIS — I10 Essential (primary) hypertension: Secondary | ICD-10-CM | POA: Diagnosis not present

## 2024-01-02 DIAGNOSIS — E785 Hyperlipidemia, unspecified: Secondary | ICD-10-CM | POA: Diagnosis not present

## 2024-01-02 DIAGNOSIS — G4089 Other seizures: Secondary | ICD-10-CM | POA: Diagnosis not present

## 2024-01-02 DIAGNOSIS — G20C Parkinsonism, unspecified: Secondary | ICD-10-CM | POA: Diagnosis not present

## 2024-01-03 DIAGNOSIS — B351 Tinea unguium: Secondary | ICD-10-CM | POA: Diagnosis not present

## 2024-01-03 DIAGNOSIS — M79675 Pain in left toe(s): Secondary | ICD-10-CM | POA: Diagnosis not present

## 2024-01-03 DIAGNOSIS — M79674 Pain in right toe(s): Secondary | ICD-10-CM | POA: Diagnosis not present

## 2024-01-04 DIAGNOSIS — G4089 Other seizures: Secondary | ICD-10-CM | POA: Diagnosis not present

## 2024-01-04 DIAGNOSIS — E785 Hyperlipidemia, unspecified: Secondary | ICD-10-CM | POA: Diagnosis not present

## 2024-01-04 DIAGNOSIS — I679 Cerebrovascular disease, unspecified: Secondary | ICD-10-CM | POA: Diagnosis not present

## 2024-01-04 DIAGNOSIS — G20C Parkinsonism, unspecified: Secondary | ICD-10-CM | POA: Diagnosis not present

## 2024-01-04 DIAGNOSIS — I10 Essential (primary) hypertension: Secondary | ICD-10-CM | POA: Diagnosis not present

## 2024-01-04 DIAGNOSIS — F32A Depression, unspecified: Secondary | ICD-10-CM | POA: Diagnosis not present

## 2024-01-07 DIAGNOSIS — G4089 Other seizures: Secondary | ICD-10-CM | POA: Diagnosis not present

## 2024-01-07 DIAGNOSIS — Z515 Encounter for palliative care: Secondary | ICD-10-CM | POA: Diagnosis not present

## 2024-01-07 DIAGNOSIS — I10 Essential (primary) hypertension: Secondary | ICD-10-CM | POA: Diagnosis not present

## 2024-01-07 DIAGNOSIS — G20A1 Parkinson's disease without dyskinesia, without mention of fluctuations: Secondary | ICD-10-CM | POA: Diagnosis not present

## 2024-01-07 DIAGNOSIS — I679 Cerebrovascular disease, unspecified: Secondary | ICD-10-CM | POA: Diagnosis not present

## 2024-01-07 DIAGNOSIS — G20C Parkinsonism, unspecified: Secondary | ICD-10-CM | POA: Diagnosis not present

## 2024-01-07 DIAGNOSIS — F32A Depression, unspecified: Secondary | ICD-10-CM | POA: Diagnosis not present

## 2024-01-07 DIAGNOSIS — F5105 Insomnia due to other mental disorder: Secondary | ICD-10-CM | POA: Diagnosis not present

## 2024-01-07 DIAGNOSIS — F331 Major depressive disorder, recurrent, moderate: Secondary | ICD-10-CM | POA: Diagnosis not present

## 2024-01-07 DIAGNOSIS — F411 Generalized anxiety disorder: Secondary | ICD-10-CM | POA: Diagnosis not present

## 2024-01-07 DIAGNOSIS — G3183 Dementia with Lewy bodies: Secondary | ICD-10-CM | POA: Diagnosis not present

## 2024-01-07 DIAGNOSIS — E785 Hyperlipidemia, unspecified: Secondary | ICD-10-CM | POA: Diagnosis not present

## 2024-01-09 DIAGNOSIS — F32A Depression, unspecified: Secondary | ICD-10-CM | POA: Diagnosis not present

## 2024-01-09 DIAGNOSIS — I679 Cerebrovascular disease, unspecified: Secondary | ICD-10-CM | POA: Diagnosis not present

## 2024-01-09 DIAGNOSIS — G4089 Other seizures: Secondary | ICD-10-CM | POA: Diagnosis not present

## 2024-01-09 DIAGNOSIS — G20C Parkinsonism, unspecified: Secondary | ICD-10-CM | POA: Diagnosis not present

## 2024-01-09 DIAGNOSIS — I10 Essential (primary) hypertension: Secondary | ICD-10-CM | POA: Diagnosis not present

## 2024-01-09 DIAGNOSIS — E785 Hyperlipidemia, unspecified: Secondary | ICD-10-CM | POA: Diagnosis not present

## 2024-01-11 DIAGNOSIS — F32A Depression, unspecified: Secondary | ICD-10-CM | POA: Diagnosis not present

## 2024-01-11 DIAGNOSIS — E785 Hyperlipidemia, unspecified: Secondary | ICD-10-CM | POA: Diagnosis not present

## 2024-01-11 DIAGNOSIS — I679 Cerebrovascular disease, unspecified: Secondary | ICD-10-CM | POA: Diagnosis not present

## 2024-01-11 DIAGNOSIS — I10 Essential (primary) hypertension: Secondary | ICD-10-CM | POA: Diagnosis not present

## 2024-01-11 DIAGNOSIS — G4089 Other seizures: Secondary | ICD-10-CM | POA: Diagnosis not present

## 2024-01-11 DIAGNOSIS — G20C Parkinsonism, unspecified: Secondary | ICD-10-CM | POA: Diagnosis not present

## 2024-01-14 DIAGNOSIS — I10 Essential (primary) hypertension: Secondary | ICD-10-CM | POA: Diagnosis not present

## 2024-01-14 DIAGNOSIS — F32A Depression, unspecified: Secondary | ICD-10-CM | POA: Diagnosis not present

## 2024-01-14 DIAGNOSIS — E785 Hyperlipidemia, unspecified: Secondary | ICD-10-CM | POA: Diagnosis not present

## 2024-01-14 DIAGNOSIS — G4089 Other seizures: Secondary | ICD-10-CM | POA: Diagnosis not present

## 2024-01-14 DIAGNOSIS — I679 Cerebrovascular disease, unspecified: Secondary | ICD-10-CM | POA: Diagnosis not present

## 2024-01-14 DIAGNOSIS — G20C Parkinsonism, unspecified: Secondary | ICD-10-CM | POA: Diagnosis not present

## 2024-01-15 DIAGNOSIS — I10 Essential (primary) hypertension: Secondary | ICD-10-CM | POA: Diagnosis not present

## 2024-01-15 DIAGNOSIS — F32A Depression, unspecified: Secondary | ICD-10-CM | POA: Diagnosis not present

## 2024-01-15 DIAGNOSIS — G4089 Other seizures: Secondary | ICD-10-CM | POA: Diagnosis not present

## 2024-01-15 DIAGNOSIS — E785 Hyperlipidemia, unspecified: Secondary | ICD-10-CM | POA: Diagnosis not present

## 2024-01-15 DIAGNOSIS — G20C Parkinsonism, unspecified: Secondary | ICD-10-CM | POA: Diagnosis not present

## 2024-01-15 DIAGNOSIS — I679 Cerebrovascular disease, unspecified: Secondary | ICD-10-CM | POA: Diagnosis not present

## 2024-01-16 DIAGNOSIS — I10 Essential (primary) hypertension: Secondary | ICD-10-CM | POA: Diagnosis not present

## 2024-01-16 DIAGNOSIS — G4089 Other seizures: Secondary | ICD-10-CM | POA: Diagnosis not present

## 2024-01-16 DIAGNOSIS — F32A Depression, unspecified: Secondary | ICD-10-CM | POA: Diagnosis not present

## 2024-01-16 DIAGNOSIS — E785 Hyperlipidemia, unspecified: Secondary | ICD-10-CM | POA: Diagnosis not present

## 2024-01-16 DIAGNOSIS — I679 Cerebrovascular disease, unspecified: Secondary | ICD-10-CM | POA: Diagnosis not present

## 2024-01-16 DIAGNOSIS — G20C Parkinsonism, unspecified: Secondary | ICD-10-CM | POA: Diagnosis not present

## 2024-01-18 DIAGNOSIS — F32A Depression, unspecified: Secondary | ICD-10-CM | POA: Diagnosis not present

## 2024-01-18 DIAGNOSIS — G20C Parkinsonism, unspecified: Secondary | ICD-10-CM | POA: Diagnosis not present

## 2024-01-18 DIAGNOSIS — E785 Hyperlipidemia, unspecified: Secondary | ICD-10-CM | POA: Diagnosis not present

## 2024-01-18 DIAGNOSIS — I679 Cerebrovascular disease, unspecified: Secondary | ICD-10-CM | POA: Diagnosis not present

## 2024-01-18 DIAGNOSIS — G4089 Other seizures: Secondary | ICD-10-CM | POA: Diagnosis not present

## 2024-01-18 DIAGNOSIS — I10 Essential (primary) hypertension: Secondary | ICD-10-CM | POA: Diagnosis not present

## 2024-01-21 DIAGNOSIS — G4089 Other seizures: Secondary | ICD-10-CM | POA: Diagnosis not present

## 2024-01-21 DIAGNOSIS — G20C Parkinsonism, unspecified: Secondary | ICD-10-CM | POA: Diagnosis not present

## 2024-01-21 DIAGNOSIS — I679 Cerebrovascular disease, unspecified: Secondary | ICD-10-CM | POA: Diagnosis not present

## 2024-01-21 DIAGNOSIS — I10 Essential (primary) hypertension: Secondary | ICD-10-CM | POA: Diagnosis not present

## 2024-01-21 DIAGNOSIS — F32A Depression, unspecified: Secondary | ICD-10-CM | POA: Diagnosis not present

## 2024-01-21 DIAGNOSIS — E785 Hyperlipidemia, unspecified: Secondary | ICD-10-CM | POA: Diagnosis not present

## 2024-01-22 DIAGNOSIS — I10 Essential (primary) hypertension: Secondary | ICD-10-CM | POA: Diagnosis not present

## 2024-01-22 DIAGNOSIS — I509 Heart failure, unspecified: Secondary | ICD-10-CM | POA: Diagnosis not present

## 2024-01-22 DIAGNOSIS — I951 Orthostatic hypotension: Secondary | ICD-10-CM | POA: Diagnosis not present

## 2024-01-22 DIAGNOSIS — H409 Unspecified glaucoma: Secondary | ICD-10-CM | POA: Diagnosis not present

## 2024-01-22 DIAGNOSIS — I679 Cerebrovascular disease, unspecified: Secondary | ICD-10-CM | POA: Diagnosis not present

## 2024-01-22 DIAGNOSIS — E785 Hyperlipidemia, unspecified: Secondary | ICD-10-CM | POA: Diagnosis not present

## 2024-01-22 DIAGNOSIS — G20C Parkinsonism, unspecified: Secondary | ICD-10-CM | POA: Diagnosis not present

## 2024-01-22 DIAGNOSIS — G3183 Dementia with Lewy bodies: Secondary | ICD-10-CM | POA: Diagnosis not present

## 2024-01-22 DIAGNOSIS — J302 Other seasonal allergic rhinitis: Secondary | ICD-10-CM | POA: Diagnosis not present

## 2024-01-22 DIAGNOSIS — G4089 Other seizures: Secondary | ICD-10-CM | POA: Diagnosis not present

## 2024-01-22 DIAGNOSIS — H3530 Unspecified macular degeneration: Secondary | ICD-10-CM | POA: Diagnosis not present

## 2024-01-22 DIAGNOSIS — F32A Depression, unspecified: Secondary | ICD-10-CM | POA: Diagnosis not present

## 2024-01-23 DIAGNOSIS — E785 Hyperlipidemia, unspecified: Secondary | ICD-10-CM | POA: Diagnosis not present

## 2024-01-23 DIAGNOSIS — I10 Essential (primary) hypertension: Secondary | ICD-10-CM | POA: Diagnosis not present

## 2024-01-23 DIAGNOSIS — G20C Parkinsonism, unspecified: Secondary | ICD-10-CM | POA: Diagnosis not present

## 2024-01-23 DIAGNOSIS — G4089 Other seizures: Secondary | ICD-10-CM | POA: Diagnosis not present

## 2024-01-23 DIAGNOSIS — F32A Depression, unspecified: Secondary | ICD-10-CM | POA: Diagnosis not present

## 2024-01-23 DIAGNOSIS — I679 Cerebrovascular disease, unspecified: Secondary | ICD-10-CM | POA: Diagnosis not present

## 2024-01-24 DIAGNOSIS — I679 Cerebrovascular disease, unspecified: Secondary | ICD-10-CM | POA: Diagnosis not present

## 2024-01-24 DIAGNOSIS — F32A Depression, unspecified: Secondary | ICD-10-CM | POA: Diagnosis not present

## 2024-01-24 DIAGNOSIS — G4089 Other seizures: Secondary | ICD-10-CM | POA: Diagnosis not present

## 2024-01-24 DIAGNOSIS — E785 Hyperlipidemia, unspecified: Secondary | ICD-10-CM | POA: Diagnosis not present

## 2024-01-24 DIAGNOSIS — G20C Parkinsonism, unspecified: Secondary | ICD-10-CM | POA: Diagnosis not present

## 2024-01-24 DIAGNOSIS — I10 Essential (primary) hypertension: Secondary | ICD-10-CM | POA: Diagnosis not present

## 2024-01-26 ENCOUNTER — Emergency Department (HOSPITAL_COMMUNITY)
Admission: EM | Admit: 2024-01-26 | Discharge: 2024-01-26 | Disposition: A | Discharge status: Home / Self Care | Source: Home / Self Care | Attending: Emergency Medicine | Admitting: Emergency Medicine

## 2024-01-26 ENCOUNTER — Emergency Department (HOSPITAL_COMMUNITY)

## 2024-01-26 ENCOUNTER — Observation Stay (HOSPITAL_COMMUNITY)
Admission: EM | Admit: 2024-01-26 | Discharge: 2024-01-27 | Disposition: A | Discharge status: Hospice | Source: Intra-hospital | Attending: Internal Medicine | Admitting: Internal Medicine

## 2024-01-26 ENCOUNTER — Other Ambulatory Visit: Payer: Self-pay

## 2024-01-26 ENCOUNTER — Encounter (HOSPITAL_COMMUNITY): Payer: Self-pay | Admitting: Emergency Medicine

## 2024-01-26 DIAGNOSIS — S3993XA Unspecified injury of pelvis, initial encounter: Secondary | ICD-10-CM | POA: Diagnosis not present

## 2024-01-26 DIAGNOSIS — S0990XA Unspecified injury of head, initial encounter: Secondary | ICD-10-CM | POA: Diagnosis not present

## 2024-01-26 DIAGNOSIS — S0101XA Laceration without foreign body of scalp, initial encounter: Secondary | ICD-10-CM | POA: Diagnosis not present

## 2024-01-26 DIAGNOSIS — I609 Nontraumatic subarachnoid hemorrhage, unspecified: Secondary | ICD-10-CM | POA: Diagnosis not present

## 2024-01-26 DIAGNOSIS — E785 Hyperlipidemia, unspecified: Secondary | ICD-10-CM | POA: Diagnosis not present

## 2024-01-26 DIAGNOSIS — S2239XA Fracture of one rib, unspecified side, initial encounter for closed fracture: Secondary | ICD-10-CM | POA: Insufficient documentation

## 2024-01-26 DIAGNOSIS — M160 Bilateral primary osteoarthritis of hip: Secondary | ICD-10-CM | POA: Diagnosis not present

## 2024-01-26 DIAGNOSIS — F10920 Alcohol use, unspecified with intoxication, uncomplicated: Secondary | ICD-10-CM | POA: Diagnosis not present

## 2024-01-26 DIAGNOSIS — S065XAA Traumatic subdural hemorrhage with loss of consciousness status unknown, initial encounter: Secondary | ICD-10-CM | POA: Insufficient documentation

## 2024-01-26 DIAGNOSIS — R918 Other nonspecific abnormal finding of lung field: Secondary | ICD-10-CM | POA: Diagnosis not present

## 2024-01-26 DIAGNOSIS — G20C Parkinsonism, unspecified: Secondary | ICD-10-CM | POA: Insufficient documentation

## 2024-01-26 DIAGNOSIS — S3991XA Unspecified injury of abdomen, initial encounter: Secondary | ICD-10-CM | POA: Diagnosis not present

## 2024-01-26 DIAGNOSIS — I951 Orthostatic hypotension: Secondary | ICD-10-CM

## 2024-01-26 DIAGNOSIS — Z515 Encounter for palliative care: Secondary | ICD-10-CM | POA: Diagnosis not present

## 2024-01-26 DIAGNOSIS — W1830XA Fall on same level, unspecified, initial encounter: Secondary | ICD-10-CM | POA: Diagnosis not present

## 2024-01-26 DIAGNOSIS — R58 Hemorrhage, not elsewhere classified: Secondary | ICD-10-CM | POA: Diagnosis not present

## 2024-01-26 DIAGNOSIS — I679 Cerebrovascular disease, unspecified: Secondary | ICD-10-CM | POA: Diagnosis not present

## 2024-01-26 DIAGNOSIS — F0390 Unspecified dementia without behavioral disturbance: Secondary | ICD-10-CM | POA: Diagnosis not present

## 2024-01-26 DIAGNOSIS — G4089 Other seizures: Secondary | ICD-10-CM | POA: Diagnosis not present

## 2024-01-26 DIAGNOSIS — F02C11 Dementia in other diseases classified elsewhere, severe, with agitation: Secondary | ICD-10-CM | POA: Diagnosis not present

## 2024-01-26 DIAGNOSIS — W19XXXA Unspecified fall, initial encounter: Secondary | ICD-10-CM | POA: Insufficient documentation

## 2024-01-26 DIAGNOSIS — R0902 Hypoxemia: Secondary | ICD-10-CM | POA: Diagnosis not present

## 2024-01-26 DIAGNOSIS — Z66 Do not resuscitate: Secondary | ICD-10-CM | POA: Diagnosis not present

## 2024-01-26 DIAGNOSIS — I6203 Nontraumatic chronic subdural hemorrhage: Secondary | ICD-10-CM

## 2024-01-26 DIAGNOSIS — N179 Acute kidney failure, unspecified: Secondary | ICD-10-CM | POA: Insufficient documentation

## 2024-01-26 DIAGNOSIS — Z7982 Long term (current) use of aspirin: Secondary | ICD-10-CM | POA: Diagnosis not present

## 2024-01-26 DIAGNOSIS — R627 Adult failure to thrive: Secondary | ICD-10-CM | POA: Diagnosis not present

## 2024-01-26 DIAGNOSIS — F32A Depression, unspecified: Secondary | ICD-10-CM | POA: Diagnosis not present

## 2024-01-26 DIAGNOSIS — S27321A Contusion of lung, unilateral, initial encounter: Secondary | ICD-10-CM | POA: Diagnosis not present

## 2024-01-26 DIAGNOSIS — S199XXA Unspecified injury of neck, initial encounter: Secondary | ICD-10-CM | POA: Diagnosis not present

## 2024-01-26 DIAGNOSIS — R9431 Abnormal electrocardiogram [ECG] [EKG]: Secondary | ICD-10-CM | POA: Diagnosis not present

## 2024-01-26 DIAGNOSIS — S066X0A Traumatic subarachnoid hemorrhage without loss of consciousness, initial encounter: Principal | ICD-10-CM | POA: Insufficient documentation

## 2024-01-26 DIAGNOSIS — R569 Unspecified convulsions: Secondary | ICD-10-CM | POA: Diagnosis not present

## 2024-01-26 DIAGNOSIS — R410 Disorientation, unspecified: Secondary | ICD-10-CM | POA: Diagnosis not present

## 2024-01-26 DIAGNOSIS — S2231XA Fracture of one rib, right side, initial encounter for closed fracture: Secondary | ICD-10-CM | POA: Diagnosis not present

## 2024-01-26 DIAGNOSIS — D649 Anemia, unspecified: Secondary | ICD-10-CM | POA: Insufficient documentation

## 2024-01-26 DIAGNOSIS — G20A1 Parkinson's disease without dyskinesia, without mention of fluctuations: Secondary | ICD-10-CM | POA: Diagnosis present

## 2024-01-26 DIAGNOSIS — R296 Repeated falls: Secondary | ICD-10-CM | POA: Diagnosis not present

## 2024-01-26 DIAGNOSIS — I10 Essential (primary) hypertension: Secondary | ICD-10-CM | POA: Insufficient documentation

## 2024-01-26 DIAGNOSIS — R404 Transient alteration of awareness: Secondary | ICD-10-CM | POA: Diagnosis not present

## 2024-01-26 DIAGNOSIS — S299XXA Unspecified injury of thorax, initial encounter: Secondary | ICD-10-CM | POA: Diagnosis not present

## 2024-01-26 DIAGNOSIS — F02C0 Dementia in other diseases classified elsewhere, severe, without behavioral disturbance, psychotic disturbance, mood disturbance, and anxiety: Secondary | ICD-10-CM

## 2024-01-26 DIAGNOSIS — R4182 Altered mental status, unspecified: Secondary | ICD-10-CM | POA: Diagnosis present

## 2024-01-26 LAB — COMPREHENSIVE METABOLIC PANEL WITH GFR
ALT: 7 U/L (ref 0–44)
AST: 23 U/L (ref 15–41)
Albumin: 3.4 g/dL — ABNORMAL LOW (ref 3.5–5.0)
Alkaline Phosphatase: 80 U/L (ref 38–126)
Anion gap: 9 (ref 5–15)
BUN: 44 mg/dL — ABNORMAL HIGH (ref 8–23)
CO2: 29 mmol/L (ref 22–32)
Calcium: 9.5 mg/dL (ref 8.9–10.3)
Chloride: 105 mmol/L (ref 98–111)
Creatinine, Ser: 0.94 mg/dL (ref 0.61–1.24)
GFR, Estimated: >60 mL/min
Glucose, Bld: 176 mg/dL — ABNORMAL HIGH (ref 70–99)
Potassium: 4.3 mmol/L (ref 3.5–5.1)
Sodium: 143 mmol/L (ref 135–145)
Total Bilirubin: 1.7 mg/dL — ABNORMAL HIGH (ref 0.0–1.2)
Total Protein: 6.6 g/dL (ref 6.5–8.1)

## 2024-01-26 LAB — I-STAT CG4 LACTIC ACID, ED: Lactic Acid, Venous: 1.5 mmol/L (ref 0.5–1.9)

## 2024-01-26 LAB — I-STAT CHEM 8, ED
BUN: 43 mg/dL — ABNORMAL HIGH (ref 8–23)
Calcium, Ion: 1.25 mmol/L (ref 1.15–1.40)
Chloride: 106 mmol/L (ref 98–111)
Creatinine, Ser: 0.9 mg/dL (ref 0.61–1.24)
Glucose, Bld: 171 mg/dL — ABNORMAL HIGH (ref 70–99)
HCT: 30 % — ABNORMAL LOW (ref 39.0–52.0)
Hemoglobin: 10.2 g/dL — ABNORMAL LOW (ref 13.0–17.0)
Potassium: 4.4 mmol/L (ref 3.5–5.1)
Sodium: 144 mmol/L (ref 135–145)
TCO2: 28 mmol/L (ref 22–32)

## 2024-01-26 LAB — ETHANOL: Alcohol, Ethyl (B): <15 mg/dL (ref <15)

## 2024-01-26 LAB — SAMPLE TO BLOOD BANK

## 2024-01-26 LAB — CBC
HCT: 30.2 % — ABNORMAL LOW (ref 39.0–52.0)
Hemoglobin: 9.9 g/dL — ABNORMAL LOW (ref 13.0–17.0)
MCH: 30.7 pg (ref 26.0–34.0)
MCHC: 32.8 g/dL (ref 30.0–36.0)
MCV: 93.8 fL (ref 80.0–100.0)
Platelets: 247 K/uL (ref 150–400)
RBC: 3.22 MIL/uL — ABNORMAL LOW (ref 4.22–5.81)
RDW: 13.3 % (ref 11.5–15.5)
WBC: 16.8 K/uL — ABNORMAL HIGH (ref 4.0–10.5)
nRBC: 0 % (ref 0.0–0.2)

## 2024-01-26 LAB — PROTIME-INR
INR: 1.2 (ref 0.8–1.2)
Prothrombin Time: 15.6 s — ABNORMAL HIGH (ref 11.4–15.2)

## 2024-01-26 MED ORDER — CARBIDOPA-LEVODOPA 25-100 MG PO TABS
2.0000 | ORAL_TABLET | Freq: Three times a day (TID) | ORAL | Status: DC
  Filled 2024-01-26: qty 2

## 2024-01-26 MED ORDER — PHENOBARBITAL 32.4 MG PO TABS
32.4000 mg | ORAL_TABLET | Freq: Two times a day (BID) | ORAL | Status: DC
  Filled 2024-01-26: qty 1

## 2024-01-26 MED ORDER — LATANOPROST 0.005 % OP SOLN
1.0000 [drp] | Freq: Every day | OPHTHALMIC | Status: DC
  Filled 2024-01-26: qty 2.5

## 2024-01-26 MED ORDER — LORAZEPAM 2 MG/ML IJ SOLN
0.5000 mg | INTRAMUSCULAR | Status: DC | PRN
  Administered 2024-01-27: 0.5 mg via INTRAVENOUS
  Filled 2024-01-26 (×2): qty 1

## 2024-01-26 MED ORDER — MIDODRINE HCL 5 MG PO TABS
5.0000 mg | ORAL_TABLET | Freq: Three times a day (TID) | ORAL | Status: DC
  Filled 2024-01-26: qty 1

## 2024-01-26 MED ORDER — SERTRALINE HCL 50 MG PO TABS: 25.0000 mg | ORAL_TABLET | Freq: Every day | ORAL | Status: DC

## 2024-01-26 MED ORDER — DOCUSATE SODIUM 100 MG PO CAPS: 100.0000 mg | ORAL_CAPSULE | Freq: Every day | ORAL | Status: DC | PRN

## 2024-01-26 MED ORDER — DIVALPROEX SODIUM 125 MG PO CSDR: 125.0000 mg | DELAYED_RELEASE_CAPSULE | Freq: Two times a day (BID) | ORAL | Status: DC

## 2024-01-26 MED ORDER — OXYBUTYNIN CHLORIDE 5 MG PO TABS
5.0000 mg | ORAL_TABLET | Freq: Two times a day (BID) | ORAL | Status: DC
  Filled 2024-01-26: qty 1

## 2024-01-26 MED ORDER — LORAZEPAM 2 MG/ML IJ SOLN
1.0000 mg | Freq: Once | INTRAMUSCULAR | Status: AC
  Administered 2024-01-26: 1 mg via INTRAVENOUS
  Filled 2024-01-26: qty 1

## 2024-01-26 MED ORDER — ACETAMINOPHEN 325 MG PO TABS: 650.0000 mg | ORAL_TABLET | Freq: Four times a day (QID) | ORAL | Status: DC | PRN

## 2024-01-26 MED ORDER — LORATADINE 10 MG PO TABS: 10.0000 mg | ORAL_TABLET | Freq: Every day | ORAL | Status: DC

## 2024-01-26 MED ORDER — FLUDROCORTISONE ACETATE 0.1 MG PO TABS: 100.0000 ug | ORAL_TABLET | Freq: Every day | ORAL | Status: DC

## 2024-01-26 NOTE — ED Triage Notes (Signed)
 Pt has multiple skin tears and bruising on both arms.

## 2024-01-26 NOTE — ED Provider Notes (Signed)
 [place] [facility]ERGENCY DEPARTMENT AT [facility] Provider Note   CSN: [id number] Arrival date & time: 01/26/24  9491     Patient presents with: Seth Carter is a 83 y.o. male with history of end-stage Parkinson's currently on hospice with a floor care living at [facility] in their memory care center.  He presents today after unwitnessed fall approximately 1 hour prior to his arrival with laceration to the posterior scalp.  Patient has had more than 10 falls in the last 5 days.  Family, daughter is at the bedside.  They are requesting no testing at this time but would like his laceration repaired as appropriate.   HPI     Prior to Admission medications   Medication Sig Start Date End Date Taking? Authorizing Provider  aspirin  EC 81 MG tablet Take 81 mg by mouth at bedtime.    [provider]  carbidopa -levodopa  (SINEMET  IR) 25-100 MG tablet Take 2 tablets by mouth 3 (three) times daily.    [provider]  cetirizine (ZYRTEC) 10 MG tablet Take 10 mg by mouth daily.    [provider]  Cyanocobalamin  (VITAMIN B-12 PO) Take 1 tablet by mouth daily with lunch.    [provider]  dorzolamide -timolol  (COSOPT ) 22.3-6.8 MG/ML ophthalmic solution Place 1 drop into both eyes 2 (two) times daily.    [provider]  droxidopa  (NORTHERA ) 300 MG CAPS Take 300 mg by mouth 3 (three) times daily with meals. 09/17/23 09/16/24  [provider]  fludrocortisone  (FLORINEF ) 0.1 MG tablet Take 100 mcg by mouth daily. 11/12/23   [provider]  latanoprost  (XALATAN ) 0.005 % ophthalmic solution Place 1 drop into both eyes at bedtime.    [provider]  LORazepam  (ATIVAN ) 0.5 MG tablet Take 0.5 mg by mouth. 12/18/23   [provider]  MAGNESIUM OXIDE PO Take 1 tablet by mouth at bedtime.    [provider]  midodrine  (PROAMATINE ) 2.5 MG tablet Take 2.5 mg by mouth 3 (three) times daily. 11/05/23    [provider]  mirabegron  ER (MYRBETRIQ ) 50 MG TB24 tablet Take 50 mg by mouth daily.    [provider]  Multiple Vitamin (MULTIVITAMIN WITH MINERALS) TABS tablet Take 1 tablet by mouth daily.    [provider]  Multiple Vitamins-Minerals (PRESERVISION/LUTEIN) CAPS Take 1 capsule by mouth daily with lunch.    [provider]  Polyethyl Glycol-Propyl Glycol 0.4-0.3 % SOLN Place 1 application  into both eyes daily.    [provider]  prednisoLONE  acetate (PRED FORTE ) 1 % ophthalmic suspension SMARTSIG:In Eye(s) 12/06/23   [provider]  Propylene Glycol (SYSTANE COMPLETE PF OP) Place 1 drop into both eyes 2 (two) times daily.    [provider]  QUEtiapine (SEROQUEL) 25 MG tablet Take 25 mg by mouth at bedtime. 12/24/23   [provider]  QUEtiapine (SEROQUEL) 50 MG tablet Take 50 mg by mouth at bedtime. 12/25/23   [provider]  rosuvastatin  (CRESTOR ) 10 MG tablet Take 10 mg by mouth at bedtime.    [provider]  sertraline  (ZOLOFT ) 25 MG tablet Take 25 mg by mouth daily. 12/19/23   [provider]  sertraline  (ZOLOFT ) 50 MG tablet Take 50 mg by mouth daily. 09/12/23   [provider]  sulfamethoxazole -trimethoprim  (BACTRIM  DS) 800-160 MG tablet Take 1 tablet by mouth 2 (two) times daily. 12/25/23   [provider]    Allergies: Cialis [tadalafil], Pneumovax [  pneumococcal polysaccharide vaccine], Zocor [simvastatin], Ace inhibitors, and Contrast media [iodinated contrast media]    Review of Systems  Skin:  Positive for wound.    Updated Vital Signs BP (!) 175/84 (BP Location: Right Arm)   Pulse 70   Temp 97.7 F (36.5 C) (Oral)   Resp 14   Ht 5' 11 (1.803 m)   SpO2 100%   BMI 24.60 kg/m   Physical Exam Vitals and nursing note reviewed.  Constitutional:      Appearance: He is not ill-appearing or toxic-appearing.  HENT:     Head: Normocephalic and atraumatic.    Eyes:     General: No scleral icterus.       Right eye: No discharge.        Left eye: No discharge.     Conjunctiva/sclera: Conjunctivae normal.     Pupils: Pupils are equal, round, and reactive to light.  Pulmonary:     Effort: Pulmonary effort is normal.  Skin:    General: Skin is warm and dry.     Findings: Laceration present.  Neurological:     General: No focal deficit present.     Mental Status: He is alert. Mental status is at baseline.  Psychiatric:        Mood and Affect: Mood normal.     (all labs ordered are listed, but only abnormal results are displayed) Labs Reviewed - No data to display  EKG: None  Radiology: No results found.   .Laceration Repair  Date/Time: 01/26/2024 6:14 AM  Performed by: Bobette Pleasant SAUNDERS, PA-C Authorized by: Bobette Pleasant SAUNDERS, PA-C   Consent:    Consent obtained:  Verbal   Consent given by:  Healthcare agent   Risks discussed:  Infection, need for additional repair, nerve damage, poor wound healing, poor cosmetic result and pain   Alternatives discussed:  No treatment Universal protocol:    Patient identity confirmed:  Arm band Anesthesia:    Anesthesia method:  None Laceration details:    Location:  Scalp   Scalp location:  Occipital   Length (cm):  3.5 Skin repair:    Repair method:  Staples   Number of staples:  2 Approximation:    Approximation:  Close Post-procedure details:    Dressing:  Non-adherent dressing and bulky dressing   Procedure completion:  Tolerated well, no immediate complications    Medications Ordered in the ED - No data to display                                  Medical Decision Making 83 year old male on hospice who presents with laceration to the posterior scalp after unwitnessed fall.  Hypertensive on intake vital signs otherwise normal.  Family at the bedside has requested avoidance of any testing at this time but would like his wound addressed as appropriate.   Wound  repaired as above.  Patient tolerated the procedure well.  Vital signs remained normal.  Patient to be discharged back to his facility, transporting POV, to resume care with his hospice team.  Jj's daughter voiced understanding of his medical evaluation and treatment plan. Each of their questions answered to their expressed satisfaction.  Return precautions were given.  Patient is well-appearing, stable, and was discharged in good condition.  This chart was dictated using voice recognition software, Dragon. Despite the best efforts of this provider to proofread and correct errors, errors may still occur which can  change documentation meaning.       Final diagnoses:  Laceration of scalp, initial encounter    ED Discharge Orders     None          Bobette Pleasant JONELLE DEVONNA 01/26/24 0615    Theadore Ozell HERO, MD 01/26/24 [phone]

## 2024-01-26 NOTE — H&P (Signed)
 History and Physical    Seth Carter FMW:[id number] DOB: 05-30-1941 DOA: 01/26/2024  Patient coming from: Memory care unit.  Chief Complaint: Falls.  Patient's daughter provided the history.  HPI: Seth Carter is a 83 y.o. male with history of Parkinson's disease, dementia, orthostatic hypotension has been having recurrent falls over the last few weeks.  Patient was initially brought to the ER at [facility] after a fall this morning and had staples placed on the scalp and was discharged following which he had another fall and was brought again.  This time patient's family agreed for scans.  ED Course: CT scan shows right frontal subarachnoid hemorrhage and chronic subdural hematoma.  Also shows right-sided 12th rib fracture and also lung contusion.  Neurosurgeon Dr. Mavis was consulted and requested no further neurosurgical procedures.  Labs show anemia.  Family at this time wants to keep patient in comfort measures.  Review of Systems: As per HPI, rest all negative.   Past Medical History:  Diagnosis Date   Acute pharyngitis    AKI (acute kidney injury) 09/29/2020   Allergic rhinitis    Benign prostatic hyperplasia with lower urinary tract symptoms 09/23/2020   Bradykinesia    Cervical spondylosis without myelopathy 12/18/2019   Chronic back pain    Chronic sinusitis    Degeneration of lumbar intervertebral disc 08/20/2020   ED (erectile dysfunction)    Enlarged prostate 12/15/2020   Essential hypertension    nuclear test in epic 01-16-2020 normal perfusion no ischemia, normal lvf and wall motion, nuclear ef 65%   Family history of hemochromatosis    Fatigue    Gastroesophageal reflux disease without esophagitis 12/18/2019   Generalized weakness    Glaucoma, both eyes    Hardening of the aorta (main artery of the heart)    Hemorrhoids    History of syncope    cardiology evaluation by dr jeffrie, note in epic 12-26-2019, work-up includes event monitor 02-04-2020( showed SR  with occ PAC & PVC rare PAT)/ echo  12-26-2019 (mild LVH, ef 50-55%, moderate MR, mild to moderate AV sclerosis no stenosis, ascending aorta 39mm) and nuclear test 01-16-2020 (normal perfusion w/ normal LV and wall motion, nuclear ef 65%)   Hyperglycemia 12/18/2019   Hypogonadism in male    Leukocytosis 09/29/2020   Macular degeneration, right eye    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   Mixed hyperlipidemia    Onychomycosis 12/18/2019   Osteoarthritis of knee 12/18/2019   Pain in hand 12/18/2019   Parkinson's disease 08/2020   Renal stone 12/18/2019   Sebaceous cyst 12/18/2019   Seizure-like activity 09/29/2020   Spinal stenosis of lumbar region 08/20/2020   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/2019   Toxic effect of venom of wasps 12/18/2019   Vitamin B12 deficiency    Vitamin D deficiency     Past Surgical History:  Procedure Laterality Date   ANKLE SURGERY Right 10-04-2015  @[facility]    debridement achilles tendon & reattachment and heel excision osteophyte   CATARACT EXTRACTION W/ INTRAOCULAR LENS  IMPLANT, BILATERAL  yrs ago   CYSTOSCOPY WITH INSERTION OF UROLIFT  2019 approx.   SHOULDER SURGERY Right 1990's   TONSILLECTOMY  age 38   TRANSURETHRAL RESECTION OF PROSTATE N/A 09/23/2020   Procedure: TRANSURETHRAL RESECTION OF THE PROSTATE (TURP);  Surgeon: Matilda Senior, MD;  Location: [facility];  Service: Urology;  Laterality: N/A;  75 MINS     reports that he has never smoked. He has never  used smokeless tobacco. He reports current alcohol  use of about 5.0 - 6.0 standard drinks of alcohol  per week. He reports that he does not use drugs.  Allergies  Allergen Reactions   Cialis [Tadalafil] Other (See Comments)    Severe back pain   Pneumovax [Pneumococcal Polysaccharide Vaccine] Swelling and Other (See Comments)    Arm swelling and pain   Polysaccharide Iron Forte [Iron Polysacch [id number]]     Unknown reaction per [facility]   Zocor [Simvastatin]  Other (See Comments)    Nightmares    Ace Inhibitors Cough   Contrast Media [Iodinated Contrast Media] Itching, Rash and Other (See Comments)    Possible delayed reaction = all-over body rash and seizures that began after this was used    Family History  Problem Relation Age of Onset   Cancer Mother    Heart attack Father    Melanoma Father    Alzheimer's disease Sister    Heart disease Sister    Other Sister        Polio   Thyroid  disease Daughter    Hemachromatosis Daughter    High blood pressure Daughter    Diabetes Daughter     Prior to Admission medications   Medication Sig Start Date End Date Taking? Authorizing Provider  acetaminophen  (TYLENOL ) 325 MG tablet Take 650 mg by mouth every 6 (six) hours as needed for mild pain (pain score 1-3).   Yes [provider]  carbidopa -levodopa  (SINEMET  IR) 25-100 MG tablet Take 2 tablets by mouth 3 (three) times daily.   Yes [provider]  cetirizine (ZYRTEC) 10 MG tablet Take 10 mg by mouth daily.   Yes [provider]  divalproex  (DEPAKOTE  SPRINKLE) 125 MG capsule Take 125 mg by mouth 2 (two) times daily.   Yes [provider]  docusate sodium  (COLACE) 100 MG capsule Take 100 mg by mouth daily as needed for mild constipation.   Yes [provider]  fludrocortisone  (FLORINEF ) 0.1 MG tablet Take 100 mcg by mouth daily. 11/12/23  Yes [provider]  latanoprost  (XALATAN ) 0.005 % ophthalmic solution Place 1 drop into both eyes at bedtime.   Yes [provider]  midodrine  (PROAMATINE ) 5 MG tablet Take 5 mg by mouth 3 (three) times daily. 11/05/23  Yes [provider]  oxybutynin  (DITROPAN ) 5 MG tablet Take 5 mg by mouth 2 (two) times daily.   Yes [provider]  PHENobarbital  (LUMINAL) 32.4 MG tablet Take 32.4 mg by mouth every 12 (twelve) hours.   Yes [provider]  sertraline  (ZOLOFT ) 25 MG tablet Take 25 mg by mouth daily. 12/19/23  Yes [provider]  dorzolamide -timolol  (COSOPT ) 22.3-6.8 MG/ML ophthalmic solution Place 1 drop into both eyes 2 (two) times daily. Patient not taking: Reported on 01/26/2024    [provider]  droxidopa  (NORTHERA ) 300 MG CAPS Take 300 mg by mouth 3 (three) times daily with meals. Patient not taking: Reported on 01/26/2024 09/17/23 09/16/24  [provider]  LORazepam  (ATIVAN ) 0.5 MG tablet Take 0.5 mg by mouth daily at 2 PM. Patient not taking: Reported on 01/26/2024 12/18/23   [provider]  mirabegron  ER (MYRBETRIQ ) 50 MG TB24 tablet Take 50 mg by mouth daily. Patient not taking: Reported on 01/26/2024    [provider]  prednisoLONE  acetate (PRED FORTE ) 1 % ophthalmic suspension SMARTSIG:In Eye(s) Patient not taking: Reported on 01/26/2024 12/06/23   [provider]  QUEtiapine (SEROQUEL) 25 MG tablet Take 25 mg by  mouth at bedtime. Patient not taking: Reported on 01/26/2024 12/24/23   [provider]  QUEtiapine (SEROQUEL) 50 MG tablet Take 50 mg by mouth at bedtime. Patient not taking: Reported on 01/26/2024 12/25/23   [provider]  rosuvastatin  (CRESTOR ) 10 MG tablet Take 10 mg by mouth at bedtime. Patient not taking: Reported on 01/26/2024    [provider]  sulfamethoxazole -trimethoprim  (BACTRIM  DS) 800-160 MG tablet Take 1 tablet by mouth 2 (two) times daily. Patient not taking: Reported on 01/26/2024 12/25/23   [provider]    Physical Exam: Constitutional: Moderately built and nourished. Vitals:   01/26/24 1815 01/26/24 2000 01/26/24 2030 01/26/24 2122  BP: 129/82 (!) 143/73 (!) 151/84 136/84  Pulse: 72 76 79 79  Resp: 20   16  Temp:      TempSrc:      SpO2: 100% 100% 100% 100%   Eyes: Anicteric no pallor. ENMT: No discharge from the ears eyes nose and mouth. Neck: No mass felt.  No neck rigidity. Respiratory: No rhonchi or crepitations. Cardiovascular: S1-S2 heard. Abdomen: Soft nontender bowel sound  present. Musculoskeletal: No edema. Skin: Multiple bruises. Neurologic: Lethargic after receiving Ativan . Psychiatric: Lethargic after receiving Ativan .   Labs on Admission: I have personally reviewed following labs and imaging studies  CBC: Recent Labs  Lab 01/26/24 1817 01/26/24 1833  WBC 16.8*  --   HGB 9.9* 10.2*  HCT 30.2* 30.0*  MCV 93.8  --   PLT 247  --    Basic Metabolic Panel: Recent Labs  Lab 01/26/24 1817 01/26/24 1833  NA 143 144  K 4.3 4.4  CL 105 106  CO2 29  --   GLUCOSE 176* 171*  BUN 44* 43*  CREATININE 0.94 0.90  CALCIUM  9.5  --    GFR: CrCl cannot be calculated (Unknown ideal weight.). Liver Function Tests: Recent Labs  Lab 01/26/24 1817  AST 23  ALT 7  ALKPHOS 80  BILITOT 1.7*  PROT 6.6  ALBUMIN 3.4*   No results for input(s): LIPASE, AMYLASE in the last 168 hours. No results for input(s): AMMONIA in the last 168 hours. Coagulation Profile: Recent Labs  Lab 01/26/24 1817  INR 1.2   Cardiac Enzymes: No results for input(s): CKTOTAL, CKMB, CKMBINDEX, TROPONINI in the last 168 hours. BNP (last 3 results) No results for input(s): PROBNP in the last 8760 hours. HbA1C: No results for input(s): HGBA1C in the last 72 hours. CBG: No results for input(s): GLUCAP in the last 168 hours. Lipid Profile: No results for input(s): CHOL, HDL, LDLCALC, TRIG, CHOLHDL, LDLDIRECT in the last 72 hours. Thyroid  Function Tests: No results for input(s): TSH, T4TOTAL, FREET4, T3FREE, THYROIDAB in the last 72 hours. Anemia Panel: No results for input(s): VITAMINB12, FOLATE, FERRITIN, TIBC, IRON, RETICCTPCT in the last 72 hours. Urine analysis:    Component Value Date/Time   COLORURINE AMBER (A) 04/08/2023 2005   APPEARANCEUR CLOUDY (A) 04/08/2023 2005   LABSPEC 1.018 04/08/2023 2005   PHURINE 7.0 04/08/2023 2005   GLUCOSEU NEGATIVE 04/08/2023 2005   HGBUR NEGATIVE 04/08/2023 2005   BILIRUBINUR  NEGATIVE 04/08/2023 2005   KETONESUR 5 (A) 04/08/2023 2005   PROTEINUR 30 (A) 04/08/2023 2005   NITRITE NEGATIVE 04/08/2023 2005   LEUKOCYTESUR MODERATE (A) 04/08/2023 2005   Sepsis Labs: @LABRCNTIP (procalcitonin:4,lacticidven:4) )No results found for this or any previous visit (from the past 240 hours).   Radiological Exams on Admission: CT HEAD WO CONTRAST Addendum Date: 01/26/2024 ADDENDUM REPORT: 01/26/2024 20:43 ADDENDUM: These results  were called by telephone at the time of interpretation on 01/26/2024 at 8:42 pm to provider SCOTT ZACKOWSKI , who verbally acknowledged these results. Electronically Signed   By: Morgane  Naveau M.D.   On: 01/26/2024 20:43   Result Date: 01/26/2024 CLINICAL DATA:  Head trauma, moderate-severe; Polytrauma, blunt EXAM: CT HEAD WITHOUT CONTRAST CT CERVICAL SPINE WITHOUT CONTRAST TECHNIQUE: Multidetector CT imaging of the head and cervical spine was performed following the standard protocol without intravenous contrast. Multiplanar CT image reconstructions of the cervical spine were also generated. RADIATION DOSE REDUCTION: This exam was performed according to the departmental dose-optimization program which includes automated exposure control, adjustment of the mA and/or kV according to patient size and/or use of iterative reconstruction technique. COMPARISON:  None Available. FINDINGS: CT HEAD FINDINGS Brain: Cerebral ventricle sizes are concordant with the degree of cerebral volume loss. Patchy and confluent areas of decreased attenuation are noted throughout the deep and periventricular white matter of the cerebral hemispheres bilaterally, compatible with chronic microvascular ischemic disease. No evidence of large-territorial acute infarction. No parenchymal hemorrhage. No mass lesion. Acute small right frontal subarachnoid hemorrhage (2:26, 28; 7:125). Chronic 3 mm right subdural hematoma. No mass effect or midline shift. No hydrocephalus. Basilar cisterns are patent.  Vascular: No hyperdense vessel. Skull: No acute fracture or focal lesion. Sinuses/Orbits: Paranasal sinuses and mastoid air cells are clear. The orbits are unremarkable. Other: Midline posterior scalp subcutaneus soft tissue edema and emphysema with overlying skin staples. CT CERVICAL SPINE FINDINGS Alignment: Normal. Skull base and vertebrae: Multilevel moderate severe degenerative changes of the spine. No associated severe osseous neural foraminal or central canal stenosis. No acute fracture. No aggressive appearing focal osseous lesion or focal pathologic process. Soft tissues and spinal canal: No prevertebral fluid or swelling. No visible canal hematoma. Upper chest: Unremarkable. Other: Right atherosclerotic plaque of the aortic arch and its main branches. IMPRESSION: 1. Small right frontal subarachnoid hemorrhage. 2. Chronic 3 mm right subdural hematoma. 3. No acute displaced fracture or traumatic listhesis of the cervical spine. 4.  Aortic Atherosclerosis (ICD10-I70.0). Electronically Signed: By: Morgane  Naveau M.D. On: 01/26/2024 19:52   CT CERVICAL SPINE WO CONTRAST Addendum Date: 01/26/2024 ADDENDUM REPORT: 01/26/2024 20:43 ADDENDUM: These results were called by telephone at the time of interpretation on 01/26/2024 at 8:42 pm to provider SCOTT ZACKOWSKI , who verbally acknowledged these results. Electronically Signed   By: Morgane  Naveau M.D.   On: 01/26/2024 20:43   Result Date: 01/26/2024 CLINICAL DATA:  Head trauma, moderate-severe; Polytrauma, blunt EXAM: CT HEAD WITHOUT CONTRAST CT CERVICAL SPINE WITHOUT CONTRAST TECHNIQUE: Multidetector CT imaging of the head and cervical spine was performed following the standard protocol without intravenous contrast. Multiplanar CT image reconstructions of the cervical spine were also generated. RADIATION DOSE REDUCTION: This exam was performed according to the departmental dose-optimization program which includes automated exposure control, adjustment of the mA  and/or kV according to patient size and/or use of iterative reconstruction technique. COMPARISON:  None Available. FINDINGS: CT HEAD FINDINGS Brain: Cerebral ventricle sizes are concordant with the degree of cerebral volume loss. Patchy and confluent areas of decreased attenuation are noted throughout the deep and periventricular white matter of the cerebral hemispheres bilaterally, compatible with chronic microvascular ischemic disease. No evidence of large-territorial acute infarction. No parenchymal hemorrhage. No mass lesion. Acute small right frontal subarachnoid hemorrhage (2:26, 28; 7:125). Chronic 3 mm right subdural hematoma. No mass effect or midline shift. No hydrocephalus. Basilar cisterns are patent. Vascular: No hyperdense vessel. Skull: No acute fracture  or focal lesion. Sinuses/Orbits: Paranasal sinuses and mastoid air cells are clear. The orbits are unremarkable. Other: Midline posterior scalp subcutaneus soft tissue edema and emphysema with overlying skin staples. CT CERVICAL SPINE FINDINGS Alignment: Normal. Skull base and vertebrae: Multilevel moderate severe degenerative changes of the spine. No associated severe osseous neural foraminal or central canal stenosis. No acute fracture. No aggressive appearing focal osseous lesion or focal pathologic process. Soft tissues and spinal canal: No prevertebral fluid or swelling. No visible canal hematoma. Upper chest: Unremarkable. Other: Right atherosclerotic plaque of the aortic arch and its main branches. IMPRESSION: 1. Small right frontal subarachnoid hemorrhage. 2. Chronic 3 mm right subdural hematoma. 3. No acute displaced fracture or traumatic listhesis of the cervical spine. 4.  Aortic Atherosclerosis (ICD10-I70.0). Electronically Signed: By: Morgane  Naveau M.D. On: 01/26/2024 19:52   CT CHEST ABDOMEN PELVIS WO CONTRAST Result Date: 01/26/2024 CLINICAL DATA:  Poly trauma, blunt due to a fall. EXAM: CT CHEST, ABDOMEN AND PELVIS WITHOUT CONTRAST  TECHNIQUE: Multidetector CT imaging of the chest, abdomen and pelvis was performed following the standard protocol without IV contrast. RADIATION DOSE REDUCTION: This exam was performed according to the departmental dose-optimization program which includes automated exposure control, adjustment of the mA and/or kV according to patient size and/or use of iterative reconstruction technique. COMPARISON:  Chest radiograph 01/26/2024. Pelvic radiographs 01/26/2024. CT chest abdomen and pelvis 10/08/2023 FINDINGS: CT CHEST FINDINGS Cardiovascular: Normal heart size. No pericardial effusions. Normal caliber thoracic aorta. Calcification of the aorta and coronary arteries. Mediastinum/Nodes: Esophagus is decompressed. No significant lymphadenopathy. Thyroid  gland is unremarkable. No mediastinal infiltration or collection. Lungs/Pleura: Motion artifact limits evaluation. Consolidation in both lung bases could be due to pneumonia or compressive atelectasis. Pulmonary contusions would also be a possibility in the setting of trauma. No pleural effusion or pneumothorax. Musculoskeletal: Degenerative changes in the spine and shoulders. Nondisplaced fracture of the right posterior twelfth rib. No vertebral compression deformities. Sternum appears intact. CT ABDOMEN PELVIS FINDINGS Hepatobiliary: Noncontrast examination limits evaluation of solid organs. As visualized, the liver parenchyma is homogeneous without focal lesion identified. Gallbladder and bile ducts are normal. Pancreas: Unremarkable. No pancreatic ductal dilatation or surrounding inflammatory changes. Spleen: No splenic injury or perisplenic hematoma. Adrenals/Urinary Tract: No adrenal hemorrhage or renal injury identified. Bladder is unremarkable. Stomach/Bowel: Stomach is within normal limits. Appendix appears normal. No evidence of bowel wall thickening, distention, or inflammatory changes. Motion artifact limits evaluation. Vascular/Lymphatic: Aortic  atherosclerosis. No enlarged abdominal or pelvic lymph nodes. Reproductive: Prostate is unremarkable. Other: No abdominal wall hernia or abnormality. No abdominopelvic ascites. Musculoskeletal: Degenerative changes in the spine. Normal alignment. No vertebral compression deformities. Degenerative changes in the hips. No acute displaced fractures are identified. IMPRESSION: 1. Nondisplaced fracture of the right posterior twelfth rib. No other acute bony abnormalities. 2. Infiltration, atelectasis, or contusion demonstrated in both lung bases. No pleural effusion or pneumothorax. 3. No evidence of mediastinal injury, solid organ injury, or bowel perforation on noncontrast imaging. 4. Aortic atherosclerosis. 5. Degenerative changes in the spine. Electronically Signed   By: Elsie Gravely M.D.   On: 01/26/2024 19:58   DG Pelvis Portable Result Date: 01/26/2024 CLINICAL DATA:  Trauma, fall EXAM: PORTABLE PELVIS 1-2 VIEWS COMPARISON:  10/08/2023 FINDINGS: Mild symmetric degenerative changes in the hips with joint space narrowing and spurring. SI joints symmetric and unremarkable. No acute bony abnormality. Specifically, no fracture, subluxation, or dislocation. IMPRESSION: No acute bony abnormality. Mild symmetric degenerative changes in the hips. Electronically Signed   By: Franky  Dover M.D.   On: 01/26/2024 18:28   DG Chest Port 1 View Result Date: 01/26/2024 CLINICAL DATA:  Trauma, fall EXAM: PORTABLE CHEST 1 VIEW COMPARISON:  10/08/2023 FINDINGS: Heart and mediastinal contours within normal limits. Right lung clear. Focal opacity at the left lung base could reflect atelectasis or infiltrate. No effusions or pneumothorax. No acute bony abnormality. IMPRESSION: Left lower lobe atelectasis or infiltrate. Electronically Signed   By: Franky Crease M.D.   On: 01/26/2024 18:27      Assessment/Plan Principal Problem:   Falls Active Problems:   Essential hypertension   Parkinson's disease (HCC)   Orthostatic  hypotension   Anemia   Rib fracture   SAH (subarachnoid hemorrhage) (HCC)   SDH (subdural hematoma) (HCC)    Subarachnoid hemorrhage and chronic subdural hematoma per neurosurgery no neurosurgical procedures indicated. Parkinson's disease on Sinemet . Orthostatic hypotension on midodrine . Anemia follow CBC. Rib fracture and lung contusion after fall.  Had extensive discussion with patient's daughter who was at the bedside.  At this time family is requesting comfort measures with no further tests.  Will consult palliative care.  At this time patient has been placed on as needed Ativan  for any agitation.   DVT prophylaxis: SCDs. Code Status: DNR. Family Communication: Patient's daughter. Disposition Plan: Medical floor. Consults called: Palliative care. Admission status: Observation.

## 2024-01-26 NOTE — ED Notes (Signed)
 Daughter took patient back to [facility]

## 2024-01-26 NOTE — Consult Note (Signed)
 I was contacted by Dr Zackowski regarding this patient.  He is an 83 year old white male with end-stage Parkinson's disease DNR in hospice who has fallen multiple times at a skilled nursing facility.  A head scan was obtained which demonstrated a small subarachnoid hemorrhage and subdural hematoma.  My advice was requested.    The patient is not a candidate for surgery and I have nothing to offer him.

## 2024-01-26 NOTE — Discharge Instructions (Signed)
 Yancey's staples will need to be removed in 5 days.

## 2024-01-26 NOTE — ED Notes (Signed)
 Pt sleeping d/t ativan . Daughter stated he has been agitated prior to ativan  and was agreeable to hold off on night time meds until he is more awake again.

## 2024-01-26 NOTE — ED Provider Notes (Signed)
 [place] [facility]MENT AT [facility] Provider Note   CSN: [id number] Arrival date & time: 01/26/24  1803     Patient presents with: Seth Carter is a 83 y.o. male.  {Add pertinent medical, surgical, social history, OB history to HPI:32947} HPI     Prior to Admission medications   Medication Sig Start Date End Date Taking? Authorizing Provider  aspirin  EC 81 MG tablet Take 81 mg by mouth at bedtime.    [provider]  carbidopa -levodopa  (SINEMET  IR) 25-100 MG tablet Take 2 tablets by mouth 3 (three) times daily.    [provider]  cetirizine (ZYRTEC) 10 MG tablet Take 10 mg by mouth daily.    [provider]  Cyanocobalamin  (VITAMIN B-12 PO) Take 1 tablet by mouth daily with lunch.    [provider]  dorzolamide -timolol  (COSOPT ) 22.3-6.8 MG/ML ophthalmic solution Place 1 drop into both eyes 2 (two) times daily.    [provider]  droxidopa  (NORTHERA ) 300 MG CAPS Take 300 mg by mouth 3 (three) times daily with meals. 09/17/23 09/16/24  [provider]  fludrocortisone  (FLORINEF ) 0.1 MG tablet Take 100 mcg by mouth daily. 11/12/23   [provider]  latanoprost  (XALATAN ) 0.005 % ophthalmic solution Place 1 drop into both eyes at bedtime.    [provider]  LORazepam  (ATIVAN ) 0.5 MG tablet Take 0.5 mg by mouth. 12/18/23   [provider]  MAGNESIUM OXIDE PO Take 1 tablet by mouth at bedtime.    [provider]  midodrine  (PROAMATINE ) 2.5 MG tablet Take 2.5 mg by mouth 3 (three) times daily. 11/05/23   [provider]  mirabegron  ER (MYRBETRIQ ) 50 MG TB24 tablet Take 50 mg by mouth daily.    [provider]  Multiple Vitamin (MULTIVITAMIN WITH MINERALS) TABS tablet Take 1 tablet by mouth daily.    [provider]  Multiple Vitamins-Minerals (PRESERVISION/LUTEIN) CAPS Take 1 capsule by mouth daily with lunch.    [provider]  Polyethyl  Glycol-Propyl Glycol 0.4-0.3 % SOLN Place 1 application  into both eyes daily.    [provider]  prednisoLONE  acetate (PRED FORTE ) 1 % ophthalmic suspension SMARTSIG:In Eye(s) 12/06/23   [provider]  Propylene Glycol (SYSTANE COMPLETE PF OP) Place 1 drop into both eyes 2 (two) times daily.    [provider]  QUEtiapine (SEROQUEL) 25 MG tablet Take 25 mg by mouth at bedtime. 12/24/23   [provider]  QUEtiapine (SEROQUEL) 50 MG tablet Take 50 mg by mouth at bedtime. 12/25/23   [provider]  rosuvastatin  (CRESTOR ) 10 MG tablet Take 10 mg by mouth at bedtime.    [provider]  sertraline  (ZOLOFT ) 25 MG tablet Take 25 mg by mouth daily. 12/19/23   [provider]  sertraline  (ZOLOFT ) 50 MG tablet Take 50 mg by mouth daily. 09/12/23   [provider]  sulfamethoxazole -trimethoprim  (BACTRIM  DS) 800-160 MG tablet Take 1 tablet by mouth 2 (two) times daily. 12/25/23   [provider]    Allergies: Cialis [tadalafil], Pneumovax [pneumococcal polysaccharide vaccine], Zocor [simvastatin], Ace inhibitors, and Contrast media [iodinated contrast media]    Review of Systems  Updated Vital Signs BP 129/82   Pulse 72   Temp (!) 97.4 F (36.3 C) (Axillary)   Resp 20   SpO2 100%   Physical Exam  (all labs ordered are listed, but only abnormal results are displayed) Labs Reviewed  COMPREHENSIVE METABOLIC PANEL WITH  GFR - Abnormal; Notable for the following components:      Result Value   Glucose, Bld 176 (*)    BUN 44 (*)    Albumin 3.4 (*)    Total Bilirubin 1.7 (*)    All other components within normal limits  CBC - Abnormal; Notable for the following components:   WBC 16.8 (*)    RBC 3.22 (*)    Hemoglobin 9.9 (*)    HCT 30.2 (*)    All other components within normal limits  PROTIME-INR - Abnormal; Notable for the following components:   Prothrombin Time 15.6 (*)    All other components within normal limits   I-STAT CHEM 8, ED - Abnormal; Notable for the following components:   BUN 43 (*)    Glucose, Bld 171 (*)    Hemoglobin 10.2 (*)    HCT 30.0 (*)    All other components within normal limits  ETHANOL  URINALYSIS, ROUTINE W REFLEX MICROSCOPIC  I-STAT CG4 LACTIC ACID, ED  SAMPLE TO BLOOD BANK    EKG: EKG Interpretation Date/Time:  Saturday January 26 2024 18:14:39 EDT Ventricular Rate:  78 PR Interval:    QRS Duration:  96 QT Interval:  396 QTC Calculation: 452 R Axis:   47  Text Interpretation: Normal sinus rhythm Probable LVH with secondary repol abnrm Confirmed by Latrece Nitta [phone]) on 01/26/2024 6:24:30 PM  Radiology: ARCOLA Pelvis Portable Result Date: 01/26/2024 CLINICAL DATA:  Trauma, fall EXAM: PORTABLE PELVIS 1-2 VIEWS COMPARISON:  10/08/2023 FINDINGS: Mild symmetric degenerative changes in the hips with joint space narrowing and spurring. SI joints symmetric and unremarkable. No acute bony abnormality. Specifically, no fracture, subluxation, or dislocation. IMPRESSION: No acute bony abnormality. Mild symmetric degenerative changes in the hips. Electronically Signed   By: Franky Crease M.D.   On: 01/26/2024 18:28   DG Chest Port 1 View Result Date: 01/26/2024 CLINICAL DATA:  Trauma, fall EXAM: PORTABLE CHEST 1 VIEW COMPARISON:  10/08/2023 FINDINGS: Heart and mediastinal contours within normal limits. Right lung clear. Focal opacity at the left lung base could reflect atelectasis or infiltrate. No effusions or pneumothorax. No acute bony abnormality. IMPRESSION: Left lower lobe atelectasis or infiltrate. Electronically Signed   By: Franky Crease M.D.   On: 01/26/2024 18:27    {Document cardiac monitor, telemetry assessment procedure when appropriate:32947} Procedures   Medications Ordered in the ED - No data to display    {Click here for ABCD2, HEART and other calculators REFRESH Note before signing:1}                              Medical Decision Making Amount and/or  Complexity of Data Reviewed Labs: ordered. Radiology: ordered.   ***  {Document critical care time when appropriate  Document review of labs and clinical decision tools ie CHADS2VASC2, etc  Document your independent review of radiology images and any outside records  Document your discussion with family members, caretakers and with consultants  Document social determinants of health affecting pt's care  Document your decision making why or why not admission, treatments were needed:32947:::1}   Final diagnoses:  None    ED Discharge Orders     None

## 2024-01-26 NOTE — Progress Notes (Signed)
 Orthopedic Tech Progress Note Patient Details:  Seth Carter 09-01-1940 [id number]  Level II trauma, no ortho tech orders at this time.  Patient ID: [name], male   DOB: [birth year], 83 y.o.   MRN: [id number]  Tinnie Ronal Brasil 01/26/2024, 6:57 PM

## 2024-01-26 NOTE — ED Triage Notes (Signed)
 Per facility pt is more altered than normal. Pt is confused at baseline but can normally walk around.   Pt is following commands for EDP duriing triage

## 2024-01-26 NOTE — ED Triage Notes (Signed)
 PT BIB EMS coming from [facility]. Pt had an unwitness fall 1 hr ago. Laceration to head. No LOC, not on thinners. Hx: Dementia and Parkinson. Hospice patient. [facility] nurse only wants Laceration stitched)

## 2024-01-26 NOTE — ED Notes (Incomplete)
 Trauma Response Nurse Documentation   ARVELL PULSIFER is a 83 y.o. male arriving to [facility] ED via {Trauma ED/EMS:26864}  On {meds; anticoagulants:31417}. Trauma was activated as a {Trauma Level:26868} by *** based on the following trauma criteria {Trauma criteria:26865}.  Patient cleared for CT by Dr. FERNAND Pt transported to {TRN Radiology:26861::CT} with trauma response nurse present to monitor. RN remained with the patient throughout their absence from the department for clinical observation.   [facility] ***.  Trauma MD Arrival Time: ***.  History   Past Medical History:  Diagnosis Date   Acute pharyngitis    AKI (acute kidney injury) 09/29/2020   Allergic rhinitis    Benign prostatic hyperplasia with lower urinary tract symptoms 09/23/2020   Bradykinesia    Cervical spondylosis without myelopathy 12/18/2019   Chronic back pain    Chronic sinusitis    Degeneration of lumbar intervertebral disc 08/20/2020   ED (erectile dysfunction)    Enlarged prostate 12/15/2020   Essential hypertension    nuclear test in epic 01-16-2020 normal perfusion no ischemia, normal lvf and wall motion, nuclear ef 65%   Family history of hemochromatosis    Fatigue    Gastroesophageal reflux disease without esophagitis 12/18/2019   Generalized weakness    Glaucoma, both eyes    Hardening of the aorta (main artery of the heart)    Hemorrhoids    History of syncope    cardiology evaluation by dr jeffrie, note in epic 12-26-2019, work-up includes event monitor 02-04-2020( showed SR with occ PAC & PVC rare PAT)/ echo  12-26-2019 (mild LVH, ef 50-55%, moderate MR, mild to moderate AV sclerosis no stenosis, ascending aorta 39mm) and nuclear test 01-16-2020 (normal perfusion w/ normal LV and wall motion, nuclear ef 65%)   Hyperglycemia 12/18/2019   Hypogonadism in male    Leukocytosis 09/29/2020   Macular degeneration, right eye    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   Mixed hyperlipidemia     Onychomycosis 12/18/2019   Osteoarthritis of knee 12/18/2019   Pain in hand 12/18/2019   Parkinson's disease 08/2020   Renal stone 12/18/2019   Sebaceous cyst 12/18/2019   Seizure-like activity 09/29/2020   Spinal stenosis of lumbar region 08/20/2020   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/2019   Toxic effect of venom of wasps 12/18/2019   Vitamin B12 deficiency    Vitamin D deficiency      Past Surgical History:  Procedure Laterality Date   ANKLE SURGERY Right 10-04-2015  @[facility]    debridement achilles tendon & reattachment and heel excision osteophyte   CATARACT EXTRACTION W/ INTRAOCULAR LENS  IMPLANT, BILATERAL  yrs ago   CYSTOSCOPY WITH INSERTION OF UROLIFT  2019 approx.   SHOULDER SURGERY Right 1990's   TONSILLECTOMY  age 66   TRANSURETHRAL RESECTION OF PROSTATE N/A 09/23/2020   Procedure: TRANSURETHRAL RESECTION OF THE PROSTATE (TURP);  Surgeon: Matilda Senior, MD;  Location: [facility];  Service: Urology;  Laterality: N/A;  75 MINS       Initial Focused Assessment (If applicable, or please see trauma documentation):   CT's Completed:   {Trauma CT:26866}   Interventions:   Plan for disposition:  {Trauma Dispo:26867}   Consults completed:  {Trauma Consults:26862} at ***.  Event Summary:  MTP Summary (If applicable):   Bedside handoff with {Trauma handoff:26863::ED RN} ***.    Darice HERO Brandice Busser  Trauma Response RN  Please call TRN at [phone] for further assistance.

## 2024-01-26 NOTE — ED Triage Notes (Signed)
 PT BIB EMS from [facility] care. Pt fell earlier today and was seen at [facility] and had staples placed in back of head. Pt fell again this afternoon unwitnessed/ pt bleeding around staples. Pt has bruisings to right flank and right butock and right hip of varing colors. Pt is alert and confused. Not following commands at this time. Pt was complaining of head neck and back pain when EMS arrived today. Pt has c collar in place.   EMS Vitals  179/80 76 pulse 97.4 temp Cbg 162

## 2024-01-27 ENCOUNTER — Encounter (HOSPITAL_COMMUNITY): Payer: Self-pay | Admitting: Internal Medicine

## 2024-01-27 DIAGNOSIS — I10 Essential (primary) hypertension: Secondary | ICD-10-CM | POA: Diagnosis not present

## 2024-01-27 DIAGNOSIS — Z515 Encounter for palliative care: Secondary | ICD-10-CM

## 2024-01-27 DIAGNOSIS — Z7189 Other specified counseling: Secondary | ICD-10-CM

## 2024-01-27 DIAGNOSIS — R296 Repeated falls: Secondary | ICD-10-CM

## 2024-01-27 DIAGNOSIS — F32A Depression, unspecified: Secondary | ICD-10-CM | POA: Diagnosis not present

## 2024-01-27 DIAGNOSIS — G4089 Other seizures: Secondary | ICD-10-CM | POA: Diagnosis not present

## 2024-01-27 DIAGNOSIS — E785 Hyperlipidemia, unspecified: Secondary | ICD-10-CM | POA: Diagnosis not present

## 2024-01-27 DIAGNOSIS — I679 Cerebrovascular disease, unspecified: Secondary | ICD-10-CM | POA: Diagnosis not present

## 2024-01-27 DIAGNOSIS — G20C Parkinsonism, unspecified: Secondary | ICD-10-CM | POA: Diagnosis not present

## 2024-01-27 MED ORDER — GLYCOPYRROLATE 1 MG PO TABS
1.0000 mg | ORAL_TABLET | ORAL | Status: DC | PRN
Start: 2024-01-27 — End: 2024-01-27

## 2024-01-27 MED ORDER — GLYCOPYRROLATE 0.2 MG/ML IJ SOLN: 0.2000 mg | INTRAMUSCULAR | Status: DC | PRN

## 2024-01-27 MED ORDER — ONDANSETRON HCL 4 MG/2ML IJ SOLN: 4.0000 mg | Freq: Four times a day (QID) | INTRAMUSCULAR | Status: DC | PRN

## 2024-01-27 MED ORDER — ONDANSETRON 4 MG PO TBDP
4.0000 mg | ORAL_TABLET | Freq: Four times a day (QID) | ORAL | Status: DC | PRN
Start: 2024-01-27 — End: 2024-01-27

## 2024-01-27 MED ORDER — HYDROMORPHONE HCL 1 MG/ML IJ SOLN
0.5000 mg | INTRAMUSCULAR | Status: DC
  Administered 2024-01-27 (×3): 0.5 mg via INTRAVENOUS
  Filled 2024-01-27 (×3): qty 1

## 2024-01-27 MED ORDER — HALOPERIDOL LACTATE 5 MG/ML IJ SOLN: 0.5000 mg | INTRAMUSCULAR | Status: DC | PRN

## 2024-01-27 MED ORDER — LORAZEPAM 2 MG/ML IJ SOLN
1.0000 mg | INTRAMUSCULAR | Status: DC
  Administered 2024-01-27 (×3): 1 mg via INTRAVENOUS
  Filled 2024-01-27 (×3): qty 1

## 2024-01-27 MED ORDER — POLYVINYL ALCOHOL 1.4 % OP SOLN: 1.0000 [drp] | Freq: Four times a day (QID) | OPHTHALMIC | Status: DC | PRN

## 2024-01-27 MED ORDER — BIOTENE DRY MOUTH MT LIQD: 15.0000 mL | OROMUCOSAL | Status: DC | PRN

## 2024-01-27 MED ORDER — HALOPERIDOL 0.5 MG PO TABS
0.5000 mg | ORAL_TABLET | ORAL | Status: DC | PRN
Start: 2024-01-27 — End: 2024-01-27

## 2024-01-27 MED ORDER — MICONAZOLE NITRATE 2 % EX CREA
TOPICAL_CREAM | Freq: Two times a day (BID) | CUTANEOUS | Status: DC
  Filled 2024-01-27: qty 28.4

## 2024-01-27 MED ORDER — HALOPERIDOL LACTATE 2 MG/ML PO CONC: 0.5000 mg | ORAL | Status: DC | PRN

## 2024-01-27 NOTE — ED Notes (Signed)
 [facility] called, no eta given

## 2024-01-27 NOTE — Care Management (Signed)
 [facility] called for transport to [facility]

## 2024-01-27 NOTE — ED Notes (Signed)
 This RN applied dressing to patients posterior head where staples are. Abd pad and gauze bandage applied.

## 2024-01-27 NOTE — Care Management Obs Status (Signed)
 MEDICARE OBSERVATION STATUS NOTIFICATION   Patient Details  Name: EAMONN SERMENO MRN: [id number] Date of Birth: 29-Dec-1940   Medicare Observation Status Notification Given:  Yes    Corean JAYSON Canary, RN 01/27/2024, 9:36 AM

## 2024-01-27 NOTE — Care Management (Signed)
 Transition of Care [facility]) - Inpatient Brief Assessment   Patient Details  Name: Seth Carter MRN: [id number] Date of Birth: September 08, 1940  Transition of Care [facility]) CM/SW Contact:    Corean JAYSON Canary, RN Phone Number: 01/27/2024, 9:53 AM   Clinical Narrative: Spoke at bedside with the patients daughter. He and his wife have lived in [place], He has had multiple falls. He has been followed by [facility] for hospice.She voiced that they are looking to get him in [facility]. Messaged with Team and [facility] liaison to see if there is a bed available   Transition of Care Asessment: Insurance and Status: Insurance coverage has been reviewed Patient has primary care physician: Yes Home environment has been reviewed: Lived at [employer] [employer] with wife Prior level of function:: Assisted   Social Drivers of Health Review: SDOH reviewed no interventions necessary Readmission risk has been reviewed: Yes Transition of care needs: transition of care needs identified, TOC will continue to follow

## 2024-01-27 NOTE — ED Notes (Signed)
 This RN and tech placed moved patient on to hospital bed for comfort.

## 2024-01-27 NOTE — ED Notes (Signed)
 This RN went to bedside to administer ativan  per daughter request because pt was waking up and pulling at things again. This RN gathered pt nighttime meds as well as the ativan . When at bedside, pt was noted to be resting comfortably and snoring lightly. This RN discussed holding ativan  until pt was agitated again. Daughter agreed to not giving it yet since pt was asleep again.

## 2024-01-27 NOTE — Consult Note (Signed)
 Palliative Medicine Inpatient Consult Note  Consulting Provider:  Franky Redia SAILOR, MD   Reason for consult:   Palliative Care Consult Services Palliative Medicine Consult   Symptom Management Consult  Reason for Consult? Symptom management.   01/27/2024  HPI:  Per intake H&P --> Seth Carter is a 83 y.o. male with history of Parkinson's disease, dementia, orthostatic hypotension has been having recurrent falls over the last few weeks.  Patient was initially brought to the ER at [facility] after a fall this morning and had staples placed on the scalp and was discharged following which he had another fall and was brought again.  This time patient's family agreed for scans.  Identified to have a subarachnoid hemorrhage and rib fracture.  Palliative care consulted for additional goals of care conversations.  Seth Carter is established with [facility] hospice support.  Clinical Assessment/Goals of Care:  *Please note that this is a verbal dictation therefore any spelling or grammatical errors are due to the Dragon Medical One system interpretation.  I have reviewed medical records including EPIC notes, labs and imaging, received report from bedside RN, assessed the patient who is lying in bed, resting.    I met with patient's daughter Avelina and Nathanel to further discuss diagnosis prognosis, GOC, EOL wishes, disposition and options.   I introduced Palliative Medicine as specialized medical care for people living with serious illness. It focuses on providing relief from the symptoms and stress of a serious illness. The goal is to improve quality of life for both the patient and the family.  Medical History Review and Understanding:  A review of Seth Carter's past medical history significant for dementia, Parkinson's disease, orthostasis, degenerative disc disease, glaucoma, GERD, osteoarthritis was complete.  Social History:  Prior to hospitalization Seth Carter had been living at the  [facility] where he received 24/7 care.  He is still married though his wife suffers from advanced dementia.  He has 3 children.  He is retired.  Functional and Nutritional State:  Seth Carter receives 24/7 care at his assisted living facility.  He has been falling persistently requiring ER visits.  His appetite has been depleting for the past week and a half or so  Advance Directives:  A detailed discussion was had today regarding advanced directives.  Seth Carter does have advanced directives on file designating his daughters to be his surrogate decision makers.  Code Status:  Concepts specific to code status, artifical feeding and hydration, continued IV antibiotics and rehospitalization was had.  The difference between a aggressive medical intervention path  and a palliative comfort care path for this patient at this time was had.   Patient is an established DO NOT RESUSCITATE DO NOT INTUBATE CODE STATUS.  Discussion:  Patient's daughters and I reviewed that over the past 6 months, Seth Carter's condition has been worsening.  He has end-stage Parkinson's disease and has been having difficulty with mobility, persistent falls, memory, and nutrition.  We reviewed the process of Parkinson's disease and when we get to the final phases what that entails.  Patient's daughters feel that Seth Carter has no quality of life.  We reviewed options moving forward inclusive of comfort care and inpatient hospice placement.  We talked about transition to comfort measures in house and what that would entail inclusive of medications to control pain, dyspnea, agitation, nausea, itching, and hiccups.  We discussed stopping all uneccessary measures such as cardiac monitoring, blood draws, needle sticks, and frequent vital signs.   We discussed initiating Ativan  and Dilaudid   around-the-clock for both patient's agitation and pain which patient's daughters were in agreement with.  Utilized reflective listening throughout our time together.    Discussed the importance of continued conversation with family and their  medical providers regarding overall plan of care and treatment options, ensuring decisions are within the context of the patients values and GOCs.  Decision Maker: Gray,Patricia (Daughter): [phone] (Mobile)   SUMMARY OF RECOMMENDATIONS   DNAR/DNI  Comfort Care  Will initiate low dose ativan  and dilaudid  ATC  Additional comfort medications per MAR  Appreciate TOC and [facility] looking into placement at [facility] versus [facility] home  Ongoing PMT support   Code Status/Advance Care Planning: DNAR/DNI   Palliative Prophylaxis:  Aspiration, Bowel Regimen, Delirium Protocol, Frequent Pain Assessment, Oral Care, Palliative Wound Care, and Turn Reposition  Additional Recommendations (Limitations, Scope, Preferences): Continue current care  Psycho-social/Spiritual:  Desire for further Chaplaincy support: No Additional Recommendations: Education on disease progression   Prognosis: Limited to week(s)  Discharge Planning: Discharge to hospice home.   Vitals:   01/27/24 0700 01/27/24 0908  BP: 130/69   Pulse: 72   Resp: 15   Temp:  97.9 F (36.6 C)  SpO2: 100%    No intake or output data in the 24 hours ending 01/27/24 0940  Gen:  Elderly Caucasian M chronically ill appearing HEENT: moist mucous membranes CV: Regular rate and rhythm PULM: Breathing is comfortable and nonlabored ABD: soft/nontender  EXT: No edema  Neuro: Alert and oriented x3   PPS: 30%   This conversation/these recommendations were discussed with patient primary care team, Dr. Raenelle ______________________________________________________ Rosaline Becton [facility] Palliative Medicine Team Team Cell Phone: [phone] Please utilize secure chat with additional questions, if there is no response within 30 minutes please call the above phone number  Total Time: 75 Billing based on MDM:  High  Palliative Medicine Team providers are available by phone from 7am to 7pm daily and can be reached through the team cell phone.  Should this patient require assistance outside of these hours, please call the patient's attending physician.

## 2024-01-27 NOTE — Discharge Summary (Signed)
 Physician Discharge Summary  Seth Carter FMW:[id number] DOB: 1941/05/07 DOA: 01/26/2024  PCP: Amin, Saad, MD  Admit date: 01/26/2024 Discharge date: 01/27/2024  Admitted From: Memory care unit Disposition: Inpatient hospice   Discharge Condition: Fair CODE STATUS: Comfort care Diet recommendation: As tolerated  Discharge summary: 83 year old with history of Parkinson's disease and dementia, orthostatic hypotension, recurrent fall currently at memory care unit, progressive worsening symptoms with agitation, not eating and failure to thrive brought back to the emergency room with another fall.  In the emergency room with skeletal survey positive for right frontal subarachnoid hemorrhage and chronic subdural hematomas.  Right sided 12th rib fracture and lung contusion.  Seen by neurosurgery.  Patient with end-of-life, advanced dementia from Parkinson's, multiple painful conditions and failure to thrive ultimate decision was made to set her up with comfort care and hospice.  Patient was provided comfort care measures.  He is transferring today to inpatient hospice to provide end-of-life care. Patient is not safe to take any medication by mouth.  Will need IV line to continue to provide care at hospice facility. Stable for transfer in an ambulance. Will be medicated for comfort before transferring out.  Discharge Diagnoses:  Principal Problem:   Falls Active Problems:   Essential hypertension   Parkinson's disease (HCC)   Orthostatic hypotension   Anemia   Rib fracture   SAH (subarachnoid hemorrhage) (HCC)   SDH (subdural hematoma) (HCC)    Discharge Instructions   Allergies as of 01/27/2024       Reactions   Cialis [tadalafil] Other (See Comments)   Severe back pain   Pneumovax [pneumococcal Polysaccharide Vaccine] Swelling, Other (See Comments)   Arm swelling and pain   Polysaccharide Iron Forte [iron Polysacch [id number]]    Unknown reaction per MAR   Zocor [simvastatin] Other  (See Comments)   Nightmares   Ace Inhibitors Cough   Contrast Media [iodinated Contrast Media] Itching, Rash, Other (See Comments)   Possible delayed reaction = all-over body rash and seizures that began after this was used        Medication List     STOP taking these medications    acetaminophen  325 MG tablet Commonly known as: TYLENOL    carbidopa -levodopa  25-100 MG tablet Commonly known as: SINEMET  IR   cetirizine 10 MG tablet Commonly known as: ZYRTEC   divalproex  125 MG capsule Commonly known as: DEPAKOTE  SPRINKLE   docusate sodium  100 MG capsule Commonly known as: COLACE   dorzolamide -timolol  2-0.5 % ophthalmic solution Commonly known as: COSOPT    droxidopa  300 MG Caps Commonly known as: NORTHERA    fludrocortisone  0.1 MG tablet Commonly known as: FLORINEF    latanoprost  0.005 % ophthalmic solution Commonly known as: XALATAN    LORazepam  0.5 MG tablet Commonly known as: ATIVAN    midodrine  5 MG tablet Commonly known as: PROAMATINE    mirabegron  ER 50 MG Tb24 tablet Commonly known as: MYRBETRIQ    oxybutynin  5 MG tablet Commonly known as: DITROPAN    PHENobarbital  32.4 MG tablet Commonly known as: LUMINAL   prednisoLONE  acetate 1 % ophthalmic suspension Commonly known as: PRED FORTE    QUEtiapine 25 MG tablet Commonly known as: SEROQUEL   QUEtiapine 50 MG tablet Commonly known as: SEROQUEL   rosuvastatin  10 MG tablet Commonly known as: CRESTOR    sertraline  25 MG tablet Commonly known as: ZOLOFT    sulfamethoxazole -trimethoprim  800-160 MG tablet Commonly known as: BACTRIM  DS        Allergies  Allergen Reactions   Cialis [Tadalafil] Other (See Comments)  Severe back pain   Pneumovax [Pneumococcal Polysaccharide Vaccine] Swelling and Other (See Comments)    Arm swelling and pain   Polysaccharide Iron Forte [Iron Polysacch Cmplx-B12-Fa]     Unknown reaction per MAR   Zocor [Simvastatin] Other (See Comments)    Nightmares    Ace  Inhibitors Cough   Contrast Media [Iodinated Contrast Media] Itching, Rash and Other (See Comments)    Possible delayed reaction = all-over body rash and seizures that began after this was used    Consultations: Neurosurgery Palliative Hospice   Procedures/Studies: CT HEAD WO CONTRAST Addendum Date: 01/26/2024 ADDENDUM REPORT: 01/26/2024 20:43 ADDENDUM: These results were called by telephone at the time of interpretation on 01/26/2024 at 8:42 pm to provider SCOTT ZACKOWSKI , who verbally acknowledged these results. Electronically Signed   By: Morgane  Naveau M.D.   On: 01/26/2024 20:43   Result Date: 01/26/2024 CLINICAL DATA:  Head trauma, moderate-severe; Polytrauma, blunt EXAM: CT HEAD WITHOUT CONTRAST CT CERVICAL SPINE WITHOUT CONTRAST TECHNIQUE: Multidetector CT imaging of the head and cervical spine was performed following the standard protocol without intravenous contrast. Multiplanar CT image reconstructions of the cervical spine were also generated. RADIATION DOSE REDUCTION: This exam was performed according to the departmental dose-optimization program which includes automated exposure control, adjustment of the mA and/or kV according to patient size and/or use of iterative reconstruction technique. COMPARISON:  None Available. FINDINGS: CT HEAD FINDINGS Brain: Cerebral ventricle sizes are concordant with the degree of cerebral volume loss. Patchy and confluent areas of decreased attenuation are noted throughout the deep and periventricular white matter of the cerebral hemispheres bilaterally, compatible with chronic microvascular ischemic disease. No evidence of large-territorial acute infarction. No parenchymal hemorrhage. No mass lesion. Acute small right frontal subarachnoid hemorrhage (2:26, 28; 7:125). Chronic 3 mm right subdural hematoma. No mass effect or midline shift. No hydrocephalus. Basilar cisterns are patent. Vascular: No hyperdense vessel. Skull: No acute fracture or focal lesion.  Sinuses/Orbits: Paranasal sinuses and mastoid air cells are clear. The orbits are unremarkable. Other: Midline posterior scalp subcutaneus soft tissue edema and emphysema with overlying skin staples. CT CERVICAL SPINE FINDINGS Alignment: Normal. Skull base and vertebrae: Multilevel moderate severe degenerative changes of the spine. No associated severe osseous neural foraminal or central canal stenosis. No acute fracture. No aggressive appearing focal osseous lesion or focal pathologic process. Soft tissues and spinal canal: No prevertebral fluid or swelling. No visible canal hematoma. Upper chest: Unremarkable. Other: Right atherosclerotic plaque of the aortic arch and its main branches. IMPRESSION: 1. Small right frontal subarachnoid hemorrhage. 2. Chronic 3 mm right subdural hematoma. 3. No acute displaced fracture or traumatic listhesis of the cervical spine. 4.  Aortic Atherosclerosis (ICD10-I70.0). Electronically Signed: By: Morgane  Naveau M.D. On: 01/26/2024 19:52   CT CERVICAL SPINE WO CONTRAST Addendum Date: 01/26/2024 ADDENDUM REPORT: 01/26/2024 20:43 ADDENDUM: These results were called by telephone at the time of interpretation on 01/26/2024 at 8:42 pm to provider SCOTT ZACKOWSKI , who verbally acknowledged these results. Electronically Signed   By: Morgane  Naveau M.D.   On: 01/26/2024 20:43   Result Date: 01/26/2024 CLINICAL DATA:  Head trauma, moderate-severe; Polytrauma, blunt EXAM: CT HEAD WITHOUT CONTRAST CT CERVICAL SPINE WITHOUT CONTRAST TECHNIQUE: Multidetector CT imaging of the head and cervical spine was performed following the standard protocol without intravenous contrast. Multiplanar CT image reconstructions of the cervical spine were also generated. RADIATION DOSE REDUCTION: This exam was performed according to the departmental dose-optimization program which includes automated exposure control, adjustment of  the mA and/or kV according to patient size and/or use of iterative  reconstruction technique. COMPARISON:  None Available. FINDINGS: CT HEAD FINDINGS Brain: Cerebral ventricle sizes are concordant with the degree of cerebral volume loss. Patchy and confluent areas of decreased attenuation are noted throughout the deep and periventricular white matter of the cerebral hemispheres bilaterally, compatible with chronic microvascular ischemic disease. No evidence of large-territorial acute infarction. No parenchymal hemorrhage. No mass lesion. Acute small right frontal subarachnoid hemorrhage (2:26, 28; 7:125). Chronic 3 mm right subdural hematoma. No mass effect or midline shift. No hydrocephalus. Basilar cisterns are patent. Vascular: No hyperdense vessel. Skull: No acute fracture or focal lesion. Sinuses/Orbits: Paranasal sinuses and mastoid air cells are clear. The orbits are unremarkable. Other: Midline posterior scalp subcutaneus soft tissue edema and emphysema with overlying skin staples. CT CERVICAL SPINE FINDINGS Alignment: Normal. Skull base and vertebrae: Multilevel moderate severe degenerative changes of the spine. No associated severe osseous neural foraminal or central canal stenosis. No acute fracture. No aggressive appearing focal osseous lesion or focal pathologic process. Soft tissues and spinal canal: No prevertebral fluid or swelling. No visible canal hematoma. Upper chest: Unremarkable. Other: Right atherosclerotic plaque of the aortic arch and its main branches. IMPRESSION: 1. Small right frontal subarachnoid hemorrhage. 2. Chronic 3 mm right subdural hematoma. 3. No acute displaced fracture or traumatic listhesis of the cervical spine. 4.  Aortic Atherosclerosis (ICD10-I70.0). Electronically Signed: By: Morgane  Naveau M.D. On: 01/26/2024 19:52   CT CHEST ABDOMEN PELVIS WO CONTRAST Result Date: 01/26/2024 CLINICAL DATA:  Poly trauma, blunt due to a fall. EXAM: CT CHEST, ABDOMEN AND PELVIS WITHOUT CONTRAST TECHNIQUE: Multidetector CT imaging of the chest, abdomen  and pelvis was performed following the standard protocol without IV contrast. RADIATION DOSE REDUCTION: This exam was performed according to the departmental dose-optimization program which includes automated exposure control, adjustment of the mA and/or kV according to patient size and/or use of iterative reconstruction technique. COMPARISON:  Chest radiograph 01/26/2024. Pelvic radiographs 01/26/2024. CT chest abdomen and pelvis 10/08/2023 FINDINGS: CT CHEST FINDINGS Cardiovascular: Normal heart size. No pericardial effusions. Normal caliber thoracic aorta. Calcification of the aorta and coronary arteries. Mediastinum/Nodes: Esophagus is decompressed. No significant lymphadenopathy. Thyroid  gland is unremarkable. No mediastinal infiltration or collection. Lungs/Pleura: Motion artifact limits evaluation. Consolidation in both lung bases could be due to pneumonia or compressive atelectasis. Pulmonary contusions would also be a possibility in the setting of trauma. No pleural effusion or pneumothorax. Musculoskeletal: Degenerative changes in the spine and shoulders. Nondisplaced fracture of the right posterior twelfth rib. No vertebral compression deformities. Sternum appears intact. CT ABDOMEN PELVIS FINDINGS Hepatobiliary: Noncontrast examination limits evaluation of solid organs. As visualized, the liver parenchyma is homogeneous without focal lesion identified. Gallbladder and bile ducts are normal. Pancreas: Unremarkable. No pancreatic ductal dilatation or surrounding inflammatory changes. Spleen: No splenic injury or perisplenic hematoma. Adrenals/Urinary Tract: No adrenal hemorrhage or renal injury identified. Bladder is unremarkable. Stomach/Bowel: Stomach is within normal limits. Appendix appears normal. No evidence of bowel wall thickening, distention, or inflammatory changes. Motion artifact limits evaluation. Vascular/Lymphatic: Aortic atherosclerosis. No enlarged abdominal or pelvic lymph nodes.  Reproductive: Prostate is unremarkable. Other: No abdominal wall hernia or abnormality. No abdominopelvic ascites. Musculoskeletal: Degenerative changes in the spine. Normal alignment. No vertebral compression deformities. Degenerative changes in the hips. No acute displaced fractures are identified. IMPRESSION: 1. Nondisplaced fracture of the right posterior twelfth rib. No other acute bony abnormalities. 2. Infiltration, atelectasis, or contusion demonstrated in both lung bases. No pleural  effusion or pneumothorax. 3. No evidence of mediastinal injury, solid organ injury, or bowel perforation on noncontrast imaging. 4. Aortic atherosclerosis. 5. Degenerative changes in the spine. Electronically Signed   By: Elsie Gravely M.D.   On: 01/26/2024 19:58   DG Pelvis Portable Result Date: 01/26/2024 CLINICAL DATA:  Trauma, fall EXAM: PORTABLE PELVIS 1-2 VIEWS COMPARISON:  10/08/2023 FINDINGS: Mild symmetric degenerative changes in the hips with joint space narrowing and spurring. SI joints symmetric and unremarkable. No acute bony abnormality. Specifically, no fracture, subluxation, or dislocation. IMPRESSION: No acute bony abnormality. Mild symmetric degenerative changes in the hips. Electronically Signed   By: Franky Crease M.D.   On: 01/26/2024 18:28   DG Chest Port 1 View Result Date: 01/26/2024 CLINICAL DATA:  Trauma, fall EXAM: PORTABLE CHEST 1 VIEW COMPARISON:  10/08/2023 FINDINGS: Heart and mediastinal contours within normal limits. Right lung clear. Focal opacity at the left lung base could reflect atelectasis or infiltrate. No effusions or pneumothorax. No acute bony abnormality. IMPRESSION: Left lower lobe atelectasis or infiltrate. Electronically Signed   By: Franky Crease M.D.   On: 01/26/2024 18:27   (Echo, Carotid, EGD, Colonoscopy, ERCP)    Subjective: Patient seen in the morning rounds.  Daughter at the bedside.  Patient was restless and trying to pull on lines, looked uncomfortable.  Patient  was unable to interact.      Discharge Exam: Vitals:   01/27/24 0908 01/27/24 1122  BP:    Pulse:  69  Resp:    Temp: 97.9 F (36.6 C)   SpO2:  98%   Vitals:   01/27/24 0630 01/27/24 0700 01/27/24 0908 01/27/24 1122  BP:  130/69    Pulse: 70 72  69  Resp:  15    Temp:   97.9 F (36.6 C)   TempSrc:   Axillary   SpO2: 100% 100%  98%    General: Sick looking.  Frail and debilitated.  Looked uncomfortable before medications. Patient is lethargic, unable to interact.  Does not follow commands.  Gets uncomfortable on exam. Cardiovascular: RRR, S1/S2 +, no rubs, no gallops Respiratory: CTA bilaterally, no wheezing, no rhonchi, tender on palpation. Abdominal: Soft, NT, ND, bowel sounds + Extremities:   Patient has laceration repaired on his head.  Sutures intact. Multiple areas of ecchymosis.     The results of significant diagnostics from this hospitalization (including imaging, microbiology, ancillary and laboratory) are listed below for reference.     Microbiology: No results found for this or any previous visit (from the past 240 hours).   Labs: BNP (last 3 results) No results for input(s): BNP in the last 8760 hours. Basic Metabolic Panel: Recent Labs  Lab 01/26/24 1817 01/26/24 1833  NA 143 144  K 4.3 4.4  CL 105 106  CO2 29  --   GLUCOSE 176* 171*  BUN 44* 43*  CREATININE 0.94 0.90  CALCIUM  9.5  --    Liver Function Tests: Recent Labs  Lab 01/26/24 1817  AST 23  ALT 7  ALKPHOS 80  BILITOT 1.7*  PROT 6.6  ALBUMIN 3.4*   No results for input(s): LIPASE, AMYLASE in the last 168 hours. No results for input(s): AMMONIA in the last 168 hours. CBC: Recent Labs  Lab 01/26/24 1817 01/26/24 1833  WBC 16.8*  --   HGB 9.9* 10.2*  HCT 30.2* 30.0*  MCV 93.8  --   PLT 247  --    Cardiac Enzymes: No results for input(s): CKTOTAL, CKMB, CKMBINDEX, TROPONINI  in the last 168 hours. BNP: Invalid input(s): POCBNP CBG: No results  for input(s): GLUCAP in the last 168 hours. D-Dimer No results for input(s): DDIMER in the last 72 hours. Hgb A1c No results for input(s): HGBA1C in the last 72 hours. Lipid Profile No results for input(s): CHOL, HDL, LDLCALC, TRIG, CHOLHDL, LDLDIRECT in the last 72 hours. Thyroid  function studies No results for input(s): TSH, T4TOTAL, T3FREE, THYROIDAB in the last 72 hours.  Invalid input(s): FREET3 Anemia work up No results for input(s): VITAMINB12, FOLATE, FERRITIN, TIBC, IRON, RETICCTPCT in the last 72 hours. Urinalysis    Component Value Date/Time   COLORURINE AMBER (A) 04/08/2023 2005   APPEARANCEUR CLOUDY (A) 04/08/2023 2005   LABSPEC 1.018 04/08/2023 2005   PHURINE 7.0 04/08/2023 2005   GLUCOSEU NEGATIVE 04/08/2023 2005   HGBUR NEGATIVE 04/08/2023 2005   BILIRUBINUR NEGATIVE 04/08/2023 2005   KETONESUR 5 (A) 04/08/2023 2005   PROTEINUR 30 (A) 04/08/2023 2005   NITRITE NEGATIVE 04/08/2023 2005   LEUKOCYTESUR MODERATE (A) 04/08/2023 2005   Sepsis Labs Recent Labs  Lab 01/26/24 1817  WBC 16.8*   Microbiology No results found for this or any previous visit (from the past 240 hours).   Time coordinating discharge:  40 minutes  SIGNED:   Renato Applebaum, MD  [facility] 01/27/2024, 12:21 PM

## 2024-01-27 NOTE — Progress Notes (Signed)
 Seth Carter ED [facility] Liaison Note  Current [facility] patient from [facility], made comfort care today.   Met with patient and family in room to explain [facility] as well as process to get patient moved.   [facility] is able to accept patient today. Daughter Odetta accepted bed.  RN staff, you may call report at any time to [facility] @ [phone], room is assigned when report is called.   Please leave IV intact and send completed DNR with patient.   Updated attending and [facility] manager via [employer].  Thank you for the opportunity to participate in this patient's care   Nat Babe, BSN, RN Hospice Nurse Liaison [phone]

## 2024-01-27 NOTE — ED Notes (Addendum)
 This RN attempted to feed pt applesauce. Per daughter, pt takes pills crushed and in applesauce. Pt could not swallow applesauce, pt was just putting it in the back of his mouth. Dr.Kakrakandy made aware that pt would not be able to safely take medications since he will not swallow, no orders given to this RN.

## 2024-01-28 DIAGNOSIS — I10 Essential (primary) hypertension: Secondary | ICD-10-CM | POA: Diagnosis not present

## 2024-01-28 DIAGNOSIS — E785 Hyperlipidemia, unspecified: Secondary | ICD-10-CM | POA: Diagnosis not present

## 2024-01-28 DIAGNOSIS — I679 Cerebrovascular disease, unspecified: Secondary | ICD-10-CM | POA: Diagnosis not present

## 2024-01-28 DIAGNOSIS — F32A Depression, unspecified: Secondary | ICD-10-CM | POA: Diagnosis not present

## 2024-01-28 DIAGNOSIS — G4089 Other seizures: Secondary | ICD-10-CM | POA: Diagnosis not present

## 2024-01-28 DIAGNOSIS — G20C Parkinsonism, unspecified: Secondary | ICD-10-CM | POA: Diagnosis not present

## 2024-01-29 DIAGNOSIS — I10 Essential (primary) hypertension: Secondary | ICD-10-CM | POA: Diagnosis not present

## 2024-01-29 DIAGNOSIS — F32A Depression, unspecified: Secondary | ICD-10-CM | POA: Diagnosis not present

## 2024-01-29 DIAGNOSIS — E785 Hyperlipidemia, unspecified: Secondary | ICD-10-CM | POA: Diagnosis not present

## 2024-01-29 DIAGNOSIS — I679 Cerebrovascular disease, unspecified: Secondary | ICD-10-CM | POA: Diagnosis not present

## 2024-01-29 DIAGNOSIS — G20C Parkinsonism, unspecified: Secondary | ICD-10-CM | POA: Diagnosis not present

## 2024-01-29 DIAGNOSIS — G4089 Other seizures: Secondary | ICD-10-CM | POA: Diagnosis not present

## 2024-02-22 DEATH — deceased
